# Patient Record
Sex: Female | Born: 1945
Health system: Southern US, Community
[De-identification: ages and names within clinical notes are randomized; demographics above are authoritative.]

## PROBLEM LIST (undated history)

## (undated) DIAGNOSIS — F32A Depression, unspecified: Secondary | ICD-10-CM

## (undated) DIAGNOSIS — H269 Unspecified cataract: Secondary | ICD-10-CM

## (undated) DIAGNOSIS — F419 Anxiety disorder, unspecified: Secondary | ICD-10-CM

## (undated) DIAGNOSIS — J45909 Unspecified asthma, uncomplicated: Secondary | ICD-10-CM

## (undated) DIAGNOSIS — F329 Major depressive disorder, single episode, unspecified: Secondary | ICD-10-CM

## (undated) DIAGNOSIS — G709 Myoneural disorder, unspecified: Secondary | ICD-10-CM

## (undated) DIAGNOSIS — I10 Essential (primary) hypertension: Secondary | ICD-10-CM

## (undated) DIAGNOSIS — Z8619 Personal history of other infectious and parasitic diseases: Secondary | ICD-10-CM

## (undated) DIAGNOSIS — T7840XA Allergy, unspecified, initial encounter: Secondary | ICD-10-CM

## (undated) DIAGNOSIS — C801 Malignant (primary) neoplasm, unspecified: Secondary | ICD-10-CM

## (undated) DIAGNOSIS — Z87440 Personal history of urinary (tract) infections: Secondary | ICD-10-CM

## (undated) DIAGNOSIS — E785 Hyperlipidemia, unspecified: Secondary | ICD-10-CM

## (undated) DIAGNOSIS — G4730 Sleep apnea, unspecified: Secondary | ICD-10-CM

## (undated) DIAGNOSIS — K219 Gastro-esophageal reflux disease without esophagitis: Secondary | ICD-10-CM

## (undated) HISTORY — DX: Depression, unspecified: F32.A

## (undated) HISTORY — DX: Personal history of urinary (tract) infections: Z87.440

## (undated) HISTORY — DX: Gastro-esophageal reflux disease without esophagitis: K21.9

## (undated) HISTORY — DX: Anxiety disorder, unspecified: F41.9

## (undated) HISTORY — PX: TUMOR REMOVAL: SHX12

## (undated) HISTORY — DX: Personal history of other infectious and parasitic diseases: Z86.19

## (undated) HISTORY — DX: Major depressive disorder, single episode, unspecified: F32.9

## (undated) HISTORY — PX: TONSILLECTOMY: SUR1361

## (undated) HISTORY — DX: Myoneural disorder, unspecified: G70.9

## (undated) HISTORY — DX: Essential (primary) hypertension: I10

## (undated) HISTORY — DX: Malignant (primary) neoplasm, unspecified: C80.1

## (undated) HISTORY — DX: Allergy, unspecified, initial encounter: T78.40XA

## (undated) HISTORY — DX: Unspecified asthma, uncomplicated: J45.909

## (undated) HISTORY — DX: Unspecified cataract: H26.9

## (undated) MED FILL — Iron Sucrose Inj 20 MG/ML (Fe Equiv): INTRAVENOUS | Qty: 10 | Status: AC

---

## 2004-03-07 ENCOUNTER — Ambulatory Visit: Payer: Self-pay | Admitting: Internal Medicine

## 2004-03-10 ENCOUNTER — Ambulatory Visit: Payer: Self-pay | Admitting: Internal Medicine

## 2004-11-21 ENCOUNTER — Ambulatory Visit: Payer: Self-pay | Admitting: Internal Medicine

## 2005-02-12 ENCOUNTER — Emergency Department: Payer: Self-pay | Admitting: Internal Medicine

## 2005-04-19 ENCOUNTER — Ambulatory Visit: Payer: Self-pay | Admitting: Internal Medicine

## 2005-08-28 ENCOUNTER — Ambulatory Visit: Payer: Self-pay | Admitting: Urology

## 2007-01-17 ENCOUNTER — Ambulatory Visit: Payer: Self-pay | Admitting: Internal Medicine

## 2007-01-28 ENCOUNTER — Ambulatory Visit: Payer: Self-pay | Admitting: Gastroenterology

## 2007-07-16 ENCOUNTER — Ambulatory Visit: Payer: Self-pay | Admitting: Urology

## 2007-08-27 ENCOUNTER — Ambulatory Visit: Payer: Self-pay | Admitting: Urology

## 2010-08-31 ENCOUNTER — Emergency Department: Payer: Self-pay | Admitting: Emergency Medicine

## 2010-08-31 LAB — HM PAP SMEAR: HM Pap smear: NEGATIVE

## 2010-09-09 ENCOUNTER — Ambulatory Visit: Payer: Self-pay | Admitting: Internal Medicine

## 2010-09-15 ENCOUNTER — Ambulatory Visit: Payer: Self-pay | Admitting: Internal Medicine

## 2011-10-04 ENCOUNTER — Ambulatory Visit: Payer: Self-pay | Admitting: Internal Medicine

## 2011-10-04 LAB — HM MAMMOGRAPHY

## 2012-12-13 ENCOUNTER — Encounter: Payer: Self-pay | Admitting: Internal Medicine

## 2012-12-13 ENCOUNTER — Ambulatory Visit (INDEPENDENT_AMBULATORY_CARE_PROVIDER_SITE_OTHER): Payer: 59 | Admitting: Internal Medicine

## 2012-12-13 VITALS — BP 110/70 | HR 92 | Temp 97.9°F | Ht 68.0 in | Wt 216.5 lb

## 2012-12-13 DIAGNOSIS — Z9109 Other allergy status, other than to drugs and biological substances: Secondary | ICD-10-CM

## 2012-12-13 DIAGNOSIS — N390 Urinary tract infection, site not specified: Secondary | ICD-10-CM

## 2012-12-13 DIAGNOSIS — F329 Major depressive disorder, single episode, unspecified: Secondary | ICD-10-CM

## 2012-12-13 DIAGNOSIS — I10 Essential (primary) hypertension: Secondary | ICD-10-CM

## 2012-12-13 MED ORDER — LISINOPRIL-HYDROCHLOROTHIAZIDE 10-12.5 MG PO TABS: 1.0000 | ORAL_TABLET | Freq: Every day | ORAL | Status: DC

## 2012-12-13 MED ORDER — SERTRALINE HCL 25 MG PO TABS: 25.0000 mg | ORAL_TABLET | Freq: Every day | ORAL | Status: DC

## 2012-12-13 MED ORDER — ESTRADIOL 0.1 MG/GM VA CREA: TOPICAL_CREAM | VAGINAL | Status: DC

## 2012-12-15 ENCOUNTER — Encounter: Payer: Self-pay | Admitting: Internal Medicine

## 2012-12-15 DIAGNOSIS — N390 Urinary tract infection, site not specified: Secondary | ICD-10-CM | POA: Insufficient documentation

## 2012-12-15 DIAGNOSIS — I10 Essential (primary) hypertension: Secondary | ICD-10-CM | POA: Insufficient documentation

## 2012-12-15 DIAGNOSIS — Z9109 Other allergy status, other than to drugs and biological substances: Secondary | ICD-10-CM | POA: Insufficient documentation

## 2012-12-15 DIAGNOSIS — F329 Major depressive disorder, single episode, unspecified: Secondary | ICD-10-CM | POA: Insufficient documentation

## 2012-12-15 LAB — BASIC METABOLIC PANEL
BUN/Creatinine Ratio: 17 (ref 11–26)
Calcium: 9.7 mg/dL (ref 8.6–10.2)
Creatinine, Ser: 0.93 mg/dL (ref 0.57–1.00)
GFR calc non Af Amer: 64 mL/min/{1.73_m2} (ref >59)
Sodium: 145 mmol/L — ABNORMAL HIGH (ref 134–144)

## 2012-12-15 NOTE — Assessment & Plan Note (Signed)
Blood pressure under good control on current regimen.  Check metabolic panel.

## 2012-12-15 NOTE — Assessment & Plan Note (Signed)
Stable

## 2012-12-15 NOTE — Progress Notes (Signed)
  Subjective:    [name], female    DOB: Feb 03, 1946, 67 y.o.   MRN: [id number]  HPI 67 year old female with past history of hypertension and depression who comes in today to follow up on these issues as well as to transfer her care here to [facility]  She states overall she is doing relatively well.  Has been off her zoloft for approximately 6 months.   Feels she did better on the zoloft. Has also been out of her estrogen cream.  States, while using the cream she did not have the frequent urinary tract infections.  Now that she is off, has had some reoccurring uti's.  Is on cipro now for uti.  No acute symptoms now (reported).  Breathing stable.  Overall she feels she is doing relatively well.     Past Medical History  Diagnosis Date  . Depression   . Allergy   . Hypertension   . History of chicken pox   . Hx: UTI (urinary tract infection)     Review of Systems Patient denies any headache, lightheadedness or dizziness. No sinus or allergy symptoms.   No chest pain, tightness or palpitations.  No increased shortness of breath, cough or congestion.  No nausea or vomiting.  No acid reflux.  No abdominal pain or cramping.  No bowel change, such as diarrhea, constipation, BRBPR or melana.  No urine change.  Feels she needs to be back on her zoloft.  Also with reoccurring uti's.  Currently being treated.       Objective:   Physical Exam Filed Vitals:   12/13/12 1420  BP: 110/70  Pulse: 92  Temp: 97.9 F (61.55 C)   68 year old female in no acute distress.   HEENT:  Nares- clear.  Oropharynx - without lesions. NECK:  Supple.  Nontender.  No audible bruit.  HEART:  Appears to be regular. LUNGS:  No crackles or wheezing audible.  Respirations even and unlabored.  RADIAL PULSE:  Equal bilaterally.  ABDOMEN:  Soft, nontender.  Bowel sounds present and normal.  No audible abdominal bruit.   EXTREMITIES:  No increased edema present.  DP pulses palpable and equal bilaterally.           Assessment & Plan:  HEALTH MAINTENANCE.  Schedule her for a physical next visit.  Schedule mammogram when due.  Obtain outside records for review.  Cholesterol just checked 11/19/12 revealed total cholesterol 170, triglycerides 86, HDL 52 and LDL 101.

## 2012-12-15 NOTE — Assessment & Plan Note (Signed)
Restart zoloft.  Follow.  Did well previously.

## 2012-12-15 NOTE — Assessment & Plan Note (Signed)
On cipro now.  Restart estrace cream.   Follow.

## 2012-12-17 ENCOUNTER — Encounter: Payer: Self-pay | Admitting: *Deleted

## 2012-12-19 ENCOUNTER — Encounter: Payer: Self-pay | Admitting: Internal Medicine

## 2012-12-20 ENCOUNTER — Other Ambulatory Visit: Payer: Self-pay | Admitting: *Deleted

## 2013-01-27 ENCOUNTER — Encounter: Payer: Self-pay | Admitting: Internal Medicine

## 2013-01-29 ENCOUNTER — Ambulatory Visit: Payer: Self-pay | Admitting: Internal Medicine

## 2013-01-29 LAB — HM MAMMOGRAPHY

## 2013-02-07 ENCOUNTER — Encounter: Payer: Self-pay | Admitting: Internal Medicine

## 2013-02-08 ENCOUNTER — Other Ambulatory Visit: Payer: Self-pay | Admitting: Internal Medicine

## 2013-02-10 NOTE — Telephone Encounter (Signed)
Rx was sent to [facility] on 12/13/12- #90 with 1 refill

## 2013-02-24 ENCOUNTER — Encounter: Payer: Self-pay | Admitting: Internal Medicine

## 2013-02-24 ENCOUNTER — Ambulatory Visit (INDEPENDENT_AMBULATORY_CARE_PROVIDER_SITE_OTHER): Payer: 59 | Admitting: Internal Medicine

## 2013-02-24 VITALS — BP 110/70 | HR 79 | Temp 97.9°F | Wt 215.5 lb

## 2013-02-24 DIAGNOSIS — I10 Essential (primary) hypertension: Secondary | ICD-10-CM

## 2013-02-24 DIAGNOSIS — M25569 Pain in unspecified knee: Secondary | ICD-10-CM

## 2013-02-24 DIAGNOSIS — M7989 Other specified soft tissue disorders: Secondary | ICD-10-CM

## 2013-02-24 DIAGNOSIS — Z9109 Other allergy status, other than to drugs and biological substances: Secondary | ICD-10-CM

## 2013-02-24 DIAGNOSIS — N390 Urinary tract infection, site not specified: Secondary | ICD-10-CM

## 2013-02-24 DIAGNOSIS — F329 Major depressive disorder, single episode, unspecified: Secondary | ICD-10-CM

## 2013-02-24 DIAGNOSIS — M25562 Pain in left knee: Secondary | ICD-10-CM

## 2013-02-24 DIAGNOSIS — Z23 Encounter for immunization: Secondary | ICD-10-CM

## 2013-03-01 ENCOUNTER — Encounter: Payer: Self-pay | Admitting: Internal Medicine

## 2013-03-01 DIAGNOSIS — M7989 Other specified soft tissue disorders: Secondary | ICD-10-CM | POA: Insufficient documentation

## 2013-03-01 DIAGNOSIS — M25562 Pain in left knee: Secondary | ICD-10-CM | POA: Insufficient documentation

## 2013-03-01 NOTE — Assessment & Plan Note (Signed)
Better now.  Follow.  

## 2013-03-01 NOTE — Assessment & Plan Note (Signed)
On estrace cream.  This helps decrease her uti's.  Currently asymptomatic.

## 2013-03-01 NOTE — Progress Notes (Signed)
  Subjective:    [name], female    DOB: 1946/03/31, 67 y.o.   MRN: [id number]  HPI 67 year old female with past history of hypertension and depression who comes in today to follow up on these issues as well as for a complete physical exam.  She states overall she is doing relatively well.  Breathing stable.  No chest pain or tightness.  No acid reflux.  She noticed some bilateral leg swelling previously.  Better now.  Worse after she has been standing for awhile.  Has noticed some intermittent left knee pain.  Better now.  Discussed support hose.  Bowels stable.     Past Medical History  Diagnosis Date  . Depression   . Allergy   . Hypertension   . History of chicken pox   . Hx: UTI (urinary tract infection)     Current Outpatient Prescriptions on File Prior to Visit  Medication Sig Dispense Refill  . estradiol (ESTRACE) 0.1 MG/GM vaginal cream Insert twice a week  42.5 g  1  . lisinopril-hydrochlorothiazide (PRINZIDE,ZESTORETIC) 10-12.5 MG per tablet Take 1 tablet by mouth daily.  90 tablet  1  . sertraline (ZOLOFT) 25 MG tablet Take 1 tablet (25 mg total) by mouth daily.  90 tablet  1   No current facility-administered medications on file prior to visit.    Review of Systems Patient denies any headache, lightheadedness or dizziness. No sinus or allergy symptoms.   No chest pain, tightness or palpitations.  No increased shortness of breath, cough or congestion.  No nausea or vomiting.  No acid reflux.  No abdominal pain or cramping.  No bowel change, such as diarrhea, constipation, BRBPR or melana.  No urine change.  Back on her zoloft and doing well with this.  Some leg swelling - bilateral lower extremities.         Objective:   Physical Exam  Filed Vitals:   02/24/13 1549  BP: 110/70  Pulse: 79  Temp: 97.9 F (54.61 C)   67 year old female in no acute distress.   HEENT:  Nares- clear.  Oropharynx - without lesions. NECK:  Supple.  Nontender.  No audible  bruit.  HEART:  Appears to be regular. LUNGS:  No crackles or wheezing audible.  Respirations even and unlabored.  RADIAL PULSE:  Equal bilaterally.    BREASTS:  No nipple discharge or nipple retraction present.  Could not appreciate any distinct nodules or axillary adenopathy.  ABDOMEN:  Soft, nontender.  Bowel sounds present and normal.  No audible abdominal bruit.  GU: not performed.   EXTREMITIES:  No increased edema present.  DP pulses palpable and equal bilaterally.          Assessment & Plan:  HEALTH MAINTENANCE.  Physical today.  Pap 08/31/10 - negative with negative HPV.   Cholesterol last checked 11/19/12 revealed total cholesterol 170, triglycerides 86, HDL 52 and LDL 101.   Mammogram 01/29/13 - Birads I.

## 2013-03-01 NOTE — Assessment & Plan Note (Signed)
Blood pressure under good control on current regimen.  Follow metabolic panel.  

## 2013-03-01 NOTE — Assessment & Plan Note (Signed)
Bilateral lower extremity swelling.  Worse as the day progresses.  Overall better now.  Compression hose.  Follow.

## 2013-03-01 NOTE — Assessment & Plan Note (Signed)
On zoloft and doing well.  Follow.   

## 2013-03-01 NOTE — Assessment & Plan Note (Signed)
Stable

## 2013-05-13 ENCOUNTER — Ambulatory Visit (INDEPENDENT_AMBULATORY_CARE_PROVIDER_SITE_OTHER): Payer: 59 | Admitting: Internal Medicine

## 2013-05-13 ENCOUNTER — Encounter: Payer: Self-pay | Admitting: Internal Medicine

## 2013-05-13 VITALS — BP 120/80 | HR 94 | Temp 98.0°F | Ht 68.0 in | Wt 216.5 lb

## 2013-05-13 DIAGNOSIS — R05 Cough: Secondary | ICD-10-CM

## 2013-05-13 DIAGNOSIS — I10 Essential (primary) hypertension: Secondary | ICD-10-CM

## 2013-05-13 DIAGNOSIS — R059 Cough, unspecified: Secondary | ICD-10-CM

## 2013-05-13 DIAGNOSIS — K219 Gastro-esophageal reflux disease without esophagitis: Secondary | ICD-10-CM

## 2013-05-13 MED ORDER — RANITIDINE HCL 150 MG/10ML PO SYRP: 150.0000 mg | ORAL_SOLUTION | Freq: Two times a day (BID) | ORAL | Status: DC

## 2013-05-13 NOTE — Progress Notes (Signed)
Pre-visit discussion using our clinic review tool. No additional management support is needed unless otherwise documented below in the visit note.  

## 2013-05-18 ENCOUNTER — Encounter: Payer: Self-pay | Admitting: Internal Medicine

## 2013-05-18 DIAGNOSIS — R059 Cough, unspecified: Secondary | ICD-10-CM | POA: Insufficient documentation

## 2013-05-18 DIAGNOSIS — R05 Cough: Secondary | ICD-10-CM | POA: Insufficient documentation

## 2013-05-18 DIAGNOSIS — K219 Gastro-esophageal reflux disease without esophagitis: Secondary | ICD-10-CM | POA: Insufficient documentation

## 2013-05-18 NOTE — Progress Notes (Signed)
  Subjective:    [name], female    DOB: 1945/09/01, 68 y.o.   MRN: [id number]  Cough  68 year old female with past history of hypertension and depression who comes in today as a work in with concerns regarding increased cough.    She states overall has been doing relatively well.  Was diagnosed with bronchitis 6 weeks ago.  Was placed on amoxicillin.  Still with cough.  Feels better.  Minimal stuffiness.  No significant drainage.  Cough persistent.  Seldom productive.  Taking mucinex prn.  Does report increased acid reflux.  No vomiting.  Eating and drinking.    Past Medical History  Diagnosis Date  . Depression   . Allergy   . Hypertension   . History of chicken pox   . Hx: UTI (urinary tract infection)     Current Outpatient Prescriptions on File Prior to Visit  Medication Sig Dispense Refill  . estradiol (ESTRACE) 0.1 MG/GM vaginal cream Insert twice a week  42.5 g  1  . lisinopril-hydrochlorothiazide (PRINZIDE,ZESTORETIC) 10-12.5 MG per tablet Take 1 tablet by mouth daily.  90 tablet  1  . sertraline (ZOLOFT) 25 MG tablet Take 1 tablet (25 mg total) by mouth daily.  90 tablet  1   No current facility-administered medications on file prior to visit.    Review of Systems  Respiratory: Positive for cough.   Patient denies any headache, lightheadedness or dizziness.  Minimal nasal stuffiness.  No chest pain, tightness or palpitations.  No increased shortness of breath.  Does report some cough - mostly non productive.  No nausea or vomiting.  Acid reflux as outlined.   No abdominal pain or cramping.  No bowel change, such as diarrhea - reported.        Objective:   Physical Exam  Filed Vitals:   05/13/13 1529  BP: 120/80  Pulse: 94  Temp: 98 F (4036.707 C)   68 year old female in no acute distress.   HEENT:  Nares- clear.  Oropharynx - without lesions. NECK:  Supple.  Nontender.  No audible bruit.  HEART:  Appears to be regular. LUNGS:  No crackles or  wheezing audible.  Respirations even and unlabored.         Assessment & Plan:  HEALTH MAINTENANCE.  Physical 02/24/13.   Pap 08/31/10 - negative with negative HPV.   Cholesterol last checked 11/19/12 revealed total cholesterol 170, triglycerides 86, HDL 52 and LDL 101.   Mammogram 01/29/13 - Birads I.

## 2013-05-18 NOTE — Assessment & Plan Note (Signed)
Blood pressure under good control on current regimen.  Follow metabolic panel.  

## 2013-05-18 NOTE — Assessment & Plan Note (Signed)
Describes a persistent mostly dry cough.  May just be some residual cough from previous infection.  Possible allergy component.  Treat with saline nasal spray and Flonase as directed.  Also treat acid reflux as outlined.  Get her back in soon to reassess.  Lungs clear.  Hold on cxr.

## 2013-05-18 NOTE — Assessment & Plan Note (Signed)
Acid reflux as outlined.  I feel this may be contributing to the cough.  She is unable to swallow pills and request liquid form of the medication.  Will prescribe zantac suspension as directed.  Get her back in soon to reassess.

## 2013-05-29 ENCOUNTER — Ambulatory Visit: Payer: 59 | Admitting: Adult Health

## 2013-06-19 ENCOUNTER — Encounter: Payer: Self-pay | Admitting: Internal Medicine

## 2013-06-19 ENCOUNTER — Ambulatory Visit (INDEPENDENT_AMBULATORY_CARE_PROVIDER_SITE_OTHER): Payer: 59 | Admitting: Internal Medicine

## 2013-06-19 VITALS — BP 130/80 | HR 89 | Temp 97.6°F | Ht 68.0 in | Wt 211.5 lb

## 2013-06-19 DIAGNOSIS — R42 Dizziness and giddiness: Secondary | ICD-10-CM

## 2013-06-19 DIAGNOSIS — F3289 Other specified depressive episodes: Secondary | ICD-10-CM

## 2013-06-19 DIAGNOSIS — F32A Depression, unspecified: Secondary | ICD-10-CM

## 2013-06-19 DIAGNOSIS — Z9109 Other allergy status, other than to drugs and biological substances: Secondary | ICD-10-CM

## 2013-06-19 DIAGNOSIS — Z13220 Encounter for screening for lipoid disorders: Secondary | ICD-10-CM

## 2013-06-19 DIAGNOSIS — K219 Gastro-esophageal reflux disease without esophagitis: Secondary | ICD-10-CM

## 2013-06-19 DIAGNOSIS — M7989 Other specified soft tissue disorders: Secondary | ICD-10-CM

## 2013-06-19 DIAGNOSIS — I10 Essential (primary) hypertension: Secondary | ICD-10-CM

## 2013-06-19 DIAGNOSIS — F329 Major depressive disorder, single episode, unspecified: Secondary | ICD-10-CM

## 2013-06-19 MED ORDER — LANSOPRAZOLE 30 MG PO TBDP: 30.0000 mg | ORAL_TABLET | Freq: Every day | ORAL | Status: DC

## 2013-06-19 NOTE — Assessment & Plan Note (Addendum)
Blood pressure under good control on current regimen.  Follow metabolic panel.  

## 2013-06-19 NOTE — Assessment & Plan Note (Addendum)
On zoloft and doing well.  Follow.   

## 2013-06-19 NOTE — Progress Notes (Signed)
Pre-visit discussion using our clinic review tool. No additional management support is needed unless otherwise documented below in the visit note.  

## 2013-06-19 NOTE — Progress Notes (Signed)
  Subjective:    [name], female    DOB: March 14, 1946, 68 y.o.   MRN: [id number]  HPI 68 year old female with past history of hypertension and depression who comes in today for a scheduled follow up.  She states overall she is doing relatively well.  Breathing stable.  No chest pain or tightness.  Was having increased acid reflux.  Was started on ranitidine.  Symptoms improved but still present.  Also, if she skipped her medicine - would have a flare.  No abdominal pain or cramping.   Bowels stable.  Some occasional light headedness.  Some allergy symptoms.    Past Medical History  Diagnosis Date  . Depression   . Allergy   . Hypertension   . History of chicken pox   . Hx: UTI (urinary tract infection)     Current Outpatient Prescriptions on File Prior to Visit  Medication Sig Dispense Refill  . estradiol (ESTRACE) 0.1 MG/GM vaginal cream Insert twice a week  42.5 g  1  . lisinopril-hydrochlorothiazide (PRINZIDE,ZESTORETIC) 10-12.5 MG per tablet Take 1 tablet by mouth daily.  90 tablet  1  . ranitidine (ZANTAC) 150 MG/10ML syrup Take 10 mLs (150 mg total) by mouth 2 (two) times daily.  300 mL  0  . sertraline (ZOLOFT) 25 MG tablet Take 1 tablet (25 mg total) by mouth daily.  90 tablet  1   No current facility-administered medications on file prior to visit.    Review of Systems Patient denies any headache.  Some light headedness.  Minimal.  See above.  No chest pain, tightness or palpitations.  No increased shortness of breath, cough or congestion.  No nausea or vomiting.  Acid reflux as outlined.   No abdominal pain or cramping.  No bowel change, such as diarrhea, constipation, BRBPR or melana.  No urine change.  Back on her zoloft and doing well with this.         Objective:   Physical Exam  Filed Vitals:   06/19/13 1531  BP: 130/80  Pulse: 89  Temp: 97.6 F (36.4 C)   Blood pressure recheck:  31128/6378 68 year old female in no acute distress.   HEENT:  Nares-  clear.  Oropharynx - without lesions. NECK:  Supple.  Nontender.  No audible bruit.  HEART:  Appears to be regular. LUNGS:  No crackles or wheezing audible.  Respirations even and unlabored.  RADIAL PULSE:  Equal bilaterally.  ABDOMEN:  Soft, nontender.  Bowel sounds present and normal.  No audible abdominal bruit.  EXTREMITIES:  No increased edema present.  DP pulses palpable and equal bilaterally.          Assessment & Plan:  HEALTH MAINTENANCE.  Physical 02/24/13.   Pap 08/31/10 - negative with negative HPV.   Cholesterol last checked 11/19/12 revealed total cholesterol 170, triglycerides 86, HDL 52 and LDL 101.   Mammogram 01/29/13 - Birads I.

## 2013-06-19 NOTE — Assessment & Plan Note (Addendum)
Acid reflux as outlined.  Cough better.   She is unable to swallow pills.  Will start prevacid solutabs.   Ranitidine in the evening.  Follow.  Discussed possible need for EGD if symptoms persist.

## 2013-06-22 ENCOUNTER — Encounter: Payer: Self-pay | Admitting: Internal Medicine

## 2013-06-22 DIAGNOSIS — R42 Dizziness and giddiness: Secondary | ICD-10-CM | POA: Insufficient documentation

## 2013-06-22 NOTE — Assessment & Plan Note (Addendum)
Start zyrtec suspension.

## 2013-06-22 NOTE — Assessment & Plan Note (Signed)
Will treat allergies.  Follow.  Blood pressure good.  Will hold on further w/up.  She is in agreement.  Follow.  If persistent, will require further w/up.

## 2013-06-22 NOTE — Assessment & Plan Note (Signed)
Better.  Follow.  

## 2013-06-30 ENCOUNTER — Encounter: Payer: Self-pay | Admitting: Internal Medicine

## 2013-06-30 ENCOUNTER — Telehealth: Payer: Self-pay | Admitting: *Deleted

## 2013-06-30 NOTE — Telephone Encounter (Signed)
Received PA request form for the Prevacid 30 mg, form placed in Dr.scott folder

## 2013-07-02 NOTE — Telephone Encounter (Signed)
PA Request Form completed & faxed to [phone]

## 2013-07-07 ENCOUNTER — Other Ambulatory Visit: Payer: Self-pay | Admitting: *Deleted

## 2013-07-07 MED ORDER — LISINOPRIL-HYDROCHLOROTHIAZIDE 10-12.5 MG PO TABS: 1.0000 | ORAL_TABLET | Freq: Every day | ORAL | Status: DC

## 2013-07-07 MED ORDER — SERTRALINE HCL 25 MG PO TABS: 25.0000 mg | ORAL_TABLET | Freq: Every day | ORAL | Status: DC

## 2013-07-08 ENCOUNTER — Other Ambulatory Visit: Payer: Self-pay | Admitting: *Deleted

## 2013-08-11 ENCOUNTER — Other Ambulatory Visit (INDEPENDENT_AMBULATORY_CARE_PROVIDER_SITE_OTHER): Payer: 59

## 2013-08-11 ENCOUNTER — Encounter: Payer: Self-pay | Admitting: Internal Medicine

## 2013-08-11 DIAGNOSIS — F3289 Other specified depressive episodes: Secondary | ICD-10-CM

## 2013-08-11 DIAGNOSIS — Z13220 Encounter for screening for lipoid disorders: Secondary | ICD-10-CM

## 2013-08-11 DIAGNOSIS — F32A Depression, unspecified: Secondary | ICD-10-CM

## 2013-08-11 DIAGNOSIS — I10 Essential (primary) hypertension: Secondary | ICD-10-CM

## 2013-08-11 DIAGNOSIS — R42 Dizziness and giddiness: Secondary | ICD-10-CM

## 2013-08-11 DIAGNOSIS — F329 Major depressive disorder, single episode, unspecified: Secondary | ICD-10-CM

## 2013-08-11 LAB — COMPREHENSIVE METABOLIC PANEL WITH GFR
ALT: 14 U/L (ref 0–35)
AST: 21 U/L (ref 0–37)
Albumin: 4 g/dL (ref 3.5–5.2)
Alkaline Phosphatase: 58 U/L (ref 39–117)
BUN: 11 mg/dL (ref 6–23)
CO2: 28 meq/L (ref 19–32)
Calcium: 10 mg/dL (ref 8.4–10.5)
Chloride: 106 meq/L (ref 96–112)
Creatinine, Ser: 0.9 mg/dL (ref 0.4–1.2)
GFR: 70.81 mL/min (ref >60.00)
Glucose, Bld: 99 mg/dL (ref 70–99)
Potassium: 4.4 meq/L (ref 3.5–5.1)
Sodium: 142 meq/L (ref 135–145)
Total Bilirubin: 0.4 mg/dL (ref 0.3–1.2)
Total Protein: 7.1 g/dL (ref 6.0–8.3)

## 2013-08-11 LAB — CBC WITH DIFFERENTIAL/PLATELET
Basophils Absolute: 0 K/uL (ref 0.0–0.1)
Basophils Relative: 0.3 % (ref 0.0–3.0)
Eosinophils Absolute: 0.2 K/uL (ref 0.0–0.7)
Eosinophils Relative: 3 % (ref 0.0–5.0)
HCT: 36.7 % (ref 36.0–46.0)
Hemoglobin: 12.4 g/dL (ref 12.0–15.0)
Lymphocytes Relative: 44.3 % (ref 12.0–46.0)
Lymphs Abs: 2.4 K/uL (ref 0.7–4.0)
MCHC: 33.6 g/dL (ref 30.0–36.0)
MCV: 91 fl (ref 78.0–100.0)
Monocytes Absolute: 0.4 K/uL (ref 0.1–1.0)
Monocytes Relative: 7.9 % (ref 3.0–12.0)
Neutro Abs: 2.4 K/uL (ref 1.4–7.7)
Neutrophils Relative %: 44.5 % (ref 43.0–77.0)
Platelets: 239 K/uL (ref 150.0–400.0)
RBC: 4.04 Mil/uL (ref 3.87–5.11)
RDW: 13.2 % (ref 11.5–14.6)
WBC: 5.4 K/uL (ref 4.5–10.5)

## 2013-08-11 LAB — LIPID PANEL
CHOLESTEROL: 182 mg/dL (ref 0–200)
HDL: 45.4 mg/dL (ref >39.00)
LDL Cholesterol: 119 mg/dL — ABNORMAL HIGH (ref 0–99)
TRIGLYCERIDES: 89 mg/dL (ref 0.0–149.0)
Total CHOL/HDL Ratio: 4
VLDL: 17.8 mg/dL (ref 0.0–40.0)

## 2013-08-11 LAB — TSH: TSH: 2.38 u[IU]/mL (ref 0.35–5.50)

## 2013-08-12 ENCOUNTER — Other Ambulatory Visit: Payer: 59

## 2013-08-13 NOTE — Telephone Encounter (Signed)
Unread mychart message mailed  

## 2013-08-15 ENCOUNTER — Encounter: Payer: Self-pay | Admitting: Internal Medicine

## 2013-08-15 ENCOUNTER — Ambulatory Visit (INDEPENDENT_AMBULATORY_CARE_PROVIDER_SITE_OTHER): Payer: 59 | Admitting: Internal Medicine

## 2013-08-15 VITALS — BP 134/82 | HR 81 | Temp 98.0°F | Wt 208.5 lb

## 2013-08-15 DIAGNOSIS — F329 Major depressive disorder, single episode, unspecified: Secondary | ICD-10-CM

## 2013-08-15 DIAGNOSIS — R42 Dizziness and giddiness: Secondary | ICD-10-CM

## 2013-08-15 DIAGNOSIS — Z9109 Other allergy status, other than to drugs and biological substances: Secondary | ICD-10-CM

## 2013-08-15 DIAGNOSIS — K219 Gastro-esophageal reflux disease without esophagitis: Secondary | ICD-10-CM

## 2013-08-15 DIAGNOSIS — F3289 Other specified depressive episodes: Secondary | ICD-10-CM

## 2013-08-15 DIAGNOSIS — Z13220 Encounter for screening for lipoid disorders: Secondary | ICD-10-CM

## 2013-08-15 DIAGNOSIS — I10 Essential (primary) hypertension: Secondary | ICD-10-CM

## 2013-08-15 DIAGNOSIS — F32A Depression, unspecified: Secondary | ICD-10-CM

## 2013-08-15 MED ORDER — RANITIDINE HCL 150 MG/10ML PO SYRP: 150.0000 mg | ORAL_SOLUTION | Freq: Two times a day (BID) | ORAL | Status: DC

## 2013-08-15 NOTE — Progress Notes (Signed)
  Subjective:    [name], female    DOB: 23-Sep-1945, 68 y.o.   MRN: [id number]  HPI 68 year old female with past history of hypertension and depression who comes in today for a scheduled follow up.  She states overall she is doing relatively well.  Breathing stable.  No chest pain or tightness.  Was having increased acid reflux.  Was started on ranitidine.  Symptoms improved but still present.  Insurance would not cover the solutabs.  No abdominal pain or cramping.   Bowels stable.     Past Medical History  Diagnosis Date  . Depression   . Allergy   . Hypertension   . History of chicken pox   . Hx: UTI (urinary tract infection)     Current Outpatient Prescriptions on File Prior to Visit  Medication Sig Dispense Refill  . estradiol (ESTRACE) 0.1 MG/GM vaginal cream Insert twice a week  42.5 g  1  . lisinopril-hydrochlorothiazide (PRINZIDE,ZESTORETIC) 10-12.5 MG per tablet Take 1 tablet by mouth daily.  90 tablet  1  . ranitidine (ZANTAC) 150 MG/10ML syrup Take 10 mLs (150 mg total) by mouth 2 (two) times daily.  300 mL  0  . sertraline (ZOLOFT) 25 MG tablet Take 1 tablet (25 mg total) by mouth daily.  90 tablet  0  . lansoprazole (PREVACID SOLUTAB) 30 MG disintegrating tablet Take 1 tablet (30 mg total) by mouth daily.  30 tablet  1   No current facility-administered medications on file prior to visit.    Review of Systems Patient denies any headache.  No light headedness or dizziness reported.  No chest pain, tightness or palpitations.  No increased shortness of breath, cough or congestion.  No nausea or vomiting.  Acid reflux as outlined.   No abdominal pain or cramping.  No bowel change, such as diarrhea, constipation, BRBPR or melana.  No urine change.  Back on her zoloft and doing well with this.         Objective:   Physical Exam  Filed Vitals:   08/15/13 1444  BP: 134/82  Pulse: 81  Temp: 98 F (6936.17 C)   68 year old female in no acute distress.    HEENT:  Nares- clear.  Oropharynx - without lesions. NECK:  Supple.  Nontender.  No audible bruit.  HEART:  Appears to be regular. LUNGS:  No crackles or wheezing audible.  Respirations even and unlabored.  RADIAL PULSE:  Equal bilaterally.  ABDOMEN:  Soft, nontender.  Bowel sounds present and normal.  No audible abdominal bruit.  EXTREMITIES:  No increased edema present.  DP pulses palpable and equal bilaterally.          Assessment & Plan:  HEALTH MAINTENANCE.  Physical 02/24/13.   Pap 08/31/10 - negative with negative HPV.   Cholesterol last checked 11/19/12 revealed total cholesterol 170, triglycerides 86, HDL 52 and LDL 101.   Mammogram 01/29/13 - Birads I.

## 2013-08-15 NOTE — Progress Notes (Signed)
Pre visit review using our clinic review tool, if applicable. No additional management support is needed unless otherwise documented below in the visit note. 

## 2013-08-15 NOTE — Assessment & Plan Note (Addendum)
Acid reflux as outlined.  She is unable to swallow pills and request liquid form of the medication.  Zantac suspension as directed.  Will increase to bid.  Will notify me if symptoms persist.  Discussed the need for GI evaluation if persistent.

## 2013-08-18 ENCOUNTER — Encounter: Payer: Self-pay | Admitting: Internal Medicine

## 2013-08-18 NOTE — Assessment & Plan Note (Signed)
On zoloft and doing well.  Follow.   

## 2013-08-18 NOTE — Assessment & Plan Note (Signed)
Blood pressure under good control on current regimen.  Follow metabolic panel.

## 2013-08-18 NOTE — Assessment & Plan Note (Signed)
Resolved

## 2013-08-18 NOTE — Assessment & Plan Note (Signed)
Zyrtec as needed 

## 2013-09-04 ENCOUNTER — Other Ambulatory Visit: Payer: Self-pay | Admitting: Internal Medicine

## 2013-11-06 ENCOUNTER — Other Ambulatory Visit: Payer: Self-pay | Admitting: Internal Medicine

## 2013-11-11 ENCOUNTER — Ambulatory Visit (INDEPENDENT_AMBULATORY_CARE_PROVIDER_SITE_OTHER): Payer: 59 | Admitting: Internal Medicine

## 2013-11-11 ENCOUNTER — Encounter: Payer: Self-pay | Admitting: Internal Medicine

## 2013-11-11 VITALS — BP 130/80 | HR 97 | Temp 97.8°F | Ht 68.0 in | Wt 210.0 lb

## 2013-11-11 DIAGNOSIS — B029 Zoster without complications: Secondary | ICD-10-CM

## 2013-11-11 MED ORDER — TRIAMCINOLONE ACETONIDE 0.1 % EX CREA: 1.0000 "application " | TOPICAL_CREAM | Freq: Two times a day (BID) | CUTANEOUS | Status: DC

## 2013-11-11 NOTE — Progress Notes (Signed)
Pre visit review using our clinic review tool, if applicable. No additional management support is needed unless otherwise documented below in the visit note. 

## 2013-11-13 ENCOUNTER — Telehealth: Payer: Self-pay | Admitting: Internal Medicine

## 2013-11-13 ENCOUNTER — Encounter: Payer: Self-pay | Admitting: Internal Medicine

## 2013-11-13 DIAGNOSIS — R21 Rash and other nonspecific skin eruption: Secondary | ICD-10-CM

## 2013-11-13 MED ORDER — TRIAMCINOLONE ACETONIDE 0.1 % EX CREA: 1.0000 "application " | TOPICAL_CREAM | Freq: Two times a day (BID) | CUTANEOUS | Status: DC

## 2013-11-13 NOTE — Telephone Encounter (Signed)
Refilled triamcinolone cream x 1.  

## 2013-11-16 ENCOUNTER — Encounter: Payer: Self-pay | Admitting: Internal Medicine

## 2013-11-16 DIAGNOSIS — B029 Zoster without complications: Secondary | ICD-10-CM | POA: Insufficient documentation

## 2013-11-16 NOTE — Progress Notes (Signed)
  Subjective:    [name], female    DOB: Oct 03, 1945, 68 y.o.   MRN: [id number]  HPI 68 year old female with past history of hypertension and depression who comes in today as a work in with concerns regarding "persistent shingles".  Initially went to acute care and diagnosed with shingles.  States she had a pain upper right chest that extended up her right lateral neck.  Was given valtrex.  Applied topical neosporin.  She then returned to acute care for persistent itching and rash that extended around her neck.  Was given gabapentin.  When she took 300mg  bid, did not tolerate.  She comes in today because of persistent itching and rash.   No abdominal pain or cramping.   Bowels stable.  Eating and drinking well.     Past Medical History  Diagnosis Date  . Depression   . Allergy   . Hypertension   . History of chicken pox   . Hx: UTI (urinary tract infection)     Current Outpatient Prescriptions on File Prior to Visit  Medication Sig Dispense Refill  . estradiol (ESTRACE) 0.1 MG/GM vaginal cream Insert twice a week  42.5 g  1  . lansoprazole (PREVACID SOLUTAB) 30 MG disintegrating tablet Take 1 tablet (30 mg total) by mouth daily.  30 tablet  1  . lisinopril-hydrochlorothiazide (PRINZIDE,ZESTORETIC) 10-12.5 MG per tablet Take 1 tablet by mouth  daily  90 tablet  1  . ranitidine (ZANTAC) 150 MG/10ML syrup Take 10 mLs (150 mg total) by mouth 2 (two) times daily.  300 mL  2  . sertraline (ZOLOFT) 25 MG tablet Take 1 tablet by mouth  daily  90 tablet  1   No current facility-administered medications on file prior to visit.    Review of Systems Patient denies any headache.  No light headedness or dizziness reported.  No chest pain, tightness or palpitations.  No increased shortness of breath.  Persistent rash around her upper chest and neck.  Extends around both sides of her neck.  Increased itching.       Objective:   Physical Exam  Filed Vitals:   11/11/13 1559  BP:  130/80  Pulse: 97  Temp: 97.8 F (4436.256 C)   68 year old female in no acute distress.   HEENT:  Nares- clear.  Oropharynx - without lesions. NECK:  Supple.  Nontender.  No audible bruit.  HEART:  Appears to be regular. LUNGS:  No crackles or wheezing audible.  Respirations even and unlabored.  SKIN:  Erythematous rash noted upper chest and around bilateral neck.  Appears to be c/w a contact dermatitis or allergic reaction (possibly to the neosporin).           Assessment & Plan:  HEALTH MAINTENANCE.  Physical 02/24/13.   Pap 08/31/10 - negative with negative HPV.   Cholesterol last checked 11/19/12 revealed total cholesterol 170, triglycerides 86, HDL 52 and LDL 101.   Mammogram 01/29/13 - Birads I.

## 2013-11-16 NOTE — Assessment & Plan Note (Signed)
Shingles appear to have essentially resolved.  The discomfort she is experiencing appears to be coming more from the erythematous rash extending around her neck.  Increased itching.  Will have her stop the neosporin and all topical creams.  Apply triamcinolone .1% to the affected area (avoiding the face or breast).  Will call with update in a few days.  If persistent problems, will require dermatology referral.  Hold gabapentin at this time.  Does not appear to be needed.

## 2013-11-27 NOTE — Telephone Encounter (Signed)
Mailed unread message to pt  

## 2013-12-02 NOTE — Telephone Encounter (Signed)
Order placed for dermatology referral.  

## 2013-12-02 NOTE — Addendum Note (Signed)
Addended by: Charm BargesSCOTT, Celeste Candelas S on: 12/02/2013 06:28 PM   Modules accepted: Orders

## 2014-02-02 ENCOUNTER — Other Ambulatory Visit: Payer: Self-pay | Admitting: *Deleted

## 2014-02-02 MED ORDER — SERTRALINE HCL 25 MG PO TABS: ORAL_TABLET | ORAL | Status: DC

## 2014-02-25 ENCOUNTER — Other Ambulatory Visit (INDEPENDENT_AMBULATORY_CARE_PROVIDER_SITE_OTHER): Payer: 59

## 2014-02-25 ENCOUNTER — Encounter: Payer: Self-pay | Admitting: Internal Medicine

## 2014-02-25 ENCOUNTER — Telehealth: Payer: Self-pay | Admitting: Internal Medicine

## 2014-02-25 DIAGNOSIS — Z13220 Encounter for screening for lipoid disorders: Secondary | ICD-10-CM

## 2014-02-25 DIAGNOSIS — I10 Essential (primary) hypertension: Secondary | ICD-10-CM

## 2014-02-25 LAB — COMPREHENSIVE METABOLIC PANEL
ALBUMIN: 3.6 g/dL (ref 3.5–5.2)
ALK PHOS: 56 U/L (ref 39–117)
ALT: 16 U/L (ref 0–35)
AST: 21 U/L (ref 0–37)
BUN: 13 mg/dL (ref 6–23)
CO2: 24 mEq/L (ref 19–32)
CREATININE: 0.9 mg/dL (ref 0.4–1.2)
Calcium: 9.6 mg/dL (ref 8.4–10.5)
Chloride: 108 mEq/L (ref 96–112)
GFR: 70.7 mL/min (ref >60.00)
GLUCOSE: 80 mg/dL (ref 70–99)
Potassium: 3.8 mEq/L (ref 3.5–5.1)
Sodium: 144 mEq/L (ref 135–145)
Total Bilirubin: 0.7 mg/dL (ref 0.2–1.2)
Total Protein: 7.2 g/dL (ref 6.0–8.3)

## 2014-02-25 LAB — LIPID PANEL
Cholesterol: 175 mg/dL (ref 0–200)
HDL: 49 mg/dL (ref >39.00)
LDL CALC: 110 mg/dL — AB (ref 0–99)
NONHDL: 126
TRIGLYCERIDES: 80 mg/dL (ref 0.0–149.0)
Total CHOL/HDL Ratio: 4
VLDL: 16 mg/dL (ref 0.0–40.0)

## 2014-02-25 NOTE — Telephone Encounter (Signed)
Pt dropped off [employer] screening form to be filled out. Form in Dr. Roby LoftsScott's box.msn

## 2014-02-27 ENCOUNTER — Ambulatory Visit (INDEPENDENT_AMBULATORY_CARE_PROVIDER_SITE_OTHER): Payer: 59 | Admitting: Internal Medicine

## 2014-02-27 ENCOUNTER — Encounter: Payer: Self-pay | Admitting: Internal Medicine

## 2014-02-27 VITALS — BP 120/80 | HR 83 | Temp 98.2°F | Ht 68.0 in | Wt 210.5 lb

## 2014-02-27 DIAGNOSIS — I10 Essential (primary) hypertension: Secondary | ICD-10-CM

## 2014-02-27 DIAGNOSIS — F32A Depression, unspecified: Secondary | ICD-10-CM

## 2014-02-27 DIAGNOSIS — R42 Dizziness and giddiness: Secondary | ICD-10-CM

## 2014-02-27 DIAGNOSIS — R51 Headache: Secondary | ICD-10-CM

## 2014-02-27 DIAGNOSIS — H029 Unspecified disorder of eyelid: Secondary | ICD-10-CM

## 2014-02-27 DIAGNOSIS — Z91048 Other nonmedicinal substance allergy status: Secondary | ICD-10-CM

## 2014-02-27 DIAGNOSIS — Z9109 Other allergy status, other than to drugs and biological substances: Secondary | ICD-10-CM

## 2014-02-27 DIAGNOSIS — Z1239 Encounter for other screening for malignant neoplasm of breast: Secondary | ICD-10-CM

## 2014-02-27 DIAGNOSIS — Z23 Encounter for immunization: Secondary | ICD-10-CM

## 2014-02-27 DIAGNOSIS — F329 Major depressive disorder, single episode, unspecified: Secondary | ICD-10-CM

## 2014-02-27 DIAGNOSIS — R519 Headache, unspecified: Secondary | ICD-10-CM

## 2014-02-27 DIAGNOSIS — K219 Gastro-esophageal reflux disease without esophagitis: Secondary | ICD-10-CM

## 2014-02-27 MED ORDER — MUPIROCIN CALCIUM 2 % EX CREA: 1.0000 "application " | TOPICAL_CREAM | Freq: Two times a day (BID) | CUTANEOUS | Status: DC

## 2014-02-27 NOTE — Progress Notes (Signed)
Pre visit review using our clinic review tool, if applicable. No additional management support is needed unless otherwise documented below in the visit note. 

## 2014-03-03 ENCOUNTER — Encounter: Payer: Self-pay | Admitting: Internal Medicine

## 2014-03-08 ENCOUNTER — Encounter: Payer: Self-pay | Admitting: Internal Medicine

## 2014-03-08 DIAGNOSIS — R519 Headache, unspecified: Secondary | ICD-10-CM | POA: Insufficient documentation

## 2014-03-08 DIAGNOSIS — H029 Unspecified disorder of eyelid: Secondary | ICD-10-CM | POA: Insufficient documentation

## 2014-03-08 DIAGNOSIS — R51 Headache: Secondary | ICD-10-CM

## 2014-03-08 NOTE — Progress Notes (Signed)
Subjective:    Patient ID: Susan SensingLinda H Miller, female    DOB: 07-04-1945, 68 y.o.   MRN: [id number]  HPI 68 year old female with past history of hypertension and depression who comes in today to follow up on these issues as well as for a complete physical exam.  She reports overall doing relatively well.  Increased stress with her work and some family issues.  Feels she is handling things relatively well.  Desires no further intervention.  Describes headache - over right eye.  Right side of head tender.   No vision change.  No significant dizziness or light headedness.  Breathing stable.  No nausea or vomiting.  Bowels stable.   No urinary symptoms.    Past Medical History  Diagnosis Date  . Depression   . Allergy   . Hypertension   . History of chicken pox   . Hx: UTI (urinary tract infection)     Current Outpatient Prescriptions on File Prior to Visit  Medication Sig Dispense Refill  . lisinopril-hydrochlorothiazide (PRINZIDE,ZESTORETIC) 10-12.5 MG per tablet Take 1 tablet by mouth  daily 90 tablet 1  . ranitidine (ZANTAC) 150 MG/10ML syrup Take 10 mLs (150 mg total) by mouth 2 (two) times daily. 300 mL 2  . sertraline (ZOLOFT) 25 MG tablet Take 1 tablet by mouth  daily 90 tablet 0   No current facility-administered medications on file prior to visit.    Review of Systems Headache as outlined.  No significant light headedness or dizziness reported.  No chest pain, tightness or palpitations.  No increased shortness of breath.  No nausea or vomiting.  No abdominal pain or cramping.  No bowel change.  No urinary issues.  Increased stress as outlined.        Objective:   Physical Exam  Filed Vitals:   02/27/14 1540  BP: 120/80  Pulse: 83  Temp: 98.2 F (36.8 C)   Blood pressure recheck:  33122/2382  68 year old female in no acute distress.   HEENT:  Nares- clear.  Oropharynx - without lesions.  No rash.  Small raised area right side of head - tender.   NECK:  Supple.  Nontender.   No audible bruit.  HEART:  Appears to be regular. LUNGS:  No crackles or wheezing audible.  Respirations even and unlabored.  RADIAL PULSE:  Equal bilaterally.    BREASTS:  No nipple discharge or nipple retraction present.  Could not appreciate any distinct nodules or axillary adenopathy.  ABDOMEN:  Soft, nontender.  Bowel sounds present and normal.  No audible abdominal bruit.  GU:  Not performed.     EXTREMITIES:  No increased edema present.  DP pulses palpable and equal bilaterally.          Assessment & Plan:  1.  Encounter for immunization Flu shot given today.   2. Essential hypertension, benign Blood pressure doing well.  Same medication regimen.  Follow metabolic panel.   3. Gastroesophageal reflux disease, esophagitis presence not specified Controlled on zantac.   4. Depression Feels she is handling things relatively well.  Follow.  Same medication regimen.    5. Environmental allergies Controlled.    6. Light headedness Not a significant issue today.  Follow.   7. Headache, unspecified headache type Discussed at length with her.  She declines further w/up.  Feels related to increased stress.  Wants to follow.  Will call with update.  If persistent, will require further evaluation and w/up.   8. Eyelid  lesion bactroban as outlined.  Follow.    9. Breast cancer screening - [facility] DIGITAL SCREENING BILATERAL; Future   HEALTH MAINTENANCE.  Physical today.   Pap 08/31/10 - negative with negative HPV.   Cholesterol last checked 02/25/14 revealed total cholesterol 175, triglycerides 06, HDL 49 and LDL 110.   Mammogram 01/29/13 - Birads I.  Schedule f/u mammogram.    I spent 25 minutes with the patient and more than 50% of the time was spent in consultation regarding the above.

## 2014-03-26 DIAGNOSIS — I10 Essential (primary) hypertension: Secondary | ICD-10-CM | POA: Insufficient documentation

## 2014-04-02 ENCOUNTER — Other Ambulatory Visit: Payer: Self-pay | Admitting: Internal Medicine

## 2014-04-17 ENCOUNTER — Other Ambulatory Visit: Payer: Self-pay | Admitting: Internal Medicine

## 2014-06-30 ENCOUNTER — Other Ambulatory Visit (INDEPENDENT_AMBULATORY_CARE_PROVIDER_SITE_OTHER): Payer: 59

## 2014-06-30 ENCOUNTER — Telehealth: Payer: Self-pay | Admitting: *Deleted

## 2014-06-30 DIAGNOSIS — Z13220 Encounter for screening for lipoid disorders: Secondary | ICD-10-CM

## 2014-06-30 DIAGNOSIS — I10 Essential (primary) hypertension: Secondary | ICD-10-CM

## 2014-06-30 NOTE — Telephone Encounter (Signed)
Pt would like labs to go to [facility]

## 2014-06-30 NOTE — Telephone Encounter (Signed)
Orders placed for labs

## 2014-06-30 NOTE — Telephone Encounter (Signed)
Labs and dx?  

## 2014-07-01 LAB — CBC WITH DIFFERENTIAL/PLATELET
BASOS PCT: 0.5 % (ref 0.0–3.0)
Basophils Absolute: 0 10*3/uL (ref 0.0–0.1)
Eosinophils Absolute: 0.2 10*3/uL (ref 0.0–0.7)
Eosinophils Relative: 3.5 % (ref 0.0–5.0)
HEMATOCRIT: 37 % (ref 36.0–46.0)
HEMOGLOBIN: 12.7 g/dL (ref 12.0–15.0)
LYMPHS PCT: 37.7 % (ref 12.0–46.0)
Lymphs Abs: 2.3 10*3/uL (ref 0.7–4.0)
MCHC: 34.2 g/dL (ref 30.0–36.0)
MCV: 89.4 fl (ref 78.0–100.0)
MONO ABS: 0.5 10*3/uL (ref 0.1–1.0)
Monocytes Relative: 7.5 % (ref 3.0–12.0)
Neutro Abs: 3.1 10*3/uL (ref 1.4–7.7)
Neutrophils Relative %: 50.8 % (ref 43.0–77.0)
Platelets: 258 10*3/uL (ref 150.0–400.0)
RBC: 4.14 Mil/uL (ref 3.87–5.11)
RDW: 13.4 % (ref 11.5–15.5)
WBC: 6.1 10*3/uL (ref 4.0–10.5)

## 2014-07-01 LAB — TSH: TSH: 2.07 u[IU]/mL (ref 0.35–4.50)

## 2014-07-01 LAB — COMPREHENSIVE METABOLIC PANEL
ALT: 12 U/L (ref 0–35)
AST: 14 U/L (ref 0–37)
Albumin: 4.2 g/dL (ref 3.5–5.2)
Alkaline Phosphatase: 63 U/L (ref 39–117)
BUN: 15 mg/dL (ref 6–23)
CO2: 30 meq/L (ref 19–32)
CREATININE: 0.82 mg/dL (ref 0.40–1.20)
Calcium: 9.5 mg/dL (ref 8.4–10.5)
Chloride: 107 mEq/L (ref 96–112)
GFR: 73.61 mL/min (ref >60.00)
GLUCOSE: 103 mg/dL — AB (ref 70–99)
POTASSIUM: 4.4 meq/L (ref 3.5–5.1)
Sodium: 141 mEq/L (ref 135–145)
Total Bilirubin: 0.5 mg/dL (ref 0.2–1.2)
Total Protein: 6.6 g/dL (ref 6.0–8.3)

## 2014-07-01 LAB — LIPID PANEL
CHOLESTEROL: 180 mg/dL (ref 0–200)
HDL: 48.8 mg/dL (ref >39.00)
LDL Cholesterol: 112 mg/dL — ABNORMAL HIGH (ref 0–99)
NonHDL: 131.2
Total CHOL/HDL Ratio: 4
Triglycerides: 97 mg/dL (ref 0.0–149.0)
VLDL: 19.4 mg/dL (ref 0.0–40.0)

## 2014-07-02 ENCOUNTER — Ambulatory Visit (INDEPENDENT_AMBULATORY_CARE_PROVIDER_SITE_OTHER): Payer: 59 | Admitting: Internal Medicine

## 2014-07-02 ENCOUNTER — Encounter: Payer: Self-pay | Admitting: Internal Medicine

## 2014-07-02 VITALS — BP 136/74 | HR 79 | Temp 98.4°F | Ht 68.0 in | Wt 216.0 lb

## 2014-07-02 DIAGNOSIS — Z0000 Encounter for general adult medical examination without abnormal findings: Secondary | ICD-10-CM

## 2014-07-02 DIAGNOSIS — Z9109 Other allergy status, other than to drugs and biological substances: Secondary | ICD-10-CM

## 2014-07-02 DIAGNOSIS — K219 Gastro-esophageal reflux disease without esophagitis: Secondary | ICD-10-CM

## 2014-07-02 DIAGNOSIS — M7989 Other specified soft tissue disorders: Secondary | ICD-10-CM

## 2014-07-02 DIAGNOSIS — F32A Depression, unspecified: Secondary | ICD-10-CM

## 2014-07-02 DIAGNOSIS — I10 Essential (primary) hypertension: Secondary | ICD-10-CM

## 2014-07-02 DIAGNOSIS — Z91048 Other nonmedicinal substance allergy status: Secondary | ICD-10-CM

## 2014-07-02 DIAGNOSIS — R05 Cough: Secondary | ICD-10-CM

## 2014-07-02 DIAGNOSIS — R059 Cough, unspecified: Secondary | ICD-10-CM

## 2014-07-02 DIAGNOSIS — Z1239 Encounter for other screening for malignant neoplasm of breast: Secondary | ICD-10-CM

## 2014-07-02 DIAGNOSIS — F329 Major depressive disorder, single episode, unspecified: Secondary | ICD-10-CM

## 2014-07-02 NOTE — Patient Instructions (Signed)
Take the zantac suspension daily.   Nasacort nasal spray - 2 sprays each nostril one time per day.  Do this in the evening.   Saline nasal spray - flush nose at least 2-3x/day

## 2014-07-02 NOTE — Progress Notes (Signed)
Pre visit review using our clinic review tool, if applicable. No additional management support is needed unless otherwise documented below in the visit note. 

## 2014-07-05 ENCOUNTER — Encounter: Payer: Self-pay | Admitting: Internal Medicine

## 2014-07-05 ENCOUNTER — Telehealth: Payer: Self-pay | Admitting: Internal Medicine

## 2014-07-05 DIAGNOSIS — Z0000 Encounter for general adult medical examination without abnormal findings: Secondary | ICD-10-CM | POA: Insufficient documentation

## 2014-07-05 NOTE — Assessment & Plan Note (Signed)
Not an issue now.  Follow.    

## 2014-07-05 NOTE — Assessment & Plan Note (Signed)
Physical 02/27/14.  PAP 08/31/10 - negative with negative HPV.  Overdue mammogram.  Schedule.

## 2014-07-05 NOTE — Assessment & Plan Note (Signed)
Blood pressure under good control.  Same medication regimen.  Follow pressures.  Follow metabolic panel.  

## 2014-07-05 NOTE — Assessment & Plan Note (Signed)
Saline nasal spray, nasacort nasal spray and robitussin as directed.  Follow.  Notify me if persistent.  Take zantac daily as outlined.

## 2014-07-05 NOTE — Assessment & Plan Note (Signed)
Persistent intermittent cough as outlined.  Treat allergies and reflux as outlined.  Get her back in soon to reassess.  Further w/up pending.  Has been on lisinopril for a long time.  Doubt related.  Follow.

## 2014-07-05 NOTE — Telephone Encounter (Signed)
Needs 30 minute f/u appt with me in 2 months.  Needs late pm appt.  Thanks.

## 2014-07-05 NOTE — Assessment & Plan Note (Signed)
Not taking the zantac suspension regularly.  Some persistent intermittent cough.  Take zantac regularly.  Treat allergies.  Follow.  Get her back in soon to reassess.  Further w/up pending.

## 2014-07-05 NOTE — Assessment & Plan Note (Signed)
Increased stress with her sons issues.  Continue on zoloft.  Follow.

## 2014-07-05 NOTE — Progress Notes (Signed)
[name], female   DOB: 1945/11/09, 69 y.o.   MRN: [id number]   Subjective:    [name], female    DOB: 12-29-1945, 69 y.o.   MRN: [id number]  HPI  Patient here for a scheduled follow up.  We discussed her increased stress with her son's issues.  She feels she is handling things relatively well.  Breathing stable.  Does report the persistent intermittent cough.  No significant congestion.  Breathing stable.  No sob.  Eating and drinking well.  Bowels stable.  No leg swelling reported.  Increased acid reflux.  Not taking zantac daily.     Past Medical History  Diagnosis Date  . Depression   . Allergy   . Hypertension   . History of chicken pox   . Hx: UTI (urinary tract infection)     Current Outpatient Prescriptions on File Prior to Visit  Medication Sig Dispense Refill  . lisinopril-hydrochlorothiazide (PRINZIDE,ZESTORETIC) 10-12.5 MG per tablet Take 1 tablet by mouth  daily 90 tablet 1  . ranitidine (ZANTAC) 150 MG/10ML syrup Take 10 mLs (150 mg total) by mouth 2 (two) times daily. 300 mL 2  . sertraline (ZOLOFT) 25 MG tablet Take 1 tablet by mouth  daily 90 tablet 1   No current facility-administered medications on file prior to visit.    Review of Systems  Constitutional: Negative for appetite change and unexpected weight change.  HENT: Positive for congestion and postnasal drip. Negative for sinus pressure.   Respiratory: Positive for cough (persistent intermittent cough). Negative for chest tightness, shortness of breath and wheezing.   Cardiovascular: Negative for chest pain, palpitations and leg swelling.  Gastrointestinal: Negative for nausea, vomiting, abdominal pain and diarrhea.  Genitourinary: Negative for dysuria and difficulty urinating.  Neurological: Negative for dizziness, light-headedness and headaches.  Psychiatric/Behavioral:       Feels she is handling things relatively well.  On zoloft.         Objective:     Blood  pressure recheck:  120/78  Physical Exam  Constitutional: She appears well-developed and well-nourished. No distress.  HENT:  Nose: Nose normal.  Mouth/Throat: Oropharynx is clear and moist.  Neck: Neck supple. No thyromegaly present.  Cardiovascular: Normal rate and regular rhythm.   Pulmonary/Chest: Breath sounds normal. No respiratory distress. She has no wheezes.  Abdominal: Soft. Bowel sounds are normal. There is no tenderness.  Musculoskeletal: She exhibits no edema or tenderness.  Lymphadenopathy:    She has no cervical adenopathy.  Skin: No rash noted. No erythema.    BP 136/74 mmHg  Pulse 79  Temp(Src) 98.4 F (36.9 C) (Oral)  Ht  (1.727 m)  Wt 216 lb (97.977 kg)  BMI 32.85 kg/m2  SpO2 97% Wt Readings from Last 3 Encounters:  07/02/14 216 lb (97.977 kg)  02/27/14 210 lb 8 oz (95.482 kg)  11/11/13 210 lb (95.255 kg)     Lab Results  Component Value Date   WBC 6.1 06/30/2014   HGB 12.7 06/30/2014   HCT 37.0 06/30/2014   PLT 258.0 06/30/2014   GLUCOSE 103* 06/30/2014   CHOL 180 06/30/2014   TRIG 97.0 06/30/2014   HDL 48.80 06/30/2014   LDLCALC 112* 06/30/2014   ALT 12 06/30/2014   AST 14 06/30/2014   NA 141 06/30/2014   K 4.4 06/30/2014   CL 107 06/30/2014   CREATININE 0.82 06/30/2014   BUN 15 06/30/2014   CO2 30 06/30/2014   TSH 2.07 06/30/2014  Assessment & Plan:   Problem List Items Addressed This Visit    Cough    Persistent intermittent cough as outlined.  Treat allergies and reflux as outlined.  Get her back in soon to reassess.  Further w/up pending.  Has been on lisinopril for a long time.  Doubt related.  Follow.        Depression    Increased stress with her sons issues.  Continue on zoloft.  Follow.        Environmental allergies    Saline nasal spray, nasacort nasal spray and robitussin as directed.  Follow.  Notify me if persistent.  Take zantac daily as outlined.        Essential hypertension, benign    Blood  pressure under good control.  Same medication regimen.  Follow pressures.  Follow metabolic panel.        GERD (gastroesophageal reflux disease)    Not taking the zantac suspension regularly.  Some persistent intermittent cough.  Take zantac regularly.  Treat allergies.  Follow.  Get her back in soon to reassess.  Further w/up pending.        Health care maintenance    Physical 02/27/14.  PAP 08/31/10 - negative with negative HPV.  Overdue mammogram.  Schedule.        Swelling of extremity    Not an issue now.  Follow.         Other Visit Diagnoses    Breast cancer screening    -  Primary    Relevant Orders    MM DIGITAL SCREENING BILATERAL      I spent 25 minutes with the patient and more than 50% of the time was spent in consultation regarding the above.     Dale DurhamSCOTT, Kriya Westra, MD

## 2014-07-27 ENCOUNTER — Telehealth: Payer: Self-pay | Admitting: Internal Medicine

## 2014-07-27 ENCOUNTER — Encounter: Payer: Self-pay | Admitting: Internal Medicine

## 2014-07-27 ENCOUNTER — Ambulatory Visit (INDEPENDENT_AMBULATORY_CARE_PROVIDER_SITE_OTHER): Payer: 59 | Admitting: Internal Medicine

## 2014-07-27 VITALS — BP 138/78 | HR 80 | Temp 97.8°F | Ht 68.0 in | Wt 217.4 lb

## 2014-07-27 DIAGNOSIS — L539 Erythematous condition, unspecified: Secondary | ICD-10-CM | POA: Insufficient documentation

## 2014-07-27 DIAGNOSIS — I10 Essential (primary) hypertension: Secondary | ICD-10-CM

## 2014-07-27 NOTE — Patient Instructions (Addendum)
Try using Vanicream or Eucerin cream for a few days rather than face cream with Vit C.  Try starting Claritin or Zyrtec to help with allergies.  Call immediately if any questions or concerns.

## 2014-07-27 NOTE — Assessment & Plan Note (Signed)
BP Readings from Last 3 Encounters:  07/27/14 138/78  07/02/14 136/74  02/27/14 120/80   BP well controlled here. Suspect home cuff was inaccurate. She will bring cuff next visit for comparison with our meter. Continue Lisinopril-HCTZ. Follow up immediately if any headache, chest pain, or other concerns.

## 2014-07-27 NOTE — Assessment & Plan Note (Signed)
Mild facial erythema over cheeks. Likely related to seasonal allergies, or possible sensitivity to facial lotion. Recommended using Vanicream or Eucerin to face, as well as gentle soaps and detergents. Recommended starting oral Claritin or Zyrtec for seasonal allergies. Follow up prn if symptoms are not improving.

## 2014-07-27 NOTE — Progress Notes (Signed)
Pre visit review using our clinic review tool, if applicable. No additional management support is needed unless otherwise documented below in the visit note. 

## 2014-07-27 NOTE — Telephone Encounter (Signed)
FYI: Appt was made today with Dr. Dan HumphreysWalker @ 9:45.

## 2014-07-27 NOTE — Telephone Encounter (Signed)
Patient Name: Susan PuntLINDA Legore  DOB: Nov 18, 1945    Initial Comment Caller states her face is really red. BP 189/89   Nurse Assessment  Nurse: Scarlette ArStandifer, RN, Heather Date/Time (Eastern Time): 07/27/2014 8:33:53 AM  Confirm and document reason for call. If symptomatic, describe symptoms. ---Caller states her face is really red. BP 189/89, she woke up with her face flushed this morning.  Has the patient traveled out of the country within the last 30 days? ---Not Applicable  Does the patient require triage? ---Yes  Related visit to physician within the last 2 weeks? ---No  Does the PT have any chronic conditions? (i.e. diabetes, asthma, etc.) ---Yes  List chronic conditions. ---HTN     Guidelines    Guideline Title Affirmed Question Affirmed Notes  High Blood Pressure BP ? 180/110    Final Disposition User   See Physician within 24 Hours Standifer, RN, Research scientist (physical sciences)Heather    Comments  Appt made for today at 9:45 with Dr. Dan HumphreysWalker.

## 2014-07-27 NOTE — Progress Notes (Signed)
Subjective:    Patient ID: Tona SensingLinda H Kos, female    DOB: 10-31-1945, 69 y.o.   MRN: [id number]  HPI  69YO female presents for acute visit.  Woke up this morning with facial redness over cheeks bilaterally. Took BP and it was 189/89 but BP cuff kept falling off.   No recent use of antibiotics. No new facial creams. No new detergents or soaps. Uses a facial cream with Vit C. Eyes have felt more watery lately c/w seasonal allergies. No sneezing. No fever, chills. No chest pain, headache, sore throat.   BP Readings from Last 3 Encounters:  07/27/14 138/78  07/02/14 136/74  02/27/14 120/80    Past medical, surgical, family and social history per today's encounter.  Review of Systems  Constitutional: Negative for fever, chills, appetite change, fatigue and unexpected weight change.  HENT: Negative for congestion, postnasal drip, rhinorrhea, sneezing, sore throat, trouble swallowing and voice change.   Eyes: Positive for discharge (clear). Negative for visual disturbance.  Respiratory: Negative for shortness of breath.   Cardiovascular: Negative for chest pain and leg swelling.  Gastrointestinal: Negative for vomiting, abdominal pain, diarrhea and constipation.  Skin: Positive for color change. Negative for rash.  Hematological: Negative for adenopathy. Does not bruise/bleed easily.  Psychiatric/Behavioral: Negative for dysphoric mood. The patient is nervous/anxious.        Objective:    BP 138/78 mmHg  Pulse 80  Temp(Src) 97.8 F (36.6 C) (Oral)  Ht 5\' 8"  (1.727 m)  Wt 217 lb 6 oz (98.601 kg)  BMI 33.06 kg/m2  SpO2 99% Physical Exam  Constitutional: She is oriented to person, place, and time. She appears well-developed and well-nourished. No distress.  HENT:  Head: Normocephalic and atraumatic.  Right Ear: External ear normal.  Left Ear: External ear normal.  Nose: Nose normal.  Mouth/Throat: Oropharynx is clear and moist. No oropharyngeal exudate.  Eyes:  Conjunctivae are normal. Pupils are equal, round, and reactive to light. Right eye exhibits no discharge. Left eye exhibits no discharge. No scleral icterus.  Neck: Normal range of motion. Neck supple. No tracheal deviation present. No thyromegaly present.  Cardiovascular: Normal rate, regular rhythm, normal heart sounds and intact distal pulses.  Exam reveals no gallop and no friction rub.   No murmur heard. Pulmonary/Chest: Effort normal and breath sounds normal. No respiratory distress. She has no wheezes. She has no rales. She exhibits no tenderness.  Musculoskeletal: Normal range of motion. She exhibits no edema or tenderness.  Lymphadenopathy:    She has no cervical adenopathy.  Neurological: She is alert and oriented to person, place, and time. No cranial nerve deficit. She exhibits normal muscle tone. Coordination normal.  Skin: Skin is warm and dry. No rash noted. She is not diaphoretic. There is erythema (very mild erythema over bilateral cheeks). No pallor.  Psychiatric: She has a normal mood and affect. Her behavior is normal. Judgment and thought content normal.          Assessment & Plan:   Problem List Items Addressed This Visit      Unprioritized   Essential hypertension, benign - Primary    BP Readings from Last 3 Encounters:  07/27/14 138/78  07/02/14 136/74  02/27/14 120/80   BP well controlled here. Suspect home cuff was inaccurate. She will bring cuff next visit for comparison with our meter. Continue Lisinopril-HCTZ. Follow up immediately if any headache, chest pain, or other concerns.      Facial erythema    Mild facial  erythema over cheeks. Likely related to seasonal allergies, or possible sensitivity to facial lotion. Recommended using Vanicream or Eucerin to face, as well as gentle soaps and detergents. Recommended starting oral Claritin or Zyrtec for seasonal allergies. Follow up prn if symptoms are not improving.           Return if symptoms worsen  or fail to improve.

## 2014-07-28 ENCOUNTER — Telehealth: Payer: Self-pay | Admitting: Internal Medicine

## 2014-07-28 NOTE — Telephone Encounter (Signed)
emmi emailed °

## 2014-09-11 ENCOUNTER — Ambulatory Visit: Payer: 59 | Admitting: Internal Medicine

## 2014-09-29 ENCOUNTER — Other Ambulatory Visit: Payer: Self-pay | Admitting: Internal Medicine

## 2014-09-29 NOTE — Telephone Encounter (Signed)
Refilled zoloft #90 with no refills.  Overdue f/u appt with me.  Need to schedule 30 min f/u appt.

## 2014-09-29 NOTE — Telephone Encounter (Signed)
Left message for pt to return call.

## 2014-09-29 NOTE — Telephone Encounter (Signed)
Last Ov 3.21.16, last refill 3.26.16.  Please advise refill

## 2014-09-30 NOTE — Telephone Encounter (Signed)
Left message on VM to return call 

## 2014-10-14 ENCOUNTER — Other Ambulatory Visit: Payer: Self-pay | Admitting: Internal Medicine

## 2014-10-24 ENCOUNTER — Other Ambulatory Visit: Payer: Self-pay | Admitting: Internal Medicine

## 2014-12-28 ENCOUNTER — Other Ambulatory Visit: Payer: Self-pay | Admitting: Internal Medicine

## 2014-12-28 NOTE — Telephone Encounter (Signed)
Last OV 2.25.16.  Please advise refill 

## 2014-12-28 NOTE — Telephone Encounter (Signed)
I have sent in prescription - zoloft #90 with no refills.  She needs a physical scheduled after 02/28/15.  Please schedule.

## 2015-02-15 ENCOUNTER — Telehealth: Payer: Self-pay | Admitting: Internal Medicine

## 2015-02-15 NOTE — Telephone Encounter (Signed)
Pt sent a Mychart message stating: I am in my third UTI in a short period of time. I had an antibiotic called in Saturday. My stomach feels swollen and the frequency is still happening. The urination is not as painful. I would like to come in for a urine culture to see if this medicine is what I should be taking. Thanks.

## 2015-02-15 NOTE — Telephone Encounter (Signed)
Ok.  Let me know if I need to do anything.  If having persistent problems, will need to be reevaluated.

## 2015-02-15 NOTE — Telephone Encounter (Signed)
If persistent symptoms, will need to be seen.  Did they send a culture before she started this abx.  If so, we can call and get results.

## 2015-02-15 NOTE — Telephone Encounter (Signed)
Please advise 

## 2015-02-15 NOTE — Telephone Encounter (Signed)
Left a message for patient to return my call to get clarification.

## 2015-02-18 ENCOUNTER — Telehealth: Payer: Self-pay | Admitting: Internal Medicine

## 2015-02-18 NOTE — Telephone Encounter (Signed)
Pt came in and drop off her [facility] health screening. It is in Dr. Lorin PicketScott box. Thank You!

## 2015-02-18 NOTE — Telephone Encounter (Signed)
Placed in red folder  

## 2015-02-18 NOTE — Telephone Encounter (Signed)
I have not seen her since 06/2014.  When does she need the form?  Need to see her to discuss weight loss.  I can work her in within the week.

## 2015-02-19 NOTE — Telephone Encounter (Signed)
Left patient a message on cell to schedule an appt.

## 2015-02-20 NOTE — Telephone Encounter (Signed)
Has appt on 02/22/15

## 2015-02-22 ENCOUNTER — Ambulatory Visit (INDEPENDENT_AMBULATORY_CARE_PROVIDER_SITE_OTHER): Payer: 59 | Admitting: Internal Medicine

## 2015-02-22 ENCOUNTER — Encounter: Payer: Self-pay | Admitting: Internal Medicine

## 2015-02-22 VITALS — BP 110/70 | HR 89 | Temp 98.0°F | Resp 18 | Ht 67.25 in | Wt 215.0 lb

## 2015-02-22 DIAGNOSIS — I10 Essential (primary) hypertension: Secondary | ICD-10-CM | POA: Diagnosis not present

## 2015-02-22 DIAGNOSIS — N390 Urinary tract infection, site not specified: Secondary | ICD-10-CM

## 2015-02-22 DIAGNOSIS — F329 Major depressive disorder, single episode, unspecified: Secondary | ICD-10-CM

## 2015-02-22 DIAGNOSIS — Z1239 Encounter for other screening for malignant neoplasm of breast: Secondary | ICD-10-CM

## 2015-02-22 DIAGNOSIS — Z23 Encounter for immunization: Secondary | ICD-10-CM

## 2015-02-22 DIAGNOSIS — Z713 Dietary counseling and surveillance: Secondary | ICD-10-CM

## 2015-02-22 DIAGNOSIS — F32A Depression, unspecified: Secondary | ICD-10-CM

## 2015-02-22 NOTE — Assessment & Plan Note (Signed)
Blood pressure under good control.  Continue same medication regimen.  Follow pressures.  Follow metabolic panel.   

## 2015-02-22 NOTE — Progress Notes (Signed)
Pre-visit discussion using our clinic review tool. No additional management support is needed unless otherwise documented below in the visit note.  

## 2015-02-22 NOTE — Progress Notes (Signed)
[name], female   DOB: 11-06-1945, 69 y.o.   MRN: [id number]   Subjective:    [name], female    DOB: 1945-12-11, 69 y.o.   MRN: [id number]  HPI  Patient with past history of hypertension, GERD and depression who comes in as a work in with concerns regarding her weight.  She has a form that needs to be completed for her work.  She is over the recommended BMI and needs a form completed for her insurance.  We discussed her weight.  Discussed diet adjustment and exercise.  She has bought a new exercise bike and plans to start riding.  Discussed cutting back on carbs.  Breathing stable.  No nausea or vomiting.  Bowels stable.  Has had three uti's in the last few months.  Discussed the need for further w/up regarding etiology of the reoccurring infections.  No vaginal symptoms.     Past Medical History  Diagnosis Date  . Depression   . Allergy   . Hypertension   . History of chicken pox   . Hx: UTI (urinary tract infection)    Past Surgical History  Procedure Laterality Date  . Tonsillectomy      as a child   Family History  Problem Relation Age of Onset  . Hypertension Mother   . Cancer Sister     colon cancer  . Hypertension Sister   . Diabetes Sister   . Hypertension Brother   . Cancer Maternal Aunt     breast cancer  . Hypertension Maternal Aunt   . Hypertension Maternal Uncle   . Stroke Maternal Grandmother   . Hypertension Maternal Grandmother    Social History   Social History  . Marital Status: Divorced    Spouse Name: N/A  . Number of Children: N/A  . Years of Education: N/A   Social History Main Topics  . Smoking status: Never Smoker   . Smokeless tobacco: Never Used  . Alcohol Use: No  . Drug Use: No  . Sexual Activity: Not Asked   Other Topics Concern  . None   Social History Narrative    Outpatient Encounter Prescriptions as of 02/22/2015  Medication Sig  . lisinopril-hydrochlorothiazide (PRINZIDE,ZESTORETIC)  10-12.5 MG per tablet Take 1 tablet by mouth  daily  . ranitidine (ZANTAC) 15 MG/ML syrup TAKE 10 MLS BY MOUTH 2 TIMES DAILY.  Marland Kitchen sertraline (ZOLOFT) 25 MG tablet Take 1 tablet by mouth  daily   No facility-administered encounter medications on file as of 02/22/2015.    Review of Systems  Constitutional: Negative for appetite change and unexpected weight change.  HENT: Negative for congestion and sinus pressure.   Respiratory: Negative for cough, chest tightness and shortness of breath.   Cardiovascular: Negative for chest pain, palpitations and leg swelling.  Gastrointestinal: Negative for nausea, vomiting, abdominal pain and diarrhea.  Genitourinary: Negative for dysuria and difficulty urinating.  Skin: Negative for color change and rash.  Neurological: Negative for dizziness, light-headedness and headaches.  Psychiatric/Behavioral: Negative for dysphoric mood and agitation.       Increased stress with work.        Objective:     Blood pressure rechecked by me:  136/78  Physical Exam  Constitutional: She appears well-developed and well-nourished. No distress.  HENT:  Nose: Nose normal.  Mouth/Throat: Oropharynx is clear and moist.  Neck: Neck supple. No thyromegaly present.  Cardiovascular: Normal rate and regular rhythm.   Pulmonary/Chest: Breath sounds  normal. No respiratory distress. She has no wheezes.  Abdominal: Soft. Bowel sounds are normal. There is no tenderness.  Musculoskeletal: She exhibits no edema or tenderness.  Lymphadenopathy:    She has no cervical adenopathy.  Skin: No rash noted. No erythema.  Psychiatric: She has a normal mood and affect. Her behavior is normal.    BP 110/70 mmHg  Pulse 89  Temp(Src) 98 F (36.7 C) (Oral)  Resp 18  Ht 5' 7.25" (1.708 m)  Wt 215 lb (97.523 kg)  BMI 33.43 kg/m2  SpO2 96% Wt Readings from Last 3 Encounters:  02/22/15 215 lb (97.523 kg)  07/27/14 217 lb 6 oz (98.601 kg)  07/02/14 216 lb (97.977 kg)     Lab  Results  Component Value Date   WBC 6.1 06/30/2014   HGB 12.7 06/30/2014   HCT 37.0 06/30/2014   PLT 258.0 06/30/2014   GLUCOSE 103* 06/30/2014   CHOL 180 06/30/2014   TRIG 97.0 06/30/2014   HDL 48.80 06/30/2014   LDLCALC 112* 06/30/2014   ALT 12 06/30/2014   AST 14 06/30/2014   NA 141 06/30/2014   K 4.4 06/30/2014   CL 107 06/30/2014   CREATININE 0.82 06/30/2014   BUN 15 06/30/2014   CO2 30 06/30/2014   TSH 2.07 06/30/2014       Assessment & Plan:   Problem List Items Addressed This Visit    Depression    Stable on sertraline.        Essential hypertension, benign    Blood pressure under good control.  Continue same medication regimen.  Follow pressures.  Follow metabolic panel.        Frequent UTI    Discussed with her today.  Discussed referral back to urology.  Will notify me if agrees.  Has been years since last evaluated.  No symptoms today.       Weight loss counseling, encounter for    Discussed diet and exercise.  Information given to pt.  She has joined [employer].  Follow.  Keep appt next month.  Form completed.         Other Visit Diagnoses    Breast cancer screening    -  Primary    Relevant Orders    MM DIGITAL SCREENING BILATERAL    Encounter for immunization            Dale DurhamSCOTT, Almetta Liddicoat, MD

## 2015-02-22 NOTE — Assessment & Plan Note (Addendum)
Discussed with her today.  Discussed referral back to urology.  Will notify me if agrees.  Has been years since last evaluated.  No symptoms today.

## 2015-02-22 NOTE — Assessment & Plan Note (Signed)
Discussed diet and exercise.  Information given to pt.  She has joined [employer].  Follow.  Keep appt next month.  Form completed.

## 2015-02-22 NOTE — Assessment & Plan Note (Signed)
Stable on sertraline.

## 2015-02-26 ENCOUNTER — Encounter: Payer: Self-pay | Admitting: Internal Medicine

## 2015-02-26 DIAGNOSIS — Z87440 Personal history of urinary (tract) infections: Secondary | ICD-10-CM

## 2015-03-01 ENCOUNTER — Other Ambulatory Visit: Payer: Self-pay | Admitting: Internal Medicine

## 2015-03-01 NOTE — Telephone Encounter (Signed)
Order placed for urology referral.   Pt notified via my chart.

## 2015-03-02 LAB — BASIC METABOLIC PANEL
BUN / CREAT RATIO: 18 (ref 11–26)
BUN: 14 mg/dL (ref 8–27)
CO2: 27 mmol/L (ref 18–29)
CREATININE: 0.8 mg/dL (ref 0.57–1.00)
Calcium: 9.8 mg/dL (ref 8.7–10.3)
Chloride: 102 mmol/L (ref 97–106)
GFR, EST AFRICAN AMERICAN: 88 mL/min/{1.73_m2} (ref >59)
GFR, EST NON AFRICAN AMERICAN: 76 mL/min/{1.73_m2} (ref >59)
Glucose: 104 mg/dL — ABNORMAL HIGH (ref 65–99)
Potassium: 4.2 mmol/L (ref 3.5–5.2)
SODIUM: 143 mmol/L (ref 136–144)

## 2015-03-02 LAB — CBC WITH DIFFERENTIAL/PLATELET
Basophils Absolute: 0 10*3/uL (ref 0.0–0.2)
Basos: 0 %
EOS (ABSOLUTE): 0.2 10*3/uL (ref 0.0–0.4)
EOS: 3 %
HEMATOCRIT: 37.7 % (ref 34.0–46.6)
HEMOGLOBIN: 13.1 g/dL (ref 11.1–15.9)
Immature Grans (Abs): 0 10*3/uL (ref 0.0–0.1)
Immature Granulocytes: 0 %
LYMPHS ABS: 2.9 10*3/uL (ref 0.7–3.1)
Lymphs: 46 %
MCH: 31 pg (ref 26.6–33.0)
MCHC: 34.7 g/dL (ref 31.5–35.7)
MCV: 89 fL (ref 79–97)
MONOCYTES: 9 %
Monocytes Absolute: 0.6 10*3/uL (ref 0.1–0.9)
NEUTROS ABS: 2.7 10*3/uL (ref 1.4–7.0)
Neutrophils: 42 %
Platelets: 298 10*3/uL (ref 150–379)
RBC: 4.23 x10E6/uL (ref 3.77–5.28)
RDW: 13.2 % (ref 12.3–15.4)
WBC: 6.4 10*3/uL (ref 3.4–10.8)

## 2015-03-02 LAB — LIPID PANEL W/O CHOL/HDL RATIO
Cholesterol, Total: 183 mg/dL (ref 100–199)
HDL: 51 mg/dL (ref >39)
LDL CALC: 118 mg/dL — AB (ref 0–99)
Triglycerides: 72 mg/dL (ref 0–149)
VLDL Cholesterol Cal: 14 mg/dL (ref 5–40)

## 2015-03-02 LAB — HEPATIC FUNCTION PANEL
ALBUMIN: 4.4 g/dL (ref 3.6–4.8)
ALT: 11 IU/L (ref 0–32)
AST: 13 IU/L (ref 0–40)
Alkaline Phosphatase: 69 IU/L (ref 39–117)
BILIRUBIN TOTAL: 0.3 mg/dL (ref 0.0–1.2)
BILIRUBIN, DIRECT: 0.07 mg/dL (ref 0.00–0.40)
TOTAL PROTEIN: 6.8 g/dL (ref 6.0–8.5)

## 2015-03-02 LAB — VITAMIN D 25 HYDROXY (VIT D DEFICIENCY, FRACTURES): Vit D, 25-Hydroxy: 14.1 ng/mL — ABNORMAL LOW (ref 30.0–100.0)

## 2015-03-02 LAB — TSH: TSH: 2.71 u[IU]/mL (ref 0.450–4.500)

## 2015-03-03 ENCOUNTER — Encounter: Payer: Self-pay | Admitting: Obstetrics and Gynecology

## 2015-03-03 ENCOUNTER — Encounter: Payer: Self-pay | Admitting: *Deleted

## 2015-03-03 ENCOUNTER — Ambulatory Visit (INDEPENDENT_AMBULATORY_CARE_PROVIDER_SITE_OTHER): Payer: 59 | Admitting: Obstetrics and Gynecology

## 2015-03-03 ENCOUNTER — Other Ambulatory Visit: Payer: Self-pay | Admitting: *Deleted

## 2015-03-03 ENCOUNTER — Ambulatory Visit
Admission: RE | Admit: 2015-03-03 | Discharge: 2015-03-03 | Disposition: A | Discharge status: Home / Self Care | Payer: 59 | Source: Ambulatory Visit | Attending: Internal Medicine | Admitting: Internal Medicine

## 2015-03-03 VITALS — BP 150/86 | HR 85 | Resp 16 | Ht 67.25 in | Wt 216.5 lb

## 2015-03-03 DIAGNOSIS — Z1239 Encounter for other screening for malignant neoplasm of breast: Secondary | ICD-10-CM

## 2015-03-03 DIAGNOSIS — Z1231 Encounter for screening mammogram for malignant neoplasm of breast: Secondary | ICD-10-CM | POA: Insufficient documentation

## 2015-03-03 DIAGNOSIS — Z87442 Personal history of urinary calculi: Secondary | ICD-10-CM

## 2015-03-03 DIAGNOSIS — N952 Postmenopausal atrophic vaginitis: Secondary | ICD-10-CM | POA: Diagnosis not present

## 2015-03-03 DIAGNOSIS — N390 Urinary tract infection, site not specified: Secondary | ICD-10-CM

## 2015-03-03 LAB — URINALYSIS, COMPLETE
BILIRUBIN UA: NEGATIVE
GLUCOSE, UA: NEGATIVE
KETONES UA: NEGATIVE
NITRITE UA: NEGATIVE
Protein, UA: NEGATIVE
RBC UA: NEGATIVE
SPEC GRAV UA: 1.02 (ref 1.005–1.030)
Urobilinogen, Ur: 0.2 mg/dL (ref 0.2–1.0)
pH, UA: 6 (ref 5.0–7.5)

## 2015-03-03 LAB — BLADDER SCAN AMB NON-IMAGING: Scan Result: 16

## 2015-03-03 LAB — MICROSCOPIC EXAMINATION: Renal Epithel, UA: NONE SEEN /HPF

## 2015-03-03 MED ORDER — ESTROGENS, CONJUGATED 0.625 MG/GM VA CREA: TOPICAL_CREAM | VAGINAL | Status: DC

## 2015-03-03 MED ORDER — VITAMIN D (ERGOCALCIFEROL) 1.25 MG (50000 UNIT) PO CAPS: 50000.0000 [IU] | ORAL_CAPSULE | ORAL | Status: DC

## 2015-03-03 NOTE — Progress Notes (Signed)
03/03/2015 3:27 PM   Susan Miller 1945-05-20 [id number]  Referring provider: Dale [place], MD [name] [place], [place] [address]  Chief Complaint  Patient presents with  . Recurrent UTI  . Establish Care    HPI: Patient is a 69yo female with a history of kidney stones presenting today as a referral for recurrent UTIs.  UTI symptoms include bladder pressure and dysuria. No symptoms today.  She is currently taking Augmentin. No fevers, nausea or vomiting.  Kidney stones one and only episode 5 years ago.  Stone passed with medical management.  Fluid intake 2-3 cups of coffee per day, 1 glass of water and 1 glass of tea.  No vaginal complaints.  Urine Cultures 02/26/15 E. Coli 10/14/14     E. Coli 09/25/14   E. Coli   PMH: Past Medical History  Diagnosis Date  . Depression   . Allergy   . Hypertension   . History of chicken pox   . Hx: UTI (urinary tract infection)     Surgical History: Past Surgical History  Procedure Laterality Date  . Tonsillectomy      as a child    Home Medications:    Medication List       This list is accurate as of: 03/03/15  3:27 PM.  Always use your most recent med list.               amoxicillin-clavulanate 250-62.5 MG/5ML suspension  Commonly known as:  AUGMENTIN  TAKE 5 ML BY MOUTH THREE TIMES A DAY, FOR 7 DAYS, DISCARD REMAINDER     conjugated estrogens vaginal cream  Commonly known as:  PREMARIN  Apply pea sized amount of cream to vaginal opening (urethra) M-W-F prior to bed     lisinopril-hydrochlorothiazide 10-12.5 MG tablet  Commonly known as:  PRINZIDE,ZESTORETIC  Take 1 tablet by mouth  daily     ranitidine 15 MG/ML syrup  Commonly known as:  ZANTAC  TAKE 10 MLS BY MOUTH 2 TIMES DAILY.     sertraline 25 MG tablet  Commonly known as:  ZOLOFT  Take 1 tablet by mouth  daily        Allergies:  Allergies  Allergen Reactions  . Codeine Hives  . Iodinated Diagnostic Agents Swelling   . Nitrofurantoin Monohyd Macro Nausea Only    Family History: Family History  Problem Relation Age of Onset  . Hypertension Mother   . Cancer Sister     colon cancer  . Hypertension Sister   . Diabetes Sister   . Hypertension Brother   . Cancer Maternal Aunt     breast cancer  . Hypertension Maternal Aunt   . Hypertension Maternal Uncle   . Stroke Maternal Grandmother   . Hypertension Maternal Grandmother     Social History:  reports that she has never smoked. She has never used smokeless tobacco. She reports that she does not drink alcohol or use illicit drugs.  ROS: UROLOGY Frequent Urination?: Yes Hard to postpone urination?: Yes Burning/pain with urination?: Yes Get up at night to urinate?: Yes Leakage of urine?: Yes Urine stream starts and stops?: No Trouble starting stream?: No Do you have to strain to urinate?: No Blood in urine?: No Urinary tract infection?: Yes Sexually transmitted disease?: No Injury to kidneys or bladder?: No Painful intercourse?: No Weak stream?: No Currently pregnant?: No Vaginal bleeding?: No Last menstrual period?: n/a  Gastrointestinal Nausea?: No Vomiting?: No Indigestion/heartburn?: Yes Diarrhea?: Yes Constipation?: Yes  Constitutional Fever:  No Night sweats?: No Weight loss?: No Fatigue?: Yes  Skin Skin rash/lesions?: No Itching?: No  Eyes Blurred vision?: No Double vision?: Yes  Ears/Nose/Throat Sore throat?: No Sinus problems?: Yes  Hematologic/Lymphatic Swollen glands?: No Easy bruising?: No  Cardiovascular Leg swelling?: No Chest pain?: No  Respiratory Cough?: No Shortness of breath?: No  Endocrine Excessive thirst?: No  Musculoskeletal Back pain?: No Joint pain?: No  Neurological Headaches?: No Dizziness?: No  Psychologic Depression?: Yes Anxiety?: Yes  Physical Exam: BP 150/86 mmHg  Pulse 85  Resp 16  Ht 5' 7.25" (1.708 m)  Wt 216 lb 8 oz (98.204 kg)  BMI 33.66 kg/m2   Constitutional:  Alert and oriented, No acute distress. HEENT:  AT, moist mucus membranes.  Trachea midline, no masses. Cardiovascular: No clubbing, cyanosis, or edema. Respiratory: Normal respiratory effort, no increased work of breathing. GI: Abdomen is soft, nontender, nondistended, no abdominal masses GU: No CVA tenderness.  Skin: No rashes, bruises or suspicious lesions. Lymph: No cervical or inguinal adenopathy. Neurologic: Grossly intact, no focal deficits, moving all 4 extremities. Psychiatric: Normal mood and affect.  Laboratory Data:   Urinalysis No results found for: COLORURINE, APPEARANCEUR, LABSPEC, PHURINE, GLUCOSEU, HGBUR, BILIRUBINUR, KETONESUR, PROTEINUR, UROBILINOGEN, NITRITE, LEUKOCYTESUR  Pertinent Imaging:   Assessment & Plan:    1. Recurrent UTI-  PVR 16mL. UTI prevention strategies discussed.  Good perineal hygiene reviewed. Patient is encouraged to increase daily water intake, start cranberry supplements to prevent invasive colonization along the urinary tract and probiotics, especially lactobacillus to restore normal vaginal flora. She is currently taking Augmentin.  Will perform catheterized specimen at f/u appt. - Urinalysis, Complete - BLADDER SCAN AMB NON-IMAGING  2. Vaginal Atrophy- Patient was given a prescription for vaginal estrogen cream and instructed to insert 0.5gm vaginally twice weekly and apply a pea-sized amount just inside the vaginal introitus to urethra with a finger-tip every night for two weeks and then Monday, Wednesday and Friday nights. General risks of HRT reviewed though I explained to the patient that vaginally administered estrogen, which causes only a slight increase in the blood estrogen levels, have fewer contraindications and adverse systemic effects that oral HT.   3. H/o kidney stones- KUB and RUS ordered to evaluate for recurrent renal calculi as nidus for infeciton.  Return for f/u for RUS and KUB results; CATH specimen do  not give cup.  These notes generated with voice recognition software. I apologize for typographical errors.  Susan LouLindsay Cassanda Walmer, FNP  [facility] [address], [address] [place], [place] [address] [phone]

## 2015-03-03 NOTE — Patient Instructions (Signed)

## 2015-03-04 ENCOUNTER — Ambulatory Visit: Payer: Self-pay | Admitting: Obstetrics and Gynecology

## 2015-03-15 ENCOUNTER — Ambulatory Visit (INDEPENDENT_AMBULATORY_CARE_PROVIDER_SITE_OTHER): Payer: 59 | Admitting: Internal Medicine

## 2015-03-15 ENCOUNTER — Encounter: Payer: Self-pay | Admitting: Internal Medicine

## 2015-03-15 VITALS — BP 110/70 | HR 86 | Temp 98.2°F | Resp 18 | Ht 67.25 in | Wt 216.0 lb

## 2015-03-15 DIAGNOSIS — N390 Urinary tract infection, site not specified: Secondary | ICD-10-CM

## 2015-03-15 DIAGNOSIS — K219 Gastro-esophageal reflux disease without esophagitis: Secondary | ICD-10-CM | POA: Diagnosis not present

## 2015-03-15 DIAGNOSIS — I10 Essential (primary) hypertension: Secondary | ICD-10-CM

## 2015-03-15 DIAGNOSIS — F329 Major depressive disorder, single episode, unspecified: Secondary | ICD-10-CM

## 2015-03-15 DIAGNOSIS — F32A Depression, unspecified: Secondary | ICD-10-CM

## 2015-03-15 DIAGNOSIS — Z0000 Encounter for general adult medical examination without abnormal findings: Secondary | ICD-10-CM

## 2015-03-15 DIAGNOSIS — E559 Vitamin D deficiency, unspecified: Secondary | ICD-10-CM

## 2015-03-15 MED ORDER — SERTRALINE HCL 50 MG PO TABS: 50.0000 mg | ORAL_TABLET | Freq: Every day | ORAL | Status: DC

## 2015-03-15 NOTE — Assessment & Plan Note (Signed)
Increased stress and depression as outlined.  Discussed with her today.  No suicidal ideations.  Increase zoloft to 50mg  q day.  Follow.  Get her back in soon to reassess.

## 2015-03-15 NOTE — Assessment & Plan Note (Signed)
On vitamin D supplements.  Follow.   

## 2015-03-15 NOTE — Assessment & Plan Note (Signed)
Blood pressure under good control.  Continue same medication regimen.  Follow pressures.  Follow metabolic panel.   

## 2015-03-15 NOTE — Assessment & Plan Note (Signed)
Physical today 03/15/15.  PAP 08/31/10 - negative with negative HPV.  Mammogram 03/04/15 - Birads I.

## 2015-03-15 NOTE — Assessment & Plan Note (Signed)
Increased acid reflux as outlined.  Zantac suspension.  Given the increased acid reflux, refer to GI for evaluation.

## 2015-03-15 NOTE — Progress Notes (Signed)
Pre-visit discussion using our clinic review tool. No additional management support is needed unless otherwise documented below in the visit note.  

## 2015-03-15 NOTE — Progress Notes (Signed)
[name], female   DOB: 1945-09-02, 69 y.o.   MRN: [id number]   Subjective:    Patient ID: Susan SensingLinda H Susan Miller, female    DOB: 1945-09-02, 69 y.o.   MRN: [id number]  HPI  Patient with past history of depression, hypertension and allergies.  She comes in today to follow up on these issues as well as for a complete physical exam.  She is under increased stress at work. Discussed with her today.  Discussed increasing zoloft.  No suicidal ideations.  Tries to stay active.  No cardiac symptoms with increased activity or exertion.  No sob.  Does report increased acid reflux.  Has not been taking any medication on a regular basis.  Feels acid coming up.  Discussed taking zantac suspension.  She is unable to swallow pills.  No dysphagia to food.  No abdominal pain or cramping.  Bowels stable.     Past Medical History  Diagnosis Date  . Depression   . Allergy   . Hypertension   . History of chicken pox   . Hx: UTI (urinary tract infection)    Past Surgical History  Procedure Laterality Date  . Tonsillectomy      as a child   Family History  Problem Relation Age of Onset  . Hypertension Mother   . Cancer Sister     colon cancer  . Hypertension Sister   . Diabetes Sister   . Hypertension Brother   . Cancer Maternal Aunt     breast cancer  . Hypertension Maternal Aunt   . Breast cancer Maternal Aunt 60  . Hypertension Maternal Uncle   . Stroke Maternal Grandmother   . Hypertension Maternal Grandmother    Social History   Social History  . Marital Status: Divorced    Spouse Name: N/A  . Number of Children: N/A  . Years of Education: N/A   Social History Main Topics  . Smoking status: Never Smoker   . Smokeless tobacco: Never Used  . Alcohol Use: No  . Drug Use: No  . Sexual Activity: Not Asked   Other Topics Concern  . None   Social History Narrative    Outpatient Encounter Prescriptions as of 03/15/2015  Medication Sig  . amoxicillin-clavulanate  (AUGMENTIN) 250-62.5 MG/5ML suspension TAKE 5 ML BY MOUTH THREE TIMES A DAY, FOR 7 DAYS, DISCARD REMAINDER  . conjugated estrogens (PREMARIN) vaginal cream Apply pea sized amount of cream to vaginal opening (urethra) M-W-F prior to bed  . lisinopril-hydrochlorothiazide (PRINZIDE,ZESTORETIC) 10-12.5 MG per tablet Take 1 tablet by mouth  daily  . ranitidine (ZANTAC) 15 MG/ML syrup TAKE 10 MLS BY MOUTH 2 TIMES DAILY. (Patient not taking: Reported on 03/03/2015)  . sertraline (ZOLOFT) 50 MG tablet Take 1 tablet (50 mg total) by mouth daily.  . Vitamin D, Ergocalciferol, (DRISDOL) 50000 UNITS CAPS capsule Take 1 capsule (50,000 Units total) by mouth every 7 (seven) days.  . [DISCONTINUED] sertraline (ZOLOFT) 25 MG tablet Take 1 tablet by mouth  daily   No facility-administered encounter medications on file as of 03/15/2015.    Review of Systems  Constitutional: Negative for appetite change and unexpected weight change.  HENT: Negative for congestion and sinus pressure.   Eyes: Negative for pain and visual disturbance.  Respiratory: Negative for cough, chest tightness and shortness of breath.   Cardiovascular: Negative for chest pain, palpitations and leg swelling.  Gastrointestinal: Negative for nausea, vomiting, abdominal pain and diarrhea.       Increased  acid reflux as outlined.    Genitourinary: Negative for dysuria and difficulty urinating.  Musculoskeletal: Negative for back pain and joint swelling.  Skin: Negative for color change and rash.  Neurological: Negative for dizziness, light-headedness and headaches.  Hematological: Negative for adenopathy. Does not bruise/bleed easily.  Psychiatric/Behavioral: Negative for dysphoric mood and agitation.       Increased stress as outlined.  Some increased depression.  No suicidal ideations.         Objective:    Physical Exam  Constitutional: She is oriented to person, place, and time. She appears well-developed and well-nourished. No  distress.  HENT:  Nose: Nose normal.  Mouth/Throat: Oropharynx is clear and moist.  Eyes: Right eye exhibits no discharge. Left eye exhibits no discharge. No scleral icterus.  Neck: Neck supple. No thyromegaly present.  Cardiovascular: Normal rate and regular rhythm.   Pulmonary/Chest: Breath sounds normal. No accessory muscle usage. No tachypnea. No respiratory distress. She has no decreased breath sounds. She has no wheezes. She has no rhonchi. Right breast exhibits no inverted nipple, no mass, no nipple discharge and no tenderness (no axillary adenopathy). Left breast exhibits no inverted nipple, no mass, no nipple discharge and no tenderness (no axilarry adenopathy).  Abdominal: Soft. Bowel sounds are normal. There is no tenderness.  Genitourinary:  Not performed.    Musculoskeletal: She exhibits no edema or tenderness.  Lymphadenopathy:    She has no cervical adenopathy.  Neurological: She is alert and oriented to person, place, and time.  Skin: Skin is warm. No rash noted. No erythema.  Psychiatric: She has a normal mood and affect. Her behavior is normal.    BP 110/70 mmHg  Pulse 86  Temp(Src) 98.2 F (36.8 C) (Oral)  Resp 18  Ht 5' 7.25" (1.708 m)  Wt 216 lb (97.977 kg)  BMI 33.59 kg/m2  SpO2 97% Wt Readings from Last 3 Encounters:  03/15/15 216 lb (97.977 kg)  03/03/15 216 lb 8 oz (98.204 kg)  02/22/15 215 lb (97.523 kg)     Lab Results  Component Value Date   WBC 6.4 03/01/2015   HGB 12.7 06/30/2014   HCT 37.7 03/01/2015   PLT 258.0 06/30/2014   GLUCOSE 104* 03/01/2015   CHOL 183 03/01/2015   TRIG 72 03/01/2015   HDL 51 03/01/2015   LDLCALC 118* 03/01/2015   ALT 11 03/01/2015   AST 13 03/01/2015   NA 143 03/01/2015   K 4.2 03/01/2015   CL 102 03/01/2015   CREATININE 0.80 03/01/2015   BUN 14 03/01/2015   CO2 27 03/01/2015   TSH 2.710 03/01/2015    Mm Digital Screening Bilateral  03/04/2015  CLINICAL DATA:  Screening. EXAM: DIGITAL SCREENING  BILATERAL MAMMOGRAM WITH CAD COMPARISON:  Previous exam(s). ACR Breast Density Category b: There are scattered areas of fibroglandular density. FINDINGS: There are no findings suspicious for malignancy. Images were processed with CAD. IMPRESSION: No mammographic evidence of malignancy. A result letter of this screening mammogram will be mailed directly to the patient. RECOMMENDATION: Screening mammogram in one year. (Code:SM-B-01Y) BI-RADS CATEGORY  1: Negative. Electronically Signed   By: Hulan Saas M.D.   On: 03/04/2015 08:11       Assessment & Plan:   Problem List Items Addressed This Visit    Depression    Increased stress and depression as outlined.  Discussed with her today.  No suicidal ideations.  Increase zoloft to  q day.  Follow.  Get her back in soon to reassess.  Relevant Medications   sertraline (ZOLOFT) 50 MG tablet   Essential hypertension, benign - Primary    Blood pressure under good control.  Continue same medication regimen.  Follow pressures.  Follow metabolic panel.        Frequent UTI    Has had reoccurring uti's.  Already referred to urology.  No symptoms today.       GERD (gastroesophageal reflux disease)    Increased acid reflux as outlined.  Zantac suspension.  Given the increased acid reflux, refer to GI for evaluation.        Health care maintenance    Physical today 03/15/15.  PAP 08/31/10 - negative with negative HPV.  Mammogram 03/04/15 - Birads I.        Vitamin D deficiency    On vitamin D supplements.  Follow.           Dale Harper, MD

## 2015-03-15 NOTE — Assessment & Plan Note (Signed)
Has had reoccurring uti's.  Already referred to urology.  No symptoms today.

## 2015-03-22 ENCOUNTER — Encounter: Payer: Self-pay | Admitting: *Deleted

## 2015-03-23 ENCOUNTER — Ambulatory Visit: Payer: 59 | Admitting: Urology

## 2015-03-24 ENCOUNTER — Ambulatory Visit
Admission: RE | Admit: 2015-03-24 | Discharge: 2015-03-24 | Disposition: A | Discharge status: Home / Self Care | Payer: 59 | Source: Ambulatory Visit | Attending: Obstetrics and Gynecology | Admitting: Obstetrics and Gynecology

## 2015-03-24 DIAGNOSIS — Z87442 Personal history of urinary calculi: Secondary | ICD-10-CM

## 2015-03-24 DIAGNOSIS — N390 Urinary tract infection, site not specified: Secondary | ICD-10-CM | POA: Insufficient documentation

## 2015-03-26 ENCOUNTER — Ambulatory Visit (INDEPENDENT_AMBULATORY_CARE_PROVIDER_SITE_OTHER): Payer: 59 | Admitting: Urology

## 2015-03-26 ENCOUNTER — Encounter: Payer: Self-pay | Admitting: Urology

## 2015-03-26 VITALS — BP 159/81 | HR 101 | Ht 67.0 in | Wt 215.0 lb

## 2015-03-26 DIAGNOSIS — N390 Urinary tract infection, site not specified: Secondary | ICD-10-CM

## 2015-03-26 LAB — URINALYSIS, COMPLETE
BILIRUBIN UA: NEGATIVE
GLUCOSE, UA: NEGATIVE
KETONES UA: NEGATIVE
Nitrite, UA: NEGATIVE
PROTEIN UA: NEGATIVE
SPEC GRAV UA: 1.015 (ref 1.005–1.030)
Urobilinogen, Ur: 0.2 mg/dL (ref 0.2–1.0)
pH, UA: 6 (ref 5.0–7.5)

## 2015-03-26 LAB — MICROSCOPIC EXAMINATION
Epithelial Cells (non renal): NONE SEEN /hpf (ref 0–10)
RBC, UA: NONE SEEN /hpf (ref 0–?)

## 2015-03-26 NOTE — Progress Notes (Signed)
In and Out Catheterization  Patient is present today for a I & O catheterization due to recurrent UTI. Patient was cleaned and prepped in a sterile fashion with betadine and Lidocaine 2% jelly was instilled into the urethra.  A 16FR cath was inserted no complications were noted , 50ml of urine return was noted, urine was yellow in color. A clean urine sample was collected for urinalysis. Bladder was drained  And catheter was removed with out difficulty.    Preformed by: Eligha BridegroomSarah Watts, CMA

## 2015-03-26 NOTE — Progress Notes (Signed)
5:55 PM  03/26/2015  Susan Miller March 20, 1946 [id number]  Referring provider: Dale Delmita, MD [address] [place], [place] [address]  Chief Complaint  Patient presents with  . Results    HPI:  69 year old female with recurrent urinary tract infections who returned today for routine follow-up. She had a renal ultrasound in the interim which shows no evidence of stones , masses, or hydronephrosis.   She has started vaginal estrogen cream since her last visit.   She is also started taking the cranberry chew for UTI prevention as well as a probiotic.  She has been using Azo as needed.   She does admit to having some very mild UTI type symptoms over the past few days including some mild urinary discomfort,  An increased urgency. No hematuria, fevers, or chills.  Kidney stones one and only episode 5 years ago.  Stone passed with medical management..  Urine Cultures 02/26/15 E. Coli 10/14/14     E. Coli 09/25/14   E. Coli   PMH: Past Medical History  Diagnosis Date  . Depression   . Allergy   . Hypertension   . History of chicken pox   . Hx: UTI (urinary tract infection)     Surgical History: Past Surgical History  Procedure Laterality Date  . Tonsillectomy      as a child    Home Medications:    Medication List       This list is accurate as of: 03/26/15  5:55 PM.  Always use your most recent med list.               amoxicillin-clavulanate 250-62.5 MG/5ML suspension  Commonly known as:  AUGMENTIN  TAKE 5 ML BY MOUTH THREE TIMES A DAY, FOR 7 DAYS, DISCARD REMAINDER     conjugated estrogens vaginal cream  Commonly known as:  PREMARIN  Apply pea sized amount of cream to vaginal opening (urethra) M-W-F prior to bed     lisinopril-hydrochlorothiazide 10-12.5 MG tablet  Commonly known as:  PRINZIDE,ZESTORETIC  Take 1 tablet by mouth  daily     ranitidine 15 MG/ML syrup  Commonly known as:  ZANTAC  TAKE 10 MLS BY MOUTH 2 TIMES DAILY.      sertraline 50 MG tablet  Commonly known as:  ZOLOFT  Take 1 tablet (50 mg total) by mouth daily.     Vitamin D (Ergocalciferol) 50000 UNITS Caps capsule  Commonly known as:  DRISDOL  Take 1 capsule (50,000 Units total) by mouth every 7 (seven) days.        Allergies:  Allergies  Allergen Reactions  . Codeine Hives  . Iodinated Diagnostic Agents Swelling  . Nitrofurantoin Monohyd Macro Nausea Only    Family History: Family History  Problem Relation Age of Onset  . Hypertension Mother   . Cancer Sister     colon cancer  . Hypertension Sister   . Diabetes Sister   . Hypertension Brother   . Cancer Maternal Aunt     breast cancer  . Hypertension Maternal Aunt   . Breast cancer Maternal Aunt 60  . Hypertension Maternal Uncle   . Stroke Maternal Grandmother   . Hypertension Maternal Grandmother     Social History:  reports that she has never smoked. She has never used smokeless tobacco. She reports that she does not drink alcohol or use illicit drugs.  ROS: UROLOGY Frequent Urination?: Yes Hard to postpone urination?: Yes Burning/pain with urination?: Yes Get up at night to  urinate?: No Leakage of urine?: Yes Urine stream starts and stops?: No Trouble starting stream?: No Do you have to strain to urinate?: No Blood in urine?: No Urinary tract infection?: Yes Sexually transmitted disease?: No Injury to kidneys or bladder?: No Painful intercourse?: No Weak stream?: No Currently pregnant?: No Vaginal bleeding?: No Last menstrual period?: n  Gastrointestinal Nausea?: No Vomiting?: No Indigestion/heartburn?: No Diarrhea?: No Constipation?: No  Constitutional Fever: No Night sweats?: No Weight loss?: No Fatigue?: No  Skin Skin rash/lesions?: No Itching?: No  Eyes Blurred vision?: No Double vision?: No  Ears/Nose/Throat Sore throat?: No Sinus problems?: No  Hematologic/Lymphatic Swollen glands?: No Easy bruising?:  No  Cardiovascular Leg swelling?: No Chest pain?: No  Respiratory Cough?: No Shortness of breath?: No  Endocrine Excessive thirst?: No  Musculoskeletal Back pain?: No Joint pain?: No  Neurological Headaches?: No Dizziness?: No  Psychologic Depression?: No Anxiety?: No  Physical Exam: BP 159/81 mmHg  Pulse 101  Ht 5\' 7"  (1.702 m)  Wt 215 lb (97.523 kg)  BMI 33.67 kg/m2  Constitutional:  Alert and oriented, No acute distress. HEENT: [place] AT, moist mucus membranes.  Trachea midline, no masses. Cardiovascular: No clubbing, cyanosis, or edema. Respiratory: Normal respiratory effort, no increased work of breathing. Skin: No rashes, bruises or suspicious lesions. Neurologic: Grossly intact, no focal deficits, moving all 4 extremities. Psychiatric: Normal mood and affect.  Laboratory Data:   Urinalysis Results for orders placed or performed in visit on 03/26/15  Microscopic Examination  Result Value Ref Range   WBC, UA 6-10 (A) 0 -  5 /hpf   RBC, UA None seen 0 -  2 /hpf   Epithelial Cells (non renal) None seen 0 - 10 /hpf   Bacteria, UA Few (A) None seen/Few  Urinalysis, Complete  Result Value Ref Range   Specific Gravity, UA 1.015 1.005 - 1.030   pH, UA 6.0 5.0 - 7.5   Color, UA Yellow Yellow   Appearance Ur Clear Clear   Leukocytes, UA 2+ (A) Negative   Protein, UA Negative Negative/Trace   Glucose, UA Negative Negative   Ketones, UA Negative Negative   RBC, UA Trace (A) Negative   Bilirubin, UA Negative Negative   Urobilinogen, Ur 0.2 0.2 - 1.0 mg/dL   Nitrite, UA Negative Negative   Microscopic Examination See below:     Pertinent Imaging: RUS 03/24/15 CLINICAL DATA: Recurrent urinary tract infections, history of kidney stones  EXAM: RENAL / URINARY TRACT ULTRASOUND COMPLETE  COMPARISON: None.  FINDINGS: Right Kidney:  Length: 12.1 cm. No hydronephrosis is seen.  Left Kidney:  Length: 11.0 cm. No hydronephrosis is  noted.  Bladder:  The urinary bladder is unremarkable.  IMPRESSION: Negative ultrasound of the kidneys.   Electronically Signed  By: Dwyane DeePaul Barry M.D.  On: 03/24/2015 12:57  Assessment & Plan:   69 year old female with recurrent urinary tract infections. Workup including renal ultrasound shows no obvious nidus for infection. No elevated postvoid residuals.   Mildly symptomatic today although UA not particularly suspicious.  1. Recurrent UTI-  - continue vaginal estrogen cream, probiotic, and cranberry tabs - patient catheterized today for clean sample to assess for presence of infection given her mild symptoms, will treat as needed - recommend OTC cystex plus for ppx/ symptoms  2. Vaginal Atrophy- As above  3. H/o kidney stones- No evidence of stones on RUS.  Return in about 6 months (around 09/23/2015) for symptoms recheck Lillia Abed(Lindsay).   Vanna ScotlandAshley Treshon Stannard, MD  [facility] [address] [place], [place] [address] [phone]

## 2015-03-28 ENCOUNTER — Other Ambulatory Visit: Payer: Self-pay | Admitting: Internal Medicine

## 2015-03-28 LAB — CULTURE, URINE COMPREHENSIVE

## 2015-03-29 ENCOUNTER — Telehealth: Payer: Self-pay | Admitting: Urology

## 2015-03-29 MED ORDER — SULFAMETHOXAZOLE-TRIMETHOPRIM 800-160 MG PO TABS: 1.0000 | ORAL_TABLET | Freq: Two times a day (BID) | ORAL | Status: DC

## 2015-03-29 NOTE — Telephone Encounter (Signed)
Please let this patient know that she grew pansensitive E. Coli in her urine.  I have called in Bactrim DS bid x 7 days to her pharmacy.    Vanna ScotlandAshley Allyana Vogan, MD

## 2015-03-29 NOTE — Telephone Encounter (Signed)
Spoke with pt in reference to +ucx. Pt stated she needs a liquid medication. Please advise.

## 2015-03-29 NOTE — Telephone Encounter (Signed)
Call pharmacy and see if they can do that.    Vanna ScotlandAshley Nelson Noone, MD

## 2015-03-30 NOTE — Telephone Encounter (Signed)
Spoke with pt pharmacy, [facility], and was able to give a verbal order of Bactrim DS in liquid form. Pt made aware.

## 2015-04-12 ENCOUNTER — Other Ambulatory Visit: Payer: Self-pay | Admitting: Internal Medicine

## 2015-04-20 ENCOUNTER — Ambulatory Visit: Payer: 59 | Admitting: Internal Medicine

## 2015-09-24 ENCOUNTER — Ambulatory Visit: Payer: 59

## 2015-10-18 ENCOUNTER — Telehealth: Payer: Self-pay | Admitting: Internal Medicine

## 2015-10-18 NOTE — Telephone Encounter (Signed)
Do you want to see her? Last OV was in November 2016? thanks

## 2015-10-18 NOTE — Telephone Encounter (Signed)
Pt sent a Mychart message requesting to come in to get her blood check to see why I am so tired.  And my hair is really starting to come out and it is bothering me big time. Need orders please and thank you!

## 2015-10-19 NOTE — Telephone Encounter (Signed)
I can see her at 9:00 (block 30 min) on 10/21/15.  Can order needed labs then.

## 2015-10-19 NOTE — Telephone Encounter (Signed)
Left a VM to return my call to schedule. thanks

## 2015-10-20 NOTE — Telephone Encounter (Signed)
Attempted to reach patient again, left VM.

## 2015-10-21 ENCOUNTER — Other Ambulatory Visit: Payer: Self-pay | Admitting: Internal Medicine

## 2015-10-21 ENCOUNTER — Encounter: Payer: Self-pay | Admitting: Internal Medicine

## 2015-10-21 ENCOUNTER — Ambulatory Visit (INDEPENDENT_AMBULATORY_CARE_PROVIDER_SITE_OTHER): Payer: 59 | Admitting: Internal Medicine

## 2015-10-21 VITALS — BP 120/70 | HR 75 | Temp 98.2°F | Resp 17 | Ht 67.0 in | Wt 217.8 lb

## 2015-10-21 DIAGNOSIS — E559 Vitamin D deficiency, unspecified: Secondary | ICD-10-CM

## 2015-10-21 DIAGNOSIS — R5383 Other fatigue: Secondary | ICD-10-CM | POA: Diagnosis not present

## 2015-10-21 DIAGNOSIS — I10 Essential (primary) hypertension: Secondary | ICD-10-CM

## 2015-10-21 DIAGNOSIS — F32A Depression, unspecified: Secondary | ICD-10-CM

## 2015-10-21 DIAGNOSIS — Z1239 Encounter for other screening for malignant neoplasm of breast: Secondary | ICD-10-CM

## 2015-10-21 DIAGNOSIS — L659 Nonscarring hair loss, unspecified: Secondary | ICD-10-CM

## 2015-10-21 DIAGNOSIS — F329 Major depressive disorder, single episode, unspecified: Secondary | ICD-10-CM

## 2015-10-21 LAB — CBC WITH DIFFERENTIAL/PLATELET
BASOS PCT: 0.4 % (ref 0.0–3.0)
Basophils Absolute: 0 10*3/uL (ref 0.0–0.1)
EOS PCT: 2.4 % (ref 0.0–5.0)
Eosinophils Absolute: 0.1 10*3/uL (ref 0.0–0.7)
HCT: 36.9 % (ref 36.0–46.0)
Hemoglobin: 12.4 g/dL (ref 12.0–15.0)
LYMPHS ABS: 2.7 10*3/uL (ref 0.7–4.0)
Lymphocytes Relative: 47 % — ABNORMAL HIGH (ref 12.0–46.0)
MCHC: 33.7 g/dL (ref 30.0–36.0)
MCV: 90.4 fl (ref 78.0–100.0)
MONO ABS: 0.5 10*3/uL (ref 0.1–1.0)
MONOS PCT: 8.6 % (ref 3.0–12.0)
NEUTROS ABS: 2.4 10*3/uL (ref 1.4–7.7)
NEUTROS PCT: 41.6 % — AB (ref 43.0–77.0)
Platelets: 246 10*3/uL (ref 150.0–400.0)
RBC: 4.08 Mil/uL (ref 3.87–5.11)
RDW: 13.4 % (ref 11.5–15.5)
WBC: 5.7 10*3/uL (ref 4.0–10.5)

## 2015-10-21 LAB — LIPID PANEL
CHOLESTEROL: 170 mg/dL (ref 0–200)
HDL: 49.7 mg/dL (ref >39.00)
LDL Cholesterol: 105 mg/dL — ABNORMAL HIGH (ref 0–99)
NonHDL: 120.07
TRIGLYCERIDES: 75 mg/dL (ref 0.0–149.0)
Total CHOL/HDL Ratio: 3
VLDL: 15 mg/dL (ref 0.0–40.0)

## 2015-10-21 LAB — BASIC METABOLIC PANEL
BUN: 14 mg/dL (ref 6–23)
CALCIUM: 9.9 mg/dL (ref 8.4–10.5)
CO2: 30 mEq/L (ref 19–32)
CREATININE: 1.03 mg/dL (ref 0.40–1.20)
Chloride: 106 mEq/L (ref 96–112)
GFR: 56.37 mL/min — AB (ref >60.00)
Glucose, Bld: 101 mg/dL — ABNORMAL HIGH (ref 70–99)
Potassium: 4.7 mEq/L (ref 3.5–5.1)
SODIUM: 140 meq/L (ref 135–145)

## 2015-10-21 LAB — HEPATIC FUNCTION PANEL
ALBUMIN: 4.4 g/dL (ref 3.5–5.2)
ALK PHOS: 62 U/L (ref 39–117)
ALT: 14 U/L (ref 0–35)
AST: 14 U/L (ref 0–37)
BILIRUBIN DIRECT: 0.1 mg/dL (ref 0.0–0.3)
TOTAL PROTEIN: 7.2 g/dL (ref 6.0–8.3)
Total Bilirubin: 0.5 mg/dL (ref 0.2–1.2)

## 2015-10-21 LAB — VITAMIN D 25 HYDROXY (VIT D DEFICIENCY, FRACTURES): VITD: 18.49 ng/mL — ABNORMAL LOW (ref 30.00–100.00)

## 2015-10-21 LAB — TSH: TSH: 1.89 u[IU]/mL (ref 0.35–4.50)

## 2015-10-21 NOTE — Telephone Encounter (Signed)
At todays visit. Pt stated that pharmacy keeps refilling the 25mg . But 50mg  was written last. However, she has been taking the 25mg  & doing okay. Please advise if I need to refill the 25mg  or 50mg . Wasn't sure if you talked to her about it at her appt today.

## 2015-10-21 NOTE — Telephone Encounter (Signed)
I did not, just clarify with her if she wants to stay on 25mg  or do the 50mg .  I will do either way.

## 2015-10-21 NOTE — Progress Notes (Signed)
[name] Pisani, female   DOB: 12-31-1945, 70 y.o.   MRN: [id number]   Subjective:    [name] Antolin, female    DOB: 12-31-1945, 70 y.o.   MRN: [id number]  HPI  Patient here as a work in with concerns regarding increased fatigue.  She also is concerned about hair loss.  States has been present over the last few months.  Reports increased stress at work.  Discussed with her today.  Feels she is handling things ok.  On zoloft.  Feel working ok.  Only taking 25mg  per day.  Also increased stress with her son's issues.  Again feels handling things ok.  She is going to some counseling with him.  Does not feel needs anything more.  She does feel rested when wakes up - most days.  Eating .  No chest pain.  No vomiting.  Bowels stable.     Past Medical History  Diagnosis Date  . Depression   . Allergy   . Hypertension   . History of chicken pox   . Hx: UTI (urinary tract infection)    Past Surgical History  Procedure Laterality Date  . Tonsillectomy      as a child   Family History  Problem Relation Age of Onset  . Hypertension Mother   . Cancer Sister     colon cancer  . Hypertension Sister   . Diabetes Sister   . Hypertension Brother   . Cancer Maternal Aunt     breast cancer  . Hypertension Maternal Aunt   . Breast cancer Maternal Aunt 60  . Hypertension Maternal Uncle   . Stroke Maternal Grandmother   . Hypertension Maternal Grandmother    Social History   Social History  . Marital Status: Divorced    Spouse Name: N/A  . Number of Children: N/A  . Years of Education: N/A   Social History Main Topics  . Smoking status: Never Smoker   . Smokeless tobacco: Never Used  . Alcohol Use: No  . Drug Use: No  . Sexual Activity: Not Asked   Other Topics Concern  . None   Social History Narrative    Outpatient Encounter Prescriptions as of 10/21/2015  Medication Sig  . conjugated estrogens (PREMARIN) vaginal cream Apply pea sized amount of cream to  vaginal opening (urethra) M-W-F prior to bed  . lisinopril-hydrochlorothiazide (PRINZIDE,ZESTORETIC) 10-12.5 MG tablet Take 1 tablet by mouth  daily  . ranitidine (ZANTAC) 15 MG/ML syrup TAKE 10 MLS BY MOUTH 2 TIMES DAILY.  Marland Kitchen. sulfamethoxazole-trimethoprim (BACTRIM DS,SEPTRA DS) 800-160 MG tablet Take 1 tablet by mouth every 12 (twelve) hours.  . Vitamin D, Ergocalciferol, (DRISDOL) 50000 UNITS CAPS capsule Take 1 capsule (50,000 Units total) by mouth every 7 (seven) days.  . [DISCONTINUED] sertraline (ZOLOFT) 25 MG tablet Take 1 tablet by mouth  daily  . [DISCONTINUED] amoxicillin-clavulanate (AUGMENTIN) 250-62.5 MG/5ML suspension TAKE 5 ML BY MOUTH THREE TIMES A DAY, FOR 7 DAYS, DISCARD REMAINDER  . [DISCONTINUED] sertraline (ZOLOFT) 50 MG tablet Take 1 tablet (50 mg total) by mouth daily. (Patient not taking: Reported on 10/21/2015)   No facility-administered encounter medications on file as of 10/21/2015.    Review of Systems  Constitutional: Positive for fatigue. Negative for appetite change and unexpected weight change.  HENT: Negative for congestion and sinus pressure.   Respiratory: Negative for cough, chest tightness and shortness of breath.   Cardiovascular: Negative for chest pain, palpitations and leg swelling.  Gastrointestinal:  Negative for nausea, vomiting, abdominal pain and diarrhea.  Genitourinary: Negative for dysuria and difficulty urinating.  Musculoskeletal: Negative for back pain and joint swelling.  Skin: Negative for color change and rash.  Neurological: Negative for dizziness and headaches.  Psychiatric/Behavioral: Negative for dysphoric mood and agitation.       Increased stress as outlined.         Objective:    Physical Exam  Constitutional: She appears well-developed and well-nourished. No distress.  HENT:  Nose: Nose normal.  Mouth/Throat: Oropharynx is clear and moist.  Neck: Neck supple. No thyromegaly present.  Cardiovascular: Normal rate and regular  rhythm.   Pulmonary/Chest: Breath sounds normal. No respiratory distress. She has no wheezes.  Abdominal: Soft. Bowel sounds are normal. There is no tenderness.  Musculoskeletal: She exhibits no edema or tenderness.  Lymphadenopathy:    She has no cervical adenopathy.  Skin: No rash noted. No erythema.  Psychiatric: She has a normal mood and affect. Her behavior is normal.    BP 120/70 mmHg  Pulse 75  Temp(Src) 98.2 F (36.8 C) (Oral)  Resp 17  Ht 5\' 7"  (1.702 m)  Wt 217 lb 12 oz (98.771 kg)  BMI 34.10 kg/m2  SpO2 98% Wt Readings from Last 3 Encounters:  10/21/15 217 lb 12 oz (98.771 kg)  03/26/15 215 lb (97.523 kg)  03/15/15 216 lb (97.977 kg)     Lab Results  Component Value Date   WBC 5.7 10/21/2015   HGB 12.4 10/21/2015   HCT 36.9 10/21/2015   PLT 246.0 10/21/2015   GLUCOSE 101* 10/21/2015   CHOL 170 10/21/2015   TRIG 75.0 10/21/2015   HDL 49.70 10/21/2015   LDLCALC 105* 10/21/2015   ALT 14 10/21/2015   AST 14 10/21/2015   NA 140 10/21/2015   K 4.7 10/21/2015   CL 106 10/21/2015   CREATININE 1.03 10/21/2015   BUN 14 10/21/2015   CO2 30 10/21/2015   TSH 1.89 10/21/2015    US Renal  03/24/2015  CLINICAL DATA:  Recurrent urinary tract infections, history of kidney stones EXAM: RENAL / URINARY TRACT ULTRASOUND COMPLETE COMPARISON:  None. FINDINGS: Right Kidney: Length: 12.1 cm.  No hydronephrosis is seen. Left Kidney: Length: 11.0 cm.  No hydronephrosis is noted. Bladder: The urinary bladder is unremarkable. IMPRESSION: Negative ultrasound of the kidneys. Electronically Signed   By: Dwyane Dee M.D.   On: 03/24/2015 12:57       Assessment & Plan:   Problem List Items Addressed This Visit    Depression    Increased stress as outlined.  On zoloft. Does not feel needs anything more.  Discussed counseling.  Going some with her son.  Will let me know if any problems.        Essential hypertension, benign    Blood pressure under good control.  Continue same  medication regimen.  Follow pressures.  Follow metabolic panel.        Relevant Orders   Lipid panel (Completed)   Basic metabolic panel (Completed)   Hepatic function panel (Completed)   Fatigue    Probably multifactorial.  Check cbc, metabolic panel and tsh.  Treat depression and discussed her stress.  Wants to hold on any further intervention at this time.  Discussed sleep apnea.  She feels overall sleeping well.  Follow.        Relevant Orders   TSH (Completed)   CBC with Differential/Platelet (Completed)   Hair loss    Check routine labs including thyroid  as outlined.  Follow.       Vitamin D deficiency    Check vitamin D level.        Relevant Orders   VITAMIN D 25 Hydroxy (Vit-D Deficiency, Fractures) (Completed)    Other Visit Diagnoses    Screening breast examination    -  Primary    Relevant Orders    MM DIGITAL SCREENING BILATERAL        Dale [place], MD

## 2015-10-21 NOTE — Progress Notes (Signed)
Pre-visit discussion using our clinic review tool. No additional management support is needed unless otherwise documented below in the visit note.  

## 2015-10-21 NOTE — Telephone Encounter (Signed)
Scheduled for this morning, had sent a Mychart back to confirm, thanks

## 2015-10-22 ENCOUNTER — Other Ambulatory Visit: Payer: Self-pay | Admitting: Internal Medicine

## 2015-10-22 ENCOUNTER — Other Ambulatory Visit: Payer: Self-pay

## 2015-10-22 ENCOUNTER — Encounter: Payer: Self-pay | Admitting: *Deleted

## 2015-10-22 DIAGNOSIS — R7989 Other specified abnormal findings of blood chemistry: Secondary | ICD-10-CM

## 2015-10-22 MED ORDER — SERTRALINE HCL 25 MG PO TABS: ORAL_TABLET | ORAL | Status: DC

## 2015-10-22 NOTE — Telephone Encounter (Signed)
Patient called and stated she is taking 25mg . Refill has been done.

## 2015-10-22 NOTE — Telephone Encounter (Signed)
Left message to return call to office.

## 2015-10-22 NOTE — Telephone Encounter (Signed)
Medication refill. Please advise. 

## 2015-10-22 NOTE — Progress Notes (Signed)
Order placed for f/u met b.  

## 2015-10-26 ENCOUNTER — Encounter: Payer: Self-pay | Admitting: Internal Medicine

## 2015-10-26 DIAGNOSIS — L659 Nonscarring hair loss, unspecified: Secondary | ICD-10-CM | POA: Insufficient documentation

## 2015-10-26 NOTE — Assessment & Plan Note (Signed)
Blood pressure under good control.  Continue same medication regimen.  Follow pressures.  Follow metabolic panel.   

## 2015-10-26 NOTE — Assessment & Plan Note (Signed)
Check routine labs including thyroid as outlined.  Follow.

## 2015-10-26 NOTE — Assessment & Plan Note (Signed)
Check vitamin D level 

## 2015-10-26 NOTE — Assessment & Plan Note (Signed)
Increased stress as outlined.  On zoloft. Does not feel needs anything more.  Discussed counseling.  Going some with her son.  Will let me know if any problems.

## 2015-10-26 NOTE — Assessment & Plan Note (Signed)
Probably multifactorial.  Check cbc, metabolic panel and tsh.  Treat depression and discussed her stress.  Wants to hold on any further intervention at this time.  Discussed sleep apnea.  She feels overall sleeping well.  Follow.

## 2015-11-12 ENCOUNTER — Other Ambulatory Visit (INDEPENDENT_AMBULATORY_CARE_PROVIDER_SITE_OTHER): Payer: 59

## 2015-11-12 DIAGNOSIS — R748 Abnormal levels of other serum enzymes: Secondary | ICD-10-CM

## 2015-11-12 DIAGNOSIS — R7989 Other specified abnormal findings of blood chemistry: Secondary | ICD-10-CM

## 2015-11-12 NOTE — Addendum Note (Signed)
Addended by: Felix AhmadiFRANSEN, Nakia Remmers A on: 11/12/2015 10:58 AM   Modules accepted: Orders

## 2015-11-13 LAB — BASIC METABOLIC PANEL
BUN/Creatinine Ratio: 14 (ref 12–28)
BUN: 11 mg/dL (ref 8–27)
CO2: 23 mmol/L (ref 18–29)
CREATININE: 0.79 mg/dL (ref 0.57–1.00)
Calcium: 9.6 mg/dL (ref 8.7–10.3)
Chloride: 102 mmol/L (ref 96–106)
GFR calc Af Amer: 88 mL/min/{1.73_m2} (ref >59)
GFR, EST NON AFRICAN AMERICAN: 77 mL/min/{1.73_m2} (ref >59)
Glucose: 148 mg/dL — ABNORMAL HIGH (ref 65–99)
Potassium: 4.5 mmol/L (ref 3.5–5.2)
SODIUM: 143 mmol/L (ref 134–144)

## 2015-11-14 ENCOUNTER — Encounter: Payer: Self-pay | Admitting: Internal Medicine

## 2015-12-22 ENCOUNTER — Telehealth: Payer: Self-pay | Admitting: Internal Medicine

## 2015-12-22 NOTE — Telephone Encounter (Signed)
Pt sent a Mychart message regarding some health maintenances.   Is pt due for:  Hepatitis C Screening  Tetanus/Tdap  Colonoscopy  Dexa Scan     Do we have Pna Vac Low Risk Adult in?  Call pt @ [phone]. Thank you!

## 2015-12-22 NOTE — Telephone Encounter (Signed)
Per health maintenance due for the following, advise if you are okay with getting completed? thanks

## 2015-12-22 NOTE — Telephone Encounter (Signed)
Left a VM that she can discuss these with her PCP at next office visit.

## 2015-12-22 NOTE — Telephone Encounter (Signed)
I am ok with her getting these.  She has an appt with me on 01/11/16.  We can schedule and discuss at that appt if she is ok with this.

## 2016-01-11 ENCOUNTER — Encounter: Payer: Self-pay | Admitting: Internal Medicine

## 2016-01-11 ENCOUNTER — Ambulatory Visit (INDEPENDENT_AMBULATORY_CARE_PROVIDER_SITE_OTHER): Payer: 59 | Admitting: Internal Medicine

## 2016-01-11 VITALS — BP 100/64 | HR 92 | Temp 98.4°F | Resp 18 | Ht 67.0 in | Wt 220.0 lb

## 2016-01-11 DIAGNOSIS — F329 Major depressive disorder, single episode, unspecified: Secondary | ICD-10-CM

## 2016-01-11 DIAGNOSIS — Z9289 Personal history of other medical treatment: Secondary | ICD-10-CM

## 2016-01-11 DIAGNOSIS — N390 Urinary tract infection, site not specified: Secondary | ICD-10-CM

## 2016-01-11 DIAGNOSIS — K219 Gastro-esophageal reflux disease without esophagitis: Secondary | ICD-10-CM | POA: Diagnosis not present

## 2016-01-11 DIAGNOSIS — Z23 Encounter for immunization: Secondary | ICD-10-CM | POA: Diagnosis not present

## 2016-01-11 DIAGNOSIS — I10 Essential (primary) hypertension: Secondary | ICD-10-CM

## 2016-01-11 DIAGNOSIS — F32A Depression, unspecified: Secondary | ICD-10-CM

## 2016-01-11 DIAGNOSIS — Z9189 Other specified personal risk factors, not elsewhere classified: Principal | ICD-10-CM

## 2016-01-11 DIAGNOSIS — Z1159 Encounter for screening for other viral diseases: Secondary | ICD-10-CM

## 2016-01-11 DIAGNOSIS — Z2839 Other underimmunization status: Secondary | ICD-10-CM

## 2016-01-11 MED ORDER — RANITIDINE HCL 15 MG/ML PO SYRP: ORAL_SOLUTION | ORAL | 1 refills | Status: DC

## 2016-01-11 NOTE — Progress Notes (Addendum)
[name], female   DOB: 24-Mar-1946, 70 y.o.   MRN: [id number]   Subjective:    [name], female    DOB: [birth year], 70 y.o.   MRN: [id number]  HPI  Patient here for a scheduled follow up.  She reports she has been having increased acid reflux.  Unable to swallow pills or capsules.  Needs suspension.  Discussed restarting zantac suspension on a regular basis.  Discussed referral to GI given persistent intermittent flares of acid reflux.  She also had colonoscopy in 2008.  Some constipation issues.  Request to go ahead and have referral to discuss f/u colonoscopy.  No chest pain.  No sob.  No abdominal pain.     Past Medical History:  Diagnosis Date  . Allergy   . Depression   . History of chicken pox   . Hx: UTI (urinary tract infection)   . Hypertension    Past Surgical History:  Procedure Laterality Date  . TONSILLECTOMY     as a child   Family History  Problem Relation Age of Onset  . Hypertension Mother   . Cancer Sister     colon cancer  . Hypertension Sister   . Diabetes Sister   . Hypertension Brother   . Cancer Maternal Aunt     breast cancer  . Hypertension Maternal Aunt   . Breast cancer Maternal Aunt 60  . Hypertension Maternal Uncle   . Stroke Maternal Grandmother   . Hypertension Maternal Grandmother    Social History   Social History  . Marital status: Divorced    Spouse name: N/A  . Number of children: N/A  . Years of education: N/A   Social History Main Topics  . Smoking status: Never Smoker  . Smokeless tobacco: Never Used  . Alcohol use No  . Drug use: No  . Sexual activity: Not Asked   Other Topics Concern  . None   Social History Narrative  . None    Outpatient Encounter Prescriptions as of 01/11/2016  Medication Sig  . conjugated estrogens (PREMARIN) vaginal cream Apply pea sized amount of cream to vaginal opening (urethra) M-W-F prior to bed  . lisinopril-hydrochlorothiazide (PRINZIDE,ZESTORETIC)  10-12.5 MG tablet Take 1 tablet by mouth  daily  . ranitidine (ZANTAC) 15 MG/ML syrup TAKE 10 MLS BY MOUTH 2 TIMES DAILY.  Marland Kitchen sertraline (ZOLOFT) 25 MG tablet Take 1 tablet by mouth  daily  . [DISCONTINUED] ranitidine (ZANTAC) 15 MG/ML syrup TAKE 10 MLS BY MOUTH 2 TIMES DAILY.  . [DISCONTINUED] sulfamethoxazole-trimethoprim (BACTRIM DS,SEPTRA DS) 800-160 MG tablet Take 1 tablet by mouth every 12 (twelve) hours.  . [DISCONTINUED] Vitamin D, Ergocalciferol, (DRISDOL) 50000 UNITS CAPS capsule Take 1 capsule (50,000 Units total) by mouth every 7 (seven) days.   No facility-administered encounter medications on file as of 01/11/2016.     Review of Systems  Constitutional: Negative for appetite change and unexpected weight change.  HENT: Negative for congestion and sinus pressure.   Respiratory: Negative for cough, chest tightness and shortness of breath.   Cardiovascular: Negative for chest pain, palpitations and leg swelling.  Gastrointestinal: Positive for constipation. Negative for abdominal pain, diarrhea, nausea and vomiting.       Increased acid reflux.    Genitourinary: Negative for difficulty urinating and dysuria.  Musculoskeletal: Negative for back pain and joint swelling.  Skin: Negative for color change and rash.  Neurological: Negative for dizziness, light-headedness and headaches.  Psychiatric/Behavioral:  Increased stress with family issues.  Discussed with her today.  Her son in seeing a therapist.  She is encouraged by this.        Objective:     Blood pressure rechecked by me:  124/78  Physical Exam  Constitutional: She appears well-developed and well-nourished. No distress.  HENT:  Nose: Nose normal.  Mouth/Throat: Oropharynx is clear and moist.  Neck: Neck supple. No thyromegaly present.  Cardiovascular: Normal rate and regular rhythm.   Pulmonary/Chest: Breath sounds normal. No respiratory distress. She has no wheezes.  Abdominal: Soft. Bowel sounds are normal.  There is no tenderness.  Musculoskeletal: She exhibits no edema or tenderness.  Lymphadenopathy:    She has no cervical adenopathy.  Skin: No rash noted. No erythema.  Psychiatric: She has a normal mood and affect. Her behavior is normal.    BP 100/64   Pulse 92   Temp 98.4 F (36.9 C) (Oral)   Resp 18   Ht 5\' 7"  (1.702 m)   Wt 220 lb (99.8 kg)   SpO2 96%   BMI 34.46 kg/m  Wt Readings from Last 3 Encounters:  01/11/16 220 lb (99.8 kg)  10/21/15 217 lb 12 oz (98.8 kg)  03/26/15 215 lb (97.5 kg)     Lab Results  Component Value Date   WBC 5.7 10/21/2015   HGB 12.4 10/21/2015   HCT 36.9 10/21/2015   PLT 246.0 10/21/2015   GLUCOSE 148 (H) 11/12/2015   CHOL 170 10/21/2015   TRIG 75.0 10/21/2015   HDL 49.70 10/21/2015   LDLCALC 105 (H) 10/21/2015   ALT 14 10/21/2015   AST 14 10/21/2015   NA 143 11/12/2015   K 4.5 11/12/2015   CL 102 11/12/2015   CREATININE 0.79 11/12/2015   BUN 11 11/12/2015   CO2 23 11/12/2015   TSH 1.89 10/21/2015    US Renal  Result Date: 03/24/2015 CLINICAL DATA:  Recurrent urinary tract infections, history of kidney stones EXAM: RENAL / URINARY TRACT ULTRASOUND COMPLETE COMPARISON:  None. FINDINGS: Right Kidney: Length: 12.1 cm.  No hydronephrosis is seen. Left Kidney: Length: 11.0 cm.  No hydronephrosis is noted. Bladder: The urinary bladder is unremarkable. IMPRESSION: Negative ultrasound of the kidneys. Electronically Signed   By: Dwyane Dee M.D.   On: 03/24/2015 12:57       Assessment & Plan:   Problem List Items Addressed This Visit    Depression    Discussed with her today.  See note.  Her son is seeing a therapist.  Encouraged by this.  On zoloft.  D/w her regarding increasing to 50mg  q day.  Follow.        Essential hypertension, benign    Blood pressure under good control.  Continue same medication regimen.  Follow pressures.  Follow metabolic panel.        Relevant Orders   Hepatic function panel   Lipid panel   Basic  metabolic panel   Frequent UTI    No symptoms today.  Has been referred to GI.       GERD (gastroesophageal reflux disease)    Increased acid reflux as outlined.  Start zantac suspension as directed.  Discussed with her regarding persistent intermittent flares of acid reflux.  She had colonoscopy 2008.  She request referral back to GI for evaluation for colonoscopy.  Some constipation.  Refer to GI for evaluation for possible EGD and colonoscopy.        Relevant Medications   ranitidine (ZANTAC) 15 MG/ML  syrup    Other Visit Diagnoses    Immunizations incomplete    -  Primary   flu shot given today.  check varicella titer and hepatitis C.     Relevant Orders   Varicella zoster antibody, IgG   Encounter for immunization       Relevant Medications   ranitidine (ZANTAC) 15 MG/ML syrup   Other Relevant Orders   Flu vaccine HIGH DOSE PF (Completed)   Hepatic function panel   Lipid panel   Basic metabolic panel   Need for hepatitis C screening test       Relevant Orders   Hepatitis C antibody       Dale DurhamSCOTT, Chanin Frumkin, MD

## 2016-01-11 NOTE — Progress Notes (Signed)
Pre visit review using our clinic review tool, if applicable. No additional management support is needed unless otherwise documented below in the visit note. 

## 2016-01-16 ENCOUNTER — Encounter: Payer: Self-pay | Admitting: Internal Medicine

## 2016-01-16 NOTE — Assessment & Plan Note (Signed)
Increased acid reflux as outlined.  Start zantac suspension as directed.  Discussed with her regarding persistent intermittent flares of acid reflux.  She had colonoscopy 2008.  She request referral back to GI for evaluation for colonoscopy.  Some constipation.  Refer to GI for evaluation for possible EGD and colonoscopy.

## 2016-01-16 NOTE — Assessment & Plan Note (Signed)
Blood pressure under good control.  Continue same medication regimen.  Follow pressures.  Follow metabolic panel.   

## 2016-01-16 NOTE — Addendum Note (Signed)
Addended by: Charm BargesSCOTT, Dejaun Vidrio S on: 01/16/2016 07:55 AM   Modules accepted: Orders

## 2016-01-16 NOTE — Assessment & Plan Note (Signed)
No symptoms today.  Has been referred to GI.

## 2016-01-16 NOTE — Assessment & Plan Note (Signed)
Discussed with her today.  See note.  Her son is seeing a therapist.  Encouraged by this.  On zoloft.  D/w her regarding increasing to 50mg  q day.  Follow.

## 2016-01-24 ENCOUNTER — Other Ambulatory Visit: Payer: Self-pay | Admitting: Internal Medicine

## 2016-03-03 ENCOUNTER — Ambulatory Visit
Admission: RE | Admit: 2016-03-03 | Discharge: 2016-03-03 | Disposition: A | Discharge status: Home / Self Care | Payer: 59 | Source: Ambulatory Visit | Attending: Internal Medicine | Admitting: Internal Medicine

## 2016-03-03 ENCOUNTER — Other Ambulatory Visit: Payer: Self-pay | Admitting: Internal Medicine

## 2016-03-03 DIAGNOSIS — Z1239 Encounter for other screening for malignant neoplasm of breast: Secondary | ICD-10-CM

## 2016-03-03 DIAGNOSIS — Z1231 Encounter for screening mammogram for malignant neoplasm of breast: Secondary | ICD-10-CM | POA: Diagnosis present

## 2016-04-18 ENCOUNTER — Other Ambulatory Visit (INDEPENDENT_AMBULATORY_CARE_PROVIDER_SITE_OTHER): Payer: 59

## 2016-04-18 DIAGNOSIS — I10 Essential (primary) hypertension: Secondary | ICD-10-CM

## 2016-04-18 DIAGNOSIS — Z1159 Encounter for screening for other viral diseases: Secondary | ICD-10-CM

## 2016-04-18 DIAGNOSIS — Z2839 Other underimmunization status: Secondary | ICD-10-CM

## 2016-04-18 DIAGNOSIS — Z23 Encounter for immunization: Secondary | ICD-10-CM

## 2016-04-18 DIAGNOSIS — Z9189 Other specified personal risk factors, not elsewhere classified: Secondary | ICD-10-CM

## 2016-04-18 NOTE — Addendum Note (Signed)
Addended by: WIGGINS, Edye Hainline N on: 04/18/2016 08:38 AM   Modules accepted: Orders  

## 2016-04-18 NOTE — Addendum Note (Signed)
Addended by: WIGGINS, Serenidy Waltz N on: 04/18/2016 08:38 AM   Modules accepted: Orders  

## 2016-04-18 NOTE — Addendum Note (Signed)
Addended by: WIGGINS, Elijah Phommachanh N on: 04/18/2016 08:38 AM   Modules accepted: Orders  

## 2016-04-18 NOTE — Addendum Note (Signed)
Addended by: Penne LashWIGGINS, Leoncio Hansen N on: 04/18/2016 08:38 AM   Modules accepted: Orders

## 2016-04-18 NOTE — Addendum Note (Signed)
Addended by: WIGGINS, Tyshia Fenter N on: 04/18/2016 08:38 AM   Modules accepted: Orders  

## 2016-04-19 LAB — HEPATIC FUNCTION PANEL
ALBUMIN: 4.4 g/dL (ref 3.5–4.8)
ALT: 13 IU/L (ref 0–32)
AST: 13 IU/L (ref 0–40)
Alkaline Phosphatase: 70 IU/L (ref 39–117)
BILIRUBIN TOTAL: 0.3 mg/dL (ref 0.0–1.2)
BILIRUBIN, DIRECT: 0.1 mg/dL (ref 0.00–0.40)
Total Protein: 6.8 g/dL (ref 6.0–8.5)

## 2016-04-19 LAB — LIPID PANEL
CHOL/HDL RATIO: 3.8 ratio (ref 0.0–4.4)
Cholesterol, Total: 184 mg/dL (ref 100–199)
HDL: 49 mg/dL (ref >39)
LDL Calculated: 112 mg/dL — ABNORMAL HIGH (ref 0–99)
Triglycerides: 115 mg/dL (ref 0–149)
VLDL Cholesterol Cal: 23 mg/dL (ref 5–40)

## 2016-04-19 LAB — BASIC METABOLIC PANEL
BUN / CREAT RATIO: 16 (ref 12–28)
BUN: 12 mg/dL (ref 8–27)
CHLORIDE: 102 mmol/L (ref 96–106)
CO2: 27 mmol/L (ref 18–29)
CREATININE: 0.73 mg/dL (ref 0.57–1.00)
Calcium: 9.8 mg/dL (ref 8.7–10.3)
GFR calc Af Amer: 96 mL/min/{1.73_m2} (ref >59)
GFR calc non Af Amer: 84 mL/min/{1.73_m2} (ref >59)
GLUCOSE: 99 mg/dL (ref 65–99)
Potassium: 4 mmol/L (ref 3.5–5.2)
SODIUM: 143 mmol/L (ref 134–144)

## 2016-04-19 LAB — VARICELLA ZOSTER ANTIBODY, IGG: VARICELLA: 1690 {index} (ref >165)

## 2016-04-19 LAB — HEPATITIS C ANTIBODY

## 2016-04-20 ENCOUNTER — Encounter: Payer: Self-pay | Admitting: Internal Medicine

## 2016-04-21 ENCOUNTER — Encounter: Payer: Self-pay | Admitting: Internal Medicine

## 2016-04-21 ENCOUNTER — Ambulatory Visit (INDEPENDENT_AMBULATORY_CARE_PROVIDER_SITE_OTHER): Payer: 59 | Admitting: Internal Medicine

## 2016-04-21 VITALS — BP 108/68 | HR 73 | Temp 97.7°F | Ht 67.5 in | Wt 225.8 lb

## 2016-04-21 DIAGNOSIS — E559 Vitamin D deficiency, unspecified: Secondary | ICD-10-CM | POA: Diagnosis not present

## 2016-04-21 DIAGNOSIS — K219 Gastro-esophageal reflux disease without esophagitis: Secondary | ICD-10-CM

## 2016-04-21 DIAGNOSIS — Z1211 Encounter for screening for malignant neoplasm of colon: Secondary | ICD-10-CM

## 2016-04-21 DIAGNOSIS — L659 Nonscarring hair loss, unspecified: Secondary | ICD-10-CM | POA: Diagnosis not present

## 2016-04-21 DIAGNOSIS — Z0000 Encounter for general adult medical examination without abnormal findings: Secondary | ICD-10-CM

## 2016-04-21 DIAGNOSIS — I10 Essential (primary) hypertension: Secondary | ICD-10-CM

## 2016-04-21 DIAGNOSIS — F32A Depression, unspecified: Secondary | ICD-10-CM

## 2016-04-21 DIAGNOSIS — F329 Major depressive disorder, single episode, unspecified: Secondary | ICD-10-CM

## 2016-04-21 MED ORDER — LANSOPRAZOLE 30 MG PO TBDP: 30.0000 mg | ORAL_TABLET | Freq: Every day | ORAL | 2 refills | Status: DC

## 2016-04-21 NOTE — Progress Notes (Signed)
Pre visit review using our clinic review tool, if applicable. No additional management support is needed unless otherwise documented below in the visit note. 

## 2016-04-21 NOTE — Assessment & Plan Note (Addendum)
Physical today 04/21/16.  PAP 08/31/10 - negative with negative HPV.  Mammogram 03/03/16 - Birads I.  Discussed the need for colonoscopy.  She is in agreement.  Refer to GI.

## 2016-04-21 NOTE — Progress Notes (Signed)
[name], female   DOB: 1945-10-12, 70 y.o.   MRN: [id number]   Subjective:    [name], female    DOB: 1945-10-12, 70 y.o.   MRN: [id number]  HPI  Patient here for her physical exam.  Still with increased stress with her son's issues.   Discussed with her today.  On zoloft.  She did retire.  Some decreased stress with this.  Overall does not feel needs anything more at this time.  We discussed diet and exercise.  She tries to stay active.  No chest pain.  No sob.  She is having some increased acid reflux.  Taking zantac solution.  Not able to swallow pills well.  No abdominal pain.  Overdue colonoscopy.  She is agreeable for evaluation.  Bowels stable.     Past Medical History:  Diagnosis Date  . Allergy   . Depression   . History of chicken pox   . Hx: UTI (urinary tract infection)   . Hypertension    Past Surgical History:  Procedure Laterality Date  . TONSILLECTOMY     as a child   Family History  Problem Relation Age of Onset  . Hypertension Mother   . Cancer Sister     colon cancer  . Hypertension Sister   . Diabetes Sister   . Hypertension Brother   . Cancer Maternal Aunt     breast cancer  . Hypertension Maternal Aunt   . Breast cancer Maternal Aunt 60  . Hypertension Maternal Uncle   . Stroke Maternal Grandmother   . Hypertension Maternal Grandmother    Social History   Social History  . Marital status: Divorced    Spouse name: N/A  . Number of children: N/A  . Years of education: N/A   Social History Main Topics  . Smoking status: Never Smoker  . Smokeless tobacco: Never Used  . Alcohol use No  . Drug use: No  . Sexual activity: Not Asked   Other Topics Concern  . None   Social History Narrative  . None    Outpatient Encounter Prescriptions as of 04/21/2016  Medication Sig  . lisinopril-hydrochlorothiazide (PRINZIDE,ZESTORETIC) 10-12.5 MG tablet Take 1 tablet by mouth  daily  . sertraline (ZOLOFT) 25 MG  tablet Take 1 tablet by mouth  daily  . [DISCONTINUED] conjugated estrogens (PREMARIN) vaginal cream Apply pea sized amount of cream to vaginal opening (urethra) M-W-F prior to bed  . [DISCONTINUED] ranitidine (ZANTAC) 15 MG/ML syrup TAKE 10 MLS BY MOUTH 2 TIMES DAILY.  Marland Kitchen. lansoprazole (PREVACID SOLUTAB) 30 MG disintegrating tablet Take 1 tablet (30 mg total) by mouth daily.   No facility-administered encounter medications on file as of 04/21/2016.     Review of Systems  Constitutional: Negative for appetite change and unexpected weight change.  HENT: Negative for congestion and sinus pressure.   Eyes: Negative for pain and visual disturbance.  Respiratory: Negative for cough, chest tightness and shortness of breath.   Cardiovascular: Negative for chest pain, palpitations and leg swelling.  Gastrointestinal: Negative for abdominal pain, diarrhea, nausea and vomiting.  Genitourinary: Negative for difficulty urinating and dysuria.  Musculoskeletal: Negative for back pain and joint swelling.  Skin: Negative for color change and rash.  Neurological: Negative for dizziness, light-headedness and headaches.  Hematological: Negative for adenopathy. Does not bruise/bleed easily.  Psychiatric/Behavioral: Negative for agitation and dysphoric mood.       Objective:     Blood pressure rechecked by me:  134/84  Physical Exam  Constitutional: She is oriented to person, place, and time. She appears well-developed and well-nourished. No distress.  HENT:  Nose: Nose normal.  Mouth/Throat: Oropharynx is clear and moist.  Eyes: Right eye exhibits no discharge. Left eye exhibits no discharge. No scleral icterus.  Neck: Neck supple. No thyromegaly present.  Cardiovascular: Normal rate and regular rhythm.   Pulmonary/Chest: Breath sounds normal. No accessory muscle usage. No tachypnea. No respiratory distress. She has no decreased breath sounds. She has no wheezes. She has no rhonchi. Right breast  exhibits no inverted nipple, no mass, no nipple discharge and no tenderness (no axillary adenopathy). Left breast exhibits no inverted nipple, no mass, no nipple discharge and no tenderness (no axilarry adenopathy).  Abdominal: Soft. Bowel sounds are normal. There is no tenderness.  Musculoskeletal: She exhibits no edema or tenderness.  Lymphadenopathy:    She has no cervical adenopathy.  Neurological: She is alert and oriented to person, place, and time.  Skin: Skin is warm. No rash noted. No erythema.  Psychiatric: She has a normal mood and affect. Her behavior is normal.    BP 108/68   Pulse 73   Temp 97.7 F (36.5 C) (Oral)   Ht 5' 7.5" (1.715 m)   Wt 225 lb 12.8 oz (102.4 kg)   SpO2 96%   BMI 34.84 kg/m  Wt Readings from Last 3 Encounters:  04/21/16 225 lb 12.8 oz (102.4 kg)  01/11/16 220 lb (99.8 kg)  10/21/15 217 lb 12 oz (98.8 kg)     Lab Results  Component Value Date   WBC 5.7 04/21/2016   HGB 12.4 10/21/2015   HCT 35.5 04/21/2016   PLT 262 04/21/2016   GLUCOSE 99 04/18/2016   CHOL 184 04/18/2016   TRIG 115 04/18/2016   HDL 49 04/18/2016   LDLCALC 112 (H) 04/18/2016   ALT 13 04/18/2016   AST 13 04/18/2016   NA 143 04/18/2016   K 4.0 04/18/2016   CL 102 04/18/2016   CREATININE 0.73 04/18/2016   BUN 12 04/18/2016   CO2 27 04/18/2016   TSH WILL FOLLOW 04/21/2016    Mm Screening Breast Tomo Bilateral  Result Date: 03/03/2016 CLINICAL DATA:  Screening. EXAM: 2D DIGITAL SCREENING BILATERAL MAMMOGRAM WITH CAD AND ADJUNCT TOMO COMPARISON:  Previous exam(s). ACR Breast Density Category c: The breast tissue is heterogeneously dense, which may obscure small masses. FINDINGS: There are no findings suspicious for malignancy. Images were processed with CAD. IMPRESSION: No mammographic evidence of malignancy. A result letter of this screening mammogram will be mailed directly to the patient. RECOMMENDATION: Screening mammogram in one year. (Code:SM-B-01Y) BI-RADS CATEGORY   1: Negative. Electronically Signed   By: Beckie SaltsSteven  Reid M.D.   On: 03/03/2016 09:48       Assessment & Plan:   Problem List Items Addressed This Visit    Depression    Discussed with her today.  On zoloft.  Has retired.  Still with increased stress with her family issues.  Declines need for any further intervention.  Follow.       Essential hypertension, benign - Primary    Blood pressure under good control.  Continue same medication regimen.  Follow pressures.  Follow metabolic panel.        Relevant Orders   CBC w/Diff (Completed)   GERD (gastroesophageal reflux disease)    On zantac solution now.  Unable to swallow pills.  Having increased acid reflux.  Will start her on prevacid solutabs.  Refer  to GI for evaluation and question of need for EGD.       Relevant Medications   lansoprazole (PREVACID SOLUTAB) 30 MG disintegrating tablet   Hair loss    Will check thyroid and cbc.  Discussed referral to dermatology.        Relevant Orders   TSH (Completed)   Health care maintenance    Physical today 04/21/16.  PAP 08/31/10 - negative with negative HPV.  Mammogram 03/03/16 - Bir[facility] I.  Discussed the need for colonoscopy.  She is in agreement.  Refer to GI.       Relevant Orders   TSH (Completed)   CBC w/Diff (Completed)   Vitamin D deficiency    Follow vitamin D level.           Dale Riverside, MD

## 2016-04-23 ENCOUNTER — Encounter: Payer: Self-pay | Admitting: Internal Medicine

## 2016-04-23 NOTE — Assessment & Plan Note (Signed)
Will check thyroid and cbc.  Discussed referral to dermatology.

## 2016-04-23 NOTE — Assessment & Plan Note (Signed)
Blood pressure under good control.  Continue same medication regimen.  Follow pressures.  Follow metabolic panel.   

## 2016-04-23 NOTE — Assessment & Plan Note (Signed)
Follow vitamin D level.  

## 2016-04-23 NOTE — Assessment & Plan Note (Signed)
Discussed with her today.  On zoloft.  Has retired.  Still with increased stress with her family issues.  Declines need for any further intervention.  Follow.

## 2016-04-23 NOTE — Assessment & Plan Note (Signed)
On zantac solution now.  Unable to swallow pills.  Having increased acid reflux.  Will start her on prevacid solutabs.  Refer to GI for evaluation and question of need for EGD.

## 2016-04-24 ENCOUNTER — Encounter: Payer: Self-pay | Admitting: Internal Medicine

## 2016-04-25 ENCOUNTER — Other Ambulatory Visit: Payer: Self-pay | Admitting: Internal Medicine

## 2016-04-25 ENCOUNTER — Telehealth: Payer: Self-pay

## 2016-04-25 LAB — CBC WITH DIFFERENTIAL/PLATELET
BASOS: 0 %
Basophils Absolute: 0 10*3/uL (ref 0.0–0.2)
EOS (ABSOLUTE): 0.1 10*3/uL (ref 0.0–0.4)
EOS: 2 %
HEMATOCRIT: 35.5 % (ref 34.0–46.6)
Hemoglobin: 12.1 g/dL (ref 11.1–15.9)
IMMATURE GRANS (ABS): 0 10*3/uL (ref 0.0–0.1)
IMMATURE GRANULOCYTES: 0 %
LYMPHS: 38 %
Lymphocytes Absolute: 2.2 10*3/uL (ref 0.7–3.1)
MCH: 30.4 pg (ref 26.6–33.0)
MCHC: 34.1 g/dL (ref 31.5–35.7)
MCV: 89 fL (ref 79–97)
MONOCYTES: 9 %
MONOS ABS: 0.5 10*3/uL (ref 0.1–0.9)
NEUTROS PCT: 51 %
Neutrophils Absolute: 2.9 10*3/uL (ref 1.4–7.0)
Platelets: 262 10*3/uL (ref 150–379)
RBC: 3.98 x10E6/uL (ref 3.77–5.28)
RDW: 13.8 % (ref 12.3–15.4)
WBC: 5.7 10*3/uL (ref 3.4–10.8)

## 2016-04-25 LAB — TSH: TSH: 2.32 u[IU]/mL (ref 0.450–4.500)

## 2016-04-25 LAB — SPECIMEN STATUS REPORT

## 2016-04-25 NOTE — Progress Notes (Signed)
Opened in error

## 2016-04-25 NOTE — Telephone Encounter (Signed)
She is unable to swallow pills/capsules or tablets.  Which one of these will they cover in suspension?

## 2016-04-25 NOTE — Telephone Encounter (Signed)
PA for Prevacid completed on Cover my meds.    Denied by insurance, denied for medical necessity , needs to have tried two meds, Dexilant, esomeprazole, lansoprazole, omeprazole She has only tried Zantac.   Please advise.

## 2016-04-27 NOTE — Telephone Encounter (Signed)
I called the pharmacy ([facility]) the solu tab is defiently not covered.  When it comes to suspensions/oral choices the Dexilant is not available at all in that form.  Prilosec packets are not covered.  Nexium packets are covered but have a high co pay of $250  And there suggestions was to order generic capsules of Prevacid and then have the patient open the capsule and take with Applesauce, pudding or yogurt as the co pay is $25.

## 2016-04-27 NOTE — Telephone Encounter (Incomplete)
Spoke with the pharmacy ([facility]) and she can use,

## 2016-04-28 NOTE — Telephone Encounter (Signed)
Left a message for patient to return my call. thanks

## 2016-04-28 NOTE — Telephone Encounter (Signed)
Unread mychart message mailed to patient 

## 2016-04-28 NOTE — Telephone Encounter (Signed)
Please call pt and notify her of the recommendations from the pharmacy.  See if she is willing to try this.  If so, ok to send in rx for the prevacid with instructions as outlined.  Can send in 30 day supply to her local pharmacy and see if this helps.   Her other option is to try to swallow a capsule or tablet.

## 2016-05-03 NOTE — Telephone Encounter (Signed)
Spoke with the patient, she is going to try the OTC capsules mixed with applesauce at home first, if that works she will call back and have us call it into the pharmacy.  Thanks

## 2016-05-24 ENCOUNTER — Telehealth: Payer: Self-pay

## 2016-05-24 ENCOUNTER — Other Ambulatory Visit: Payer: Self-pay

## 2016-05-24 NOTE — Telephone Encounter (Signed)
Gastroenterology Pre-Procedure Review  Request Date:  Requesting Physician: Dr.   PATIENT REVIEW QUESTIONS: The patient responded to the following health history questions as indicated:    1. Are you having any GI issues? yes (GERD) 2. Do you have a personal history of Polyps? no 3. Do you have a family history of Colon Cancer or Polyps? yes (sister) 4. Diabetes Mellitus? no 5. Joint replacements in the past 12 months?no 6. Major health problems in the past 3 months?no 7. Any artificial heart valves, MVP, or defibrillator?no    MEDICATIONS & ALLERGIES:    Patient reports the following regarding taking any anticoagulation/antiplatelet therapy:   Plavix, Coumadin, Eliquis, Xarelto, Lovenox, Pradaxa, Brilinta, or Effient? no Aspirin? no  Patient confirms/reports the following medications:  Current Outpatient Prescriptions  Medication Sig Dispense Refill  . lansoprazole (PREVACID SOLUTAB) 30 MG disintegrating tablet Take 1 tablet (30 mg total) by mouth daily. 30 tablet 2  . lisinopril-hydrochlorothiazide (PRINZIDE,ZESTORETIC) 10-12.5 MG tablet Take 1 tablet by mouth  daily 90 tablet 0  . sertraline (ZOLOFT) 25 MG tablet Take 1 tablet by mouth  daily 90 tablet 1   No current facility-administered medications for this visit.     Patient confirms/reports the following allergies:  Allergies  Allergen Reactions  . Codeine Hives  . Iodinated Diagnostic Agents Swelling  . Nitrofurantoin Monohyd Macro Nausea Only    No orders of the defined types were placed in this encounter.   AUTHORIZATION INFORMATION Primary Insurance: 1D#: Group #:  Secondary Insurance: 1D#: Group #:  SCHEDULE INFORMATION: Date: 06/16/16 Time: Location:  [facility]

## 2016-05-31 ENCOUNTER — Other Ambulatory Visit: Payer: Self-pay | Admitting: Internal Medicine

## 2016-05-31 MED ORDER — LISINOPRIL-HYDROCHLOROTHIAZIDE 10-12.5 MG PO TABS: 1.0000 | ORAL_TABLET | Freq: Every day | ORAL | 3 refills | Status: DC

## 2016-06-05 ENCOUNTER — Telehealth: Payer: Self-pay | Admitting: Gastroenterology

## 2016-06-05 NOTE — Telephone Encounter (Signed)
Pt returned call and clarified procedure. She does want to do EGD and colonoscopy.

## 2016-06-05 NOTE — Telephone Encounter (Signed)
Patient left a voice message that she would like for you to call her. °

## 2016-06-13 NOTE — Discharge Instructions (Signed)

## 2016-06-15 ENCOUNTER — Other Ambulatory Visit: Payer: Self-pay

## 2016-06-15 DIAGNOSIS — L659 Nonscarring hair loss, unspecified: Secondary | ICD-10-CM | POA: Diagnosis not present

## 2016-06-16 ENCOUNTER — Ambulatory Visit: Admission: RE | Admit: 2016-06-16 | Payer: Medicare HMO | Source: Ambulatory Visit | Admitting: Gastroenterology

## 2016-06-16 ENCOUNTER — Encounter: Admission: RE | Payer: Self-pay | Source: Ambulatory Visit

## 2016-06-16 SURGERY — COLONOSCOPY WITH PROPOFOL
Anesthesia: Choice

## 2016-06-20 ENCOUNTER — Ambulatory Visit
Admission: RE | Admit: 2016-06-20 | Discharge: 2016-06-20 | Disposition: A | Discharge status: Home / Self Care | Payer: Medicare HMO | Source: Ambulatory Visit | Attending: Gastroenterology | Admitting: Gastroenterology

## 2016-06-20 ENCOUNTER — Ambulatory Visit: Payer: Medicare HMO | Admitting: Anesthesiology

## 2016-06-20 ENCOUNTER — Encounter
Admission: RE | Disposition: A | Discharge status: Home / Self Care | Payer: Self-pay | Source: Ambulatory Visit | Attending: Gastroenterology

## 2016-06-20 DIAGNOSIS — K640 First degree hemorrhoids: Secondary | ICD-10-CM | POA: Diagnosis not present

## 2016-06-20 DIAGNOSIS — K228 Other specified diseases of esophagus: Secondary | ICD-10-CM | POA: Insufficient documentation

## 2016-06-20 DIAGNOSIS — K2100 Gastro-esophageal reflux disease with esophagitis, without bleeding: Secondary | ICD-10-CM

## 2016-06-20 DIAGNOSIS — R12 Heartburn: Secondary | ICD-10-CM

## 2016-06-20 DIAGNOSIS — Z1211 Encounter for screening for malignant neoplasm of colon: Secondary | ICD-10-CM

## 2016-06-20 DIAGNOSIS — I10 Essential (primary) hypertension: Secondary | ICD-10-CM | POA: Diagnosis not present

## 2016-06-20 DIAGNOSIS — F329 Major depressive disorder, single episode, unspecified: Secondary | ICD-10-CM | POA: Insufficient documentation

## 2016-06-20 DIAGNOSIS — K648 Other hemorrhoids: Secondary | ICD-10-CM | POA: Diagnosis not present

## 2016-06-20 DIAGNOSIS — K219 Gastro-esophageal reflux disease without esophagitis: Secondary | ICD-10-CM | POA: Diagnosis present

## 2016-06-20 DIAGNOSIS — Z6832 Body mass index (BMI) 32.0-32.9, adult: Secondary | ICD-10-CM | POA: Diagnosis not present

## 2016-06-20 DIAGNOSIS — K2210 Ulcer of esophagus without bleeding: Secondary | ICD-10-CM | POA: Diagnosis not present

## 2016-06-20 DIAGNOSIS — Z79899 Other long term (current) drug therapy: Secondary | ICD-10-CM | POA: Diagnosis not present

## 2016-06-20 HISTORY — PX: COLONOSCOPY WITH PROPOFOL: SHX5780

## 2016-06-20 HISTORY — PX: ESOPHAGOGASTRODUODENOSCOPY (EGD) WITH PROPOFOL: SHX5813

## 2016-06-20 SURGERY — COLONOSCOPY WITH PROPOFOL
Anesthesia: General

## 2016-06-20 MED ORDER — LIDOCAINE HCL (PF) 1 % IJ SOLN
2.0000 mL | Freq: Once | INTRAMUSCULAR | Status: AC
  Administered 2016-06-20: 0.3 mL via INTRADERMAL

## 2016-06-20 MED ORDER — LIDOCAINE HCL (PF) 2 % IJ SOLN
INTRAMUSCULAR | Status: AC
Start: 2016-06-20 — End: 2016-06-20
  Filled 2016-06-20: qty 2

## 2016-06-20 MED ORDER — LIDOCAINE HCL (PF) 1 % IJ SOLN
INTRAMUSCULAR | Status: AC
  Administered 2016-06-20: 0.3 mL via INTRADERMAL
  Filled 2016-06-20: qty 2

## 2016-06-20 MED ORDER — ESOMEPRAZOLE MAGNESIUM 40 MG PO CPDR: 40.0000 mg | DELAYED_RELEASE_CAPSULE | Freq: Every day | ORAL | 11 refills | Status: DC

## 2016-06-20 MED ORDER — SODIUM CHLORIDE 0.9 % IV SOLN
INTRAVENOUS | Status: DC
  Administered 2016-06-20: 1000 mL via INTRAVENOUS

## 2016-06-20 MED ORDER — PROPOFOL 500 MG/50ML IV EMUL
INTRAVENOUS | Status: AC
  Filled 2016-06-20: qty 50

## 2016-06-20 MED ORDER — PROPOFOL 500 MG/50ML IV EMUL
INTRAVENOUS | Status: DC | PRN
  Administered 2016-06-20: 120 ug/kg/min via INTRAVENOUS

## 2016-06-20 MED ORDER — PROPOFOL 10 MG/ML IV BOLUS
INTRAVENOUS | Status: DC | PRN
  Administered 2016-06-20 (×2): 30 mg via INTRAVENOUS

## 2016-06-20 MED ORDER — LIDOCAINE HCL (CARDIAC) 20 MG/ML IV SOLN
INTRAVENOUS | Status: DC | PRN
  Administered 2016-06-20: 2 mL via INTRAVENOUS

## 2016-06-20 NOTE — H&P (Signed)
  Susan Miller Emoree Sasaki, MD [facility] [address] [place], [place] [address] Phone: [phone] Fax : [phone]  Primary Care Physician:  Dale DurhamSCOTT, CHARLENE, MD Primary Gastroenterologist:  Dr. Servando SnareWohl  Pre-Procedure History & Physical: HPI:  Susan SensingLinda H Miller is a 71 y.o. female is here for an endoscopy and colonoscopy.   Past Medical History:  Diagnosis Date  . Allergy   . Depression   . History of chicken pox   . Hx: UTI (urinary tract infection)   . Hypertension     Past Surgical History:  Procedure Laterality Date  . TONSILLECTOMY     as a child    Prior to Admission medications   Medication Sig Start Date End Date Taking? Authorizing Provider  lansoprazole (PREVACID SOLUTAB) 30 MG disintegrating tablet Take 1 tablet (30 mg total) by mouth daily. 04/21/16   Dale Durhamharlene Scott, MD  lisinopril-hydrochlorothiazide (PRINZIDE,ZESTORETIC) 10-12.5 MG tablet Take 1 tablet by mouth daily. 05/31/16   Dale Durhamharlene Scott, MD  sertraline (ZOLOFT) 25 MG tablet Take 1 tablet by mouth  daily 10/22/15   Dale Durhamharlene Scott, MD    Allergies as of 06/15/2016 - Review Complete 04/23/2016  Allergen Reaction Noted  . Codeine Hives 12/13/2012  . Iodinated diagnostic agents Swelling 03/03/2015  . Nitrofurantoin monohyd macro Nausea Only 03/03/2015    Family History  Problem Relation Age of Onset  . Hypertension Mother   . Cancer Sister     colon cancer  . Hypertension Sister   . Diabetes Sister   . Hypertension Brother   . Cancer Maternal Aunt     breast cancer  . Hypertension Maternal Aunt   . Breast cancer Maternal Aunt 60  . Hypertension Maternal Uncle   . Stroke Maternal Grandmother   . Hypertension Maternal Grandmother     Social History   Social History  . Marital status: Divorced    Spouse name: N/A  . Number of children: N/A  . Years of education: N/A   Occupational History  . Not on file.   Social History Main Topics  . Smoking status: Never Smoker  . Smokeless tobacco: Never Used   . Alcohol use No  . Drug use: No  . Sexual activity: Not on file   Other Topics Concern  . Not on file   Social History Narrative  . No narrative on file    Review of Systems: See HPI, otherwise negative ROS  Physical Exam: There were no vitals taken for this visit. General:   Alert,  pleasant and cooperative in NAD Head:  Normocephalic and atraumatic. Neck:  Supple; no masses or thyromegaly. Lungs:  Clear throughout to auscultation.    Heart:  Regular rate and rhythm. Abdomen:  Soft, nontender and nondistended. Normal bowel sounds, without guarding, and without rebound.   Neurologic:  Alert and  oriented x4;  grossly normal neurologically.  Impression/Plan: Susan Miller is here for an endoscopy and colonoscopy to be performed for screening and GERD  Risks, benefits, limitations, and alternatives regarding  endoscopy and colonoscopy have been reviewed with the patient.  Questions have been answered.  All parties agreeable.   Susan Miller Lysbeth Dicola, MD  06/20/2016, 9:41 AM

## 2016-06-20 NOTE — Op Note (Signed)
[facility] Gastroenterology Patient Name: Susan Miller Procedure Date: 06/20/2016 9:53 AM MRN: [id number] Account #: [id number] Date of Birth: 1945-10-02 Admit Type: Outpatient Age: 71 Room: [facility] ENDO ROOM 4 Gender: Female Note Status: Finalized Procedure:            Upper GI endoscopy Indications:          Heartburn Providers:            Susan Minium MD, MD Referring MD:         Dale [place], MD (Referring MD) Medicines:            Propofol per Anesthesia Complications:        No immediate complications. Procedure:            Pre-Anesthesia Assessment:                       - Prior to the procedure, a History and Physical was                        performed, and patient medications and allergies were                        reviewed. The patient's tolerance of previous                        anesthesia was also reviewed. The risks and benefits of                        the procedure and the sedation options and risks were                        discussed with the patient. All questions were                        answered, and informed consent was obtained. Prior                        Anticoagulants: The patient has taken no previous                        anticoagulant or antiplatelet agents. ASA Grade                        Assessment: II - A patient with mild systemic disease.                        After reviewing the risks and benefits, the patient was                        deemed in satisfactory condition to undergo the                        procedure.                       After obtaining informed consent, the endoscope was                        passed under direct vision. Throughout the procedure,  the patient's blood pressure, pulse, and oxygen                        saturations were monitored continuously. The Endoscope                        was introduced through the mouth, and advanced to the   second part of duodenum. The upper GI endoscopy was                        accomplished without difficulty. The patient tolerated                        the procedure well. Findings:      LA Grade C (one or more mucosal breaks continuous between tops of 2 or       more mucosal folds, less than 75% circumference) esophagitis with       bleeding was found at the gastroesophageal junction.      The stomach was normal.      The examined duodenum was normal. Impression:           - LA Grade C reflux esophagitis.                       - Normal stomach.                       - Normal examined duodenum.                       - No specimens collected. Recommendation:       - Discharge patient to home.                       - Resume previous diet.                       - Use a proton pump inhibitor PO daily. Procedure Code(s):    --- Professional ---                       [phone], Esophagogastroduodenoscopy, flexible, transoral;                        diagnostic, including collection of specimen(s) by                        brushing or washing, when performed (separate procedure) Diagnosis Code(s):    --- Professional ---                       R12, Heartburn                       K21.0, Gastro-esophageal reflux disease with esophagitis CPT copyright 2016 American Medical Association. All rights reserved. The codes documented in this report are preliminary and upon coder review may  be revised to meet current compliance requirements. Susan Miniumarren Barrett Holthaus MD, MD 06/20/2016 10:20:07 AM This report has been signed electronically. Number of Addenda: 0 Note Initiated On: 06/20/2016 9:53 AM      [facility]

## 2016-06-20 NOTE — Anesthesia Preprocedure Evaluation (Signed)
Anesthesia Evaluation  Patient identified by MRN, date of birth, ID band Patient awake    Reviewed: Allergy & Precautions, NPO status , Patient's Chart, lab work & pertinent test results  Airway Mallampati: II       Dental  (+) Teeth Intact   Pulmonary neg pulmonary ROS,     + decreased breath sounds      Cardiovascular Exercise Tolerance: Good hypertension, Pt. on medications  Rhythm:Regular     Neuro/Psych  Headaches, Depression    GI/Hepatic Neg liver ROS, GERD  Medicated,  Endo/Other  negative endocrine ROSMorbid obesity  Renal/GU negative Renal ROS     Musculoskeletal   Abdominal (+) + obese,   Peds negative pediatric ROS (+)  Hematology negative hematology ROS (+)   Anesthesia Other Findings   Reproductive/Obstetrics                             Anesthesia Physical Anesthesia Plan  ASA: III  Anesthesia Plan: General   Post-op Pain Management:    Induction:   Airway Management Planned: Natural Airway and Nasal Cannula  Additional Equipment:   Intra-op Plan:   Post-operative Plan:   Informed Consent: I have reviewed the patients History and Physical, chart, labs and discussed the procedure including the risks, benefits and alternatives for the proposed anesthesia with the patient or authorized representative who has indicated his/her understanding and acceptance.     Plan Discussed with: Surgeon  Anesthesia Plan Comments:         Anesthesia Quick Evaluation

## 2016-06-20 NOTE — Anesthesia Postprocedure Evaluation (Signed)
Anesthesia Post Note  Patient: Susan Miller  Procedure(s) Performed: Procedure(s) (LRB): COLONOSCOPY WITH PROPOFOL (N/A) ESOPHAGOGASTRODUODENOSCOPY (EGD) WITH PROPOFOL (N/A)  Patient location during evaluation: PACU Anesthesia Type: General Level of consciousness: awake Pain management: satisfactory to patient Vital Signs Assessment: post-procedure vital signs reviewed and stable Respiratory status: nonlabored ventilation Cardiovascular status: stable Anesthetic complications: no     Last Vitals:  Vitals:   06/20/16 1120 06/20/16 1121  BP:  133/77  Pulse: 73 73  Resp:  16  Temp:      Last Pain:  Vitals:   06/20/16 1050  TempSrc: Tympanic  PainSc:                  VAN STAVEREN,Sirenia Whitis

## 2016-06-20 NOTE — Anesthesia Post-op Follow-up Note (Signed)
Anesthesia QCDR form completed.        

## 2016-06-20 NOTE — Transfer of Care (Signed)
Immediate Anesthesia Transfer of Care Note  Patient: Susan Miller  Procedure(s) Performed: Procedure(s): COLONOSCOPY WITH PROPOFOL (N/A) ESOPHAGOGASTRODUODENOSCOPY (EGD) WITH PROPOFOL (N/A)  Patient Location: PACU  Anesthesia Type:General  Level of Consciousness: awake  Airway & Oxygen Therapy: Patient Spontanous Breathing and Patient connected to nasal cannula oxygen  Post-op Assessment: Report given to RN and Post -op Vital signs reviewed and stable  Post vital signs: Reviewed  Last Vitals:  Vitals:   06/20/16 0938  BP: (!) 159/78  Pulse: (!) 103  Resp: 17  Temp: (!) 35.9 C    Last Pain:  Vitals:   06/20/16 0938  TempSrc: Tympanic  PainSc: 2          Complications: No apparent anesthesia complications

## 2016-06-20 NOTE — Op Note (Signed)
[facility] Gastroenterology Patient Name: Susan Miller Procedure Date: 06/20/2016 9:52 AM MRN: [id number] Account #: [id number] Date of Birth: 1946/01/31 Admit Type: Outpatient Age: 71 Room: [facility] ENDO ROOM 4 Gender: Female Note Status: Finalized Procedure:            Colonoscopy Indications:          Screening for colorectal malignant neoplasm Providers:            Midge Miniumarren Meriam Chojnowski MD, MD Referring MD:         Dale Durhamharlene Scott, MD (Referring MD) Medicines:            Propofol per Anesthesia Complications:        No immediate complications. Procedure:            Pre-Anesthesia Assessment:                       - Prior to the procedure, a History and Physical was                        performed, and patient medications and allergies were                        reviewed. The patient's tolerance of previous                        anesthesia was also reviewed. The risks and benefits of                        the procedure and the sedation options and risks were                        discussed with the patient. All questions were                        answered, and informed consent was obtained. Prior                        Anticoagulants: The patient has taken no previous                        anticoagulant or antiplatelet agents. ASA Grade                        Assessment: II - A patient with mild systemic disease.                        After reviewing the risks and benefits, the patient was                        deemed in satisfactory condition to undergo the                        procedure.                       After obtaining informed consent, the colonoscope was                        passed under direct vision. Throughout the procedure,  the patient's blood pressure, pulse, and oxygen                        saturations were monitored continuously. The                        Colonoscope was introduced through the anus and          advanced to the the cecum, identified by appendiceal                        orifice and ileocecal valve. The colonoscopy was                        performed without difficulty. The patient tolerated the                        procedure well. The quality of the bowel preparation                        was good. Findings:      The perianal and digital rectal examinations were normal.      Non-bleeding internal hemorrhoids were found during retroflexion. The       hemorrhoids were Grade I (internal hemorrhoids that do not prolapse). Impression:           - Non-bleeding internal hemorrhoids.                       - No specimens collected. Recommendation:       - Discharge patient to home.                       - Resume previous diet.                       - Continue present medications.                       - Repeat colonoscopy in 10 years for screening unless                        any change in family history or lower GI problems. Procedure Code(s):    --- Professional ---                       [phone], Colonoscopy, flexible; diagnostic, including                        collection of specimen(s) by brushing or washing, when                        performed (separate procedure) Diagnosis Code(s):    --- Professional ---                       Z12.11, Encounter for screening for malignant neoplasm                        of colon CPT copyright 2016 American Medical Association. All rights reserved. The codes documented in this report are preliminary and upon coder review may  be revised to meet current compliance requirements. Midge Minium MD, MD 06/20/2016 10:40:21 AM  This report has been signed electronically. Number of Addenda: 0 Note Initiated On: 06/20/2016 9:52 AM Scope Withdrawal Time: 0 hours 7 minutes 22 seconds  Total Procedure Duration: 0 hours 15 minutes 10 seconds       [facility]

## 2016-06-21 ENCOUNTER — Encounter: Payer: Self-pay | Admitting: Gastroenterology

## 2016-06-29 ENCOUNTER — Encounter: Payer: Self-pay | Admitting: Gastroenterology

## 2016-07-12 ENCOUNTER — Telehealth: Payer: Self-pay | Admitting: Internal Medicine

## 2016-07-12 MED ORDER — LISINOPRIL-HYDROCHLOROTHIAZIDE 10-12.5 MG PO TABS: 1.0000 | ORAL_TABLET | Freq: Every day | ORAL | 0 refills | Status: DC

## 2016-07-12 MED ORDER — ESOMEPRAZOLE MAGNESIUM 40 MG PO CPDR: 40.0000 mg | DELAYED_RELEASE_CAPSULE | Freq: Every day | ORAL | 0 refills | Status: DC

## 2016-07-12 MED ORDER — SERTRALINE HCL 25 MG PO TABS: ORAL_TABLET | ORAL | 0 refills | Status: DC

## 2016-07-12 NOTE — Telephone Encounter (Signed)
Pt requesting 90 day supply on :   Medication: Zoloft 25mg   Directions: 1 po qd  Last given: 04/21/16 Number refills: 1 Last o/v: 04/21/16 Follow up: n/a Labs: 04/21/16  Medication: Lisinopril  Directions: 1 po qd  Last given: 05/31/16 #90 Number refills: 3 Last o/v: 04/21/16 Follow up: n/a  Labs: 04/21/16  Medication: Nexium 40mg   Directions: Last given: 06/20/16 #30 Number refills: 11 Last o/v: 04/21/16 Follow up: n/a Labs: 04/21/16

## 2016-07-12 NOTE — Telephone Encounter (Signed)
Pt called needing medications refilled of sertraline (ZOLOFT) 25 MG tablet, lisinopril-hydrochlorothiazide (PRINZIDE,ZESTORETIC) 10-12.5 MG tablet and esomeprazole (NEXIUM) 40 MG capsule.   Pharmacy is [facility] [phone]. Questions [phone]. Thank you!

## 2016-07-12 NOTE — Telephone Encounter (Signed)
Left message to return call to our office.  

## 2016-07-12 NOTE — Telephone Encounter (Signed)
I have sent in rx for 90 days - zoloft, lisinopril/hctz and nexium.  She needs a f/u appt.

## 2016-07-17 MED ORDER — ESOMEPRAZOLE MAGNESIUM 40 MG PO CPDR: 40.0000 mg | DELAYED_RELEASE_CAPSULE | Freq: Every day | ORAL | 0 refills | Status: DC

## 2016-07-17 MED ORDER — SERTRALINE HCL 25 MG PO TABS: ORAL_TABLET | ORAL | 0 refills | Status: DC

## 2016-07-17 MED ORDER — LISINOPRIL-HYDROCHLOROTHIAZIDE 10-12.5 MG PO TABS: 1.0000 | ORAL_TABLET | Freq: Every day | ORAL | 0 refills | Status: DC

## 2016-07-17 NOTE — Telephone Encounter (Signed)
I have sent in the rx for nexium, lisinopril/hctz and sertraline for 30 days locally.   The 90 day rx's were [facility] (per [facility]).  Where does she need the 90 day rx's to go?  Once clarified, ok to send in for 90 days with no refills and schedule a f/u appt.

## 2016-07-17 NOTE — Telephone Encounter (Signed)
I have received call from pt she states that med[facility]. She is now out of medication and would like to have meds called into local pharmacy of 1 month supply and then 90day supply [facility]. I have made f/u app for patient as well.

## 2016-07-17 NOTE — Telephone Encounter (Signed)
Left message to return call to our office.  

## 2016-07-19 MED ORDER — ESOMEPRAZOLE MAGNESIUM 40 MG PO CPDR
40.0000 mg | DELAYED_RELEASE_CAPSULE | Freq: Every day | ORAL | 1 refills | Status: DC
Start: 2016-07-19 — End: 2016-12-06

## 2016-07-19 MED ORDER — LISINOPRIL-HYDROCHLOROTHIAZIDE 10-12.5 MG PO TABS: 1.0000 | ORAL_TABLET | Freq: Every day | ORAL | 1 refills | Status: DC

## 2016-07-19 MED ORDER — SERTRALINE HCL 25 MG PO TABS: ORAL_TABLET | ORAL | 1 refills | Status: DC

## 2016-07-19 NOTE — Telephone Encounter (Signed)
[facility] I have updated information in chart.

## 2016-07-19 NOTE — Telephone Encounter (Signed)
rx sent in for nexium, lisinopril/hctz and sertraline #90 with one refill.

## 2016-07-24 ENCOUNTER — Other Ambulatory Visit: Payer: Self-pay

## 2016-08-01 ENCOUNTER — Other Ambulatory Visit: Payer: Self-pay

## 2016-08-01 MED ORDER — LISINOPRIL-HYDROCHLOROTHIAZIDE 10-12.5 MG PO TABS: 1.0000 | ORAL_TABLET | Freq: Every day | ORAL | 1 refills | Status: DC

## 2016-09-09 ENCOUNTER — Other Ambulatory Visit: Payer: Self-pay | Admitting: Internal Medicine

## 2016-11-06 ENCOUNTER — Ambulatory Visit (INDEPENDENT_AMBULATORY_CARE_PROVIDER_SITE_OTHER): Payer: Medicare HMO | Admitting: Internal Medicine

## 2016-11-06 ENCOUNTER — Encounter: Payer: Self-pay | Admitting: Internal Medicine

## 2016-11-06 VITALS — BP 118/60 | HR 84 | Temp 98.6°F | Resp 12 | Ht 68.0 in | Wt 226.6 lb

## 2016-11-06 DIAGNOSIS — E559 Vitamin D deficiency, unspecified: Secondary | ICD-10-CM

## 2016-11-06 DIAGNOSIS — F329 Major depressive disorder, single episode, unspecified: Secondary | ICD-10-CM

## 2016-11-06 DIAGNOSIS — K219 Gastro-esophageal reflux disease without esophagitis: Secondary | ICD-10-CM | POA: Diagnosis not present

## 2016-11-06 DIAGNOSIS — Z23 Encounter for immunization: Secondary | ICD-10-CM | POA: Diagnosis not present

## 2016-11-06 DIAGNOSIS — I10 Essential (primary) hypertension: Secondary | ICD-10-CM | POA: Diagnosis not present

## 2016-11-06 DIAGNOSIS — F32A Depression, unspecified: Secondary | ICD-10-CM

## 2016-11-06 LAB — BASIC METABOLIC PANEL
BUN: 15 mg/dL (ref 6–23)
CO2: 30 meq/L (ref 19–32)
Calcium: 9.7 mg/dL (ref 8.4–10.5)
Chloride: 103 mEq/L (ref 96–112)
Creatinine, Ser: 0.9 mg/dL (ref 0.40–1.20)
GFR: 65.66 mL/min (ref >60.00)
GLUCOSE: 114 mg/dL — AB (ref 70–99)
POTASSIUM: 3.6 meq/L (ref 3.5–5.1)
SODIUM: 140 meq/L (ref 135–145)

## 2016-11-06 NOTE — Progress Notes (Signed)
[name], female   DOB: [birth year], 71 y.o.   MRN: [id number]   Subjective:    Patient ID: Susan SensingLinda H Foos, female    DOB: [birth year], 71 y.o.   MRN: [id number]  HPI  Patient here for a scheduled follow up.  She reports she is doing relatively well.  Handling stress relatively well.  Has retired.  Not exercising.  Discussed with her today.  Discussed diet and exercise.  No chest pain.  No sob.  No acid reflux.  No abdominal pain or cramping.  Bowels stable.  No light headedness.     Past Medical History:  Diagnosis Date  . Allergy   . Depression   . History of chicken pox   . Hx: UTI (urinary tract infection)   . Hypertension    Past Surgical History:  Procedure Laterality Date  . COLONOSCOPY WITH PROPOFOL N/A 06/20/2016   Procedure: COLONOSCOPY WITH PROPOFOL;  Surgeon: Midge Miniumarren Wohl, MD;  Location: [facility] ENDOSCOPY;  Service: Endoscopy;  Laterality: N/A;  . ESOPHAGOGASTRODUODENOSCOPY (EGD) WITH PROPOFOL N/A 06/20/2016   Procedure: ESOPHAGOGASTRODUODENOSCOPY (EGD) WITH PROPOFOL;  Surgeon: Midge Miniumarren Wohl, MD;  Location: [facility] ENDOSCOPY;  Service: Endoscopy;  Laterality: N/A;  . TONSILLECTOMY     as a child   Family History  Problem Relation Age of Onset  . Hypertension Mother   . Cancer Sister        colon cancer  . Hypertension Sister   . Diabetes Sister   . Hypertension Brother   . Cancer Maternal Aunt        breast cancer  . Hypertension Maternal Aunt   . Breast cancer Maternal Aunt 60  . Hypertension Maternal Uncle   . Stroke Maternal Grandmother   . Hypertension Maternal Grandmother    Social History   Social History  . Marital status: Divorced    Spouse name: N/A  . Number of children: N/A  . Years of education: N/A   Social History Main Topics  . Smoking status: Never Smoker  . Smokeless tobacco: Never Used  . Alcohol use No  . Drug use: No  . Sexual activity: Not Asked   Other Topics Concern  . None   Social History Narrative  . None     Outpatient Encounter Prescriptions as of 11/06/2016  Medication Sig  . esomeprazole (NEXIUM) 40 MG capsule Take 1 capsule (40 mg total) by mouth daily before breakfast.  . lisinopril-hydrochlorothiazide (PRINZIDE,ZESTORETIC) 10-12.5 MG tablet Take 1 tablet by mouth daily.  . sertraline (ZOLOFT) 25 MG tablet Take 1 tablet by mouth  daily  . [DISCONTINUED] lansoprazole (PREVACID SOLUTAB) 30 MG disintegrating tablet Take 1 tablet (30 mg total) by mouth daily.  . [DISCONTINUED] sertraline (ZOLOFT) 25 MG tablet TAKE 1 TABLET BY MOUTH DAILY   No facility-administered encounter medications on file as of 11/06/2016.     Review of Systems  Constitutional: Negative for appetite change and unexpected weight change.  HENT: Negative for congestion and sinus pressure.   Respiratory: Negative for cough, chest tightness and shortness of breath.   Cardiovascular: Negative for chest pain, palpitations and leg swelling.  Gastrointestinal: Negative for abdominal pain, diarrhea, nausea and vomiting.  Genitourinary: Negative for difficulty urinating and dysuria.  Musculoskeletal: Negative for back pain and joint swelling.  Skin: Negative for color change and rash.  Neurological: Negative for dizziness, light-headedness and headaches.  Psychiatric/Behavioral: Negative for agitation and dysphoric mood.       Objective:    Physical Exam  Constitutional:  She appears well-developed and well-nourished. No distress.  HENT:  Nose: Nose normal.  Mouth/Throat: Oropharynx is clear and moist.  Neck: Neck supple. No thyromegaly present.  Cardiovascular: Normal rate and regular rhythm.   Pulmonary/Chest: Breath sounds normal. No respiratory distress. She has no wheezes.  Abdominal: Soft. Bowel sounds are normal. There is no tenderness.  Musculoskeletal: She exhibits no edema or tenderness.  Lymphadenopathy:    She has no cervical adenopathy.  Skin: No rash noted. No erythema.  Psychiatric: She has a normal  mood and affect. Her behavior is normal.    BP 118/60 (BP Location: Left Arm, Patient Position: Sitting, Cuff Size: Large)   Pulse 84   Temp 98.6 F (37 C) (Oral)   Resp 12   Ht 5\' 8"  (1.727 m)   Wt 226 lb 9.6 oz (102.8 kg)   SpO2 96%   BMI 34.45 kg/m  Wt Readings from Last 3 Encounters:  11/06/16 226 lb 9.6 oz (102.8 kg)  06/20/16 212 lb (96.2 kg)  04/21/16 225 lb 12.8 oz (102.4 kg)     Lab Results  Component Value Date   WBC 5.7 04/21/2016   HGB 12.1 04/21/2016   HCT 35.5 04/21/2016   PLT 262 04/21/2016   GLUCOSE 114 (H) 11/06/2016   CHOL 184 04/18/2016   TRIG 115 04/18/2016   HDL 49 04/18/2016   LDLCALC 112 (H) 04/18/2016   ALT 13 04/18/2016   AST 13 04/18/2016   NA 140 11/06/2016   K 3.6 11/06/2016   CL 103 11/06/2016   CREATININE 0.90 11/06/2016   BUN 15 11/06/2016   CO2 30 11/06/2016   TSH 2.320 04/21/2016       Assessment & Plan:   Problem List Items Addressed This Visit    Depression    On zoloft.  Has retired.  Some decreased stress.  Overall stable and doing well.  Follow.        Essential hypertension, benign    Blood pressure under good control.  Continue same medication regimen.  Follow pressures.  Follow metabolic panel.        Relevant Orders   Basic metabolic panel (Completed)   CBC with Differential/Platelet   Comprehensive metabolic panel   TSH   Lipid panel   GERD (gastroesophageal reflux disease)    Controlled on current regimen.  Follow.        Vitamin D deficiency    Recheck vitamin D level.        Relevant Orders   VITAMIN D 25 Hydroxy (Vit-D Deficiency, Fractures)    Other Visit Diagnoses    Need for pneumococcal vaccination    -  Primary       Dale Oxford, MD

## 2016-11-06 NOTE — Assessment & Plan Note (Signed)
Blood pressure under good control.  Continue same medication regimen.  Follow pressures.  Follow metabolic panel.   

## 2016-11-06 NOTE — Progress Notes (Signed)
Pre-visit discussion using our clinic review tool. No additional management support is needed unless otherwise documented below in the visit note.  

## 2016-11-07 ENCOUNTER — Encounter: Payer: Self-pay | Admitting: Internal Medicine

## 2016-11-08 ENCOUNTER — Encounter: Payer: Self-pay | Admitting: Internal Medicine

## 2016-11-08 NOTE — Assessment & Plan Note (Signed)
Controlled on current regimen.  Follow.  

## 2016-11-08 NOTE — Assessment & Plan Note (Signed)
On zoloft.  Has retired.  Some decreased stress.  Overall stable and doing well.  Follow.

## 2016-11-08 NOTE — Assessment & Plan Note (Signed)
Recheck vitamin D level 

## 2016-11-10 NOTE — Telephone Encounter (Signed)
Hard copy mailed  

## 2016-12-06 ENCOUNTER — Other Ambulatory Visit: Payer: Self-pay | Admitting: Internal Medicine

## 2016-12-07 ENCOUNTER — Ambulatory Visit (INDEPENDENT_AMBULATORY_CARE_PROVIDER_SITE_OTHER): Payer: Medicare HMO

## 2016-12-07 VITALS — BP 118/62 | HR 73 | Temp 98.2°F | Resp 14 | Ht 68.0 in | Wt 224.1 lb

## 2016-12-07 DIAGNOSIS — E2839 Other primary ovarian failure: Secondary | ICD-10-CM

## 2016-12-07 DIAGNOSIS — Z0000 Encounter for general adult medical examination without abnormal findings: Secondary | ICD-10-CM | POA: Diagnosis not present

## 2016-12-07 NOTE — Patient Instructions (Addendum)
[name] , Thank you for taking time to come for your Medicare Wellness Visit. I appreciate your ongoing commitment to your health goals. Please review the following plan we discussed and let me know if I can assist you in the future.   Follow up with Dr. Lorin PicketScott as needed.    Bring a copy of your Health Care Power of Attorney and/or Living Will to be scanned into chart once completed.  Dexa Scan ordered, follow as directed.  Have a great day!  These are the goals we discussed: Goals    . Increase lean proteins          Low carb foods    . Increase physical activity          Stay active Silver sneaker program once a week building a routine from 1 day to 3, 30 minutes Ride the bike 5 minutes daily    . Increase water intake          Stay hydrated Drink water and finish 1 cup when taking medications        This is a list of the screening recommended for you and due dates:  Health Maintenance  Topic Date Due  . Tetanus Vaccine  03/06/1965  . DEXA scan (bone density measurement)  03/07/2011  . Flu Shot  12/06/2016  . Mammogram  03/03/2017  . Pneumonia vaccines (2 of 2 - PPSV23) 11/06/2017  . Colon Cancer Screening  06/20/2026  .  Hepatitis C: One time screening is recommended by Center for Disease Control  (CDC) for  adults born from [birth year] through 1965.   Completed    Bone Densitometry Bone densitometry is an imaging test that uses a special X-ray to measure the amount of calcium and other minerals in your bones (bone density). This test is also known as a bone mineral density test or dual-energy X-ray absorptiometry (DXA). The test can measure bone density at your hip and your spine. It is similar to having a regular X-ray. You may have this test to:  Diagnose a condition that causes weak or thin bones (osteoporosis).  Predict your risk of a broken bone (fracture).  Determine how well osteoporosis treatment is working.  Tell a health care provider about:  Any  allergies you have.  All medicines you are taking, including vitamins, herbs, eye drops, creams, and over-the-counter medicines.  Any problems you or family members have had with anesthetic medicines.  Any blood disorders you have.  Any surgeries you have had.  Any medical conditions you have.  Possibility of pregnancy.  Any other medical test you had within the previous 14 days that used contrast material. What are the risks? Generally, this is a safe procedure. However, problems can occur and may include the following:  This test exposes you to a very small amount of radiation.  The risks of radiation exposure may be greater to unborn children.  What happens before the procedure?  Do not take any calcium supplements for 24 hours before having the test. You can otherwise eat and drink what you usually do.  Take off all metal jewelry, eyeglasses, dental appliances, and any other metal objects. What happens during the procedure?  You may lie on an exam table. There will be an X-ray generator below you and an imaging device above you.  Other devices, such as boxes or braces, may be used to position your body properly for the scan.  You will need to lie still while the machine  slowly scans your body.  The images will show up on a computer monitor. What happens after the procedure? You may need more testing at a later time. This information is not intended to replace advice given to you by your health care provider. Make sure you discuss any questions you have with your health care provider. Document Released: 05/16/2004 Document Revised: 09/30/2015 Document Reviewed: 10/02/2013 Elsevier Interactive Patient Education  2018 [employer]

## 2016-12-07 NOTE — Progress Notes (Signed)
Subjective:   Susan Miller is a 71 y.o. female who presents for an Initial Medicare Annual Wellness Visit.  Review of Systems    No ROS.  Medicare Wellness Visit. Additional risk factors are reflected in the social history.  Cardiac Risk Factors include: advanced age (>455men, 17>65 women);hypertension;obesity (BMI >30kg/m2)     Objective:    Today's Vitals   12/07/16 0959  BP: 118/62  Pulse: 73  Resp: 14  Temp: 98.2 F (36.8 C)  TempSrc: Oral  SpO2: 97%  Weight: 224 lb 1.9 oz (101.7 kg)  Height: 5\' 8"  (1.727 m)   Body mass index is 34.08 kg/m.   Current Medications (verified) Outpatient Encounter Prescriptions as of 12/07/2016  Medication Sig  . esomeprazole (NEXIUM) 40 MG capsule TAKE 1 CAPSULE DAILY BEFORE BREAKFAST.  Marland Kitchen. lisinopril-hydrochlorothiazide (PRINZIDE,ZESTORETIC) 10-12.5 MG tablet TAKE 1 TABLET EVERY DAY  . sertraline (ZOLOFT) 25 MG tablet TAKE 1 TABLET EVERY DAY   No facility-administered encounter medications on file as of 12/07/2016.     Allergies (verified) Codeine; Iodinated diagnostic agents; and Nitrofurantoin monohyd macro   History: Past Medical History:  Diagnosis Date  . Allergy   . Depression   . History of chicken pox   . Hx: UTI (urinary tract infection)   . Hypertension    Past Surgical History:  Procedure Laterality Date  . COLONOSCOPY WITH PROPOFOL N/A 06/20/2016   Procedure: COLONOSCOPY WITH PROPOFOL;  Surgeon: Midge Miniumarren Wohl, MD;  Location: [facility] ENDOSCOPY;  Service: Endoscopy;  Laterality: N/A;  . ESOPHAGOGASTRODUODENOSCOPY (EGD) WITH PROPOFOL N/A 06/20/2016   Procedure: ESOPHAGOGASTRODUODENOSCOPY (EGD) WITH PROPOFOL;  Surgeon: Midge Miniumarren Wohl, MD;  Location: [facility] ENDOSCOPY;  Service: Endoscopy;  Laterality: N/A;  . TONSILLECTOMY     as a child   Family History  Problem Relation Age of Onset  . Hypertension Mother   . Cancer Sister        colon cancer  . Hypertension Sister   . Diabetes Sister   . Hypertension Brother   . Cancer  Maternal Aunt        breast cancer  . Hypertension Maternal Aunt   . Breast cancer Maternal Aunt 60  . Hypertension Maternal Uncle   . Stroke Maternal Grandmother   . Hypertension Maternal Grandmother    Social History   Occupational History  . Not on file.   Social History Main Topics  . Smoking status: Never Smoker  . Smokeless tobacco: Never Used  . Alcohol use No  . Drug use: No  . Sexual activity: Not Currently    Tobacco Counseling Counseling given: Not Answered   Activities of Daily Living In your present state of health, do you have any difficulty performing the following activities: 12/07/2016  Hearing? N  Vision? N  Difficulty concentrating or making decisions? N  Walking or climbing stairs? Y  Comment She notes unsteady gait when climbing stairs  Dressing or bathing? N  Doing errands, shopping? N  Preparing Food and eating ? N  Using the Toilet? N  In the past six months, have you accidently leaked urine? N  Do you have problems with loss of bowel control? N  Managing your Medications? N  Managing your Finances? N  Housekeeping or managing your Housekeeping? N  Some recent data might be hidden    Immunizations and Health Maintenance Immunization History  Administered Date(s) Administered  . Influenza, High Dose Seasonal PF 01/11/2016  . Influenza,inj,Quad PF,36+ Mos 02/24/2013, 02/27/2014, 02/22/2015  . Pneumococcal Conjugate-13 11/06/2016  Health Maintenance Due  Topic Date Due  . TETANUS/TDAP  03/06/1965  . DEXA SCAN  03/07/2011  . INFLUENZA VACCINE  12/06/2016    Patient Care Team: Dale Lincolnwood, MD as PCP - General (Internal Medicine)  Indicate any recent Medical Services you may have received from other than [facility] providers in the past year (date may be approximate).     Assessment:   This is a routine wellness examination for Susan Miller. The goal of the wellness visit is to assist the patient how to close the gaps in care and create a  preventative care plan for the patient.   The roster of all physicians providing medical care to patient is listed in the Snapshot section of the chart.  Osteoporosis risk reviewed.    Safety issues reviewed;  Her son lives with her.  Smoke and carbon monoxide detectors in the home. No firearms in the home.  Wears seatbelts when driving or riding with others. Patient does wear sunscreen or protective clothing when in direct sunlight. No violence in the home.  Patient is alert, normal appearance, oriented to person/place/and time.  Correctly identified the president of the [place], recall of 3/3 words, and performing simple calculations. Displays appropriate judgement and can read correct time from watch face.   No new identified risk were noted.  No failures at ADL's or IADL's.   BMI- discussed the importance of a healthy diet, water intake and the benefits of aerobic exercise. Educational material provided.   24 hour diet recall: Breakfast: 1 toast, bacon Lunch: hamburger, french fries Dinner: bbq pork, pinto beans, potatoes Low carb foods encouraged  Daily fluid intake: 2 cups of caffeine, 2 cups of water  Dental- every 6 months.  Dr. Marden Noble.  Eye- Visual acuity not assessed per patient preference since they have regular follow up with the ophthalmologist.  Wears corrective lenses.  Sleep patterns- Sleeps 7-8 hours at night.  Wakes feeling rested.   Dexa Scan ordered; follow as directed.  Educational material provided.  TDAP vaccine deferred per patient preference.  Follow up with insurance.  Educational material provided.  Patient Concerns: None at this time. Follow up with PCP as needed.  Hearing/Vision screen Hearing Screening Comments: Patient is able to hear conversational tones without difficulty.  No issues reported.   Vision Screening Comments: Followed by [facility] Wears corrective lenses when reading Last OV 09/2016 Visual acuity not assessed per patient  preference since they have regular follow up with the ophthalmologist  Dietary issues and exercise activities discussed: Current Exercise Habits: The patient does not participate in regular exercise at present  Goals    . Increase lean proteins          Low carb foods    . Increase physical activity          Stay active Silver sneaker program once a week building a routine from 1 day to 3, 30 minutes Ride the bike 5 minutes daily    . Increase water intake          Stay hydrated Drink water and finish 1 cup when taking medications       Depression Screen PHQ 2/9 Scores 12/07/2016 11/06/2016 04/21/2016 03/15/2015 02/24/2013  PHQ - 2 Score 0 0 2 2 3   PHQ- 9 Score - 3 10 10 9     Fall Risk Fall Risk  12/07/2016 04/21/2016 01/11/2016 03/15/2015 02/27/2014  Falls in the past year? No No Yes No No  Number falls in past yr: - -  1 - -  Injury with Fall? - - No - -    Cognitive Function: MMSE - Mini Mental State Exam 12/07/2016  Orientation to time 5  Orientation to Place 5  Registration 3  Attention/ Calculation 5  Recall 3  Language- name 2 objects 2  Language- repeat 1  Language- follow 3 step command 3  Language- read & follow direction 1  Write a sentence 1  Copy design 1  Total score 30        Screening Tests Health Maintenance  Topic Date Due  . TETANUS/TDAP  03/06/1965  . DEXA SCAN  03/07/2011  . INFLUENZA VACCINE  12/06/2016  . MAMMOGRAM  03/03/2017  . PNA vac Low Risk Adult (2 of 2 - PPSV23) 11/06/2017  . COLONOSCOPY  06/20/2026  . Hepatitis C Screening  Completed      Plan:   End of life planning; Advanced aging; Advanced directives discussed.  No HCPOA/Living Will.  Additional information provided to help them start the conversation with family.  Copy of HCPOA/Living Will requested upon completion. Time spent on this topic is 20 minutes.  I have personally reviewed and noted the following in the patient's chart:   . Medical and social history . Use of  alcohol, tobacco or illicit drugs  . Current medications and supplements . Functional ability and status . Nutritional status . Physical activity . Advanced directives . List of other physicians . Hospitalizations, surgeries, and ER visits in previous 12 months . Vitals . Screenings to include cognitive, depression, and falls . Referrals and appointments  In addition, I have reviewed and discussed with patient certain preventive protocols, quality metrics, and best practice recommendations. A written personalized care plan for preventive services as well as general preventive health recommendations were provided to patient.     Ashok PallOBrien-Blaney, Lilja Soland L, LPN   1/6/10968/06/2016    Reviewed above information.  Agree with assessment and plan.  Dr Lorin PicketScott

## 2016-12-25 ENCOUNTER — Encounter: Payer: Self-pay | Admitting: Internal Medicine

## 2017-01-31 NOTE — Telephone Encounter (Signed)
Error

## 2017-02-01 ENCOUNTER — Ambulatory Visit
Admission: RE | Admit: 2017-02-01 | Discharge: 2017-02-01 | Disposition: A | Discharge status: Home / Self Care | Payer: Medicare HMO | Source: Ambulatory Visit | Attending: Internal Medicine | Admitting: Internal Medicine

## 2017-02-01 DIAGNOSIS — M85852 Other specified disorders of bone density and structure, left thigh: Secondary | ICD-10-CM | POA: Insufficient documentation

## 2017-02-01 DIAGNOSIS — M8589 Other specified disorders of bone density and structure, multiple sites: Secondary | ICD-10-CM | POA: Diagnosis not present

## 2017-02-01 DIAGNOSIS — E2839 Other primary ovarian failure: Secondary | ICD-10-CM | POA: Insufficient documentation

## 2017-02-02 ENCOUNTER — Telehealth: Payer: Self-pay | Admitting: *Deleted

## 2017-02-02 NOTE — Telephone Encounter (Signed)
Pt requested bone density results Pt contact [phone]

## 2017-02-02 NOTE — Telephone Encounter (Signed)
See result note.  

## 2017-04-23 ENCOUNTER — Other Ambulatory Visit: Payer: Medicare HMO

## 2017-04-23 DIAGNOSIS — I10 Essential (primary) hypertension: Secondary | ICD-10-CM | POA: Diagnosis not present

## 2017-04-23 DIAGNOSIS — E559 Vitamin D deficiency, unspecified: Secondary | ICD-10-CM

## 2017-04-24 ENCOUNTER — Other Ambulatory Visit: Payer: Self-pay | Admitting: Internal Medicine

## 2017-04-24 LAB — CBC WITH DIFFERENTIAL/PLATELET
BASOS: 0 %
Basophils Absolute: 0 10*3/uL (ref 0.0–0.2)
EOS (ABSOLUTE): 0.2 10*3/uL (ref 0.0–0.4)
Eos: 2 %
Hematocrit: 36.9 % (ref 34.0–46.6)
Hemoglobin: 12.3 g/dL (ref 11.1–15.9)
IMMATURE GRANS (ABS): 0 10*3/uL (ref 0.0–0.1)
Immature Granulocytes: 0 %
LYMPHS ABS: 2.5 10*3/uL (ref 0.7–3.1)
LYMPHS: 20 %
MCH: 29.9 pg (ref 26.6–33.0)
MCHC: 33.3 g/dL (ref 31.5–35.7)
MCV: 90 fL (ref 79–97)
Monocytes Absolute: 0.9 10*3/uL (ref 0.1–0.9)
Monocytes: 8 %
NEUTROS ABS: 8.6 10*3/uL — AB (ref 1.4–7.0)
Neutrophils: 70 %
PLATELETS: 298 10*3/uL (ref 150–379)
RBC: 4.12 x10E6/uL (ref 3.77–5.28)
RDW: 13.9 % (ref 12.3–15.4)
WBC: 12.3 10*3/uL — ABNORMAL HIGH (ref 3.4–10.8)

## 2017-04-24 LAB — COMPREHENSIVE METABOLIC PANEL
A/G RATIO: 1.8 (ref 1.2–2.2)
ALBUMIN: 4.4 g/dL (ref 3.5–4.8)
ALT: 15 IU/L (ref 0–32)
AST: 14 IU/L (ref 0–40)
Alkaline Phosphatase: 80 IU/L (ref 39–117)
BILIRUBIN TOTAL: 0.2 mg/dL (ref 0.0–1.2)
BUN/Creatinine Ratio: 19 (ref 12–28)
BUN: 19 mg/dL (ref 8–27)
CHLORIDE: 109 mmol/L — AB (ref 96–106)
CO2: 24 mmol/L (ref 20–29)
Calcium: 9.7 mg/dL (ref 8.7–10.3)
Creatinine, Ser: 0.98 mg/dL (ref 0.57–1.00)
GFR calc Af Amer: 67 mL/min/{1.73_m2} (ref >59)
GFR calc non Af Amer: 58 mL/min/{1.73_m2} — ABNORMAL LOW (ref >59)
Globulin, Total: 2.4 g/dL (ref 1.5–4.5)
Glucose: 127 mg/dL — ABNORMAL HIGH (ref 65–99)
POTASSIUM: 4.7 mmol/L (ref 3.5–5.2)
Sodium: 147 mmol/L — ABNORMAL HIGH (ref 134–144)
TOTAL PROTEIN: 6.8 g/dL (ref 6.0–8.5)

## 2017-04-24 LAB — LIPID PANEL
CHOLESTEROL TOTAL: 182 mg/dL (ref 100–199)
Chol/HDL Ratio: 4.2 ratio (ref 0.0–4.4)
HDL: 43 mg/dL (ref >39)
LDL Calculated: 120 mg/dL — ABNORMAL HIGH (ref 0–99)
TRIGLYCERIDES: 93 mg/dL (ref 0–149)
VLDL Cholesterol Cal: 19 mg/dL (ref 5–40)

## 2017-04-24 LAB — TSH: TSH: 2.72 u[IU]/mL (ref 0.450–4.500)

## 2017-04-24 LAB — VITAMIN D 25 HYDROXY (VIT D DEFICIENCY, FRACTURES): Vit D, 25-Hydroxy: 21.7 ng/mL — ABNORMAL LOW (ref 30.0–100.0)

## 2017-04-27 ENCOUNTER — Encounter: Payer: Self-pay | Admitting: Internal Medicine

## 2017-04-27 ENCOUNTER — Ambulatory Visit (INDEPENDENT_AMBULATORY_CARE_PROVIDER_SITE_OTHER): Payer: Medicare HMO | Admitting: Internal Medicine

## 2017-04-27 VITALS — BP 122/82 | HR 95 | Temp 97.7°F | Ht 67.0 in

## 2017-04-27 DIAGNOSIS — Z0000 Encounter for general adult medical examination without abnormal findings: Secondary | ICD-10-CM | POA: Diagnosis not present

## 2017-04-27 DIAGNOSIS — R739 Hyperglycemia, unspecified: Secondary | ICD-10-CM | POA: Diagnosis not present

## 2017-04-27 DIAGNOSIS — E559 Vitamin D deficiency, unspecified: Secondary | ICD-10-CM

## 2017-04-27 DIAGNOSIS — K2100 Gastro-esophageal reflux disease with esophagitis, without bleeding: Secondary | ICD-10-CM

## 2017-04-27 DIAGNOSIS — Z1231 Encounter for screening mammogram for malignant neoplasm of breast: Secondary | ICD-10-CM | POA: Diagnosis not present

## 2017-04-27 DIAGNOSIS — D72829 Elevated white blood cell count, unspecified: Secondary | ICD-10-CM

## 2017-04-27 DIAGNOSIS — Z23 Encounter for immunization: Secondary | ICD-10-CM

## 2017-04-27 DIAGNOSIS — F32A Depression, unspecified: Secondary | ICD-10-CM

## 2017-04-27 DIAGNOSIS — M8580 Other specified disorders of bone density and structure, unspecified site: Secondary | ICD-10-CM

## 2017-04-27 DIAGNOSIS — Z1239 Encounter for other screening for malignant neoplasm of breast: Secondary | ICD-10-CM

## 2017-04-27 DIAGNOSIS — F329 Major depressive disorder, single episode, unspecified: Secondary | ICD-10-CM | POA: Diagnosis not present

## 2017-04-27 DIAGNOSIS — I10 Essential (primary) hypertension: Secondary | ICD-10-CM

## 2017-04-27 NOTE — Progress Notes (Signed)
Patient ID: Susan SensingLinda Miller Miller, female   DOB: 03-14-1946, 71 y.o.   MRN: [id number]   Subjective:    Patient ID: Susan SensingLinda Miller Miller, female    DOB: 03-14-1946, 71 y.o.   MRN: [id number]  HPI  Patient here for her physical exam.  She has retired.  States she is not going out as much.  Not in a routine.  Some increased depression related to this.  Discussed at length with her today.  No suicidal ideations.  Discussed counseling.  She declines.  Does not feel she needs.  Discussed getting out more.  She plans to do this and plans to use sliver sneakers and start exercising.  No chest pain.  No sob.  No acid reflux.  No abdominal pain.  Bowels moving.  Discussed labs.  Discussed cholesterol calculated risk.  Discussed starting cholesterol medication.  Discussed elevated sugar and elevated white blood cell count.  No signs or symptoms of infection.     Past Medical History:  Diagnosis Date  . Allergy   . Depression   . History of chicken pox   . Hx: UTI (urinary tract infection)   . Hypertension    Past Surgical History:  Procedure Laterality Date  . COLONOSCOPY WITH PROPOFOL N/A 06/20/2016   Procedure: COLONOSCOPY WITH PROPOFOL;  Surgeon: Midge Miniumarren Wohl, MD;  Location: [facility] ENDOSCOPY;  Service: Endoscopy;  Laterality: N/A;  . ESOPHAGOGASTRODUODENOSCOPY (EGD) WITH PROPOFOL N/A 06/20/2016   Procedure: ESOPHAGOGASTRODUODENOSCOPY (EGD) WITH PROPOFOL;  Surgeon: Midge Miniumarren Wohl, MD;  Location: [facility] ENDOSCOPY;  Service: Endoscopy;  Laterality: N/A;  . TONSILLECTOMY     as a child   Family History  Problem Relation Age of Onset  . Hypertension Mother   . Cancer Sister        colon cancer  . Hypertension Sister   . Diabetes Sister   . Hypertension Brother   . Cancer Maternal Aunt        breast cancer  . Hypertension Maternal Aunt   . Breast cancer Maternal Aunt 60  . Hypertension Maternal Uncle   . Stroke Maternal Grandmother   . Hypertension Maternal Grandmother    Social History   Socioeconomic  History  . Marital status: Divorced    Spouse name: None  . Number of children: None  . Years of education: None  . Highest education level: None  Social Needs  . Financial resource strain: None  . Food insecurity - worry: None  . Food insecurity - inability: None  . Transportation needs - medical: None  . Transportation needs - non-medical: None  Occupational History  . None  Tobacco Use  . Smoking status: Never Smoker  . Smokeless tobacco: Never Used  Substance and Sexual Activity  . Alcohol use: No    Alcohol/week: 0.0 oz  . Drug use: No  . Sexual activity: Not Currently  Other Topics Concern  . None  Social History Narrative  . None    Outpatient Encounter Medications as of 04/27/2017  Medication Sig  . esomeprazole (NEXIUM) 40 MG capsule TAKE 1 CAPSULE DAILY BEFORE BREAKFAST.  Marland Kitchen. lisinopril-hydrochlorothiazide (PRINZIDE,ZESTORETIC) 10-12.5 MG tablet TAKE 1 TABLET EVERY DAY  . sertraline (ZOLOFT) 25 MG tablet TAKE 1 TABLET EVERY DAY   No facility-administered encounter medications on file as of 04/27/2017.     Review of Systems  Constitutional: Negative for appetite change and unexpected weight change.  HENT: Negative for congestion and sinus pressure.   Eyes: Negative for pain and visual disturbance.  Respiratory: Negative  for cough, chest tightness and shortness of breath.   Cardiovascular: Negative for chest pain, palpitations and leg swelling.  Gastrointestinal: Negative for abdominal pain, diarrhea, nausea and vomiting.  Genitourinary: Negative for difficulty urinating and dysuria.  Musculoskeletal: Negative for back pain and joint swelling.  Skin: Negative for color change and rash.  Neurological: Negative for dizziness, light-headedness and headaches.  Hematological: Negative for adenopathy. Does not bruise/bleed easily.  Psychiatric/Behavioral: Negative for agitation.       Increased stress as outlined.         Objective:    Physical Exam    Constitutional: She is oriented to person, place, and time. She appears well-developed and well-nourished. No distress.  HENT:  Nose: Nose normal.  Mouth/Throat: Oropharynx is clear and moist.  Eyes: Right eye exhibits no discharge. Left eye exhibits no discharge. No scleral icterus.  Neck: Neck supple. No thyromegaly present.  Cardiovascular: Normal rate and regular rhythm.  Pulmonary/Chest: Breath sounds normal. No accessory muscle usage. No tachypnea. No respiratory distress. She has no decreased breath sounds. She has no wheezes. She has no rhonchi. Right breast exhibits no inverted nipple, no mass, no nipple discharge and no tenderness (no axillary adenopathy). Left breast exhibits no inverted nipple, no mass, no nipple discharge and no tenderness (no axilarry adenopathy).  Abdominal: Soft. Bowel sounds are normal. There is no tenderness.  Musculoskeletal: She exhibits no edema or tenderness.  Lymphadenopathy:    She has no cervical adenopathy.  Neurological: She is alert and oriented to person, place, and time.  Skin: Skin is warm. No rash noted. No erythema.  Psychiatric: She has a normal mood and affect. Her behavior is normal.    BP 122/82 (BP Location: Left Arm, Patient Position: Sitting, Cuff Size: Large)   Pulse 95   Temp 97.7 F (36.5 C) (Oral)   Ht 5\' 7"  (1.702 m)   BMI 35.10 kg/m  Wt Readings from Last 3 Encounters:  12/07/16 224 lb 1.9 oz (101.7 kg)  11/06/16 226 lb 9.6 oz (102.8 kg)  06/20/16 212 lb (96.2 kg)     Lab Results  Component Value Date   WBC 7.9 04/27/2017   HGB 12.3 04/27/2017   HCT 36.7 04/27/2017   PLT 294 04/27/2017   GLUCOSE 127 (Miller) 04/23/2017   CHOL 182 04/23/2017   TRIG 93 04/23/2017   HDL 43 04/23/2017   LDLCALC 120 (Miller) 04/23/2017   ALT 15 04/23/2017   AST 14 04/23/2017   NA 147 (Miller) 04/23/2017   K 4.7 04/23/2017   CL 109 (Miller) 04/23/2017   CREATININE 0.98 04/23/2017   BUN 19 04/23/2017   CO2 24 04/23/2017   TSH 2.720 04/23/2017    HGBA1C 6.1 (Miller) 04/27/2017    Dexascan  Result Date: 02/01/2017 EXAM: DUAL X-RAY ABSORPTIOMETRY (DXA) FOR BONE MINERAL DENSITY IMPRESSION: Dear Dr. Dale Richland, Your patient Susan Miller completed a FRAX assessment on 02/01/2017 using the Lunar iDXA DXA System (analysis version: 14.10) manufactured by [employer]. The following summarizes the results of our evaluation. PATIENT BIOGRAPHICAL: Name: Susan Miller, Susan Miller Patient ID: [id number] Birth Date: [birth year] Height:  67.5 in. Gender:  Female [age]  [age] Weight:  224.1 lbs. Ethnicity:  White Exam Date: 02/01/2017 FRAX* RESULTS:  (version: 3.5) 10-year Probability of Fracture(1) Major Osteoporotic Fracture (2): 12.0% Hip Fracture:  2.6% Population:  [place] (Caucasian) Risk Factors:  None Based on Femur (Left) Neck BMD (1) -The 10-year probability of fracture may be lower than reported if the patient has received  treatment. (2) -Major Osteoporotic Fracture: Clinical Spine, Forearm, Hip or Shoulder *FRAX is a Armed forces logistics/support/administrative officertrademark of the [employer] Centre for Metabolic Bone Disease, a World Science writerHealth Organization (WHO) [employer]. ASSESSMENT: The probability of a major osteoporotic fracture is 12.0% within the next ten years. The probability of a hip fracture is 2.6% within the next ten years.Dear Dr. Dale Durhamharlene Rubye Strohmeyer, Your patient Susan PuntLinda Miller completed a BMD test on 02/01/2017 using the [facility] iDXA DXA System (analysis version: 14.10) manufactured by [employer]. The following summarizes the results of our evaluation. PATIENT BIOGRAPHICAL: Name: Susan Miller, Susan Miller Patient ID:  [id number] Birth Date: 08-05-1945 Height:     67.5 in. Gender:      Female Exam Date:  02/01/2017 Weight:     224.1 lbs. Indications: Advanced Age, Caucasian, Height Loss, Postmenopausal Fractures: Treatments: ASSESSMENT: The BMD measured at Femur Neck Left is 0.740 g/cm2 with a T-score of -2.1. This patient is considered osteopenic according to World Health Organization  [facility](WHO) criteria. Lumbar spine was not utilized due to advanced degenerative changes. Site Region Measured Date Measured Age WHO Classification Young Adult T-score BMD DualFemur Neck Left 02/01/2017 70.9 Osteopenia -2.1 0.740 g/cm2 Left Forearm Radius 33% 02/01/2017 70.9 Osteopenia -1.6 0.740 g/cm2 World Health Organization [facility](WHO) criteria for post-menopausal, Caucasian Women: Normal:       T-score at or above -1 SD Osteopenia:   T-score between -1 and -2.5 SD Osteoporosis: T-score at or below -2.5 SD RECOMMENDATIONS: National Osteoporosis Foundation recommends that FDA-approved medical therapies be considered in postmenopausal women and men age 71 or older with a: 1. Hip or vertebral (clinical or morphometric) fracture. 2. T-score of < -2.5 at the spine or hip. 3. Ten-year fracture probability by FRAX of 3% or greater for hip fracture or 20% or greater for major osteoporotic fracture. All treatment decisions require clinical judgment and consideration of individual patient factors, including patient preferences, co-morbidities, previous drug use, risk factors not captured in the FRAX model (e.g. falls, vitamin D deficiency, increased bone turnover, interval significant decline in bone density) and possible under - or over-estimation of fracture risk by FRAX. All patients should ensure an adequate intake of dietary calcium (1200 mg/d) and vitamin D (800 IU daily) unless contraindicated. FOLLOW-UP: People with diagnosed cases of osteoporosis or at high risk for fracture should have regular bone mineral density tests. For patients eligible for Medicare, routine testing is allowed once every 2 years. The testing frequency can be increased to one year for patients who have rapidly progressing disease, those who are receiving or discontinuing medical therapy to restore bone mass, or have additional risk factors. I have reviewed this report, and agree with the above findings. Interpreted by: Quincy Carnesommy Lawrence, MD, CCD  (Certified Clinical Densitometrist) [facility] Electronically Signed   By: Hulan Saashomas  Lawrence M.D.   On: 02/01/2017 10:12       Assessment & Plan:   Problem List Items Addressed This Visit    Depression    On zoloft.  Has retired.  Discussed with her today.  No suicidal ideations.  Discussed counseling.  She declines.  Plans to start exercising and getting out more.  Follow.        Essential hypertension, benign    Blood pressure under good control.  Continue same medication regimen.  Follow pressures.  Follow metabolic panel.        Health care maintenance    Physical today 04/27/17.  Schedule mammogram - overdue.  Colonoscopy 06/20/16 - internal hemorrhoids.  Recommended f/u in 10 year.        Hyperglycemia   Relevant Orders   Hemoglobin A1c (Completed)   Osteopenia    Weight bearing exercise.  Dietary calcium.  Follow.        Reflux esophagitis    Controlled on nexium.  Follow.        Vitamin D deficiency    Follow vitamin D level.         Other Visit Diagnoses    Need for influenza vaccination    -  Primary   Relevant Orders   Flu vaccine HIGH DOSE PF (Fluzone High dose) (Completed)   Breast cancer screening       Relevant Orders   MM SCREENING BREAST TOMO BILATERAL   Leukocytosis, unspecified type       no evidence of infection.  recheck cbc.     Relevant Orders   CBC w/Diff (Completed)   Routine general medical examination at a health care facility           Dale Hallam, MD

## 2017-04-27 NOTE — Assessment & Plan Note (Addendum)
Physical today 04/27/17.  Schedule mammogram - overdue.  Colonoscopy 06/20/16 - internal hemorrhoids.  Recommended f/u in 10 year.

## 2017-04-28 LAB — CBC WITH DIFFERENTIAL/PLATELET
Basophils Absolute: 0 10*3/uL (ref 0.0–0.2)
Basos: 0 %
EOS (ABSOLUTE): 0.2 10*3/uL (ref 0.0–0.4)
Eos: 2 %
Hematocrit: 36.7 % (ref 34.0–46.6)
Hemoglobin: 12.3 g/dL (ref 11.1–15.9)
IMMATURE GRANULOCYTES: 0 %
Immature Grans (Abs): 0 10*3/uL (ref 0.0–0.1)
Lymphocytes Absolute: 2.9 10*3/uL (ref 0.7–3.1)
Lymphs: 37 %
MCH: 29.7 pg (ref 26.6–33.0)
MCHC: 33.5 g/dL (ref 31.5–35.7)
MCV: 89 fL (ref 79–97)
MONOS ABS: 0.7 10*3/uL (ref 0.1–0.9)
Monocytes: 9 %
NEUTROS PCT: 52 %
Neutrophils Absolute: 4.1 10*3/uL (ref 1.4–7.0)
Platelets: 294 10*3/uL (ref 150–379)
RBC: 4.14 x10E6/uL (ref 3.77–5.28)
RDW: 13.6 % (ref 12.3–15.4)
WBC: 7.9 10*3/uL (ref 3.4–10.8)

## 2017-04-28 LAB — HEMOGLOBIN A1C
Est. average glucose Bld gHb Est-mCnc: 128 mg/dL
HEMOGLOBIN A1C: 6.1 % — AB (ref 4.8–5.6)

## 2017-04-29 ENCOUNTER — Encounter: Payer: Self-pay | Admitting: Internal Medicine

## 2017-04-29 DIAGNOSIS — M8580 Other specified disorders of bone density and structure, unspecified site: Secondary | ICD-10-CM | POA: Insufficient documentation

## 2017-04-29 NOTE — Assessment & Plan Note (Signed)
On zoloft.  Has retired.  Discussed with her today.  No suicidal ideations.  Discussed counseling.  She declines.  Plans to start exercising and getting out more.  Follow.

## 2017-04-29 NOTE — Assessment & Plan Note (Signed)
Follow vitamin D level.  

## 2017-04-29 NOTE — Assessment & Plan Note (Signed)
Blood pressure under good control.  Continue same medication regimen.  Follow pressures.  Follow metabolic panel.   

## 2017-04-29 NOTE — Assessment & Plan Note (Signed)
Controlled on nexium.  Follow.  

## 2017-04-29 NOTE — Assessment & Plan Note (Signed)
Weight bearing exercise.  Dietary calcium.  Follow.   

## 2017-05-15 DIAGNOSIS — H5213 Myopia, bilateral: Secondary | ICD-10-CM | POA: Diagnosis not present

## 2017-05-22 ENCOUNTER — Ambulatory Visit
Admission: RE | Admit: 2017-05-22 | Discharge: 2017-05-22 | Disposition: A | Discharge status: Home / Self Care | Payer: Medicare HMO | Source: Ambulatory Visit | Attending: Internal Medicine | Admitting: Internal Medicine

## 2017-05-22 DIAGNOSIS — Z1239 Encounter for other screening for malignant neoplasm of breast: Secondary | ICD-10-CM

## 2017-05-22 DIAGNOSIS — Z1231 Encounter for screening mammogram for malignant neoplasm of breast: Secondary | ICD-10-CM | POA: Diagnosis not present

## 2017-07-11 DIAGNOSIS — B9689 Other specified bacterial agents as the cause of diseases classified elsewhere: Secondary | ICD-10-CM | POA: Diagnosis not present

## 2017-07-11 DIAGNOSIS — J019 Acute sinusitis, unspecified: Secondary | ICD-10-CM | POA: Diagnosis not present

## 2017-07-11 DIAGNOSIS — J40 Bronchitis, not specified as acute or chronic: Secondary | ICD-10-CM | POA: Diagnosis not present

## 2017-08-22 ENCOUNTER — Other Ambulatory Visit: Payer: Medicare HMO

## 2017-08-27 ENCOUNTER — Ambulatory Visit: Payer: Medicare HMO | Admitting: Internal Medicine

## 2017-11-26 ENCOUNTER — Other Ambulatory Visit: Payer: Self-pay | Admitting: Internal Medicine

## 2017-11-26 ENCOUNTER — Telehealth: Payer: Self-pay | Admitting: Radiology

## 2017-11-26 DIAGNOSIS — R739 Hyperglycemia, unspecified: Secondary | ICD-10-CM

## 2017-11-26 DIAGNOSIS — I10 Essential (primary) hypertension: Secondary | ICD-10-CM

## 2017-11-26 NOTE — Telephone Encounter (Signed)
Labs ordered.

## 2017-11-26 NOTE — Telephone Encounter (Signed)
Pt coming in for labs Thursday, please place future orders. Thank you.  

## 2017-11-26 NOTE — Progress Notes (Signed)
Labs ordered.

## 2017-11-29 ENCOUNTER — Other Ambulatory Visit (INDEPENDENT_AMBULATORY_CARE_PROVIDER_SITE_OTHER): Payer: Medicare HMO

## 2017-11-29 DIAGNOSIS — R739 Hyperglycemia, unspecified: Secondary | ICD-10-CM

## 2017-11-29 DIAGNOSIS — I10 Essential (primary) hypertension: Secondary | ICD-10-CM | POA: Diagnosis not present

## 2017-11-29 LAB — HEPATIC FUNCTION PANEL
ALBUMIN: 4.1 g/dL (ref 3.5–5.2)
ALK PHOS: 71 U/L (ref 39–117)
ALT: 12 U/L (ref 0–35)
AST: 13 U/L (ref 0–37)
BILIRUBIN TOTAL: 0.5 mg/dL (ref 0.2–1.2)
Bilirubin, Direct: 0.1 mg/dL (ref 0.0–0.3)
Total Protein: 7.2 g/dL (ref 6.0–8.3)

## 2017-11-29 LAB — LIPID PANEL
Cholesterol: 189 mg/dL (ref 0–200)
HDL: 42.5 mg/dL (ref >39.00)
LDL CALC: 124 mg/dL — AB (ref 0–99)
NonHDL: 146.37
TRIGLYCERIDES: 114 mg/dL (ref 0.0–149.0)
Total CHOL/HDL Ratio: 4
VLDL: 22.8 mg/dL (ref 0.0–40.0)

## 2017-11-29 LAB — BASIC METABOLIC PANEL
BUN: 14 mg/dL (ref 6–23)
CHLORIDE: 106 meq/L (ref 96–112)
CO2: 29 mEq/L (ref 19–32)
CREATININE: 0.93 mg/dL (ref 0.40–1.20)
Calcium: 9.6 mg/dL (ref 8.4–10.5)
GFR: 63.03 mL/min (ref >60.00)
Glucose, Bld: 123 mg/dL — ABNORMAL HIGH (ref 70–99)
POTASSIUM: 4.1 meq/L (ref 3.5–5.1)
Sodium: 142 mEq/L (ref 135–145)

## 2017-11-29 LAB — HEMOGLOBIN A1C: Hgb A1c MFr Bld: 6.2 % (ref 4.6–6.5)

## 2017-11-30 ENCOUNTER — Encounter: Payer: Self-pay | Admitting: Internal Medicine

## 2017-12-06 ENCOUNTER — Ambulatory Visit (INDEPENDENT_AMBULATORY_CARE_PROVIDER_SITE_OTHER): Payer: Medicare HMO | Admitting: Internal Medicine

## 2017-12-06 ENCOUNTER — Other Ambulatory Visit: Payer: Self-pay | Admitting: Internal Medicine

## 2017-12-06 ENCOUNTER — Encounter: Payer: Self-pay | Admitting: Internal Medicine

## 2017-12-06 ENCOUNTER — Ambulatory Visit (INDEPENDENT_AMBULATORY_CARE_PROVIDER_SITE_OTHER): Payer: Medicare HMO

## 2017-12-06 VITALS — BP 122/68 | HR 88 | Temp 97.9°F | Resp 15 | Ht 67.5 in | Wt 224.8 lb

## 2017-12-06 DIAGNOSIS — I10 Essential (primary) hypertension: Secondary | ICD-10-CM | POA: Diagnosis not present

## 2017-12-06 DIAGNOSIS — Z23 Encounter for immunization: Secondary | ICD-10-CM | POA: Diagnosis not present

## 2017-12-06 DIAGNOSIS — K219 Gastro-esophageal reflux disease without esophagitis: Secondary | ICD-10-CM

## 2017-12-06 DIAGNOSIS — F32A Depression, unspecified: Secondary | ICD-10-CM

## 2017-12-06 DIAGNOSIS — R140 Abdominal distension (gaseous): Secondary | ICD-10-CM | POA: Diagnosis not present

## 2017-12-06 DIAGNOSIS — E559 Vitamin D deficiency, unspecified: Secondary | ICD-10-CM

## 2017-12-06 DIAGNOSIS — F329 Major depressive disorder, single episode, unspecified: Secondary | ICD-10-CM | POA: Diagnosis not present

## 2017-12-06 DIAGNOSIS — R739 Hyperglycemia, unspecified: Secondary | ICD-10-CM

## 2017-12-06 DIAGNOSIS — Z0000 Encounter for general adult medical examination without abnormal findings: Secondary | ICD-10-CM | POA: Diagnosis not present

## 2017-12-06 DIAGNOSIS — L659 Nonscarring hair loss, unspecified: Secondary | ICD-10-CM | POA: Diagnosis not present

## 2017-12-06 NOTE — Progress Notes (Addendum)
Subjective:   Susan Miller is a 72 y.o. female who presents for Medicare Annual (Subsequent) preventive examination.  Review of Systems:  No ROS.  Medicare Wellness Visit. Additional risk factors are reflected in the social history.  Cardiac Risk Factors include: advanced age (>72men, >32 women);hypertension     Objective:     Vitals: BP 122/68 (BP Location: Left Arm, Patient Position: Sitting, Cuff Size: Normal)   Pulse 88   Temp 97.9 F (36.6 C) (Oral)   Resp 15   Ht 5' 7.5" (1.715 m)   Wt 224 lb 12.8 oz (102 kg)   SpO2 95%   BMI 34.69 kg/m   Body mass index is 34.69 kg/m.  Advanced Directives 12/06/2017 12/07/2016 06/20/2016  Does Patient Have a Medical Advance Directive? No No No  Does patient want to make changes to medical advance directive? No - Patient declined - -  Would patient like information on creating a medical advance directive? - Yes (MAU/Ambulatory/Procedural Areas - Information given) -    Tobacco Social History   Tobacco Use  Smoking Status Never Smoker  Smokeless Tobacco Never Used     Counseling given: Not Answered   Clinical Intake:  Pre-visit preparation completed: Yes  Pain : No/denies pain     Nutritional Status: BMI > 30  Obese Diabetes: No  How often do you need to have someone help you when you read instructions, pamphlets, or other written materials from your doctor or pharmacy?: 1 - Never  Interpreter Needed?: No     Past Medical History:  Diagnosis Date  . Allergy   . Depression   . History of chicken pox   . Hx: UTI (urinary tract infection)   . Hypertension    Past Surgical History:  Procedure Laterality Date  . COLONOSCOPY WITH PROPOFOL N/A 06/20/2016   Procedure: COLONOSCOPY WITH PROPOFOL;  Surgeon: Midge Minium, MD;  Location: [facility] ENDOSCOPY;  Service: Endoscopy;  Laterality: N/A;  . ESOPHAGOGASTRODUODENOSCOPY (EGD) WITH PROPOFOL N/A 06/20/2016   Procedure: ESOPHAGOGASTRODUODENOSCOPY (EGD) WITH PROPOFOL;   Surgeon: Midge Minium, MD;  Location: [facility] ENDOSCOPY;  Service: Endoscopy;  Laterality: N/A;  . TONSILLECTOMY     as a child   Family History  Problem Relation Age of Onset  . Hypertension Mother   . Cancer Sister        colon cancer  . Hypertension Sister   . Diabetes Sister   . Hypertension Brother   . Hypertension Maternal Aunt   . Breast cancer Maternal Aunt 60  . Hypertension Maternal Uncle   . Stroke Maternal Grandmother   . Hypertension Maternal Grandmother    Social History   Socioeconomic History  . Marital status: Divorced    Spouse name: Not on file  . Number of children: Not on file  . Years of education: Not on file  . Highest education level: Not on file  Occupational History  . Not on file  Social Needs  . Financial resource strain: Not hard at all  . Food insecurity:    Worry: Never true    Inability: Never true  . Transportation needs:    Medical: No    Non-medical: No  Tobacco Use  . Smoking status: Never Smoker  . Smokeless tobacco: Never Used  Substance and Sexual Activity  . Alcohol use: No    Alcohol/week: 0.0 oz  . Drug use: No  . Sexual activity: Not Currently  Lifestyle  . Physical activity:    Days per week: 0  days    Minutes per session: Not on file  . Stress: Not on file  Relationships  . Social connections:    Talks on phone: Not on file    Gets together: Not on file    Attends religious service: Not on file    Active member of club or organization: Not on file    Attends meetings of clubs or organizations: Not on file    Relationship status: Not on file  Other Topics Concern  . Not on file  Social History Narrative  . Not on file    Outpatient Encounter Medications as of 12/06/2017  Medication Sig  . esomeprazole (NEXIUM) 40 MG capsule TAKE 1 CAPSULE DAILY BEFORE BREAKFAST.  . [DISCONTINUED] lisinopril-hydrochlorothiazide (PRINZIDE,ZESTORETIC) 10-12.5 MG tablet TAKE 1 TABLET EVERY DAY  . [DISCONTINUED] sertraline (ZOLOFT)  25 MG tablet TAKE 1 TABLET EVERY DAY   No facility-administered encounter medications on file as of 12/06/2017.     Activities of Daily Living In your present state of health, do you have any difficulty performing the following activities: 12/06/2017 04/27/2017  Hearing? N N  Vision? N N  Comment - left eye, clogged ducts, itchy scraty eye(s)  Difficulty concentrating or making decisions? N N  Walking or climbing stairs? Y N  Comment - -  Dressing or bathing? N N  Doing errands, shopping? N N  Preparing Food and eating ? N -  Using the Toilet? N -  In the past six months, have you accidently leaked urine? N -  Do you have problems with loss of bowel control? N -  Managing your Medications? N -  Managing your Finances? N -  Housekeeping or managing your Housekeeping? N -  Some recent data might be hidden    Patient Care Team: Dale DurhamScott, Charlene, MD as PCP - General (Internal Medicine)    Assessment:   This is a routine wellness examination for [name]. The goal of the wellness visit is to assist the patient how to close the gaps in care and create a preventative care plan for the patient.   The roster of all physicians providing medical care to patient is listed in the Snapshot section of the chart.  Osteoporosis risk reviewed.    Safety issues reviewed; Lives with her son. Smoke detectors in the home. No firearms in the home. Wears seatbelts when driving or riding with others. No violence in the home.  They do not have excessive sun exposure.  Discussed the need for sun protection: hats, long sleeves and the use of sunscreen if there is significant sun exposure.  Patient is alert, normal appearance, oriented to person/place/and time. Correctly identified the president of the [place] and recalls of 3/3 words.Performs simple calculations and can read correct time from watch face. Displays appropriate judgement.  No failures at ADL's or IADL's.    BMI- discussed the importance of a  healthy diet, water intake and the benefits of aerobic exercise. Educational material provided. EMMI assigned reading to mychart for low carb diet.   24 hour diet recall: Regular diet  Dental- every 6 months.  Susan PaceMark Miller.   Pneumococcal 23 vaccine administered R deltoid, tolerated well. No verbal complaint, no signs of distress when given. Educational material provided.  TDAP vaccine deferred per patient preference.  Follow up with insurance.  Educational material provided.  Patient Concerns:  Bloating x3 weeks; feeling of fullness. She notes  her diet is poor and snacks throughout the day more than having healthy balanced meals.  No change in flatulence.  Occasional constipation. Increased water and fiber intake encouraged to help with constipation. Hair loss ongoing for more than 1 year.  She has been seen by dermatology in the past. Deferred to pcp for follow up.   Notes lack of motivation when making healthy food choices for herself and with exercise.  She cooks the meals and manages the home while her adult son stays with her. Desires to lose 50lbs and plans to introduce a low carb diet plan as well as joining [employer] gym to exercise with a goal of 5 days a week, 30 minutes.  Exercise Activities and Dietary recommendations Current Exercise Habits: The patient does not participate in regular exercise at present  Goals    . DIET - INCREASE WATER INTAKE     Stay hydrated    . Increase physical activity     Start exercising with the [employer] program Exercise 150 minutes per week (5 days, 30 minutes) Lose 50lbs       Fall Risk Fall Risk  12/06/2017 12/07/2016 04/21/2016 01/11/2016 03/15/2015  Falls in the past year? No No No Yes No  Number falls in past yr: - - - 1 -  Injury with Fall? - - - No -   Depression Screen PHQ 2/9 Scores 12/06/2017 04/27/2017 12/07/2016 11/06/2016  PHQ - 2 Score 2 6 0 0  PHQ- 9 Score 5 - - 3     Cognitive Function MMSE - Mini Mental State Exam  12/06/2017 12/07/2016  Orientation to time 5 5  Orientation to Place 5 5  Registration 3 3  Attention/ Calculation 5 5  Recall 3 3  Language- name 2 objects 2 2  Language- repeat 1 1  Language- follow 3 step command 3 3  Language- read & follow direction 1 1  Write a sentence 1 1  Copy design 1 1  Total score 30 30        Immunization History  Administered Date(s) Administered  . Influenza, High Dose Seasonal PF 01/11/2016, 04/27/2017  . Influenza,inj,Quad PF,6+ Mos 02/24/2013, 02/27/2014, 02/22/2015  . Pneumococcal Conjugate-13 11/06/2016  . Pneumococcal Polysaccharide-23 12/06/2017   Screening Tests Health Maintenance  Topic Date Due  . TETANUS/TDAP  03/06/1965  . INFLUENZA VACCINE  12/06/2017  . MAMMOGRAM  05/22/2018  . COLONOSCOPY  06/20/2026  . DEXA SCAN  Completed  . Hepatitis C Screening  Completed  . PNA vac Low Risk Adult  Completed      Plan:   End of life planning; Advanced aging; Advanced directives discussed.  No HCPOA/Living Will.  Additional information declined at this time.  I have personally reviewed and noted the following in the patient's chart:   . Medical and social history . Use of alcohol, tobacco or illicit drugs  . Current medications and supplements . Functional ability and status . Nutritional status . Physical activity . Advanced directives . List of other physicians . Hospitalizations, surgeries, and ER visits in previous 12 months . Vitals . Screenings to include cognitive, depression, and falls . Referrals and appointments  In addition, I have reviewed and discussed with patient certain preventive protocols, quality metrics, and best practice recommendations. A written personalized care plan for preventive services as well as general preventive health recommendations were provided to patient.     Ashok Pall, LPN  0/01/6044   Reviewed above information.  Pt seen and evaluated for concerns.  See my office note.    Dr  Lorin Picket

## 2017-12-06 NOTE — Progress Notes (Signed)
[name], female   DOB: Jul 24, 1945, 72 y.o.   MRN: [id number]   Subjective:    [name], female    DOB: Sep 04, 1945, 72 y.o.   MRN: [id number]  HPI  Patient here for a scheduled follow up.  She reports she is doing relatively well.  Has noticed problems with increased bloating.  States this has been present for 3-4 weeks.  Intermittently worse. Feels full fast.  Appetite comes and goes.  Was questioning if dairy could aggravate.  No significant abdominal pain.  Bowels are moving.  No significant problems with constipation.  Does report having problems with hemorrhoids.  Has noticed BRB on tissue.  No chest pain.  No sob.  Does have allergy to contrast dye  Causes her to swell.  Still with increased stress.  Discussed with her today.  She does not feel she needs any further intervention at this time.  No suicidal thoughts.     Past Medical History:  Diagnosis Date  . Allergy   . Depression   . History of chicken pox   . Hx: UTI (urinary tract infection)   . Hypertension    Past Surgical History:  Procedure Laterality Date  . COLONOSCOPY WITH PROPOFOL N/A 06/20/2016   Procedure: COLONOSCOPY WITH PROPOFOL;  Surgeon: Lucilla Lame, MD;  Location: [facility];  Service: Endoscopy;  Laterality: N/A;  . ESOPHAGOGASTRODUODENOSCOPY (EGD) WITH PROPOFOL N/A 06/20/2016   Procedure: ESOPHAGOGASTRODUODENOSCOPY (EGD) WITH PROPOFOL;  Surgeon: Lucilla Lame, MD;  Location: [facility];  Service: Endoscopy;  Laterality: N/A;  . TONSILLECTOMY     as a child   Family History  Problem Relation Age of Onset  . Hypertension Mother   . Cancer Sister        colon cancer  . Hypertension Sister   . Diabetes Sister   . Hypertension Brother   . Hypertension Maternal Aunt   . Breast cancer Maternal Aunt 60  . Hypertension Maternal Uncle   . Stroke Maternal Grandmother   . Hypertension Maternal Grandmother    Social History   Socioeconomic History  . Marital status:  Divorced    Spouse name: Not on file  . Number of children: Not on file  . Years of education: Not on file  . Highest education level: Not on file  Occupational History  . Not on file  Social Needs  . Financial resource strain: Not hard at all  . Food insecurity:    Worry: Never true    Inability: Never true  . Transportation needs:    Medical: No    Non-medical: No  Tobacco Use  . Smoking status: Never Smoker  . Smokeless tobacco: Never Used  Substance and Sexual Activity  . Alcohol use: No    Alcohol/week: 0.0 oz  . Drug use: No  . Sexual activity: Not Currently  Lifestyle  . Physical activity:    Days per week: 0 days    Minutes per session: Not on file  . Stress: Not on file  Relationships  . Social connections:    Talks on phone: Not on file    Gets together: Not on file    Attends religious service: Not on file    Active member of club or organization: Not on file    Attends meetings of clubs or organizations: Not on file    Relationship status: Not on file  Other Topics Concern  . Not on file  Social History Narrative  . Not  on file    Outpatient Encounter Medications as of 12/06/2017  Medication Sig  . esomeprazole (NEXIUM) 40 MG capsule TAKE 1 CAPSULE DAILY BEFORE BREAKFAST.  Marland Kitchen lisinopril-hydrochlorothiazide (PRINZIDE,ZESTORETIC) 10-12.5 MG tablet TAKE 1 TABLET EVERY DAY  . sertraline (ZOLOFT) 25 MG tablet TAKE 1 TABLET EVERY DAY  . [DISCONTINUED] lisinopril-hydrochlorothiazide (PRINZIDE,ZESTORETIC) 10-12.5 MG tablet TAKE 1 TABLET EVERY DAY  . [DISCONTINUED] sertraline (ZOLOFT) 25 MG tablet TAKE 1 TABLET EVERY DAY   No facility-administered encounter medications on file as of 12/06/2017.     Review of Systems  Constitutional: Positive for appetite change. Negative for unexpected weight change.  HENT: Negative for congestion and sinus pressure.   Respiratory: Negative for cough, chest tightness and shortness of breath.   Cardiovascular: Negative for chest  pain, palpitations and leg swelling.  Gastrointestinal: Negative for diarrhea, nausea and vomiting.       Abdominal bloating.  Feels full fast.    Genitourinary: Negative for difficulty urinating and dysuria.  Musculoskeletal: Negative for joint swelling and myalgias.  Skin: Negative for color change and rash.  Neurological: Negative for dizziness, light-headedness and headaches.  Psychiatric/Behavioral: Negative for agitation and dysphoric mood.       Objective:    Physical Exam  Constitutional: She appears well-developed and well-nourished. No distress.  HENT:  Nose: Nose normal.  Mouth/Throat: Oropharynx is clear and moist.  Neck: Neck supple. No thyromegaly present.  Cardiovascular: Normal rate and regular rhythm.  Pulmonary/Chest: Breath sounds normal. No respiratory distress. She has no wheezes.  Abdominal: Soft. Bowel sounds are normal. There is no tenderness.  Musculoskeletal: She exhibits no edema or tenderness.  Lymphadenopathy:    She has no cervical adenopathy.  Skin: No rash noted. No erythema.  Psychiatric: She has a normal mood and affect. Her behavior is normal.    BP 122/68 (BP Location: Left Arm, Patient Position: Sitting, Cuff Size: Normal)   Pulse 88   Resp 15   Wt 224 lb (101.6 kg)   SpO2 95%   BMI 34.57 kg/m  Wt Readings from Last 3 Encounters:  12/06/17 224 lb (101.6 kg)  12/06/17 224 lb 12.8 oz (102 kg)  12/07/16 224 lb 1.9 oz (101.7 kg)     Lab Results  Component Value Date   WBC 7.9 04/27/2017   HGB 12.3 04/27/2017   HCT 36.7 04/27/2017   PLT 294 04/27/2017   GLUCOSE 123 (H) 11/29/2017   CHOL 189 11/29/2017   TRIG 114.0 11/29/2017   HDL 42.50 11/29/2017   LDLCALC 124 (H) 11/29/2017   ALT 12 11/29/2017   AST 13 11/29/2017   NA 142 11/29/2017   K 4.1 11/29/2017   CL 106 11/29/2017   CREATININE 0.93 11/29/2017   BUN 14 11/29/2017   CO2 29 11/29/2017   TSH 2.720 04/23/2017   HGBA1C 6.2 11/29/2017    Mm Screening Breast Tomo  Bilateral  Result Date: 05/22/2017 CLINICAL DATA:  Screening. EXAM: 2D DIGITAL SCREENING BILATERAL MAMMOGRAM WITH 3D TOMO WITH CAD COMPARISON:  Previous exam(s). ACR Breast Density Category b: There are scattered areas of fibroglandular density. FINDINGS: There are no findings suspicious for malignancy. Images were processed with CAD. IMPRESSION: No mammographic evidence of malignancy. A result letter of this screening mammogram will be mailed directly to the patient. RECOMMENDATION: Screening mammogram in one year. (Code:SM-B-01Y) BI-RADS CATEGORY  1: Negative. Electronically Signed   By: Margarette Canada M.D.   On: 05/22/2017 11:59       Assessment & Plan:   Problem List  Items Addressed This Visit    Abdominal bloating    Reports the abdominal bloating and appetite change as outlined.  Feels full fast.  Persistent issue.  Will check CT abdomen and pelvis.  Have her keep food diary.  May need GI evaluation.        Relevant Orders   CT Abdomen Pelvis Wo Contrast   Depression    On zoloft.  Discussed with her today.  No suicidal ideations.  Does not feel needs anything more at this time.  Follow.        Essential hypertension, benign    Blood pressure under good control.  Continue same medication regimen.  Follow pressures.  Follow metabolic panel.        GERD (gastroesophageal reflux disease)    Controlled on current regimen.  Follow.       Hair loss    Did report issues with hair loss.  Discussed dermatology referral.  Follow. Desires no further intervention.       Hyperglycemia    Low carb diet and exercise.  Follow met b and a1c.       Vitamin D deficiency    Follow vitamin D level.        Other Visit Diagnoses    Abdominal distension (gaseous)       Relevant Orders   CT Abdomen Pelvis Wo Contrast       Einar Pheasant, MD

## 2017-12-06 NOTE — Patient Instructions (Addendum)
  [name] , Thank you for taking time to come for your Medicare Wellness Visit. I appreciate your ongoing commitment to your health goals. Please review the following plan we discussed and let me know if I can assist you in the future.   These are the goals we discussed: Goals    . DIET - INCREASE WATER INTAKE     Stay hydrated    . Increase physical activity     Start exercising with the [employer] program Exercise 150 minutes per week (5 days, 30 minutes) Lose 50lbs       This is a list of the screening recommended for you and due dates:  Health Maintenance  Topic Date Due  . Tetanus Vaccine  03/06/1965  . Flu Shot  12/06/2017  . Mammogram  05/22/2018  . Colon Cancer Screening  06/20/2026  . DEXA scan (bone density measurement)  Completed  .  Hepatitis C: One time screening is recommended by Center for Disease Control  (CDC) for  adults born from [birth year] through 1965.   Completed  . Pneumonia vaccines  Completed

## 2017-12-07 ENCOUNTER — Telehealth: Payer: Self-pay | Admitting: Internal Medicine

## 2017-12-07 NOTE — Telephone Encounter (Signed)
-----   Message from Varney Bilesenisa L O'Brien-Blaney, LPN sent at 4/5/40988/05/2017  3:17 PM EDT ----- Regarding: NOTE Patient Concerns:  Bloating x3 weeks; feeling of fullness. She notes  her diet is poor and snacks throughout the day more than having healthy balanced meals. No change in flatulence.  Occasional constipation. Increased water and fiber intake encouraged to help with constipation. Hair loss ongoing for more than 1 year.  She has been seen by dermatology in the past. Deferred to pcp for follow up.   Notes lack of motivation when making healthy food choices for herself and with exercise.  She cooks the meals and manages the home while her adult son stays with her. Desires to lose 50lbs and plans to introduce a low carb diet plan as well as joining [employer] gym to exercise with a goal of 5 days a week, 30 minutes.

## 2017-12-07 NOTE — Telephone Encounter (Signed)
See my office note regarding follow up.

## 2017-12-09 ENCOUNTER — Encounter: Payer: Self-pay | Admitting: Internal Medicine

## 2017-12-09 DIAGNOSIS — R140 Abdominal distension (gaseous): Secondary | ICD-10-CM | POA: Insufficient documentation

## 2017-12-09 NOTE — Assessment & Plan Note (Signed)
On zoloft.  Discussed with her today.  No suicidal ideations.  Does not feel needs anything more at this time.  Follow.

## 2017-12-09 NOTE — Assessment & Plan Note (Addendum)
Did report issues with hair loss.  Discussed dermatology referral.  Follow. Desires no further intervention.

## 2017-12-09 NOTE — Assessment & Plan Note (Signed)
Low carb diet and exercise.  Follow met b and a1c.  

## 2017-12-09 NOTE — Assessment & Plan Note (Signed)
Reports the abdominal bloating and appetite change as outlined.  Feels full fast.  Persistent issue.  Will check CT abdomen and pelvis.  Have her keep food diary.  May need GI evaluation.

## 2017-12-09 NOTE — Assessment & Plan Note (Signed)
Controlled on current regimen.  Follow.  

## 2017-12-09 NOTE — Assessment & Plan Note (Signed)
Follow vitamin D level.  

## 2017-12-09 NOTE — Assessment & Plan Note (Signed)
Blood pressure under good control.  Continue same medication regimen.  Follow pressures.  Follow metabolic panel.   

## 2017-12-10 ENCOUNTER — Ambulatory Visit: Payer: Medicare HMO

## 2017-12-18 ENCOUNTER — Other Ambulatory Visit: Payer: Self-pay | Admitting: Internal Medicine

## 2017-12-18 DIAGNOSIS — R6881 Early satiety: Secondary | ICD-10-CM

## 2017-12-18 DIAGNOSIS — R140 Abdominal distension (gaseous): Secondary | ICD-10-CM

## 2017-12-18 NOTE — Progress Notes (Signed)
Order placed for GI referral.   

## 2018-01-17 ENCOUNTER — Ambulatory Visit (INDEPENDENT_AMBULATORY_CARE_PROVIDER_SITE_OTHER): Payer: Medicare HMO | Admitting: Internal Medicine

## 2018-01-17 VITALS — BP 126/72 | HR 104 | Temp 97.8°F | Resp 18 | Wt 225.6 lb

## 2018-01-17 DIAGNOSIS — I10 Essential (primary) hypertension: Secondary | ICD-10-CM | POA: Diagnosis not present

## 2018-01-17 DIAGNOSIS — F32A Depression, unspecified: Secondary | ICD-10-CM

## 2018-01-17 DIAGNOSIS — R739 Hyperglycemia, unspecified: Secondary | ICD-10-CM

## 2018-01-17 DIAGNOSIS — E559 Vitamin D deficiency, unspecified: Secondary | ICD-10-CM

## 2018-01-17 DIAGNOSIS — K219 Gastro-esophageal reflux disease without esophagitis: Secondary | ICD-10-CM

## 2018-01-17 DIAGNOSIS — Z23 Encounter for immunization: Secondary | ICD-10-CM

## 2018-01-17 DIAGNOSIS — F329 Major depressive disorder, single episode, unspecified: Secondary | ICD-10-CM | POA: Diagnosis not present

## 2018-01-17 NOTE — Progress Notes (Signed)
[name], female   DOB: February 28, 1946, 72 y.o.   MRN: [id number]   Subjective:    [name], female    DOB: 12/20/1945, 72 y.o.   MRN: [id number]  HPI  Patient here for a scheduled follow up.  She reports she is doing relatively well. Still dealing with increased stress.  Overall she feels she is handling things relatively well.  No chest pain.  No sob.  No acid reflux.  No abdominal ain or cramping.  Bowels moving.  No urine change.  Has adjusted her diet.  Cut out bread and tea.  Also decreased soft drinks and potatoes.     Past Medical History:  Diagnosis Date  . Allergy   . Depression   . History of chicken pox   . Hx: UTI (urinary tract infection)   . Hypertension    Past Surgical History:  Procedure Laterality Date  . COLONOSCOPY WITH PROPOFOL N/A 06/20/2016   Procedure: COLONOSCOPY WITH PROPOFOL;  Surgeon: Lucilla Lame, MD;  Location: [facility];  Service: Endoscopy;  Laterality: N/A;  . ESOPHAGOGASTRODUODENOSCOPY (EGD) WITH PROPOFOL N/A 06/20/2016   Procedure: ESOPHAGOGASTRODUODENOSCOPY (EGD) WITH PROPOFOL;  Surgeon: Lucilla Lame, MD;  Location: [facility];  Service: Endoscopy;  Laterality: N/A;  . TONSILLECTOMY     as a child   Family History  Problem Relation Age of Onset  . Hypertension Mother   . Cancer Sister        colon cancer  . Hypertension Sister   . Diabetes Sister   . Hypertension Brother   . Hypertension Maternal Aunt   . Breast cancer Maternal Aunt 60  . Hypertension Maternal Uncle   . Stroke Maternal Grandmother   . Hypertension Maternal Grandmother    Social History   Socioeconomic History  . Marital status: Divorced    Spouse name: Not on file  . Number of children: Not on file  . Years of education: Not on file  . Highest education level: Not on file  Occupational History  . Not on file  Social Needs  . Financial resource strain: Not hard at all  . Food insecurity:    Worry: Never true    Inability:  Never true  . Transportation needs:    Medical: No    Non-medical: No  Tobacco Use  . Smoking status: Never Smoker  . Smokeless tobacco: Never Used  Substance and Sexual Activity  . Alcohol use: No    Alcohol/week: 0.0 standard drinks  . Drug use: No  . Sexual activity: Not Currently  Lifestyle  . Physical activity:    Days per week: 0 days    Minutes per session: Not on file  . Stress: Not on file  Relationships  . Social connections:    Talks on phone: Not on file    Gets together: Not on file    Attends religious service: Not on file    Active member of club or organization: Not on file    Attends meetings of clubs or organizations: Not on file    Relationship status: Not on file  Other Topics Concern  . Not on file  Social History Narrative  . Not on file    Outpatient Encounter Medications as of 01/17/2018  Medication Sig  . esomeprazole (NEXIUM) 40 MG capsule TAKE 1 CAPSULE DAILY BEFORE BREAKFAST.  Marland Kitchen lisinopril-hydrochlorothiazide (PRINZIDE,ZESTORETIC) 10-12.5 MG tablet TAKE 1 TABLET EVERY DAY  . sertraline (ZOLOFT) 25 MG tablet TAKE 1 TABLET EVERY  DAY   No facility-administered encounter medications on file as of 01/17/2018.     Review of Systems  Constitutional: Negative for appetite change and unexpected weight change.  HENT: Negative for congestion and sinus pressure.   Respiratory: Negative for cough, chest tightness and shortness of breath.   Cardiovascular: Negative for chest pain, palpitations and leg swelling.  Gastrointestinal: Negative for abdominal pain, diarrhea, nausea and vomiting.  Genitourinary: Negative for difficulty urinating and dysuria.  Musculoskeletal: Negative for joint swelling and myalgias.  Skin: Negative for color change and rash.  Neurological: Negative for dizziness, light-headedness and headaches.  Psychiatric/Behavioral: Negative for agitation and dysphoric mood.       Objective:     Blood pressure rechecked by me:   116/68  Physical Exam  Constitutional: She appears well-developed and well-nourished. No distress.  HENT:  Nose: Nose normal.  Mouth/Throat: Oropharynx is clear and moist.  Neck: Neck supple. No thyromegaly present.  Cardiovascular: Normal rate and regular rhythm.  Pulmonary/Chest: Breath sounds normal. No respiratory distress. She has no wheezes.  Abdominal: Soft. Bowel sounds are normal. There is no tenderness.  Musculoskeletal: She exhibits no edema or tenderness.  Lymphadenopathy:    She has no cervical adenopathy.  Skin: No rash noted. No erythema.  Psychiatric: She has a normal mood and affect. Her behavior is normal.    BP 126/72 (BP Location: Left Arm, Patient Position: Sitting, Cuff Size: Large)   Pulse (!) 104   Temp 97.8 F (36.6 C) (Oral)   Resp 18   Wt 225 lb 9.6 oz (102.3 kg)   SpO2 97%   BMI 34.81 kg/m  Wt Readings from Last 3 Encounters:  01/17/18 225 lb 9.6 oz (102.3 kg)  12/06/17 224 lb (101.6 kg)  12/06/17 224 lb 12.8 oz (102 kg)     Lab Results  Component Value Date   WBC 7.9 04/27/2017   HGB 12.3 04/27/2017   HCT 36.7 04/27/2017   PLT 294 04/27/2017   GLUCOSE 123 (H) 11/29/2017   CHOL 189 11/29/2017   TRIG 114.0 11/29/2017   HDL 42.50 11/29/2017   LDLCALC 124 (H) 11/29/2017   ALT 12 11/29/2017   AST 13 11/29/2017   NA 142 11/29/2017   K 4.1 11/29/2017   CL 106 11/29/2017   CREATININE 0.93 11/29/2017   BUN 14 11/29/2017   CO2 29 11/29/2017   TSH 2.720 04/23/2017   HGBA1C 6.2 11/29/2017    Mm Screening Breast Tomo Bilateral  Result Date: 05/22/2017 CLINICAL DATA:  Screening. EXAM: 2D DIGITAL SCREENING BILATERAL MAMMOGRAM WITH 3D TOMO WITH CAD COMPARISON:  Previous exam(s). ACR Breast Density Category b: There are scattered areas of fibroglandular density. FINDINGS: There are no findings suspicious for malignancy. Images were processed with CAD. IMPRESSION: No mammographic evidence of malignancy. A result letter of this screening mammogram  will be mailed directly to the patient. RECOMMENDATION: Screening mammogram in one year. (Code:SM-B-01Y) BI-RADS CATEGORY  1: Negative. Electronically Signed   By: Margarette Canada M.D.   On: 05/22/2017 11:59       Assessment & Plan:   Problem List Items Addressed This Visit    Depression    On zoloft.  Discussed with her today.  She does not feel she needs any further intervention.  Follow.        Essential hypertension, benign    Blood pressure under good control.  Continue same medication regimen.  Follow pressures.  Follow metabolic panel.        Relevant Orders  CBC with Differential/Platelet   Hepatic function panel   TSH   Lipid panel   Basic metabolic panel   GERD (gastroesophageal reflux disease)    Controlled on nexium.        Hyperglycemia    Low carb diet and exercise.  Follow met b and a1c.       Relevant Orders   Hemoglobin A1c   Vitamin D deficiency    Follow vitamin D level.       Relevant Orders   VITAMIN D 25 Hydroxy (Vit-D Deficiency, Fractures)    Other Visit Diagnoses    Need for influenza vaccination    -  Primary   Relevant Orders   Flu vaccine HIGH DOSE PF (Fluzone High dose) (Completed)       Einar Pheasant, MD

## 2018-01-18 DIAGNOSIS — K219 Gastro-esophageal reflux disease without esophagitis: Secondary | ICD-10-CM | POA: Diagnosis not present

## 2018-01-18 DIAGNOSIS — R739 Hyperglycemia, unspecified: Secondary | ICD-10-CM | POA: Diagnosis not present

## 2018-01-18 DIAGNOSIS — E559 Vitamin D deficiency, unspecified: Secondary | ICD-10-CM | POA: Diagnosis not present

## 2018-01-18 DIAGNOSIS — F329 Major depressive disorder, single episode, unspecified: Secondary | ICD-10-CM | POA: Diagnosis not present

## 2018-01-18 DIAGNOSIS — I10 Essential (primary) hypertension: Secondary | ICD-10-CM | POA: Diagnosis not present

## 2018-01-18 DIAGNOSIS — Z23 Encounter for immunization: Secondary | ICD-10-CM | POA: Diagnosis not present

## 2018-01-20 ENCOUNTER — Encounter: Payer: Self-pay | Admitting: Internal Medicine

## 2018-01-20 NOTE — Assessment & Plan Note (Signed)
On zoloft.  Discussed with her today.  She does not feel she needs any further intervention.  Follow.

## 2018-01-20 NOTE — Assessment & Plan Note (Signed)
Controlled on nexium.  

## 2018-01-20 NOTE — Assessment & Plan Note (Signed)
Blood pressure under good control.  Continue same medication regimen.  Follow pressures.  Follow metabolic panel.   

## 2018-01-20 NOTE — Assessment & Plan Note (Signed)
Follow vitamin D level.  

## 2018-01-20 NOTE — Assessment & Plan Note (Signed)
Low carb diet and exercise.  Follow met b and a1c.  

## 2018-02-15 ENCOUNTER — Encounter: Payer: Self-pay | Admitting: Family Medicine

## 2018-02-15 ENCOUNTER — Ambulatory Visit (INDEPENDENT_AMBULATORY_CARE_PROVIDER_SITE_OTHER): Payer: Medicare HMO | Admitting: Family Medicine

## 2018-02-15 VITALS — BP 122/72 | HR 95 | Temp 98.1°F | Ht 67.0 in | Wt 225.4 lb

## 2018-02-15 DIAGNOSIS — R208 Other disturbances of skin sensation: Secondary | ICD-10-CM

## 2018-02-15 DIAGNOSIS — M79662 Pain in left lower leg: Secondary | ICD-10-CM | POA: Diagnosis not present

## 2018-02-15 LAB — CBC
HCT: 37.8 % (ref 36.0–46.0)
Hemoglobin: 12.6 g/dL (ref 12.0–15.0)
MCHC: 33.3 g/dL (ref 30.0–36.0)
MCV: 89.5 fl (ref 78.0–100.0)
PLATELETS: 263 10*3/uL (ref 150.0–400.0)
RBC: 4.22 Mil/uL (ref 3.87–5.11)
RDW: 13.4 % (ref 11.5–15.5)
WBC: 6.9 10*3/uL (ref 4.0–10.5)

## 2018-02-15 LAB — COMPREHENSIVE METABOLIC PANEL
ALT: 16 U/L (ref 0–35)
AST: 17 U/L (ref 0–37)
Albumin: 4.1 g/dL (ref 3.5–5.2)
Alkaline Phosphatase: 72 U/L (ref 39–117)
BUN: 20 mg/dL (ref 6–23)
CALCIUM: 9.3 mg/dL (ref 8.4–10.5)
CO2: 29 meq/L (ref 19–32)
CREATININE: 0.98 mg/dL (ref 0.40–1.20)
Chloride: 106 mEq/L (ref 96–112)
GFR: 59.3 mL/min — ABNORMAL LOW (ref >60.00)
Glucose, Bld: 176 mg/dL — ABNORMAL HIGH (ref 70–99)
Potassium: 3.6 mEq/L (ref 3.5–5.1)
Sodium: 142 mEq/L (ref 135–145)
Total Bilirubin: 0.3 mg/dL (ref 0.2–1.2)
Total Protein: 6.6 g/dL (ref 6.0–8.3)

## 2018-02-15 LAB — MAGNESIUM: Magnesium: 1.9 mg/dL (ref 1.5–2.5)

## 2018-02-15 MED ORDER — GABAPENTIN 100 MG PO CAPS: 100.0000 mg | ORAL_CAPSULE | Freq: Every day | ORAL | 1 refills | Status: DC

## 2018-02-15 NOTE — Progress Notes (Signed)
Subjective:    [name], female    DOB: 1945-10-03, 72 y.o.   MRN: [id number]  HPI   Patient presents to clinic complaining of a burning sensation in left lower extremity and pain in left calf.  Denies any personal medical history of blood clotting disorders or any family history of blood clotting disorders.  Denies any recent long trips in a car or on airplane.  Denies any injury to the left lower extremity.  States she notices burning sensation and pain approximately week and a half ago.  Patient Active Problem List   Diagnosis Date Noted  . Abdominal bloating 12/09/2017  . Osteopenia 04/29/2017  . Hyperglycemia 04/27/2017  . Reflux esophagitis   . Heartburn   . Special screening for malignant neoplasms, colon   . Hair loss 10/26/2015  . Fatigue 10/21/2015  . Vitamin D deficiency 03/15/2015  . Weight loss counseling, encounter for 02/22/2015  . Facial erythema 07/27/2014  . Health care maintenance 07/05/2014  . BP (high blood pressure) 03/26/2014  . Headache 03/08/2014  . Eyelid lesion 03/08/2014  . Shingles 11/16/2013  . Light headedness 06/22/2013  . Cough 05/18/2013  . GERD (gastroesophageal reflux disease) 05/18/2013  . Swelling of extremity 03/01/2013  . Left knee pain 03/01/2013  . Essential hypertension, benign 12/15/2012  . Frequent UTI 12/15/2012  . Depression 12/15/2012  . Environmental allergies 12/15/2012   Social History   Tobacco Use  . Smoking status: Never Smoker  . Smokeless tobacco: Never Used  Substance Use Topics  . Alcohol use: No    Alcohol/week: 0.0 standard drinks     Review of Systems  Constitutional: Negative for chills, fatigue and fever.  HENT: Negative for congestion, ear pain, sinus pain and sore throat.   Eyes: Negative.   Respiratory: Negative for cough, shortness of breath and wheezing.   Cardiovascular: Negative for chest pain, palpitations and leg swelling.  Gastrointestinal: Negative for abdominal pain,  diarrhea, nausea and vomiting.  Genitourinary: Negative for dysuria, frequency and urgency.  Musculoskeletal: +pain/burning in LLE  Skin: Negative for color change, pallor and rash.  Neurological: Negative for syncope, light-headedness and headaches.  Psychiatric/Behavioral: The patient is not nervous/anxious.       Objective:   Physical Exam  Constitutional:  She appears well-developed and well-nourished. No distress.  Head: Normocephalic and atraumatic.  Eyes:  EOM are normal. No scleral icterus.  Neck: Normal range of motion. Neck supple. No tracheal deviation present.  Cardiovascular: Normal rate, regular rhythm and normal heart sounds. No LE swelling Pulmonary/Chest: Effort normal and breath sounds normal. No respiratory distress. She has no wheezes. She has no rales.  Neurological: She is alert and oriented to person, place, and time.  Gait normal  Musculoskeletal: Range of motion of bilateral lower extremities intact.  No size difference between right and left calf.  No redness of skin.  No pain with palpation of left calf. Skin: Skin is warm and dry. No pallor.  Psychiatric: She has a normal mood and affect. Her behavior is normal. Thought content normal.  Nursing note and vitals reviewed.    Vitals:   02/15/18 1404  BP: 122/72  Pulse: 95  Temp: 98.1 F (36.7 C)  SpO2: 96%    Assessment & Plan:   Burning sensation of left lower extremity/pain in left calf- due to description of burning type pain, we will trial low-dose gabapentin 100 mg at bedtime.  We will also get lab work including CBC, Mg and CMP.  Patient is very concerned it could possibly be a blood clot, we will get venous Doppler to rule out DVT, but I do not strongly suspect it is DVT.  Patient advised that if she notices pain in left lower extremity increasing, calf/leg becoming swollen, red or hot to call office right away or go to ER.  Keep regularly scheduled follow-up as planned.  Return to clinic sooner if  issues arise.

## 2018-02-15 NOTE — Progress Notes (Signed)
ll

## 2018-02-18 ENCOUNTER — Encounter: Payer: Self-pay | Admitting: Family Medicine

## 2018-02-19 ENCOUNTER — Encounter: Payer: Self-pay | Admitting: Lab

## 2018-02-22 ENCOUNTER — Ambulatory Visit
Admission: RE | Admit: 2018-02-22 | Discharge: 2018-02-22 | Disposition: A | Discharge status: Home / Self Care | Payer: Medicare HMO | Source: Ambulatory Visit | Attending: Family Medicine | Admitting: Family Medicine

## 2018-02-22 DIAGNOSIS — M79662 Pain in left lower leg: Secondary | ICD-10-CM | POA: Insufficient documentation

## 2018-02-22 DIAGNOSIS — R208 Other disturbances of skin sensation: Secondary | ICD-10-CM | POA: Insufficient documentation

## 2018-02-26 ENCOUNTER — Ambulatory Visit (INDEPENDENT_AMBULATORY_CARE_PROVIDER_SITE_OTHER): Payer: Medicare HMO | Admitting: Family Medicine

## 2018-02-26 ENCOUNTER — Encounter: Payer: Self-pay | Admitting: Family Medicine

## 2018-02-26 VITALS — BP 134/86 | HR 89 | Temp 98.4°F | Ht 67.0 in | Wt 227.4 lb

## 2018-02-26 DIAGNOSIS — N3000 Acute cystitis without hematuria: Secondary | ICD-10-CM | POA: Diagnosis not present

## 2018-02-26 DIAGNOSIS — R350 Frequency of micturition: Secondary | ICD-10-CM

## 2018-02-26 DIAGNOSIS — R300 Dysuria: Secondary | ICD-10-CM | POA: Diagnosis not present

## 2018-02-26 LAB — POCT URINALYSIS DIPSTICK
Bilirubin, UA: NEGATIVE
Glucose, UA: NEGATIVE
Ketones, UA: NEGATIVE
Nitrite, UA: NEGATIVE
PH UA: 7 (ref 5.0–8.0)
PROTEIN UA: NEGATIVE
RBC UA: 10
Spec Grav, UA: 1.015 (ref 1.010–1.025)
UROBILINOGEN UA: 0.2 U/dL

## 2018-02-26 MED ORDER — CEPHALEXIN 250 MG/5ML PO SUSR: 500.0000 mg | Freq: Two times a day (BID) | ORAL | 0 refills | Status: AC

## 2018-02-26 NOTE — Progress Notes (Signed)
   Subjective:    [name], female    DOB: 1945/06/15, 72 y.o.   MRN: [id number]  HPI   Presents to clinic c/o UTI symptoms for 3-4 days. C/o burning and increased frequency of urination. Denies fever or chills. Denies nausea, vomiting or diarrhea. States years ago she would get frequent UTIs, but has not had one in 3 years.   Patient Active Problem List   Diagnosis Date Noted  . Abdominal bloating 12/09/2017  . Osteopenia 04/29/2017  . Hyperglycemia 04/27/2017  . Reflux esophagitis   . Heartburn   . Special screening for malignant neoplasms, colon   . Hair loss 10/26/2015  . Fatigue 10/21/2015  . Vitamin D deficiency 03/15/2015  . Weight loss counseling, encounter for 02/22/2015  . Facial erythema 07/27/2014  . Health care maintenance 07/05/2014  . BP (high blood pressure) 03/26/2014  . Headache 03/08/2014  . Eyelid lesion 03/08/2014  . Shingles 11/16/2013  . Light headedness 06/22/2013  . Cough 05/18/2013  . GERD (gastroesophageal reflux disease) 05/18/2013  . Swelling of extremity 03/01/2013  . Left knee pain 03/01/2013  . Essential hypertension, benign 12/15/2012  . Frequent UTI 12/15/2012  . Depression 12/15/2012  . Environmental allergies 12/15/2012   Social History   Tobacco Use  . Smoking status: Never Smoker  . Smokeless tobacco: Never Used  Substance Use Topics  . Alcohol use: No    Alcohol/week: 0.0 standard drinks   Review of Systems   Constitutional: Negative for chills, fatigue and fever.  HENT: Negative for congestion, ear pain, sinus pain and sore throat.   Eyes: Negative.   Respiratory: Negative for cough, shortness of breath and wheezing.   Cardiovascular: Negative for chest pain, palpitations and leg swelling.  Gastrointestinal: Negative for abdominal pain, diarrhea, nausea and vomiting.  Genitourinary: +dysuria, frequency and urgency.  Musculoskeletal: Negative for arthralgias and myalgias.  Skin: Negative for color change,  pallor and rash.  Neurological: Negative for syncope, light-headedness and headaches.  Psychiatric/Behavioral: The patient is not nervous/anxious.       Objective:   Physical Exam  Constitutional: She is oriented to person, place, and time. She appears well-nourished. No distress.  HENT:  Head: Normocephalic and atraumatic.  Eyes: EOM are normal. No scleral icterus.  Neck: Neck supple. No tracheal deviation present.  Cardiovascular: Normal rate and regular rhythm.  Pulmonary/Chest: Effort normal and breath sounds normal.  Abdominal: Soft. Bowel sounds are normal. There is tenderness (mild suprapubic tenderness. ).  Musculoskeletal: She exhibits no edema.  Gait normal.   Neurological: She is alert and oriented to person, place, and time.  Skin: Skin is warm and dry. No pallor.  Psychiatric: She has a normal mood and affect. Her behavior is normal.  Nursing note and vitals reviewed.     Vitals:   02/26/18 1308  BP: 134/86  Pulse: 89  Temp: 98.4 F (36.9 C)  SpO2: 98%    Assessment & Plan:   UTI, urinary frequency, dysuria - patient will take Keflex twice daily for 5 days, states she has a tough time swallowing pills and prefers liquid form.  Advised to increase fluids, do good handwashing.  Advised to avoid excess sugary and caffeinated beverages.  Urine culture collected and sent to lab, patient will be made aware of urine culture results when they are available.  Keep regular follow-up as planned with PCP, return to clinic sooner if issues arise.

## 2018-02-28 LAB — URINE CULTURE
MICRO NUMBER: 91268864
SPECIMEN QUALITY:: ADEQUATE

## 2018-03-01 MED ORDER — FOSFOMYCIN TROMETHAMINE 3 G PO PACK: 3.0000 g | PACK | Freq: Once | ORAL | 0 refills | Status: AC

## 2018-03-01 NOTE — Addendum Note (Signed)
Addended by: Leanora Cover on: 03/01/2018 03:54 PM   Modules accepted: Orders

## 2018-04-15 ENCOUNTER — Telehealth: Payer: Self-pay | Admitting: Lab

## 2018-04-15 ENCOUNTER — Other Ambulatory Visit: Payer: Self-pay | Admitting: Internal Medicine

## 2018-04-15 ENCOUNTER — Encounter: Payer: Self-pay | Admitting: Family Medicine

## 2018-04-15 ENCOUNTER — Telehealth: Payer: Self-pay | Admitting: Internal Medicine

## 2018-04-15 ENCOUNTER — Ambulatory Visit (INDEPENDENT_AMBULATORY_CARE_PROVIDER_SITE_OTHER): Payer: Medicare HMO | Admitting: Family Medicine

## 2018-04-15 VITALS — BP 136/72 | HR 95 | Temp 97.6°F | Ht 67.0 in | Wt 227.4 lb

## 2018-04-15 DIAGNOSIS — N3000 Acute cystitis without hematuria: Secondary | ICD-10-CM | POA: Diagnosis not present

## 2018-04-15 DIAGNOSIS — R350 Frequency of micturition: Secondary | ICD-10-CM | POA: Diagnosis not present

## 2018-04-15 DIAGNOSIS — K219 Gastro-esophageal reflux disease without esophagitis: Secondary | ICD-10-CM | POA: Diagnosis not present

## 2018-04-15 LAB — POCT URINALYSIS DIPSTICK
BILIRUBIN UA: NEGATIVE
Blood, UA: NEGATIVE
GLUCOSE UA: NEGATIVE
Ketones, UA: NEGATIVE
Nitrite, UA: NEGATIVE
Protein, UA: NEGATIVE
Spec Grav, UA: 1.02 (ref 1.010–1.025)
Urobilinogen, UA: 1 E.U./dL
pH, UA: 6 (ref 5.0–8.0)

## 2018-04-15 MED ORDER — CIPROFLOXACIN 500 MG/5ML (10%) PO SUSR: 500.0000 mg | Freq: Two times a day (BID) | ORAL | 0 refills | Status: AC

## 2018-04-15 MED ORDER — CIPROFLOXACIN 500 MG/5ML (10%) PO SUSR: 500.0000 mg | Freq: Two times a day (BID) | ORAL | 0 refills | Status: DC

## 2018-04-15 MED ORDER — ESOMEPRAZOLE MAGNESIUM 40 MG PO CPDR: 40.0000 mg | DELAYED_RELEASE_CAPSULE | Freq: Every day | ORAL | 11 refills | Status: DC

## 2018-04-15 NOTE — Telephone Encounter (Signed)
This was prescribed by Leotis ShamesLauren

## 2018-04-15 NOTE — Telephone Encounter (Signed)
Called Pt to tell her that her Rx was sent to [facility] in [place]. Pt stated okay with no questions

## 2018-04-15 NOTE — Telephone Encounter (Signed)
Patient stated that the [facility] doesn't have Cipro in stock.  They will have to order it.  So the patient would like to know if something else can be called in.

## 2018-04-15 NOTE — Telephone Encounter (Signed)
Online I see that [facility] in [place] is possibly a compounding pharmacy --  [phone]  Please call to see if they are and if yes, print and fax the cipro Rx that was placed in office visit earlier today

## 2018-04-15 NOTE — Telephone Encounter (Signed)
[facility] no longer exist. [facility] compounds if needs.  Let me know if I need to do anything.

## 2018-04-15 NOTE — Telephone Encounter (Signed)
Copied from CRM [phone]. Topic: Quick Communication - See Telephone Encounter >> Apr 15, 2018  3:59 PM Jens SomMedley, Jennifer A wrote: CRM for notification. See Telephone encounter for: 04/15/18.  Patient is calling regarding ciprofloxacin (CIPRO) 500 MG/5ML (10%) suspension [[id number]][[id number]] & esomeprazole (NEXIUM) 40 MG capsule [[id number]][[id number]]  Both medications were $110 for 2 prescriptions. Patient is calling for a less expensive medications. Please advise [id number] [place], [place] - [address] [place] [id number] Phone: [phone] Fax: [phone]

## 2018-04-15 NOTE — Telephone Encounter (Signed)
Patient called back and is requesting only the [facility] ,she states the reason for the cost is the Nexium is supposed to come from [facility] , and to please disregard the previous  Message per the patient

## 2018-04-15 NOTE — Progress Notes (Signed)
Subjective:    [name], female    DOB: [birth year], 72 y.o.   MRN: [id number]  HPI  Presents to clinic due to UTI symptoms for 1 day.  Patient did have a UTI back in October, was treated with antibiotics and the symptoms did improve.  Patient states currently she has suprapubic pressure, burning and increased frequency of urination.  Denies nausea or vomiting.  Denies fever or chills.  Patient also needs nexium refill -- GERD symptoms stable on this medication  Patient Active Problem List   Diagnosis Date Noted  . Abdominal bloating 12/09/2017  . Osteopenia 04/29/2017  . Hyperglycemia 04/27/2017  . Reflux esophagitis   . Heartburn   . Special screening for malignant neoplasms, colon   . Hair loss 10/26/2015  . Fatigue 10/21/2015  . Vitamin D deficiency 03/15/2015  . Weight loss counseling, encounter for 02/22/2015  . Facial erythema 07/27/2014  . Health care maintenance 07/05/2014  . BP (high blood pressure) 03/26/2014  . Headache 03/08/2014  . Eyelid lesion 03/08/2014  . Shingles 11/16/2013  . Light headedness 06/22/2013  . Cough 05/18/2013  . GERD (gastroesophageal reflux disease) 05/18/2013  . Swelling of extremity 03/01/2013  . Left knee pain 03/01/2013  . Essential hypertension, benign 12/15/2012  . Frequent UTI 12/15/2012  . Depression 12/15/2012  . Environmental allergies 12/15/2012   Social History   Tobacco Use  . Smoking status: Never Smoker  . Smokeless tobacco: Never Used  Substance Use Topics  . Alcohol use: No    Alcohol/week: 0.0 standard drinks   Review of Systems  Constitutional: Negative for chills, fatigue and fever.  HENT: Negative for congestion, ear pain, sinus pain and sore throat.   Eyes: Negative.   Respiratory: Negative for cough, shortness of breath and wheezing.   Cardiovascular: Negative for chest pain, palpitations and leg swelling.  Gastrointestinal: Negative for abdominal pain, diarrhea, nausea and vomiting.    Genitourinary: +dysuria, frequency and urgency.  Musculoskeletal: Negative for arthralgias and myalgias.  Skin: Negative for color change, pallor and rash.  Neurological: Negative for syncope, light-headedness and headaches.  Psychiatric/Behavioral: The patient is not nervous/anxious.       Objective:   Physical Exam  Constitutional: She is oriented to person, place, and time. She appears well-nourished. No distress.  HENT:  Head: Normocephalic and atraumatic.  Eyes: Conjunctivae and EOM are normal. No scleral icterus.  Neck: Neck supple. No tracheal deviation present.  Cardiovascular: Normal rate and regular rhythm.  Pulmonary/Chest: Effort normal and breath sounds normal.  Abdominal: Soft. Bowel sounds are normal. There is tenderness (mild suprapubic tenderness. ).  Musculoskeletal: She exhibits no edema.  Neurological: She is alert and oriented to person, place, and time.  Skin: Skin is warm and dry. No pallor.  Psychiatric: She has a normal mood and affect. Her behavior is normal.  Nursing note and vitals reviewed.     Vitals:   04/15/18 1307  BP: 136/72  Pulse: 95  Temp: 97.6 F (36.4 C)  SpO2: 95%   Assessment & Plan:   UTI/urinary frequency -- Patient does have leukocytes and urine sample in clinic is foul smelling.  Last urine culture does show some resistance to start antibiotics, so Cipro was chosen at this time due to it shown to be sensitive on previous urine culture.  Patient requests liquid version medicine as antibiotic pill is often hard for her to swallow.  Patient advised to increase fluid intake, avoid excess sugary and caffeinated beverages.  She will follow-up in 4 weeks for lab visit to recheck urinalysis micro to be sure bacteria is cleared.  GERD - Nexium refill given.  Patient's symptoms are stable on this medicine.  Keep regularly scheduled follow-up PCP as planned.  Return to clinic sooner if any issues arise.

## 2018-04-15 NOTE — Telephone Encounter (Signed)
Pt called back and stated that she has called all around and they no longer make  Cipro Suspension.  They told her that it would have to be compounded?  She would like to know if this is possible?    

## 2018-04-15 NOTE — Telephone Encounter (Signed)
Copied from CRM [phone]. Topic: Quick Communication - Rx Refill/Question >> Apr 15, 2018  4:26 PM Stephannie LiSimmons, Secily Walthour L, NT wrote: Medication:  esomeprazole (NEXIUM) 40 MG capsule   Has the patient contacted their pharmacy?  no: (Agent: If no, request that the patient contact the pharmacy for the refill. (Agent: If yes, when and what did the pharmacy advise?  Preferred Pharmacy (with phone number or street name [facility] [place] - [id number] [phone] (Fax)    Agent: Please be advised that RX refills may take up to 3 business days. We ask that you follow-up with your pharmacy.

## 2018-04-15 NOTE — Telephone Encounter (Signed)
Pt called back and stated that she has called all around and they no longer make  Cipro Suspension.  They told her that it would have to be compounded?  She would like to know if this is possible?

## 2018-04-16 MED ORDER — FOSFOMYCIN TROMETHAMINE 3 G PO PACK: 3.0000 g | PACK | Freq: Once | ORAL | 0 refills | Status: AC

## 2018-04-16 NOTE — Telephone Encounter (Signed)
Called Pt to tell her that the Rx was sent to her regular pharmacy, Pt stated she understood.

## 2018-04-16 NOTE — Telephone Encounter (Signed)
Fosfomycin sent in to regular pharmacy

## 2018-04-16 NOTE — Telephone Encounter (Signed)
Im confused - we sent in the cipro to the compounding pharmacy and patient said that they would have to order it  So if she wants the cipro -- the compounding pharmacy should have the Rx

## 2018-04-16 NOTE — Telephone Encounter (Signed)
I informed patient that we are waiting on urine culture. Patent has also decided due to cost that she would try a tablet & grade it up in applesauce

## 2018-04-16 NOTE — Telephone Encounter (Signed)
This patient was seen by you for UTI. Pt is requesting another abx and was routed to Dr. Lorin PicketScott. Wasn't sure if we needed to do anything with this message

## 2018-04-16 NOTE — Telephone Encounter (Signed)
Please advise 

## 2018-04-16 NOTE — Telephone Encounter (Signed)
Patient is calling to state the medication fosfomycin (MONUROL) 3 g PACK is over 200 and should like to have the ciprofloxacin (CIPRO) 500 MG/5ML (10%) suspension   called instead because it cheaper. Please advise

## 2018-04-17 LAB — URINE CULTURE
MICRO NUMBER:: 91471062
SPECIMEN QUALITY:: ADEQUATE

## 2018-04-17 MED ORDER — AMOXICILLIN-POT CLAVULANATE 600-42.9 MG/5ML PO SUSR: 7.5000 mL | Freq: Two times a day (BID) | ORAL | 0 refills | Status: AC

## 2018-04-17 MED ORDER — ESOMEPRAZOLE MAGNESIUM 40 MG PO CPDR: DELAYED_RELEASE_CAPSULE | ORAL | 1 refills | Status: DC

## 2018-04-17 NOTE — Addendum Note (Signed)
Addended by: Leanora CoverGUSE, Nayellie Sanseverino on: 04/17/2018 02:35 PM   Modules accepted: Orders

## 2018-04-17 NOTE — Telephone Encounter (Signed)
See lab result  Augmentin  Liquid sent to pharmacy

## 2018-04-17 NOTE — Telephone Encounter (Signed)
Called Pt and told her Rx was sent to the Pharmacy

## 2018-04-29 ENCOUNTER — Encounter: Payer: Self-pay | Admitting: Internal Medicine

## 2018-04-29 NOTE — Telephone Encounter (Signed)
Scheduled with lauren for 04/30/18.

## 2018-04-29 NOTE — Telephone Encounter (Signed)
If having persistent symptoms, sounds like needs to be reevaluated.

## 2018-04-30 ENCOUNTER — Encounter: Payer: Self-pay | Admitting: Family Medicine

## 2018-04-30 ENCOUNTER — Ambulatory Visit (INDEPENDENT_AMBULATORY_CARE_PROVIDER_SITE_OTHER): Payer: Medicare HMO | Admitting: Family Medicine

## 2018-04-30 VITALS — BP 126/62 | HR 98 | Temp 97.8°F | Ht 67.0 in | Wt 227.0 lb

## 2018-04-30 DIAGNOSIS — N3000 Acute cystitis without hematuria: Secondary | ICD-10-CM | POA: Diagnosis not present

## 2018-04-30 DIAGNOSIS — R350 Frequency of micturition: Secondary | ICD-10-CM | POA: Diagnosis not present

## 2018-04-30 LAB — POCT URINALYSIS DIPSTICK
Blood, UA: NEGATIVE
GLUCOSE UA: NEGATIVE
Ketones, UA: NEGATIVE
Nitrite, UA: POSITIVE
Protein, UA: POSITIVE — AB
Spec Grav, UA: >=1.03 — AB (ref 1.010–1.025)
Urobilinogen, UA: 0.2 E.U./dL
pH, UA: 6 (ref 5.0–8.0)

## 2018-04-30 MED ORDER — CEFTRIAXONE SODIUM 1 G IJ SOLR
1.0000 g | Freq: Once | INTRAMUSCULAR | Status: AC
  Administered 2018-04-30: 1 g via INTRAMUSCULAR

## 2018-04-30 MED ORDER — SULFAMETHOXAZOLE-TRIMETHOPRIM 200-40 MG/5ML PO SUSP: 20.0000 mL | Freq: Two times a day (BID) | ORAL | 0 refills | Status: DC

## 2018-04-30 NOTE — Progress Notes (Signed)
Subjective:    [name], female    DOB: 12-03-1945, 72 y.o.   MRN: [id number]  HPI  Presents to clinic due to continued UTI symptoms, was treated for UTI on 04/15/18 with augmentin course. The UTI is December was her second UTI in a 2 month time frame.   Patient states after taking Augmentin, her urinary symptoms did seem improved for a few days.  Patient states approximately 2 days ago she began to start with increased frequency, burning, pain and pressure with urination again.  Denies fever or chills.  Denies nausea, vomiting or diarrhea.  Appetite has been normal.  Patient Active Problem List   Diagnosis Date Noted  . Abdominal bloating 12/09/2017  . Osteopenia 04/29/2017  . Hyperglycemia 04/27/2017  . Reflux esophagitis   . Heartburn   . Special screening for malignant neoplasms, colon   . Hair loss 10/26/2015  . Fatigue 10/21/2015  . Vitamin D deficiency 03/15/2015  . Weight loss counseling, encounter for 02/22/2015  . Facial erythema 07/27/2014  . Health care maintenance 07/05/2014  . BP (high blood pressure) 03/26/2014  . Headache 03/08/2014  . Eyelid lesion 03/08/2014  . Shingles 11/16/2013  . Light headedness 06/22/2013  . Cough 05/18/2013  . GERD (gastroesophageal reflux disease) 05/18/2013  . Swelling of extremity 03/01/2013  . Left knee pain 03/01/2013  . Essential hypertension, benign 12/15/2012  . Frequent UTI 12/15/2012  . Depression 12/15/2012  . Environmental allergies 12/15/2012   Social History   Tobacco Use  . Smoking status: Never Smoker  . Smokeless tobacco: Never Used  Substance Use Topics  . Alcohol use: No    Alcohol/week: 0.0 standard drinks     Review of Systems  Constitutional: Negative for chills, fatigue and fever.  HENT: Negative for congestion, ear pain, sinus pain and sore throat.   Eyes: Negative.   Respiratory: Negative for cough, shortness of breath and wheezing.   Cardiovascular: Negative for chest pain,  palpitations and leg swelling.  Gastrointestinal: Negative for abdominal pain, diarrhea, nausea and vomiting.  Genitourinary: +dysuria, frequency and urgency.  Musculoskeletal: Negative for arthralgias and myalgias.  Skin: Negative for color change, pallor and rash.  Neurological: Negative for syncope, light-headedness and headaches.  Psychiatric/Behavioral: The patient is not nervous/anxious.       Objective:   Physical Exam  Constitutional: She appears well-developed and well-nourished. No distress.  HENT:  Head: Normocephalic and atraumatic.  Eyes: EOM are normal. No scleral icterus.  Neck: Normal range of motion. Neck supple. No tracheal deviation present.  Cardiovascular: Normal rate, regular rhythm and normal heart sounds.  Pulmonary/Chest: Effort normal and breath sounds normal. No respiratory distress. She has no wheezes. She has no rales.  Abdominal: Soft. Bowel sounds are normal. +left sided suprapubic tenderness Neurological: She is alert and oriented to person, place, and time.  Gait normal  Skin: Skin is warm and dry. No pallor.  Psychiatric: She has a normal mood and affect. Her behavior is normal. Thought content normal.   Nursing note and vitals reviewed.      Vitals:   04/30/18 0951  BP: 126/62  Pulse: 98  Temp: 97.8 F (36.6 C)  SpO2: 95%   Assessment & Plan:   Urinary tract infection/urinary frequency - patient has positive leukocytes and nitrites in urine, we will treat with 1 g Rocephin IM x1 in clinic and she will take course of Bactrim twice daily x7 days.  Due to this being patient's third UTI and about  10 weeks, I discussed with patient that she most likely will need urology referral.  Patient and I agreed that if she has another UTI after this occurrence, she will see urology for evaluation.  Patient advised to increase fluid intake, avoid excess sugary and caffeinated beverages, wear cotton underwear, wipe front to back.  Administrations This Visit     cefTRIAXone (ROCEPHIN) injection 1 g    Admin Date 04/30/2018 Action Given Dose 1 g Route Intramuscular Administered By Clearnce SorrelPickard, Jill S, RMA         Patient will keep regularly scheduled follow-up with PCP as planned.  Advised return to clinic sooner if any issues arise.

## 2018-05-02 LAB — URINE CULTURE
MICRO NUMBER:: 91538766
SPECIMEN QUALITY:: ADEQUATE

## 2018-05-03 ENCOUNTER — Telehealth: Payer: Self-pay | Admitting: *Deleted

## 2018-05-03 DIAGNOSIS — N390 Urinary tract infection, site not specified: Secondary | ICD-10-CM

## 2018-05-03 MED ORDER — NITROFURANTOIN MONOHYD MACRO 100 MG PO CAPS: 100.0000 mg | ORAL_CAPSULE | Freq: Two times a day (BID) | ORAL | 0 refills | Status: DC

## 2018-05-03 NOTE — Telephone Encounter (Signed)
Please advise 

## 2018-05-03 NOTE — Telephone Encounter (Signed)
I sent in macrobid Please notify patient She needs to stop bactrim

## 2018-05-03 NOTE — Telephone Encounter (Signed)
Patient notified

## 2018-05-03 NOTE — Telephone Encounter (Signed)
**  Patient is calling- she did get message- but is not sure how to respond in MyChart- she wants PCP to know that she would be willing to try the new antibiotic- please send to pharmacy.  Message: Your urine culture has returned and it does show a urinary tract infection.   Unfortunately, your infection is resistant to Bactrim which originally sent in for you. I would like for you to stop this antibiotic.  Nitrofurantoin would be a good choice based on your urine culture. I see that you have been nauseous on this in the past. Would you be willing to try this medication again and take with food?   Ensure to take probiotics while on antibiotics and also for 2 weeks after completion. It is important to re-colonize the gut with good bacteria and also to prevent any diarrheal infections associated with antibiotic use.   If you continue to have symptoms after treatment , please let me know.   Rory PercyBest,   Margaret, NP

## 2018-05-03 NOTE — Addendum Note (Signed)
Addended by: Allegra GranaARNETT, Jacari Kirsten G on: 05/03/2018 02:41 PM   Modules accepted: Orders

## 2018-05-13 ENCOUNTER — Other Ambulatory Visit (INDEPENDENT_AMBULATORY_CARE_PROVIDER_SITE_OTHER): Payer: Medicare HMO

## 2018-05-13 DIAGNOSIS — I10 Essential (primary) hypertension: Secondary | ICD-10-CM

## 2018-05-13 DIAGNOSIS — R739 Hyperglycemia, unspecified: Secondary | ICD-10-CM | POA: Diagnosis not present

## 2018-05-13 DIAGNOSIS — N3000 Acute cystitis without hematuria: Secondary | ICD-10-CM | POA: Diagnosis not present

## 2018-05-13 DIAGNOSIS — E559 Vitamin D deficiency, unspecified: Secondary | ICD-10-CM

## 2018-05-13 LAB — CBC WITH DIFFERENTIAL/PLATELET
Basophils Absolute: 0 10*3/uL (ref 0.0–0.1)
Basophils Relative: 0.3 % (ref 0.0–3.0)
EOS ABS: 0.2 10*3/uL (ref 0.0–0.7)
Eosinophils Relative: 2.5 % (ref 0.0–5.0)
HEMATOCRIT: 37 % (ref 36.0–46.0)
Hemoglobin: 12.3 g/dL (ref 12.0–15.0)
Lymphocytes Relative: 45.7 % (ref 12.0–46.0)
Lymphs Abs: 3.2 10*3/uL (ref 0.7–4.0)
MCHC: 33.3 g/dL (ref 30.0–36.0)
MCV: 90.2 fl (ref 78.0–100.0)
Monocytes Absolute: 0.6 10*3/uL (ref 0.1–1.0)
Monocytes Relative: 8.2 % (ref 3.0–12.0)
Neutro Abs: 3.1 10*3/uL (ref 1.4–7.7)
Neutrophils Relative %: 43.3 % (ref 43.0–77.0)
Platelets: 256 10*3/uL (ref 150.0–400.0)
RBC: 4.1 Mil/uL (ref 3.87–5.11)
RDW: 13.4 % (ref 11.5–15.5)
WBC: 7.1 10*3/uL (ref 4.0–10.5)

## 2018-05-13 LAB — HEPATIC FUNCTION PANEL
ALT: 12 U/L (ref 0–35)
AST: 14 U/L (ref 0–37)
Albumin: 4 g/dL (ref 3.5–5.2)
Alkaline Phosphatase: 75 U/L (ref 39–117)
Bilirubin, Direct: 0 mg/dL (ref 0.0–0.3)
Total Bilirubin: 0.3 mg/dL (ref 0.2–1.2)
Total Protein: 6.8 g/dL (ref 6.0–8.3)

## 2018-05-13 LAB — BASIC METABOLIC PANEL
BUN: 17 mg/dL (ref 6–23)
CO2: 30 mEq/L (ref 19–32)
Calcium: 9.7 mg/dL (ref 8.4–10.5)
Chloride: 105 mEq/L (ref 96–112)
Creatinine, Ser: 0.88 mg/dL (ref 0.40–1.20)
GFR: 67.1 mL/min (ref >60.00)
Glucose, Bld: 111 mg/dL — ABNORMAL HIGH (ref 70–99)
Potassium: 4.2 mEq/L (ref 3.5–5.1)
Sodium: 142 mEq/L (ref 135–145)

## 2018-05-13 LAB — LIPID PANEL
CHOLESTEROL: 176 mg/dL (ref 0–200)
HDL: 41 mg/dL (ref >39.00)
LDL Cholesterol: 116 mg/dL — ABNORMAL HIGH (ref 0–99)
NONHDL: 135
Total CHOL/HDL Ratio: 4
Triglycerides: 93 mg/dL (ref 0.0–149.0)
VLDL: 18.6 mg/dL (ref 0.0–40.0)

## 2018-05-13 LAB — HEMOGLOBIN A1C: Hgb A1c MFr Bld: 6.1 % (ref 4.6–6.5)

## 2018-05-13 LAB — VITAMIN D 25 HYDROXY (VIT D DEFICIENCY, FRACTURES): VITD: 24.26 ng/mL — AB (ref 30.00–100.00)

## 2018-05-13 LAB — TSH: TSH: 3.71 u[IU]/mL (ref 0.35–4.50)

## 2018-05-13 NOTE — Addendum Note (Signed)
Addended by: Penne Lash on: 05/13/2018 08:07 AM   Modules accepted: Orders

## 2018-05-14 ENCOUNTER — Ambulatory Visit (INDEPENDENT_AMBULATORY_CARE_PROVIDER_SITE_OTHER): Payer: Medicare HMO | Admitting: Internal Medicine

## 2018-05-14 ENCOUNTER — Encounter: Payer: Self-pay | Admitting: Internal Medicine

## 2018-05-14 VITALS — BP 118/72 | HR 103 | Temp 98.0°F | Ht 68.0 in | Wt 227.2 lb

## 2018-05-14 DIAGNOSIS — N390 Urinary tract infection, site not specified: Secondary | ICD-10-CM

## 2018-05-14 DIAGNOSIS — R739 Hyperglycemia, unspecified: Secondary | ICD-10-CM | POA: Diagnosis not present

## 2018-05-14 DIAGNOSIS — E559 Vitamin D deficiency, unspecified: Secondary | ICD-10-CM

## 2018-05-14 DIAGNOSIS — R05 Cough: Secondary | ICD-10-CM

## 2018-05-14 DIAGNOSIS — F329 Major depressive disorder, single episode, unspecified: Secondary | ICD-10-CM | POA: Diagnosis not present

## 2018-05-14 DIAGNOSIS — K219 Gastro-esophageal reflux disease without esophagitis: Secondary | ICD-10-CM | POA: Diagnosis not present

## 2018-05-14 DIAGNOSIS — Z0000 Encounter for general adult medical examination without abnormal findings: Secondary | ICD-10-CM | POA: Diagnosis not present

## 2018-05-14 DIAGNOSIS — E7800 Pure hypercholesterolemia, unspecified: Secondary | ICD-10-CM | POA: Diagnosis not present

## 2018-05-14 DIAGNOSIS — Z1231 Encounter for screening mammogram for malignant neoplasm of breast: Secondary | ICD-10-CM

## 2018-05-14 DIAGNOSIS — F32A Depression, unspecified: Secondary | ICD-10-CM

## 2018-05-14 DIAGNOSIS — R059 Cough, unspecified: Secondary | ICD-10-CM

## 2018-05-14 DIAGNOSIS — I10 Essential (primary) hypertension: Secondary | ICD-10-CM | POA: Diagnosis not present

## 2018-05-14 DIAGNOSIS — M8580 Other specified disorders of bone density and structure, unspecified site: Secondary | ICD-10-CM

## 2018-05-14 NOTE — Patient Instructions (Signed)
Saline nasal spray - flush nose at least 2-3x/day ? ?Nasacort nasal spray 2 sprays each nostril one time per day.  Do this in the evening.   ? ?Robitussin DM  twice a day as needed for cough and congestion.  ?

## 2018-05-14 NOTE — Progress Notes (Signed)
[name], female   DOB: [birth year], 73 y.o.   MRN: [id number]   Subjective:    [name], female    DOB: Oct 09, 1945, 73 y.o.   MRN: [id number]  HPI  Patient here for her physical exam.  Has been seen recently for recurring urinary tract infections.  States she is asymptomatic now.  No urinary symptoms currently.  Discussed urology referral.  Has started probiotics and cranberry chews.  Wants to hold on further w/up currently.  Trying to stay active.  No chest pain.  No sob.  No acid reflux.  No abdominal pain.  Bowels moving.  Discussed cholesterol results and calculated cholesterol risk.  She declines.  Reports some nasal stuffiness and sore throat.  Some cough.  No sob.  No chest congestion.  No fever.     Past Medical History:  Diagnosis Date  . Allergy   . Depression   . History of chicken pox   . Hx: UTI (urinary tract infection)   . Hypertension    Past Surgical History:  Procedure Laterality Date  . COLONOSCOPY WITH PROPOFOL N/A 06/20/2016   Procedure: COLONOSCOPY WITH PROPOFOL;  Surgeon: Lucilla Lame, MD;  Location: [facility];  Service: Endoscopy;  Laterality: N/A;  . ESOPHAGOGASTRODUODENOSCOPY (EGD) WITH PROPOFOL N/A 06/20/2016   Procedure: ESOPHAGOGASTRODUODENOSCOPY (EGD) WITH PROPOFOL;  Surgeon: Lucilla Lame, MD;  Location: [facility]OSCOPY;  Service: Endoscopy;  Laterality: N/A;  . TONSILLECTOMY     as a child   Family History  Problem Relation Age of Onset  . Hypertension Mother   . Cancer Sister        colon cancer  . Hypertension Sister   . Diabetes Sister   . Hypertension Brother   . Hypertension Maternal Aunt   . Breast cancer Maternal Aunt 60  . Hypertension Maternal Uncle   . Stroke Maternal Grandmother   . Hypertension Maternal Grandmother    Social History   Socioeconomic History  . Marital status: Divorced    Spouse name: Not on file  . Number of children: Not on file  . Years of education: Not on file  . Highest  education level: Not on file  Occupational History  . Not on file  Social Needs  . Financial resource strain: Not hard at all  . Food insecurity:    Worry: Never true    Inability: Never true  . Transportation needs:    Medical: No    Non-medical: No  Tobacco Use  . Smoking status: Never Smoker  . Smokeless tobacco: Never Used  Substance and Sexual Activity  . Alcohol use: No    Alcohol/week: 0.0 standard drinks  . Drug use: No  . Sexual activity: Not Currently  Lifestyle  . Physical activity:    Days per week: 0 days    Minutes per session: Not on file  . Stress: Not on file  Relationships  . Social connections:    Talks on phone: Not on file    Gets together: Not on file    Attends religious service: Not on file    Active member of club or organization: Not on file    Attends meetings of clubs or organizations: Not on file    Relationship status: Not on file  Other Topics Concern  . Not on file  Social History Narrative  . Not on file    Outpatient Encounter Medications as of 05/14/2018  Medication Sig  . esomeprazole (NEXIUM) 40 MG capsule  Take 1 capsule (40 mg total) by mouth daily before breakfast.  . [DISCONTINUED] esomeprazole (NEXIUM) 40 MG capsule TAKE 1 CAPSULE DAILY BEFORE BREAKFAST.  . [DISCONTINUED] lisinopril-hydrochlorothiazide (PRINZIDE,ZESTORETIC) 10-12.5 MG tablet TAKE 1 TABLET EVERY DAY  . [DISCONTINUED] sertraline (ZOLOFT) 25 MG tablet TAKE 1 TABLET EVERY DAY  . gabapentin (NEURONTIN) 100 MG capsule Take 1 capsule (100 mg total) by mouth at bedtime. (Patient not taking: Reported on 05/14/2018)  . [DISCONTINUED] nitrofurantoin, macrocrystal-monohydrate, (MACROBID) 100 MG capsule Take 1 capsule (100 mg total) by mouth 2 (two) times daily. Take with food. (Patient not taking: Reported on 05/14/2018)   No facility-administered encounter medications on file as of 05/14/2018.     Review of Systems  Constitutional: Negative for appetite change and unexpected  weight change.  HENT: Positive for congestion, postnasal drip and sore throat.   Eyes: Negative for pain and visual disturbance.  Respiratory: Positive for cough. Negative for chest tightness and shortness of breath.   Cardiovascular: Negative for chest pain, palpitations and leg swelling.  Gastrointestinal: Negative for abdominal pain, diarrhea, nausea and vomiting.  Genitourinary: Negative for difficulty urinating and dysuria.  Musculoskeletal: Negative for joint swelling and myalgias.  Skin: Negative for color change and rash.  Neurological: Negative for dizziness, light-headedness and headaches.  Hematological: Negative for adenopathy. Does not bruise/bleed easily.  Psychiatric/Behavioral: Negative for agitation and dysphoric mood.       Objective:    Physical Exam Constitutional:      General: She is not in acute distress.    Appearance: Normal appearance. She is well-developed.  HENT:     Nose: Nose normal. No congestion.     Mouth/Throat:     Pharynx: No oropharyngeal exudate or posterior oropharyngeal erythema.  Eyes:     General: No scleral icterus.       Right eye: No discharge.        Left eye: No discharge.  Neck:     Musculoskeletal: Neck supple. No muscular tenderness.     Thyroid: No thyromegaly.  Cardiovascular:     Rate and Rhythm: Normal rate and regular rhythm.  Pulmonary:     Effort: No tachypnea, accessory muscle usage or respiratory distress.     Breath sounds: Normal breath sounds. No decreased breath sounds, wheezing or rhonchi.  Chest:     Breasts:        Right: No inverted nipple, mass, nipple discharge or tenderness (no axillary adenopathy).        Left: No inverted nipple, mass, nipple discharge or tenderness (no axilarry adenopathy).  Abdominal:     General: Bowel sounds are normal.     Palpations: Abdomen is soft.     Tenderness: There is no abdominal tenderness.  Musculoskeletal:        General: No swelling or tenderness.  Lymphadenopathy:      Cervical: No cervical adenopathy.  Skin:    Findings: No erythema or rash.  Neurological:     Mental Status: She is alert and oriented to person, place, and time.  Psychiatric:        Mood and Affect: Mood normal.        Behavior: Behavior normal.     BP 118/72 (BP Location: Left Arm, Patient Position: Sitting, Cuff Size: Large)   Pulse (!) 103   Temp 98 F (36.7 C)   Ht '5\' 8"'  (1.727 m)   Wt 227 lb 3.2 oz (103.1 kg)   SpO2 96%   BMI 34.55 kg/m  Wt Readings from  Last 3 Encounters:  05/14/18 227 lb 3.2 oz (103.1 kg)  04/30/18 227 lb (103 kg)  04/15/18 227 lb 6.4 oz (103.1 kg)     Lab Results  Component Value Date   WBC 7.1 05/13/2018   HGB 12.3 05/13/2018   HCT 37.0 05/13/2018   PLT 256.0 05/13/2018   GLUCOSE 111 (H) 05/13/2018   CHOL 176 05/13/2018   TRIG 93.0 05/13/2018   HDL 41.00 05/13/2018   LDLCALC 116 (H) 05/13/2018   ALT 12 05/13/2018   AST 14 05/13/2018   NA 142 05/13/2018   K 4.2 05/13/2018   CL 105 05/13/2018   CREATININE 0.88 05/13/2018   BUN 17 05/13/2018   CO2 30 05/13/2018   TSH 3.71 05/13/2018   HGBA1C 6.1 05/13/2018    [place] Venous Img Lower Unilateral Left  Result Date: 02/22/2018 CLINICAL DATA:  73 year old female with a history of pain in the left calf EXAM: LEFT LOWER EXTREMITY VENOUS DOPPLER ULTRASOUND TECHNIQUE: Gray-scale sonography with graded compression, as well as color Doppler and duplex ultrasound were performed to evaluate the lower extremity deep venous systems from the level of the common femoral vein and including the common femoral, femoral, profunda femoral, popliteal and calf veins including the posterior tibial, peroneal and gastrocnemius veins when visible. The superficial great saphenous vein was also interrogated. Spectral Doppler was utilized to evaluate flow at rest and with distal augmentation maneuvers in the common femoral, femoral and popliteal veins. COMPARISON:  None. FINDINGS: Contralateral Common Femoral Vein:  Respiratory phasicity is normal and symmetric with the symptomatic side. No evidence of thrombus. Normal compressibility. Common Femoral Vein: No evidence of thrombus. Normal compressibility, respiratory phasicity and response to augmentation. Saphenofemoral Junction: No evidence of thrombus. Normal compressibility and flow on color Doppler imaging. Profunda Femoral Vein: No evidence of thrombus. Normal compressibility and flow on color Doppler imaging. Femoral Vein: No evidence of thrombus. Normal compressibility, respiratory phasicity and response to augmentation. Popliteal Vein: No evidence of thrombus. Normal compressibility, respiratory phasicity and response to augmentation. Calf Veins: No evidence of thrombus. Normal compressibility and flow on color Doppler imaging. Superficial Great Saphenous Vein: No evidence of thrombus. Normal compressibility and flow on color Doppler imaging. Other Findings:  None. IMPRESSION: Sonographic survey of the left lower extremity negative for DVT Electronically Signed   By: Corrie Mckusick D.O.   On: 02/22/2018 15:44       Assessment & Plan:   Problem List Items Addressed This Visit    Cough    Some nasal stuffiness, drainage and minimal cough.  Saline nasal spray, nasacort nasal spray and robitussin DM as directed.  Follow.        Depression    On zoloft.  Doing well.  Follow.        Essential hypertension, benign    Blood pressure under good control.  Continue same medication regimen.  Follow pressures.  Follow metabolic panel.        Relevant Orders   Basic metabolic panel   Frequent UTI    No symptoms now.  Discussed with her today.  Discussed referral to urology.  She declines.  Wants to follow.        GERD (gastroesophageal reflux disease)    Controlled on current regimen.  Follow.        Health care maintenance    Physical today 05/14/18.  Mammogram 05/22/17-Birads I. Schedule f/u mammogram.  Colonoscopy 06/20/16 - internal hemorrhoids.   Recommended f/u in 10 years.  Hyperglycemia    Low carb diet and exercise.  Follow met b and a1c.        Relevant Orders   Hemoglobin A1c   Osteopenia    Calcium and vitamin D.  Weight bearing exercise.  Wants to hold on follow up bone density.        Vitamin D deficiency    Follow vitamin D level.         Other Visit Diagnoses    Routine general medical examination at a health care facility    -  Primary   Visit for screening mammogram       Relevant Orders   MM 3D SCREEN BREAST BILATERAL   Hypercholesterolemia       Relevant Orders   Hepatic function panel   Lipid panel       Einar Pheasant, MD

## 2018-05-15 ENCOUNTER — Other Ambulatory Visit: Payer: Self-pay | Admitting: Internal Medicine

## 2018-05-18 ENCOUNTER — Encounter: Payer: Self-pay | Admitting: Internal Medicine

## 2018-05-18 NOTE — Assessment & Plan Note (Signed)
Low carb diet and exercise.  Follow met b and a1c.   

## 2018-05-18 NOTE — Assessment & Plan Note (Signed)
Calcium and vitamin D.  Weight bearing exercise.  Wants to hold on follow up bone density.

## 2018-05-18 NOTE — Assessment & Plan Note (Signed)
Some nasal stuffiness, drainage and minimal cough.  Saline nasal spray, nasacort nasal spray and robitussin DM as directed.  Follow.

## 2018-05-18 NOTE — Assessment & Plan Note (Signed)
On zoloft.  Doing well.  Follow.  

## 2018-05-18 NOTE — Assessment & Plan Note (Signed)
Blood pressure under good control.  Continue same medication regimen.  Follow pressures.  Follow metabolic panel.   

## 2018-05-18 NOTE — Assessment & Plan Note (Signed)
No symptoms now.  Discussed with her today.  Discussed referral to urology.  She declines.  Wants to follow.

## 2018-05-18 NOTE — Assessment & Plan Note (Signed)
Follow vitamin D level.  

## 2018-05-18 NOTE — Assessment & Plan Note (Signed)
Physical today 05/14/18.  Mammogram 05/22/17-Birads I. Schedule f/u mammogram.  Colonoscopy 06/20/16 - internal hemorrhoids.  Recommended f/u in 10 years.

## 2018-05-18 NOTE — Assessment & Plan Note (Signed)
Controlled on current regimen.  Follow.  

## 2018-05-19 DIAGNOSIS — J40 Bronchitis, not specified as acute or chronic: Secondary | ICD-10-CM | POA: Diagnosis not present

## 2018-05-26 ENCOUNTER — Encounter: Payer: Self-pay | Admitting: Internal Medicine

## 2018-05-27 ENCOUNTER — Ambulatory Visit (INDEPENDENT_AMBULATORY_CARE_PROVIDER_SITE_OTHER): Payer: Medicare HMO | Admitting: Internal Medicine

## 2018-05-27 ENCOUNTER — Encounter: Payer: Self-pay | Admitting: Internal Medicine

## 2018-05-27 ENCOUNTER — Ambulatory Visit (INDEPENDENT_AMBULATORY_CARE_PROVIDER_SITE_OTHER): Payer: Medicare HMO

## 2018-05-27 VITALS — BP 130/78 | HR 80 | Temp 98.1°F | Resp 18 | Wt 225.4 lb

## 2018-05-27 DIAGNOSIS — R05 Cough: Secondary | ICD-10-CM

## 2018-05-27 DIAGNOSIS — R062 Wheezing: Secondary | ICD-10-CM

## 2018-05-27 DIAGNOSIS — I10 Essential (primary) hypertension: Secondary | ICD-10-CM | POA: Diagnosis not present

## 2018-05-27 DIAGNOSIS — R059 Cough, unspecified: Secondary | ICD-10-CM

## 2018-05-27 MED ORDER — FLUTICASONE PROPIONATE HFA 110 MCG/ACT IN AERO: 2.0000 | INHALATION_SPRAY | Freq: Two times a day (BID) | RESPIRATORY_TRACT | 0 refills | Status: DC

## 2018-05-27 MED ORDER — DOXYCYCLINE HYCLATE 100 MG PO TABS: 100.0000 mg | ORAL_TABLET | Freq: Two times a day (BID) | ORAL | 0 refills | Status: DC

## 2018-05-27 MED ORDER — ALBUTEROL SULFATE HFA 108 (90 BASE) MCG/ACT IN AERS: 2.0000 | INHALATION_SPRAY | Freq: Four times a day (QID) | RESPIRATORY_TRACT | 0 refills | Status: DC | PRN

## 2018-05-27 NOTE — Progress Notes (Signed)
Patient ID: GAYLYN MONTGOMERY, female   DOB: [birth year], 73 y.o.   MRN: [id number]   Subjective:    Patient ID: [name], female    DOB: 1946-01-18, 73 y.o.   MRN: [id number]  HPI  Patient here as a work in with concerns regarding increased cough and congestion.  Was seen at acute care 05/19/18 and diagnosed with bronchitis.  Treated with doxycycline.  Also given albuterol inhaler.  Comes in today with concerns regarding persistent cough and congestion.  Does feel better, but concerned regarding wheezing, congestion and persistent cough.  Some sinus pressure.  No acid reflux.  Takes nexium.  No abdominal pain.  Bowels moving.  No sob.  Eating.  Tolerating abx.     Past Medical History:  Diagnosis Date  . Allergy   . Depression   . History of chicken pox   . Hx: UTI (urinary tract infection)   . Hypertension    Past Surgical History:  Procedure Laterality Date  . COLONOSCOPY WITH PROPOFOL N/A 06/20/2016   Procedure: COLONOSCOPY WITH PROPOFOL;  Surgeon: Midge Minium, MD;  Location: [facility];  Service: Endoscopy;  Laterality: N/A;  . ESOPHAGOGASTRODUODENOSCOPY (EGD) WITH PROPOFOL N/A 06/20/2016   Procedure: ESOPHAGOGASTRODUODENOSCOPY (EGD) WITH PROPOFOL;  Surgeon: Midge Minium, MD;  Location: [facility];  Service: Endoscopy;  Laterality: N/A;  . TONSILLECTOMY     as a child   Family History  Problem Relation Age of Onset  . Hypertension Mother   . Cancer Sister        colon cancer  . Hypertension Sister   . Diabetes Sister   . Hypertension Brother   . Hypertension Maternal Aunt   . Breast cancer Maternal Aunt 60  . Hypertension Maternal Uncle   . Stroke Maternal Grandmother   . Hypertension Maternal Grandmother    Social History   Socioeconomic History  . Marital status: Divorced    Spouse name: Not on file  . Number of children: Not on file  . Years of education: Not on file  . Highest education level: Not on file  Occupational History  . Not on file  Social  Needs  . Financial resource strain: Not hard at all  . Food insecurity:    Worry: Never true    Inability: Never true  . Transportation needs:    Medical: No    Non-medical: No  Tobacco Use  . Smoking status: Never Smoker  . Smokeless tobacco: Never Used  Substance and Sexual Activity  . Alcohol use: No    Alcohol/week: 0.0 standard drinks  . Drug use: No  . Sexual activity: Not Currently  Lifestyle  . Physical activity:    Days per week: 0 days    Minutes per session: Not on file  . Stress: Not on file  Relationships  . Social connections:    Talks on phone: Not on file    Gets together: Not on file    Attends religious service: Not on file    Active member of club or organization: Not on file    Attends meetings of clubs or organizations: Not on file    Relationship status: Not on file  Other Topics Concern  . Not on file  Social History Narrative  . Not on file    Outpatient Encounter Medications as of 05/27/2018  Medication Sig  . albuterol (PROVENTIL HFA;VENTOLIN HFA) 108 (90 Base) MCG/ACT inhaler Inhale 2 puffs into the lungs every 6 (six) hours as needed for wheezing or  shortness of breath.  . doxycycline (VIBRA-TABS) 100 MG tablet Take 1 tablet (100 mg total) by mouth 2 (two) times daily.  Marland Kitchen. esomeprazole (NEXIUM) 40 MG capsule Take 1 capsule (40 mg total) by mouth daily before breakfast.  . fluticasone (FLOVENT HFA) 110 MCG/ACT inhaler Inhale 2 puffs into the lungs 2 (two) times daily. Rinse mouth after use  . gabapentin (NEURONTIN) 100 MG capsule Take 1 capsule (100 mg total) by mouth at bedtime. (Patient not taking: Reported on 05/14/2018)  . lisinopril-hydrochlorothiazide (PRINZIDE,ZESTORETIC) 10-12.5 MG tablet TAKE 1 TABLET EVERY DAY  . sertraline (ZOLOFT) 25 MG tablet TAKE 1 TABLET EVERY DAY   No facility-administered encounter medications on file as of 05/27/2018.     Review of Systems  Constitutional: Negative for appetite change and fever.  HENT: Positive  for congestion and sinus pressure.   Respiratory: Positive for cough and wheezing. Negative for chest tightness and shortness of breath.   Cardiovascular: Negative for chest pain and leg swelling.  Gastrointestinal: Negative for abdominal pain, diarrhea, nausea and vomiting.  Musculoskeletal: Negative for joint swelling and myalgias.  Skin: Negative for color change and rash.  Neurological: Negative for dizziness, light-headedness and headaches.  Psychiatric/Behavioral: Negative for agitation and dysphoric mood.       Objective:    Physical Exam Constitutional:      General: She is not in acute distress.    Appearance: Normal appearance.  HENT:     Nose: Nose normal. No congestion.     Mouth/Throat:     Pharynx: No oropharyngeal exudate or posterior oropharyngeal erythema.  Neck:     Musculoskeletal: Neck supple. No muscular tenderness.     Thyroid: No thyromegaly.  Cardiovascular:     Rate and Rhythm: Normal rate and regular rhythm.  Pulmonary:     Effort: No respiratory distress.     Breath sounds: Normal breath sounds. No wheezing.  Abdominal:     General: Bowel sounds are normal.     Palpations: Abdomen is soft.     Tenderness: There is no abdominal tenderness.  Musculoskeletal:        General: No swelling or tenderness.  Lymphadenopathy:     Cervical: No cervical adenopathy.  Skin:    Findings: No erythema or rash.  Neurological:     Mental Status: She is alert.  Psychiatric:        Mood and Affect: Mood normal.        Behavior: Behavior normal.     BP 130/78 (BP Location: Left Arm, Patient Position: Sitting, Cuff Size: Normal)   Pulse 80   Temp 98.1 F (36.7 C) (Oral)   Resp 18   Wt 225 lb 6.4 oz (102.2 kg)   SpO2 97%   BMI 34.27 kg/m  Wt Readings from Last 3 Encounters:  05/27/18 225 lb 6.4 oz (102.2 kg)  05/14/18 227 lb 3.2 oz (103.1 kg)  04/30/18 227 lb (103 kg)     Lab Results  Component Value Date   WBC 7.1 05/13/2018   HGB 12.3 05/13/2018     HCT 37.0 05/13/2018   PLT 256.0 05/13/2018   GLUCOSE 111 (H) 05/13/2018   CHOL 176 05/13/2018   TRIG 93.0 05/13/2018   HDL 41.00 05/13/2018   LDLCALC 116 (H) 05/13/2018   ALT 12 05/13/2018   AST 14 05/13/2018   NA 142 05/13/2018   K 4.2 05/13/2018   CL 105 05/13/2018   CREATININE 0.88 05/13/2018   BUN 17 05/13/2018   CO2 30  05/13/2018   TSH 3.71 05/13/2018   HGBA1C 6.1 05/13/2018    [place] Venous Img Lower Unilateral Left  Result Date: 02/22/2018 CLINICAL DATA:  73 year old female with a history of pain in the left calf EXAM: LEFT LOWER EXTREMITY VENOUS DOPPLER ULTRASOUND TECHNIQUE: Gray-scale sonography with graded compression, as well as color Doppler and duplex ultrasound were performed to evaluate the lower extremity deep venous systems from the level of the common femoral vein and including the common femoral, femoral, profunda femoral, popliteal and calf veins including the posterior tibial, peroneal and gastrocnemius veins when visible. The superficial great saphenous vein was also interrogated. Spectral Doppler was utilized to evaluate flow at rest and with distal augmentation maneuvers in the common femoral, femoral and popliteal veins. COMPARISON:  None. FINDINGS: Contralateral Common Femoral Vein: Respiratory phasicity is normal and symmetric with the symptomatic side. No evidence of thrombus. Normal compressibility. Common Femoral Vein: No evidence of thrombus. Normal compressibility, respiratory phasicity and response to augmentation. Saphenofemoral Junction: No evidence of thrombus. Normal compressibility and flow on color Doppler imaging. Profunda Femoral Vein: No evidence of thrombus. Normal compressibility and flow on color Doppler imaging. Femoral Vein: No evidence of thrombus. Normal compressibility, respiratory phasicity and response to augmentation. Popliteal Vein: No evidence of thrombus. Normal compressibility, respiratory phasicity and response to augmentation. Calf  Veins: No evidence of thrombus. Normal compressibility and flow on color Doppler imaging. Superficial Great Saphenous Vein: No evidence of thrombus. Normal compressibility and flow on color Doppler imaging. Other Findings:  None. IMPRESSION: Sonographic survey of the left lower extremity negative for DVT Electronically Signed   By: Gilmer MorJaime  Wagner D.O.   On: 02/22/2018 15:44       Assessment & Plan:   Problem List Items Addressed This Visit    Cough - Primary    Recently diagnosed with bronchitis.  On doxycycline.  Tolerating.  Improved, but still with increased congestion and cough.  Will extend out the abx.  flovent inhaler as directed.  Albuterol inhaler as needed.  Check cxr.  Robitussin DM as directed.  Continue nexium.        Relevant Orders   DG Chest 2 View (Completed)   Essential hypertension, benign    Blood pressure under good control.  Continue same medication regimen.  Follow pressures.  Follow metabolic panel.         Other Visit Diagnoses    Wheezing       Relevant Orders   DG Chest 2 View (Completed)       Dale Durhamharlene Rhaelyn Giron, MD

## 2018-05-27 NOTE — Patient Instructions (Signed)
Restart the robitussin DM twice a day as needed for cough and congestion

## 2018-05-27 NOTE — Telephone Encounter (Signed)
Pt scheduled  

## 2018-05-27 NOTE — Telephone Encounter (Signed)
I can work her in today - can see if she can come in at 10:00.

## 2018-06-02 ENCOUNTER — Encounter: Payer: Self-pay | Admitting: Internal Medicine

## 2018-06-02 NOTE — Assessment & Plan Note (Signed)
Blood pressure under good control.  Continue same medication regimen.  Follow pressures.  Follow metabolic panel.   

## 2018-06-02 NOTE — Assessment & Plan Note (Signed)
Recently diagnosed with bronchitis.  On doxycycline.  Tolerating.  Improved, but still with increased congestion and cough.  Will extend out the abx.  flovent inhaler as directed.  Albuterol inhaler as needed.  Check cxr.  Robitussin DM as directed.  Continue nexium.

## 2018-06-05 ENCOUNTER — Ambulatory Visit
Admission: RE | Admit: 2018-06-05 | Discharge: 2018-06-05 | Disposition: A | Discharge status: Home / Self Care | Payer: Medicare HMO | Source: Ambulatory Visit | Attending: Internal Medicine | Admitting: Internal Medicine

## 2018-06-05 DIAGNOSIS — Z1231 Encounter for screening mammogram for malignant neoplasm of breast: Secondary | ICD-10-CM | POA: Diagnosis not present

## 2018-06-12 ENCOUNTER — Encounter: Payer: Self-pay | Admitting: Internal Medicine

## 2018-06-12 MED ORDER — BENZONATATE 100 MG PO CAPS: 100.0000 mg | ORAL_CAPSULE | Freq: Three times a day (TID) | ORAL | 0 refills | Status: DC | PRN

## 2018-06-12 NOTE — Telephone Encounter (Signed)
She has problems swallowing pills.  Reviewed note.  She is having persistent cough, but feels better.  Does she think she would be able to take tessalon perles.  If so, can send in rx for tessalon perles 100mg  tid prn cough #21 with no refills.  If persistent problems, will need to be evaluated.

## 2018-06-12 NOTE — Telephone Encounter (Signed)
Spoke with patient. She is not having any new symptoms and does feel better since last visit. She is still having a persistent cough. Has been taking Robitussin DM but cannot take at night because it keeps her awake. She says that she is exhausted from coughing so much. She is wondering if anything can be sent in or if there is something she can take. Confirmed she is not having any other acute symptoms.

## 2018-06-12 NOTE — Telephone Encounter (Signed)
Pt aware. Sent in tessalon perles TID PRN.

## 2018-07-01 ENCOUNTER — Telehealth: Payer: Medicare HMO | Admitting: Nurse Practitioner

## 2018-07-01 DIAGNOSIS — N3000 Acute cystitis without hematuria: Secondary | ICD-10-CM

## 2018-07-01 MED ORDER — CEPHALEXIN 500 MG PO CAPS: 500.0000 mg | ORAL_CAPSULE | Freq: Two times a day (BID) | ORAL | 0 refills | Status: DC

## 2018-07-01 NOTE — Progress Notes (Signed)

## 2018-07-03 ENCOUNTER — Encounter: Payer: Self-pay | Admitting: Family Medicine

## 2018-07-03 ENCOUNTER — Ambulatory Visit (INDEPENDENT_AMBULATORY_CARE_PROVIDER_SITE_OTHER): Payer: Medicare HMO | Admitting: Family Medicine

## 2018-07-03 VITALS — BP 122/72 | HR 93 | Temp 98.1°F | Resp 18 | Ht 67.0 in | Wt 224.2 lb

## 2018-07-03 DIAGNOSIS — N390 Urinary tract infection, site not specified: Secondary | ICD-10-CM | POA: Diagnosis not present

## 2018-07-03 DIAGNOSIS — R05 Cough: Secondary | ICD-10-CM | POA: Diagnosis not present

## 2018-07-03 DIAGNOSIS — R058 Other specified cough: Secondary | ICD-10-CM

## 2018-07-03 DIAGNOSIS — J309 Allergic rhinitis, unspecified: Secondary | ICD-10-CM | POA: Diagnosis not present

## 2018-07-03 DIAGNOSIS — R0982 Postnasal drip: Secondary | ICD-10-CM | POA: Diagnosis not present

## 2018-07-03 MED ORDER — LORATADINE 5 MG/5ML PO SOLN: 10.0000 mL | Freq: Every day | ORAL | 1 refills | Status: DC

## 2018-07-03 MED ORDER — PREDNISOLONE SODIUM PHOSPHATE 15 MG/5ML PO SOLN: 15.0000 mg | Freq: Every day | ORAL | 0 refills | Status: AC

## 2018-07-03 MED ORDER — DEXTROMETHORPHAN-GUAIFENESIN 10-100 MG/5ML PO LIQD: 10.0000 mL | Freq: Four times a day (QID) | ORAL | 1 refills | Status: DC | PRN

## 2018-07-03 MED ORDER — PREDNISOLONE SODIUM PHOSPHATE 15 MG/5ML PO SOLN: 15.0000 mg | Freq: Every day | ORAL | 0 refills | Status: DC

## 2018-07-03 NOTE — Progress Notes (Signed)
Subjective:    [name], female    DOB: 10-14-1945, 73 y.o.   MRN: [id number]  HPI   Patient presents to clinic due to continued coughing, sinus congestion that has been present for about a month.  Patient was diagnosed with a respiratory infection/bronchitis back in January 2020.  She took a course of doxycycline and used albuterol inhaler.  States cough does seem somewhat improved over the course of the past month, but still is present and mainly is bothers him at night when she is laying down.  States she is not coughing up thick phlegm.  Denies wheezing or feeling short of breath when up walking around or when lying down.  Denies body aches.  Denies chest pain.  Denies fever or chills.  States she does feel drainage down back of throat and some pressure in sinuses.  Currently is not on any sort of allergy medication.  Patient did call and do an E-visit on 07/01/2018 due to urinary symptoms.  She is currently on Keflex 500 mg twice daily for 7 days to treat a UTI.  Patient states her urinary symptoms do seem improved today, denies burning with urination or increased frequency.    Patient Active Problem List   Diagnosis Date Noted  . Abdominal bloating 12/09/2017  . Osteopenia 04/29/2017  . Hyperglycemia 04/27/2017  . Reflux esophagitis   . Heartburn   . Special screening for malignant neoplasms, colon   . Hair loss 10/26/2015  . Fatigue 10/21/2015  . Vitamin D deficiency 03/15/2015  . Weight loss counseling, encounter for 02/22/2015  . Facial erythema 07/27/2014  . Health care maintenance 07/05/2014  . BP (high blood pressure) 03/26/2014  . Headache 03/08/2014  . Eyelid lesion 03/08/2014  . Shingles 11/16/2013  . Light headedness 06/22/2013  . Cough 05/18/2013  . GERD (gastroesophageal reflux disease) 05/18/2013  . Swelling of extremity 03/01/2013  . Left knee pain 03/01/2013  . Essential hypertension, benign 12/15/2012  . Frequent UTI 12/15/2012  .  Depression 12/15/2012  . Environmental allergies 12/15/2012   Social History   Tobacco Use  . Smoking status: Never Smoker  . Smokeless tobacco: Never Used  Substance Use Topics  . Alcohol use: No    Alcohol/week: 0.0 standard drinks   Review of Systems s  Constitutional: Negative for chills, fatigue and fever.  HENT: +congestion, ear pain, sinus pain. No sore throat.   Eyes: Negative.   Respiratory: +cough. Negative for shortness of breath and wheezing.   Cardiovascular: Negative for chest pain, palpitations and leg swelling.  Gastrointestinal: Negative for abdominal pain, diarrhea, nausea and vomiting.  Genitourinary: Negative for dysuria, frequency and urgency.  Musculoskeletal: Negative for arthralgias and myalgias.  Skin: Negative for color change, pallor and rash.  Neurological: Negative for syncope, light-headedness and headaches.  Psychiatric/Behavioral: The patient is not nervous/anxious.       Objective:   Physical Exam Vitals signs and nursing note reviewed.  Constitutional:      General: She is not in acute distress.    Appearance: She is not ill-appearing or toxic-appearing.  HENT:     Head: Normocephalic and atraumatic.     Ears:     Comments: Mild fullness bilateral TMs    Nose: Congestion and rhinorrhea (clear drainage) present.     Mouth/Throat:     Mouth: Mucous membranes are moist.     Pharynx: No oropharyngeal exudate or posterior oropharyngeal erythema.     Comments: +post nasal drip patterning in  back of throat Eyes:     Extraocular Movements: Extraocular movements intact.  Neck:     Musculoskeletal: Neck supple. No neck rigidity.  Cardiovascular:     Rate and Rhythm: Normal rate and regular rhythm.  Pulmonary:     Effort: Pulmonary effort is normal. No respiratory distress.     Breath sounds: Normal breath sounds. No wheezing, rhonchi or rales.  Abdominal:     General: Bowel sounds are normal. There is no distension.     Palpations: Abdomen  is soft. There is no mass.     Tenderness: There is no abdominal tenderness. There is no right CVA tenderness, left CVA tenderness, guarding or rebound.  Lymphadenopathy:     Cervical: No cervical adenopathy.  Skin:    General: Skin is warm and dry.     Coloration: Skin is not jaundiced or pale.  Neurological:     Mental Status: She is alert and oriented to person, place, and time.  Psychiatric:        Mood and Affect: Mood normal.        Behavior: Behavior normal.    Vitals:   07/03/18 0957  BP: 122/72  Pulse: 93  Resp: 18  Temp: 98.1 F (36.7 C)  SpO2: 98%      Assessment & Plan:    A total of 25 minutes were spent face-to-face with the patient during this encounter and over half of that time was spent on counseling and coordination of care. The patient was counseled on cough causes, treatment for cough, why talking multiple antibiotics close together is not good.    Postviral cough syndrome, postnasal drainage, allergic rhinitis- suspect patient's cough is related to bronchitis from January 2020.  Chest x-ray from January 2020 was negative for any pneumonia, lung sounds are clear on auscultation in exam room today. Advised patients that oftentimes cough can linger and take a long time to fully resolve.  Cough could also be exacerbated by postnasal drainage related to allergic rhinitis.  Patient will do oral prednisolone course, and begin oral loratadine daily.  Tussend cough syrup also sent to pharmacy for patient to take as needed for cough.  Patient prefers liquid formulations of medications, so all her medications are in liquid form.  Frequent UTI - patient advised to finish Keflex as prescribed by NP that did her electronic visit.  Advised that due to her taking frequent antibiotics, I would recommend she take a daily probiotic to help replace the good bacteria in her system.  Also advised if she continues to get UTIs, she really should consider a urology referral.  Patient  will keep regular scheduled follow-up with PCP as planned.  Advised return to clinic sooner if any issues arise.

## 2018-08-12 ENCOUNTER — Telehealth: Payer: Self-pay

## 2018-08-12 NOTE — Telephone Encounter (Signed)
Spoke with patient, she is having pain in her left knee x4 weeks causing her to limp. No injuries noted. There is some swelling but no significant size difference in her two knees. She has some muscle cream and was wondering if it would be okay to see if that helps. Offered virtual visit or phone visit and she was very hesitant she would like to know if there is something that she can try and if that doesn't work then she will do visit.

## 2018-08-12 NOTE — Telephone Encounter (Signed)
Does she have the name of the muscle cream? Should be ok to use.  Also, can see if [facility] is still having there walk in clinic for acute ortho problems, if she feels she needs to be seen.  Would save her two visits.  Let me know if any questions or problems.

## 2018-08-12 NOTE — Telephone Encounter (Signed)
Copied from CRM #[id number]. Topic: Appointment Scheduling - Scheduling Inquiry for Clinic >> Aug 12, 2018  1:23 PM Crist Infante wrote: Reason for CRM: pt states her left leg is paining her very bad and causing her to limp.  This is the 4th wk.  She is requesting appt with Dr Lorin Picket. Please call back No answer on the phone when called office.

## 2018-08-13 NOTE — Telephone Encounter (Signed)
Ok to use bengay.

## 2018-08-13 NOTE — Telephone Encounter (Signed)
The muscle cream is OTC bengay. She would like to try this first and hold on ortho appt at this time. Advised patient to let me know if persistent problems

## 2018-08-13 NOTE — Telephone Encounter (Signed)
Pt advised of below

## 2018-09-23 ENCOUNTER — Other Ambulatory Visit: Payer: Self-pay | Admitting: Internal Medicine

## 2018-10-10 ENCOUNTER — Other Ambulatory Visit: Payer: Self-pay | Admitting: Internal Medicine

## 2018-10-10 DIAGNOSIS — K219 Gastro-esophageal reflux disease without esophagitis: Secondary | ICD-10-CM

## 2018-11-09 ENCOUNTER — Other Ambulatory Visit: Payer: Self-pay

## 2018-11-09 ENCOUNTER — Emergency Department
Admission: EM | Admit: 2018-11-09 | Discharge: 2018-11-09 | Disposition: A | Discharge status: Home / Self Care | Payer: Medicare HMO | Attending: Emergency Medicine | Admitting: Emergency Medicine

## 2018-11-09 ENCOUNTER — Emergency Department: Payer: Medicare HMO

## 2018-11-09 ENCOUNTER — Encounter: Payer: Self-pay | Admitting: Emergency Medicine

## 2018-11-09 DIAGNOSIS — Z79899 Other long term (current) drug therapy: Secondary | ICD-10-CM | POA: Insufficient documentation

## 2018-11-09 DIAGNOSIS — R079 Chest pain, unspecified: Secondary | ICD-10-CM | POA: Insufficient documentation

## 2018-11-09 DIAGNOSIS — J811 Chronic pulmonary edema: Secondary | ICD-10-CM | POA: Diagnosis not present

## 2018-11-09 DIAGNOSIS — I10 Essential (primary) hypertension: Secondary | ICD-10-CM | POA: Insufficient documentation

## 2018-11-09 DIAGNOSIS — R0789 Other chest pain: Secondary | ICD-10-CM | POA: Diagnosis not present

## 2018-11-09 LAB — COMPREHENSIVE METABOLIC PANEL
ALT: 15 U/L (ref 0–44)
AST: 19 U/L (ref 15–41)
Albumin: 4 g/dL (ref 3.5–5.0)
Alkaline Phosphatase: 81 U/L (ref 38–126)
Anion gap: 10 (ref 5–15)
BUN: 18 mg/dL (ref 8–23)
CO2: 24 mmol/L (ref 22–32)
Calcium: 9.5 mg/dL (ref 8.9–10.3)
Chloride: 104 mmol/L (ref 98–111)
Creatinine, Ser: 0.76 mg/dL (ref 0.44–1.00)
GFR calc Af Amer: >60 mL/min (ref >60)
GFR calc non Af Amer: >60 mL/min (ref >60)
Glucose, Bld: 155 mg/dL — ABNORMAL HIGH (ref 70–99)
Potassium: 3.5 mmol/L (ref 3.5–5.1)
Sodium: 138 mmol/L (ref 135–145)
Total Bilirubin: 0.6 mg/dL (ref 0.3–1.2)
Total Protein: 7.6 g/dL (ref 6.5–8.1)

## 2018-11-09 LAB — TROPONIN I (HIGH SENSITIVITY)
Troponin I (High Sensitivity): 5 ng/L (ref <18)
Troponin I (High Sensitivity): 5 ng/L (ref <18)

## 2018-11-09 LAB — CBC
HCT: 36.9 % (ref 36.0–46.0)
Hemoglobin: 12.3 g/dL (ref 12.0–15.0)
MCH: 29.4 pg (ref 26.0–34.0)
MCHC: 33.3 g/dL (ref 30.0–36.0)
MCV: 88.3 fL (ref 80.0–100.0)
Platelets: 272 10*3/uL (ref 150–400)
RBC: 4.18 MIL/uL (ref 3.87–5.11)
RDW: 13.2 % (ref 11.5–15.5)
WBC: 10.9 10*3/uL — ABNORMAL HIGH (ref 4.0–10.5)
nRBC: 0 % (ref 0.0–0.2)

## 2018-11-09 LAB — BRAIN NATRIURETIC PEPTIDE: B Natriuretic Peptide: 90 pg/mL (ref 0.0–100.0)

## 2018-11-09 LAB — LIPASE, BLOOD: Lipase: 29 U/L (ref 11–51)

## 2018-11-09 MED ORDER — ASPIRIN 81 MG PO CHEW
324.0000 mg | CHEWABLE_TABLET | Freq: Once | ORAL | Status: AC
  Administered 2018-11-09: 10:00:00 324 mg via ORAL
  Filled 2018-11-09: qty 4

## 2018-11-09 MED ORDER — HYDROCHLOROTHIAZIDE 12.5 MG PO CAPS
12.5000 mg | ORAL_CAPSULE | Freq: Every day | ORAL | Status: DC
  Administered 2018-11-09: 09:00:00 12.5 mg via ORAL
  Filled 2018-11-09: qty 1

## 2018-11-09 MED ORDER — SODIUM CHLORIDE 0.9% FLUSH: 3.0000 mL | Freq: Once | INTRAVENOUS | Status: DC

## 2018-11-09 MED ORDER — LISINOPRIL 10 MG PO TABS
10.0000 mg | ORAL_TABLET | Freq: Every day | ORAL | Status: DC
  Administered 2018-11-09: 10 mg via ORAL
  Filled 2018-11-09: qty 1

## 2018-11-09 MED ORDER — LISINOPRIL-HYDROCHLOROTHIAZIDE 10-12.5 MG PO TABS: 1.0000 | ORAL_TABLET | Freq: Every day | ORAL | Status: DC

## 2018-11-09 NOTE — ED Provider Notes (Signed)
[facility] Emergency Department Provider Note   ____________________________________________   First MD Initiated Contact with Patient 11/09/18 [phone]     (approximate)  I have reviewed the triage vital signs and the nursing notes.   HISTORY  Chief Complaint Chest Pain    HPI Susan Miller is a 73 y.o. female history of hypertension depression  Patient reports today that she noticed last night that she just feels like her chest area felt slightly funny and felt like she was sleeping on uncomfortable bit of border.  Could have hard to describe.  She reports this morning when she got up after sleeping that she noticed that this feels like her bra is too tight.  She denies heavy pain or pressure.  There is no radiation.  She denies heart history.  Has a family history of coronary disease in her mother who is older when she had issues  She is not a smoker.  She has a history of high blood pressure.  She is had no abdominal pain.  No nausea vomiting.  No fevers or chills.  She reports she has been a little bit depressed more than her typical because of having to be indoors for 3 months but denies any concerns to herself or anyone else just reports is kind of tired and boring and sometimes frustrating to be inside so much because of COVID.  She has had no fevers or chills.  No sore throat.  She is not exposure to COVID  Currently discomfort is very minimal she reports just a slight swollen feeling.  Is not noticed any unintended weight gain.  She is not noticed any swelling of her feet or hands.  She does not have any trouble breathing   Past Medical History:  Diagnosis Date   Allergy    Depression    History of chicken pox    Hx: UTI (urinary tract infection)    Hypertension     Patient Active Problem List   Diagnosis Date Noted   Abdominal bloating 12/09/2017   Osteopenia 04/29/2017   Hyperglycemia 04/27/2017   Reflux esophagitis     Heartburn    Special screening for malignant neoplasms, colon    Hair loss 10/26/2015   Fatigue 10/21/2015   Vitamin D deficiency 03/15/2015   Weight loss counseling, encounter for 02/22/2015   Facial erythema 07/27/2014   Health care maintenance 07/05/2014   BP (high blood pressure) 03/26/2014   Headache 03/08/2014   Eyelid lesion 03/08/2014   Shingles 11/16/2013   Light headedness 06/22/2013   Cough 05/18/2013   GERD (gastroesophageal reflux disease) 05/18/2013   Swelling of extremity 03/01/2013   Left knee pain 03/01/2013   Essential hypertension, benign 12/15/2012   Frequent UTI 12/15/2012   Depression 12/15/2012   Environmental allergies 12/15/2012    Past Surgical History:  Procedure Laterality Date   COLONOSCOPY WITH PROPOFOL N/A 06/20/2016   Procedure: COLONOSCOPY WITH PROPOFOL;  Surgeon: Midge Miniumarren Wohl, MD;  Location: [facility] ENDOSCOPY;  Service: Endoscopy;  Laterality: N/A;   ESOPHAGOGASTRODUODENOSCOPY (EGD) WITH PROPOFOL N/A 06/20/2016   Procedure: ESOPHAGOGASTRODUODENOSCOPY (EGD) WITH PROPOFOL;  Surgeon: Midge Miniumarren Wohl, MD;  Location: [facility] ENDOSCOPY;  Service: Endoscopy;  Laterality: N/A;   TONSILLECTOMY     as a child    Prior to Admission medications   Medication Sig Start Date End Date Taking? Authorizing Provider  albuterol (PROVENTIL HFA;VENTOLIN HFA) 108 (90 Base) MCG/ACT inhaler Inhale 2 puffs into the lungs every 6 (six) hours as needed for  wheezing or shortness of breath. 05/27/18   Einar Pheasant, MD  benzonatate (TESSALON) 100 MG capsule Take 1 capsule (100 mg total) by mouth 3 (three) times daily as needed for cough. 06/12/18   Einar Pheasant, MD  cephALEXin (KEFLEX) 500 MG capsule Take 1 capsule (500 mg total) by mouth 2 (two) times daily. 07/01/18   Hassell Done, Mary-Margaret, FNP  dextromethorphan-guaiFENesin (TUSSIN DM) 10-100 MG/5ML liquid Take 10 mLs by mouth every 6 (six) hours as needed for cough. 07/03/18   Jodelle Green, FNP  doxycycline  (VIBRA-TABS) 100 MG tablet Take 1 tablet (100 mg total) by mouth 2 (two) times daily. 05/27/18   Einar Pheasant, MD  esomeprazole (NEXIUM) 40 MG capsule TAKE 1 CAPSULE DAILY BEFORE BREAKFAST 10/16/18   Einar Pheasant, MD  fluticasone (FLOVENT HFA) 110 MCG/ACT inhaler Inhale 2 puffs into the lungs 2 (two) times daily. Rinse mouth after use 05/27/18   Einar Pheasant, MD  gabapentin (NEURONTIN) 100 MG capsule Take 1 capsule (100 mg total) by mouth at bedtime. 02/15/18   Jodelle Green, FNP  lisinopril-hydrochlorothiazide (ZESTORETIC) 10-12.5 MG tablet TAKE 1 TABLET EVERY DAY 09/23/18   Einar Pheasant, MD  Loratadine 5 MG/5ML SOLN Take 10 mLs (10 mg total) by mouth daily. 07/03/18   Jodelle Green, FNP  sertraline (ZOLOFT) 25 MG tablet TAKE 1 TABLET EVERY DAY 09/23/18   Einar Pheasant, MD    Allergies Codeine, Iodinated diagnostic agents, and Nitrofurantoin monohyd macro  Family History  Problem Relation Age of Onset   Hypertension Mother    Cancer Sister        colon cancer   Hypertension Sister    Diabetes Sister    Hypertension Brother    Hypertension Maternal Aunt    Breast cancer Maternal Aunt 60   Hypertension Maternal Uncle    Stroke Maternal Grandmother    Hypertension Maternal Grandmother     Social History Social History   Tobacco Use   Smoking status: Never Smoker   Smokeless tobacco: Never Used  Substance Use Topics   Alcohol use: No    Alcohol/week: 0.0 standard drinks   Drug use: No    Review of Systems Constitutional: No fever/chills Eyes: No visual changes. ENT: No sore throat. Cardiovascular: Denies chest pain.  Reports more of a swollen or tight feeling across and around the chest Respiratory: Denies shortness of breath. Gastrointestinal: No abdominal pain.   Genitourinary: Negative for dysuria. Musculoskeletal: Negative for back pain. Skin: Negative for rash. Neurological: Negative for headaches, areas of focal weakness or  numbness.    ____________________________________________   PHYSICAL EXAM:  VITAL SIGNS: ED Triage Vitals  Enc Vitals Group     BP 11/09/18 0827 (!) 176/85     Pulse Rate 11/09/18 0827 88     Resp 11/09/18 0827 20     Temp 11/09/18 0827 98.7 F (37.1 C)     Temp Source 11/09/18 0827 Oral     SpO2 11/09/18 0827 94 %     Weight 11/09/18 0825 212 lb (96.2 kg)     Height 11/09/18 0825 5\' 7"  (1.702 m)     Head Circumference --      Peak Flow --      Pain Score 11/09/18 0825 5     Pain Loc --      Pain Edu? --      Excl. in [place]? --     Constitutional: Alert and oriented. Well appearing and in no acute distress. Eyes: Conjunctivae are normal.  Head: Atraumatic. Nose: No congestion/rhinnorhea. Mouth/Throat: Mucous membranes are moist. Neck: No stridor.  No JVD. Cardiovascular: Normal rate, regular rhythm. Grossly normal heart sounds.  Good peripheral circulation. Respiratory: Normal respiratory effort.  No retractions. Lungs CTAB. Gastrointestinal: Soft and nontender. No distention.  Negative Murphy Musculoskeletal: No lower extremity tenderness nor edema.  No pitting edema bilateral.  No edema of the hands bilateral Neurologic:  Normal speech and language. No gross focal neurologic deficits are appreciated.  Skin:  Skin is warm, dry and intact. No rash noted. Psychiatric: Mood and affect are normal. Speech and behavior are normal.  ____________________________________________   LABS (all labs ordered are listed, but only abnormal results are displayed)  Labs Reviewed  CBC - Abnormal; Notable for the following components:      Result Value   WBC 10.9 (*)    All other components within normal limits  COMPREHENSIVE METABOLIC PANEL - Abnormal; Notable for the following components:   Glucose, Bld 155 (*)    All other components within normal limits  TROPONIN I (HIGH SENSITIVITY)  LIPASE, BLOOD  BRAIN NATRIURETIC PEPTIDE  TROPONIN I (HIGH SENSITIVITY)  TROPONIN I (HIGH  SENSITIVITY)   ____________________________________________  EKG  Reviewed interpreted 830 Heart rate 95 QRS 89 QTc 460 Normal sinus rhythm, no evidence of acute ischemia.  Slight artifact suspect V5 V6.  Plan to repeat   Repeat EKG reviewed at 930 Heart rate 85 QRS 110 QTc 470 Normal sinus rhythm, no evidence of ischemic abnormalities.  Slightly differently lead positioning ____________________________________________  RADIOLOGY  Dg Chest Portable 1 View  Result Date: 11/09/2018 CLINICAL DATA:  73 year old female with chest and back pain EXAM: PORTABLE CHEST 1 VIEW COMPARISON:  Prior chest x-ray 05/27/2018 FINDINGS: Stable cardiac and mediastinal contours. Trace atherosclerotic calcifications again noted in the transverse aorta. Minimal pulmonary vascular congestion without overt edema. No focal airspace consolidation, pleural effusion or pneumothorax. Chronic bronchitic changes are stable. No acute osseous abnormality. IMPRESSION: Mild pulmonary vascular congestion without overt edema. Mild chronic bronchitic changes. Electronically Signed   By: Malachy MoanHeath  McCullough M.D.   On: 11/09/2018 09:13    Reviewed, slight vascular congestion.  Mild bronchitic changes ____________________________________________   PROCEDURES  Procedure(s) performed: None  Procedures  Critical Care performed: No  ____________________________________________   INITIAL IMPRESSION / ASSESSMENT AND PLAN / ED COURSE  Pertinent labs & imaging results that were available during my care of the patient were reviewed by me and considered in my medical decision making (see chart for details).   Patient comes for evaluation of a tight or discomfort swollen feeling in the chest.  No overt evidence of edema.  She appears well, hypertensive but had not taken her medication at this morning which have ordered.  She also reports ongoing depression, but this is not cause for her request to be seen today.  There is no  nausea vomiting.  There is no fevers or chills.  She is had no exposure to COVID.  No shortness of breath.  No pleuritic pain.  Chest x-ray assuring though some slight vascular congestion.  Will obtain troponin, atypical symptoms, heart score 3    ----------------------------------------- 2:28 PM on 11/09/2018 -----------------------------------------  Patient resting comfortably.  No distress or concerns.  2 troponins both very reassuring low risk for ACS.  Discussed careful return precautions and she will follow-up closely with Dr. Lorin PicketScott.  Return precautions and treatment recommendations and follow-up discussed with the patient who is agreeable with the plan.  Susan SensingLinda H Camacho was evaluated  in Emergency Department on 11/09/2018 for the symptoms described in the history of present illness. She was evaluated in the context of the global COVID-19 pandemic, which necessitated consideration that the patient might be at risk for infection with the SARS-CoV-2 virus that causes COVID-19. Institutional protocols and algorithms that pertain to the evaluation of patients at risk for COVID-19 are in a state of rapid change based on information released by regulatory bodies including the CDC and federal and state organizations. These policies and algorithms were followed during the patient's care in the ED.  Patient does not demonstrate risk factors or symptoms of COVID  ____________________________________________   FINAL CLINICAL IMPRESSION(S) / ED DIAGNOSES  Final diagnoses:  Chest pain with low risk for cardiac etiology        Note:  This document was prepared using Dragon voice recognition software and may include unintentional dictation errors       Sharyn CreamerQuale, Kajuan Guyton, MD 11/09/18 1428

## 2018-11-09 NOTE — Discharge Instructions (Addendum)

## 2018-11-09 NOTE — ED Notes (Signed)
Pt states she has been having back pain since last night and feels like she has been having indigestion since this morning- c/o nausea, denies vomiting

## 2018-11-09 NOTE — ED Notes (Signed)
X-ray at bedside

## 2018-11-09 NOTE — ED Notes (Signed)
Pt assisted to bedside commode, pt with steady gait. Peri-care performed independently. Pt assisted back to bed. Pt in NAD. This RN will continue to monitor.

## 2018-11-09 NOTE — ED Notes (Addendum)
Report given to Annie, RN

## 2018-11-09 NOTE — ED Triage Notes (Signed)
States back and chest pain like "bra too tight" since 10p last night.

## 2018-11-12 ENCOUNTER — Other Ambulatory Visit: Payer: Self-pay

## 2018-11-12 ENCOUNTER — Other Ambulatory Visit (INDEPENDENT_AMBULATORY_CARE_PROVIDER_SITE_OTHER): Payer: Medicare HMO

## 2018-11-12 DIAGNOSIS — E7800 Pure hypercholesterolemia, unspecified: Secondary | ICD-10-CM

## 2018-11-12 DIAGNOSIS — N3000 Acute cystitis without hematuria: Secondary | ICD-10-CM | POA: Diagnosis not present

## 2018-11-12 DIAGNOSIS — I10 Essential (primary) hypertension: Secondary | ICD-10-CM

## 2018-11-12 DIAGNOSIS — R739 Hyperglycemia, unspecified: Secondary | ICD-10-CM

## 2018-11-12 LAB — LIPID PANEL
Cholesterol: 151 mg/dL (ref 0–200)
HDL: 46.2 mg/dL (ref >39.00)
LDL Cholesterol: 89 mg/dL (ref 0–99)
NonHDL: 104.95
Total CHOL/HDL Ratio: 3
Triglycerides: 80 mg/dL (ref 0.0–149.0)
VLDL: 16 mg/dL (ref 0.0–40.0)

## 2018-11-12 LAB — URINALYSIS, ROUTINE W REFLEX MICROSCOPIC
Ketones, ur: NEGATIVE
Nitrite: NEGATIVE
Specific Gravity, Urine: 1.025 (ref 1.000–1.030)
Total Protein, Urine: 30 — AB
Urine Glucose: NEGATIVE
Urobilinogen, UA: 1 (ref 0.0–1.0)
pH: 5.5 (ref 5.0–8.0)

## 2018-11-12 LAB — BASIC METABOLIC PANEL
BUN: 20 mg/dL (ref 6–23)
CO2: 28 mEq/L (ref 19–32)
Calcium: 9.5 mg/dL (ref 8.4–10.5)
Chloride: 98 mEq/L (ref 96–112)
Creatinine, Ser: 1.23 mg/dL — ABNORMAL HIGH (ref 0.40–1.20)
GFR: 42.84 mL/min — ABNORMAL LOW (ref >60.00)
Glucose, Bld: 129 mg/dL — ABNORMAL HIGH (ref 70–99)
Potassium: 4 mEq/L (ref 3.5–5.1)
Sodium: 138 mEq/L (ref 135–145)

## 2018-11-12 LAB — HEPATIC FUNCTION PANEL
ALT: 14 U/L (ref 0–35)
AST: 15 U/L (ref 0–37)
Albumin: 4.1 g/dL (ref 3.5–5.2)
Alkaline Phosphatase: 81 U/L (ref 39–117)
Bilirubin, Direct: 0.3 mg/dL (ref 0.0–0.3)
Total Bilirubin: 1.4 mg/dL — ABNORMAL HIGH (ref 0.2–1.2)
Total Protein: 7 g/dL (ref 6.0–8.3)

## 2018-11-12 NOTE — Addendum Note (Signed)
Addended by: Leeanne Rio on: 11/12/2018 04:16 PM   Modules accepted: Orders

## 2018-11-13 LAB — POCT GLYCOSYLATED HEMOGLOBIN (HGB A1C)
HbA1c, POC (prediabetic range): 6.1 % (ref 5.7–6.4)
Hemoglobin A1C: 6.1 % — AB (ref 4.0–5.6)

## 2018-11-14 ENCOUNTER — Ambulatory Visit (INDEPENDENT_AMBULATORY_CARE_PROVIDER_SITE_OTHER): Payer: Medicare HMO | Admitting: Internal Medicine

## 2018-11-14 ENCOUNTER — Encounter: Payer: Self-pay | Admitting: Internal Medicine

## 2018-11-14 ENCOUNTER — Other Ambulatory Visit: Payer: Self-pay

## 2018-11-14 DIAGNOSIS — K219 Gastro-esophageal reflux disease without esophagitis: Secondary | ICD-10-CM | POA: Diagnosis not present

## 2018-11-14 DIAGNOSIS — F32A Depression, unspecified: Secondary | ICD-10-CM

## 2018-11-14 DIAGNOSIS — R944 Abnormal results of kidney function studies: Secondary | ICD-10-CM | POA: Diagnosis not present

## 2018-11-14 DIAGNOSIS — R0602 Shortness of breath: Secondary | ICD-10-CM | POA: Diagnosis not present

## 2018-11-14 DIAGNOSIS — R142 Eructation: Secondary | ICD-10-CM | POA: Diagnosis not present

## 2018-11-14 DIAGNOSIS — F329 Major depressive disorder, single episode, unspecified: Secondary | ICD-10-CM | POA: Diagnosis not present

## 2018-11-14 DIAGNOSIS — I10 Essential (primary) hypertension: Secondary | ICD-10-CM

## 2018-11-14 DIAGNOSIS — M549 Dorsalgia, unspecified: Secondary | ICD-10-CM | POA: Diagnosis not present

## 2018-11-14 NOTE — Progress Notes (Addendum)
[name], female   DOB: 1945-11-11, 73 y.o.   MRN: [id number]   Virtual Visit via telephone Note  This visit type was conducted due to national recommendations for restrictions regarding the COVID-19 pandemic (e.g. social distancing).  This format is felt to be most appropriate for this patient at this time.  All issues noted in this document were discussed and addressed.  No physical exam was performed (except for noted visual exam findings with Video Visits).   I connected with Susan Miller by telephone and verified that I am speaking with the correct person using two identifiers. Location patient: home Location provider: work Persons participating in the telephone visit: patient, provider  I discussed the limitations, risks, security and privacy concerns of performing an evaluation and management service by telephone and the availability of in person appointments. The patient expressed understanding and agreed to proceed.   Reason for visit: ER follow up.    HPI: Evaluated in ER 11/09/18.  She reports 11/08/18 - felt like a brick on her back.  Noticed - lying down.  Next morning - "bra felt tight".    Evaluated in ER.  CXR - mild pulmonary vascular congestion.  Cardiac w/up unrevealing.  Discharged home.  After returning home, slept.  Denied nausea, vomiting, diarrhea.  No abdominal pain.  Bowels ok.  States prior to this occurring, she ate at 11/09/18 - barbeque.  Ate hamburger.  Since her ER visit, she has had increased indigestion and burping.  Ate a tomato sandwich with minimal upper abdominal discomfort.  Trying to stay hydrated.  No fever documented.  No headache reported.  Blood pressure elevated in ER.  Has not rechecked.  Does report some sob with exertion.  No chest pain.  Increased stress.  Discussed with her today.  She does not feel needs any further intervention.     ROS: See pertinent positives and negatives per HPI.  Past Medical History:  Diagnosis Date  .  Allergy   . Depression   . History of chicken pox   . Hx: UTI (urinary tract infection)   . Hypertension     Past Surgical History:  Procedure Laterality Date  . COLONOSCOPY WITH PROPOFOL N/A 06/20/2016   Procedure: COLONOSCOPY WITH PROPOFOL;  Surgeon: Midge Miniumarren Wohl, MD;  Location: [facility] ENDOSCOPY;  Service: Endoscopy;  Laterality: N/A;  . ESOPHAGOGASTRODUODENOSCOPY (EGD) WITH PROPOFOL N/A 06/20/2016   Procedure: ESOPHAGOGASTRODUODENOSCOPY (EGD) WITH PROPOFOL;  Surgeon: Midge Miniumarren Wohl, MD;  Location: [facility] ENDOSCOPY;  Service: Endoscopy;  Laterality: N/A;  . TONSILLECTOMY     as a child    Family History  Problem Relation Age of Onset  . Hypertension Mother   . Cancer Sister        colon cancer  . Hypertension Sister   . Diabetes Sister   . Hypertension Brother   . Hypertension Maternal Aunt   . Breast cancer Maternal Aunt 60  . Hypertension Maternal Uncle   . Stroke Maternal Grandmother   . Hypertension Maternal Grandmother     SOCIAL HX: reviewed.    Current Outpatient Medications:  .  albuterol (PROVENTIL HFA;VENTOLIN HFA) 108 (90 Base) MCG/ACT inhaler, Inhale 2 puffs into the lungs every 6 (six) hours as needed for wheezing or shortness of breath., Disp: 1 Inhaler, Rfl: 0 .  esomeprazole (NEXIUM) 40 MG capsule, TAKE 1 CAPSULE DAILY BEFORE BREAKFAST, Disp: 90 capsule, Rfl: 0 .  fluticasone (FLOVENT HFA) 110 MCG/ACT inhaler, Inhale 2 puffs into the lungs 2 (two) times  daily. Rinse mouth after use, Disp: 1 Inhaler, Rfl: 0 .  gabapentin (NEURONTIN) 100 MG capsule, Take 1 capsule (100 mg total) by mouth at bedtime., Disp: 30 capsule, Rfl: 1 .  lisinopril-hydrochlorothiazide (ZESTORETIC) 10-12.5 MG tablet, TAKE 1 TABLET EVERY DAY, Disp: 90 tablet, Rfl: 1 .  Loratadine 5 MG/5ML SOLN, Take 10 mLs (10 mg total) by mouth daily., Disp: 300 mL, Rfl: 1 .  sertraline (ZOLOFT) 25 MG tablet, TAKE 1 TABLET EVERY DAY, Disp: 90 tablet, Rfl: 1  EXAM:  VITALS per patient if applicable: states  blood pressure in ER on arrival 190/90.  Documented ER blood pressure 176/85  GENERAL: alert.  Sounds to be in no acute distress.  Answering questions appropriately.    PSYCH/NEURO: pleasant and cooperative, no obvious depression or anxiety, speech and thought processing grossly intact  ASSESSMENT AND PLAN:  Discussed the following assessment and plan:  Essential hypertension, benign Blood pressure elevated in ER.  Has previously been under reasonable control.  She is going to look for her blood pressure cuff.  Have her spot check her pressure and send in readings.  Recheck metabolic panel.    Decreased GFR Noted on recent labs.  Discussed avoiding taking antiinflammatories.  States she is staying hydrated. Recheck metabolic panel.  If persistent decrease, will adjust blood pressure medication.    Depression Increased stress with covid, etc.  Discussed with her today.  Overall she feels she is handling things relatively well.  Does not feel needs any further intervention.  Follow.    GERD (gastroesophageal reflux disease) With increased belching and indigestion.  Increased nexium to bid.  Follow.  Schedule a f/u soon to reassess.    Hyperbilirubinemia Bilirubin slightly elevated on recent lab.  Recheck liver panel.  Obtain abdominal ultrasound as outlined.    Back pain Recent ER evaluation as outlined.  States felt like "brick" on her back.  Also has had some increased belching and indigestion since visit.  Some abdominal discomfort with eating as outlined.  Slightly elevated bilirubin.  Will obtain abdominal ultrasound.  Recheck liver panel.  Increased nexium as outlined.    SOB (shortness of breath) Some sob with exertion as outlined.  Had the acute episode of discomfort - evaluated in ER.  W/up as outlined.  Will increased nexium and obtain abdominal ultrasound as outlined.  Discussed further cardiac w/up.  She is in agreement.  Refer to cardiology for further evaluation and question  of need for any further cardiac w/up.  Given some sob with exertion, some vascular congestion noted on cxr and acute onset, refer to cardiology as outlined. Will also test for covid.  Pt agreeable.  Discussed self quarantine.      I discussed the assessment and treatment plan with the patient. The patient was provided an opportunity to ask questions and all were answered. The patient agreed with the plan and demonstrated an understanding of the instructions.   The patient was advised to call back or seek an in-person evaluation if the symptoms worsen or if the condition fails to improve as anticipated.  I provided 25 minutes of non-face-to-face time during this encounter.   Einar Pheasant, MD

## 2018-11-15 ENCOUNTER — Other Ambulatory Visit: Payer: Medicare HMO

## 2018-11-15 ENCOUNTER — Ambulatory Visit: Payer: Medicare HMO

## 2018-11-15 ENCOUNTER — Other Ambulatory Visit: Payer: Self-pay | Admitting: *Deleted

## 2018-11-15 ENCOUNTER — Telehealth: Payer: Self-pay | Admitting: *Deleted

## 2018-11-15 ENCOUNTER — Encounter: Payer: Self-pay | Admitting: Internal Medicine

## 2018-11-15 DIAGNOSIS — R0602 Shortness of breath: Secondary | ICD-10-CM | POA: Insufficient documentation

## 2018-11-15 DIAGNOSIS — M549 Dorsalgia, unspecified: Secondary | ICD-10-CM | POA: Insufficient documentation

## 2018-11-15 DIAGNOSIS — Z20822 Contact with and (suspected) exposure to covid-19: Secondary | ICD-10-CM

## 2018-11-15 DIAGNOSIS — R0609 Other forms of dyspnea: Secondary | ICD-10-CM | POA: Insufficient documentation

## 2018-11-15 DIAGNOSIS — R6889 Other general symptoms and signs: Secondary | ICD-10-CM | POA: Diagnosis not present

## 2018-11-15 NOTE — Assessment & Plan Note (Signed)
Recent ER evaluation as outlined.  States felt like "brick" on her back.  Also has had some increased belching and indigestion since visit.  Some abdominal discomfort with eating as outlined.  Slightly elevated bilirubin.  Will obtain abdominal ultrasound.  Recheck liver panel.  Increased nexium as outlined.

## 2018-11-15 NOTE — Assessment & Plan Note (Signed)
With increased belching and indigestion.  Increased nexium to bid.  Follow.  Schedule a f/u soon to reassess.

## 2018-11-15 NOTE — Assessment & Plan Note (Signed)
Noted on recent labs.  Discussed avoiding taking antiinflammatories.  States she is staying hydrated. Recheck metabolic panel.  If persistent decrease, will adjust blood pressure medication.

## 2018-11-15 NOTE — Telephone Encounter (Signed)
Pt referred for COVID-19 testing by Dr. Einar Pheasant.  Pt called and scheduled for testing at [facility] site on 7/0/20. Pt advised to wear a mask and remain in the car at scheduled appt time. Understanding verbalized.

## 2018-11-15 NOTE — Assessment & Plan Note (Signed)
Bilirubin slightly elevated on recent lab.  Recheck liver panel.  Obtain abdominal ultrasound as outlined.

## 2018-11-15 NOTE — Assessment & Plan Note (Signed)
Blood pressure elevated in ER.  Has previously been under reasonable control.  She is going to look for her blood pressure cuff.  Have her spot check her pressure and send in readings.  Recheck metabolic panel.

## 2018-11-15 NOTE — Assessment & Plan Note (Addendum)
Some sob with exertion as outlined.  Had the acute episode of discomfort - evaluated in ER.  W/up as outlined.  Will increased nexium and obtain abdominal ultrasound as outlined.  Discussed further cardiac w/up.  She is in agreement.  Refer to cardiology for further evaluation and question of need for any further cardiac w/up.  Given some sob with exertion, some vascular congestion noted on cxr and acute onset, refer to cardiology as outlined. Will also test for covid.  Pt agreeable.  Discussed self quarantine.

## 2018-11-15 NOTE — Assessment & Plan Note (Signed)
Increased stress with covid, etc.  Discussed with her today.  Overall she feels she is handling things relatively well.  Does not feel needs any further intervention.  Follow.

## 2018-11-21 LAB — NOVEL CORONAVIRUS, NAA: SARS-CoV-2, NAA: NOT DETECTED

## 2018-12-05 ENCOUNTER — Telehealth: Payer: Medicare HMO | Admitting: Family

## 2018-12-05 DIAGNOSIS — R300 Dysuria: Secondary | ICD-10-CM

## 2018-12-05 MED ORDER — CEPHALEXIN 500 MG PO CAPS: 500.0000 mg | ORAL_CAPSULE | Freq: Two times a day (BID) | ORAL | 0 refills | Status: DC

## 2018-12-05 NOTE — Progress Notes (Signed)
We are sorry that you are not feeling well.  Here is how we plan to help!  Based on what you shared with me it looks like you most likely have a simple urinary tract infection.  A UTI (Urinary Tract Infection) is a bacterial infection of the bladder.  Most cases of urinary tract infections are simple to treat but a key part of your care is to encourage you to drink plenty of fluids and watch your symptoms carefully.  I have prescribed Keflex 500 mg twice a day for 7 days. It is a capsule. Open it and sprinkle on applesauce.  Your symptoms should gradually improve. Call us if the burning in your urine worsens, you develop worsening fever, back pain or pelvic pain or if your symptoms do not resolve after completing the antibiotic.  Urinary tract infections can be prevented by drinking plenty of water to keep your body hydrated.  Also be sure when you wipe, wipe from front to back and don't hold it in!  If possible, empty your bladder every 4 hours.  Your e-visit answers were reviewed by a board certified advanced clinical practitioner to complete your personal care plan.  Depending on the condition, your plan could have included both over the counter or prescription medications.  If there is a problem please reply  once you have received a response from your provider.  Your safety is important to [place].  If you have drug allergies check your prescription carefully.    You can use MyChart to ask questions about today's visit, request a non-urgent call back, or ask for a work or school excuse for 24 hours related to this e-Visit. If it has been greater than 24 hours you will need to follow up with your provider, or enter a new e-Visit to address those concerns.   You will get an e-mail in the next two days asking about your experience.  I hope that your e-visit has been valuable and will speed your recovery. Thank you for using e-visits.  Greater than 5 minutes, yet less than 10 minutes of time have  been spent researching, coordinating, and implementing care for this patient today.  Thank you for the details you included in the comment boxes. Those details are very helpful in determining the best course of treatment for you and help [place] to provide the best care.

## 2018-12-11 ENCOUNTER — Ambulatory Visit: Payer: Medicare HMO

## 2018-12-20 ENCOUNTER — Other Ambulatory Visit: Payer: Self-pay

## 2018-12-24 ENCOUNTER — Ambulatory Visit
Admission: RE | Admit: 2018-12-24 | Discharge: 2018-12-24 | Disposition: A | Discharge status: Home / Self Care | Payer: Medicare HMO | Source: Ambulatory Visit | Attending: Internal Medicine | Admitting: Internal Medicine

## 2018-12-24 ENCOUNTER — Ambulatory Visit (INDEPENDENT_AMBULATORY_CARE_PROVIDER_SITE_OTHER): Payer: Medicare HMO | Admitting: Internal Medicine

## 2018-12-24 ENCOUNTER — Other Ambulatory Visit: Payer: Self-pay

## 2018-12-24 VITALS — BP 118/72 | HR 77 | Temp 97.1°F | Resp 16 | Wt 219.0 lb

## 2018-12-24 DIAGNOSIS — R0602 Shortness of breath: Secondary | ICD-10-CM | POA: Diagnosis not present

## 2018-12-24 DIAGNOSIS — F32A Depression, unspecified: Secondary | ICD-10-CM

## 2018-12-24 DIAGNOSIS — R739 Hyperglycemia, unspecified: Secondary | ICD-10-CM | POA: Diagnosis not present

## 2018-12-24 DIAGNOSIS — Z9109 Other allergy status, other than to drugs and biological substances: Secondary | ICD-10-CM

## 2018-12-24 DIAGNOSIS — K219 Gastro-esophageal reflux disease without esophagitis: Secondary | ICD-10-CM | POA: Diagnosis not present

## 2018-12-24 DIAGNOSIS — R944 Abnormal results of kidney function studies: Secondary | ICD-10-CM | POA: Diagnosis not present

## 2018-12-24 DIAGNOSIS — M25562 Pain in left knee: Secondary | ICD-10-CM | POA: Diagnosis not present

## 2018-12-24 DIAGNOSIS — F329 Major depressive disorder, single episode, unspecified: Secondary | ICD-10-CM | POA: Diagnosis not present

## 2018-12-24 DIAGNOSIS — I10 Essential (primary) hypertension: Secondary | ICD-10-CM

## 2018-12-24 LAB — BASIC METABOLIC PANEL
BUN: 15 mg/dL (ref 6–23)
CO2: 28 mEq/L (ref 19–32)
Calcium: 9.8 mg/dL (ref 8.4–10.5)
Chloride: 103 mEq/L (ref 96–112)
Creatinine, Ser: 0.97 mg/dL (ref 0.40–1.20)
GFR: 56.32 mL/min — ABNORMAL LOW (ref >60.00)
Glucose, Bld: 86 mg/dL (ref 70–99)
Potassium: 3.9 mEq/L (ref 3.5–5.1)
Sodium: 139 mEq/L (ref 135–145)

## 2018-12-24 LAB — HEPATIC FUNCTION PANEL
ALT: 14 U/L (ref 0–35)
AST: 16 U/L (ref 0–37)
Albumin: 4.3 g/dL (ref 3.5–5.2)
Alkaline Phosphatase: 68 U/L (ref 39–117)
Bilirubin, Direct: 0.1 mg/dL (ref 0.0–0.3)
Total Bilirubin: 0.4 mg/dL (ref 0.2–1.2)
Total Protein: 7 g/dL (ref 6.0–8.3)

## 2018-12-24 NOTE — Progress Notes (Signed)
[name], female   DOB: 1946/04/11, 73 y.o.   MRN: [id number]   Subjective:    [name], female    DOB: 1945-05-09, 73 y.o.   MRN: [id number]  HPI  Patient here for a scheduled follow up.  She reports she is doing relatively well.  Increased stress.  Discussed with her.  Overall she feels she is handling things relatively well.  On zoloft.  Does report persistent increased left knee pain.  Limits her activity.  No known injury.  No chest pain.  No sob. Previously had reports some sob with exertion.  Had discussed cardiology referral. She declines any symptoms now.  Declines cardiology referral.  No significant acid reflux currently.  On nexium.  No abdominal pain.  Bowels moving.  Kidney function on recent checks - decreased.  Discussed not taking antiinflammatories.  Bilirubin slightly elevated.  Will recheck with labs today.     Past Medical History:  Diagnosis Date  . Allergy   . Depression   . History of chicken pox   . Hx: UTI (urinary tract infection)   . Hypertension    Past Surgical History:  Procedure Laterality Date  . COLONOSCOPY WITH PROPOFOL N/A 06/20/2016   Procedure: COLONOSCOPY WITH PROPOFOL;  Surgeon: Lucilla Lame, MD;  Location: [facility] ENDOSCOPY;  Service: Endoscopy;  Laterality: N/A;  . ESOPHAGOGASTRODUODENOSCOPY (EGD) WITH PROPOFOL N/A 06/20/2016   Procedure: ESOPHAGOGASTRODUODENOSCOPY (EGD) WITH PROPOFOL;  Surgeon: Lucilla Lame, MD;  Location: [facility] ENDOSCOPY;  Service: Endoscopy;  Laterality: N/A;  . TONSILLECTOMY     as a child   Family History  Problem Relation Age of Onset  . Hypertension Mother   . Cancer Sister        colon cancer  . Hypertension Sister   . Diabetes Sister   . Hypertension Brother   . Hypertension Maternal Aunt   . Breast cancer Maternal Aunt 60  . Hypertension Maternal Uncle   . Stroke Maternal Grandmother   . Hypertension Maternal Grandmother    Social History   Socioeconomic History  . Marital  status: Divorced    Spouse name: Not on file  . Number of children: Not on file  . Years of education: Not on file  . Highest education level: Not on file  Occupational History  . Not on file  Social Needs  . Financial resource strain: Not hard at all  . Food insecurity    Worry: Never true    Inability: Never true  . Transportation needs    Medical: No    Non-medical: No  Tobacco Use  . Smoking status: Never Smoker  . Smokeless tobacco: Never Used  Substance and Sexual Activity  . Alcohol use: No    Alcohol/week: 0.0 standard drinks  . Drug use: No  . Sexual activity: Not Currently  Lifestyle  . Physical activity    Days per week: 0 days    Minutes per session: Not on file  . Stress: Not on file  Relationships  . Social Herbalist on phone: Not on file    Gets together: Not on file    Attends religious service: Not on file    Active member of club or organization: Not on file    Attends meetings of clubs or organizations: Not on file    Relationship status: Not on file  Other Topics Concern  . Not on file  Social History Narrative  . Not on file  Outpatient Encounter Medications as of 12/24/2018  Medication Sig  . esomeprazole (NEXIUM) 40 MG capsule TAKE 1 CAPSULE DAILY BEFORE BREAKFAST  . lisinopril-hydrochlorothiazide (ZESTORETIC) 10-12.5 MG tablet TAKE 1 TABLET EVERY DAY  . sertraline (ZOLOFT) 25 MG tablet TAKE 1 TABLET EVERY DAY  . [DISCONTINUED] albuterol (PROVENTIL HFA;VENTOLIN HFA) 108 (90 Base) MCG/ACT inhaler Inhale 2 puffs into the lungs every 6 (six) hours as needed for wheezing or shortness of breath.  . [DISCONTINUED] cephALEXin (KEFLEX) 500 MG capsule Take 1 capsule (500 mg total) by mouth 2 (two) times daily.  . [DISCONTINUED] fluticasone (FLOVENT HFA) 110 MCG/ACT inhaler Inhale 2 puffs into the lungs 2 (two) times daily. Rinse mouth after use  . [DISCONTINUED] gabapentin (NEURONTIN) 100 MG capsule Take 1 capsule (100 mg total) by mouth at  bedtime.  . [DISCONTINUED] Loratadine 5 MG/5ML SOLN Take 10 mLs (10 mg total) by mouth daily.   No facility-administered encounter medications on file as of 12/24/2018.     Review of Systems  Constitutional: Negative for appetite change and unexpected weight change.  HENT: Negative for congestion and sinus pressure.   Respiratory: Negative for cough, chest tightness and shortness of breath.   Cardiovascular: Negative for chest pain, palpitations and leg swelling.  Gastrointestinal: Negative for abdominal pain, diarrhea, nausea and vomiting.  Genitourinary: Negative for difficulty urinating and dysuria.  Musculoskeletal: Negative for myalgias.       Left knee pain as outlined.    Skin: Negative for color change and rash.  Neurological: Negative for dizziness, light-headedness and headaches.  Hematological: Negative for adenopathy.  Psychiatric/Behavioral: Negative for dysphoric mood.       Increased stress as outlined.         Objective:    Physical Exam Constitutional:      General: She is not in acute distress.    Appearance: Normal appearance.  HENT:     Right Ear: External ear normal.     Left Ear: External ear normal.  Eyes:     General: No scleral icterus.       Right eye: No discharge.        Left eye: No discharge.     Conjunctiva/sclera: Conjunctivae normal.  Neck:     Musculoskeletal: Neck supple. No muscular tenderness.     Thyroid: No thyromegaly.  Cardiovascular:     Rate and Rhythm: Normal rate and regular rhythm.  Pulmonary:     Effort: No respiratory distress.     Breath sounds: Normal breath sounds. No wheezing.  Abdominal:     General: Bowel sounds are normal.     Palpations: Abdomen is soft.     Tenderness: There is no abdominal tenderness.  Musculoskeletal:        General: No swelling or tenderness.  Lymphadenopathy:     Cervical: No cervical adenopathy.  Skin:    Findings: No erythema or rash.  Neurological:     General: No focal deficit  present.     Mental Status: She is alert.  Psychiatric:        Mood and Affect: Mood normal.     BP 118/72   Pulse 77   Temp (!) 97.1 F (36.2 C) (Temporal)   Resp 16   Wt 219 lb (99.3 kg)   SpO2 97%   BMI 34.30 kg/m  Wt Readings from Last 3 Encounters:  12/24/18 219 lb (99.3 kg)  11/09/18 212 lb (96.2 kg)  07/03/18 224 lb 3.2 oz (101.7 kg)     Lab  Results  Component Value Date   WBC 10.9 (H) 11/09/2018   HGB 12.3 11/09/2018   HCT 36.9 11/09/2018   PLT 272 11/09/2018   GLUCOSE 86 12/24/2018   CHOL 151 11/12/2018   TRIG 80.0 11/12/2018   HDL 46.20 11/12/2018   LDLCALC 89 11/12/2018   ALT 14 12/24/2018   AST 16 12/24/2018   NA 139 12/24/2018   K 3.9 12/24/2018   CL 103 12/24/2018   CREATININE 0.97 12/24/2018   BUN 15 12/24/2018   CO2 28 12/24/2018   TSH 3.71 05/13/2018   HGBA1C 6.1 (A) 11/13/2018   HGBA1C 6.1 11/13/2018    Dg Chest Portable 1 View  Result Date: 11/09/2018 CLINICAL DATA:  73 year old female with chest and back pain EXAM: PORTABLE CHEST 1 VIEW COMPARISON:  Prior chest x-ray 05/27/2018 FINDINGS: Stable cardiac and mediastinal contours. Trace atherosclerotic calcifications again noted in the transverse aorta. Minimal pulmonary vascular congestion without overt edema. No focal airspace consolidation, pleural effusion or pneumothorax. Chronic bronchitic changes are stable. No acute osseous abnormality. IMPRESSION: Mild pulmonary vascular congestion without overt edema. Mild chronic bronchitic changes. Electronically Signed   By: Jacqulynn Cadet M.D.   On: 11/09/2018 09:13       Assessment & Plan:   Problem List Items Addressed This Visit    Decreased GFR    Avoid antiinflammatories.  Recheck metabolic panel today.        Depression    Increased stress as outlined.  On zoloft.  Overall stable.        Environmental allergies    Stable.       Essential hypertension, benign    Blood pressure doing well.  Continue current medication regimen.   Follow pressures.  Follow metabolic panel.        GERD (gastroesophageal reflux disease)    On nexium.  Controlled.        Hyperbilirubinemia    Bilirubin noted to be slightly increased on recent check.  Recheck liver panel.        Hyperglycemia    Low carb diet and exercise.  Follow met band a1c.       Left knee pain - Primary    Persistent.  Check left knee xray.  Further w/up pending results.        Relevant Orders   DG Knee 1-2 Views Left (Completed)   SOB (shortness of breath)    Denies currently.  Declines cardiology referral.  Follow.           Einar Pheasant, MD

## 2018-12-26 ENCOUNTER — Other Ambulatory Visit: Payer: Self-pay | Admitting: Internal Medicine

## 2018-12-26 DIAGNOSIS — M25562 Pain in left knee: Secondary | ICD-10-CM

## 2018-12-26 NOTE — Progress Notes (Signed)
[name], female   DOB: Apr 18, 1946, 73 y.o.   MRN: [id number] Order placed for physical therapy referral.

## 2018-12-29 ENCOUNTER — Encounter: Payer: Self-pay | Admitting: Internal Medicine

## 2018-12-29 NOTE — Assessment & Plan Note (Signed)
Persistent.  Check left knee xray.  Further w/up pending results.

## 2018-12-29 NOTE — Assessment & Plan Note (Signed)
Avoid antiinflammatories.  Recheck metabolic panel today.

## 2018-12-29 NOTE — Assessment & Plan Note (Signed)
On nexium.  Controlled.   

## 2018-12-29 NOTE — Assessment & Plan Note (Signed)
Denies currently.  Declines cardiology referral.  Follow.

## 2018-12-29 NOTE — Assessment & Plan Note (Signed)
Low carb diet and exercise.  Follow met b and a1c.   

## 2018-12-29 NOTE — Assessment & Plan Note (Signed)
Increased stress as outlined.  On zoloft.  Overall stable.

## 2018-12-29 NOTE — Assessment & Plan Note (Signed)
Blood pressure doing well.  Continue current medication regimen.  Follow pressures.  Follow metabolic panel.  

## 2018-12-29 NOTE — Assessment & Plan Note (Signed)
Bilirubin noted to be slightly increased on recent check.  Recheck liver panel.

## 2018-12-29 NOTE — Assessment & Plan Note (Signed)
Stable

## 2019-01-03 ENCOUNTER — Telehealth: Payer: Medicare HMO | Admitting: Physician Assistant

## 2019-01-03 DIAGNOSIS — R300 Dysuria: Secondary | ICD-10-CM

## 2019-01-03 NOTE — Progress Notes (Signed)
Based on what you shared with me, I feel your condition warrants further evaluation and I recommend that you be seen for a face to face visit.   Because you were just treated on 12/05/18 via telehealth for a UTI, I recommend you be seen face to face so you can leave a urine sample to determine which bacteria you have. This will allow for more accurate treatment plan.    Please contact your primary care physician practice to be seen. Many offices offer virtual options to be seen via video if you are not comfortable going in person to a medical facility at this time.  If you do not have a PCP, [facility] offers a free physician referral service available at [phone]. Our trained staff has the experience, knowledge and resources to put you in touch with a physician who is right for you.   You also have the option of a video visit through [email or web address]  If you are having a true medical emergency please call 911.  NOTE: If you entered your credit card information for this eVisit, you will not be charged. You may see a "hold" on your card for the $35 but that hold will drop off and you will not have a charge processed.  Your e-visit answers were reviewed by a board certified advanced clinical practitioner to complete your personal care plan.  Thank you for using e-Visits.

## 2019-01-06 ENCOUNTER — Other Ambulatory Visit: Payer: Self-pay

## 2019-01-06 ENCOUNTER — Encounter: Payer: Self-pay | Admitting: Internal Medicine

## 2019-01-06 ENCOUNTER — Ambulatory Visit (INDEPENDENT_AMBULATORY_CARE_PROVIDER_SITE_OTHER): Payer: Medicare HMO | Admitting: Internal Medicine

## 2019-01-06 ENCOUNTER — Ambulatory Visit: Payer: Medicare HMO | Admitting: Family

## 2019-01-06 ENCOUNTER — Telehealth: Payer: Self-pay

## 2019-01-06 DIAGNOSIS — R300 Dysuria: Secondary | ICD-10-CM

## 2019-01-06 DIAGNOSIS — N390 Urinary tract infection, site not specified: Secondary | ICD-10-CM | POA: Diagnosis not present

## 2019-01-06 LAB — URINALYSIS, MICROSCOPIC ONLY: RBC / HPF: NONE SEEN (ref 0–?)

## 2019-01-06 LAB — POCT URINALYSIS DIPSTICK
Bilirubin, UA: NEGATIVE
Blood, UA: NEGATIVE
Glucose, UA: NEGATIVE
Ketones, UA: NEGATIVE
Nitrite, UA: NEGATIVE
Protein, UA: NEGATIVE
Spec Grav, UA: <=1.005 — AB (ref 1.010–1.025)
Urobilinogen, UA: 0.2 E.U./dL
pH, UA: 5.5 (ref 5.0–8.0)

## 2019-01-06 MED ORDER — CEFDINIR 250 MG/5ML PO SUSR: 300.0000 mg | Freq: Two times a day (BID) | ORAL | 0 refills | Status: DC

## 2019-01-06 NOTE — Telephone Encounter (Signed)
Pt scheduled  

## 2019-01-06 NOTE — Telephone Encounter (Signed)
Pt did an evisit for another UTI. Do you want to do a f/u with her or place referral to urology?

## 2019-01-06 NOTE — Telephone Encounter (Signed)
Can see if she can do a 4:30 appt with me and I can discuss her uti and further w/up.  See if she can bring a urine sample prior to her doxy visit with me at 4:30 - if desires appt with me.

## 2019-01-06 NOTE — Telephone Encounter (Signed)
Pt coming in at 2:30 to leave urine sample prior to 4:30 appt. Future orders placed.

## 2019-01-08 ENCOUNTER — Ambulatory Visit: Payer: Medicare HMO | Attending: Internal Medicine | Admitting: Physical Therapy

## 2019-01-08 ENCOUNTER — Other Ambulatory Visit: Payer: Self-pay

## 2019-01-08 ENCOUNTER — Encounter: Payer: Self-pay | Admitting: Physical Therapy

## 2019-01-08 ENCOUNTER — Other Ambulatory Visit: Payer: Self-pay | Admitting: Internal Medicine

## 2019-01-08 DIAGNOSIS — R262 Difficulty in walking, not elsewhere classified: Secondary | ICD-10-CM | POA: Diagnosis not present

## 2019-01-08 DIAGNOSIS — R2681 Unsteadiness on feet: Secondary | ICD-10-CM | POA: Diagnosis not present

## 2019-01-08 DIAGNOSIS — M6281 Muscle weakness (generalized): Secondary | ICD-10-CM | POA: Diagnosis not present

## 2019-01-08 DIAGNOSIS — M25562 Pain in left knee: Secondary | ICD-10-CM | POA: Diagnosis not present

## 2019-01-08 LAB — URINE CULTURE
MICRO NUMBER:: 829651
SPECIMEN QUALITY:: ADEQUATE

## 2019-01-08 NOTE — Therapy (Signed)
[facility] [address], [place], [address] Phone: [phone]   Fax:  [phone]  Physical Therapy Evaluation  Patient Details  Name: Susan Miller MRN: [id number] Date of Birth: 30-Dec-1945 Referring Provider (PT): Dale Kelly [place], MD   Encounter Date: 01/08/2019  PT End of Session - 01/09/19 1315    Visit Number  1    Number of Visits  16    Date for PT Re-Evaluation  03/06/19    Authorization Type  [employer] reporting period from 01/08/2019    Authorization Time Period  current cert period: 01/08/2019 - 03/06/2019 (latest PN: IE 01/08/2019)    Authorization - Visit Number  1    Authorization - Number of Visits  10    PT Start Time  1350    PT Stop Time  1445    PT Time Calculation (min)  55 min    Activity Tolerance  Patient tolerated treatment well    Behavior During Therapy  [facility], [place] for tasks assessed/performed       Past Medical History:  Diagnosis Date  . Allergy   . Depression   . History of chicken pox   . Hx: UTI (urinary tract infection)   . Hypertension     Past Surgical History:  Procedure Laterality Date  . COLONOSCOPY WITH PROPOFOL N/A 06/20/2016   Procedure: COLONOSCOPY WITH PROPOFOL;  Surgeon: Midge Minium, MD;  Location: [facility] ENDOSCOPY;  Service: Endoscopy;  Laterality: N/A;  . ESOPHAGOGASTRODUODENOSCOPY (EGD) WITH PROPOFOL N/A 06/20/2016   Procedure: ESOPHAGOGASTRODUODENOSCOPY (EGD) WITH PROPOFOL;  Surgeon: Midge Minium, MD;  Location: [facility] ENDOSCOPY;  Service: Endoscopy;  Laterality: N/A;  . TONSILLECTOMY     as a child    There were no vitals filed for this visit.   Subjective Assessment - 01/09/19 1310    Subjective  Patient reports her L knee just started a few months back but she just didn't deal with it because of COVID19 shutting everything down. She finally got tired of limping so she is now seeking to get it addressed. She had a radiograph and it did not show anything.  She really thinks that the shut-down really restricted people like her oesso she dn't do as much and that has made it worse with no activity. No identifiable injury. She thinks it may have have been gradual onset. Feel tl tight at first and couldn't tell if it was knee or muscle. Sometimes when she moves her knee it bed it is a really sharp pain. The sharp pain is not always in the same spot and it is so fast it is hard to tell where it is. Denies catching or popping, locking. She has had two episodes of buckling if she gets up after sitting a long time. Moving it around usually helps. Has not had any other medicine or treatment. She has no prior episodes. She occasionally has back pain. About 30 years she had an episode of back pain that was severe and couldn't get out of the bed and she cannot remember if it went down her leg. She had to roll into the car to the chiropractor and it took about 1 week to get better. No current worsening in back pain. Pain is intermittent. Having difficulty with steadiness and it feels weak. Notices she is limping when she is walking (limping for months), making it difficult to walk, more careful with housework, etc.    Pertinent History  Patient is a 73  y.o. female who presents to outpatient physical therapy with a referral for medical diagnosis L knee pain. This patient's chief complaints consist of L knee pain, stiffness, limping during walking leading to the following functional deficits: difficulty walking, with stairs, balance, walking over uneven ground, increased fall risk. Relevant past medical history and comorbidities include frequent UTIs (comes and goes every 2-3 years), osteopenia, depression, Hx of depression, stress, hypertension, GERD.    Limitations  Walking;Standing;House hold activities;Other (comment)   stairs   Diagnostic tests  radiograph report  L knee (12/24/2018): "FINDINGS:No evidence of fracture, dislocation, or joint effusion. No evidence of  arthropathy or other focal bone abnormality. Soft tissues are unremarkable."    Patient Stated Goals  "stop limping"    Currently in Pain?  No/denies    Pain Score  0-No pain   current pain as 0/10 (sitting), at best 0/10, at worst 8/10.   Pain Location  Knee    Pain Orientation  Left;Anterior   anterior knee near joint line   Pain Descriptors / Indicators  Shooting;Sharp;Other (Comment)   breif   Pain Type  Chronic pain    Pain Radiating Towards  denies paresthesia, occasionally feels pain in claf    Pain Onset  More than a month ago    Pain Frequency  Intermittent    Aggravating Factors   after sitting for a long time, trying to get up at night to go to the bathroom    Pain Relieving Factors  muscle cream, moving around when it feels stiff.    Effect of Pain on Daily Activities  difficulty walking, with stairs, balance, walking over uneven ground, increased fall risk.        [facility] PT Assessment - 01/09/19 0001      Assessment   Medical Diagnosis  Left knee pain, unspecified chronicity (persistent L knee pain)    Referring Provider (PT)  Dale , MD    Onset Date/Surgical Date  05/10/18    Prior Therapy  none for this problem prior to this episode of care      Precautions   Precautions  None      Restrictions   Weight Bearing Restrictions  No      Balance Screen   Has the patient fallen in the past 6 months  Yes    How many times?  1    Has the patient had a decrease in activity level because of a fear of falling?   No    Is the patient reluctant to leave their home because of a fear of falling?   No      Home Environment   Living Environment  Private residence   no concerns about home at [place].      Prior Function   Level of Independence  Independent    Vocation  Retired    retired from working at [employer] as a Merchandiser, retail (paperwork,   Leisure  shopping, junk stores, spending time with family. Help takes care of son who has some health issues.        Cognition   Overall Cognitive Status  Within Functional Limits for tasks assessed      Observation/Other Assessments   Observations  see note from 01/08/2019 for latest objective data    Focus on Therapeutic Outcomes (FOTO)   FOTO = 52 (01/08/2019);        OBJECTIVE: OBSERVATION/INSPECTION: Patient presents with mild genuvalgum in bilateral knees in standing position. Stands with L knee slightly  flexed.   SPINE MOTION Lumbar AROM:  - WFL and does not reproduce concordant signs.    PERIPHERAL JOINT MOTION (AROM/PROM in degrees):  *Indicates pain -  Hip grossly WFL except limited to 0 degrees bilaterally with knee extended and very tight at bilateral rectus femoris.  Knee - Flexion: R = 140/150 firm end feel, L = 135/145 firm end feel.  - Extension: R = -5, L = 0. Ankle: appears WFL in open chain position, warrants further investigation due to lack of dorsiflexion during ambulation on L side.   STRENGTH:  *Indicates pain Hip  - Flexion: R = 5/5, L = 4/5. - Extension: R = 5/5, L = 4/5. - Abduction: R = 4+/5, L = 4/5. Knee - Ext: R = 5/5, L = 4/5 mild discomfort - Flex: R = 5/5, L = 4+/5. Ankle (seated position) - Dorsiflexion: R = 5/5, L = 5/5. - Eversion: R = 4+/5, L = 4+/5. - Heel walking: two trials = L knee buckled and painful, required BUE support - Toe walking = WNL except L knee feels weak, required BUE support  SPECIAL TESTS: Negative special tests for L knee: McMurray's, varus and valgus at 0 and 30 degrees.   FUNCTIONAL/BALANCE TESTS:  - 5TSTS: 16 seconds with no UE support from low plinth (18.5 inches)  ACCESSORY MOTION:  - Hypomobile in all directions at L patella - AP and PA hypomobility at L tibiofemoral joint.   PALPATION: - Not significantly TTP  FUNCTIONAL MOBILITY: - Bed mobility: rolling and supine <> sit WFL with some difficulty due to weakness.. - Transfers: sit <> stand  - Gait: antalgic gait favoring L LE, lacks knee extension and flexion, toe  walks on L LE, lack of hip flexion L side.  - Stairs: step to gait pattern with BUE support, 4 steps   EDUCATION/COGNITION: Patient is alert and oriented X 4.   Objective measurements completed on examination: See above findings.      TREATMENT:   Therapeutic exercise: to centralize symptoms and improve ROM, strength, muscular endurance, and activity tolerance required for successful completion of functional activities.  - LAQ x 5 on L LE - sit <> stand 2x10 from low plinth.  - Education on diagnosis, prognosis, POC, anatomy and physiology of current condition.  - Education on HEP including handout   HOME EXERCISE PROGRAM Access Code: [email or web address]  URL: [email or web address]  Date: 01/08/2019  Prepared by: Norton BlizzardSara Snyder   Exercises  Seated Long Arc Quad - 1 sets - 10 reps - 1x daily - 7x weekly  Sit to Stand without Arm Support - 3 sets - 10 reps - 1x daily - 7x weekly   Patient response to treatment:  Pt tolerated treatment well. Pt was able to complete all exercises with minimal to no lasting increase in pain or discomfort. Pt required multimodal cuing for proper technique and to facilitate improved neuromuscular control, strength, range of motion, and functional ability resulting in improved performance and form.     PT Education - 01/09/19 1314    Education Details  Exercise purpose/form. Self management techniques. Education on diagnosis, prognosis, POC, anatomy and physiology of current condition Education on HEP including handout    Person(s) Educated  Patient    Methods  Explanation;Demonstration;Tactile cues;Verbal cues;Handout    Comprehension  Verbalized understanding;Returned demonstration       PT Short Term Goals - 01/09/19 1317      PT SHORT TERM GOAL #1   Title  Be independent  with initial home exercise program for self-management of symptoms.    Baseline  provided with initial HEP (01/08/2019);    Time  2    Period  Weeks    Status  New     Target Date  01/23/19        PT Long Term Goals - 01/09/19 1318      PT LONG TERM GOAL #1   Title  Be independent with a long-term home exercise program for self-management of symptoms.    Baseline  Initial HEP provided at IE (01/08/2019);    Time  8    Period  Weeks    Status  New    Target Date  03/06/19      PT LONG TERM GOAL #2   Title  Patient will demonstrate left knee strength equal or greater 5/5 and B hip strength equal or greater than 4+/5 to demonstrate functional strength for improved gait, increased standing tolerance, decreased fall risk, increased stair climbing ability.    Baseline  see objective exam (01/08/2019);    Time  8    Period  Weeks    Status  New    Target Date  03/06/19      PT LONG TERM GOAL #3   Title  Patient will complete 5TSTS test in equal or less than 11 seconds to demonstrate improved LE strength and power for functional activities such as stairs, walking, transferring.    Baseline  16 seconds (01/08/2019);    Time  8    Period  Weeks    Status  New    Target Date  03/06/19      PT LONG TERM GOAL #4   Title  Patient will be able to heel walk x 10 steps with BUE support without buckling or knee pain to improved functional balance and LE stabilty for ambulation.    Baseline  Heel walking: two trials = L knee buckled and painful, required BUE support (01/08/2019);    Time  8    Period  Weeks    Status  New    Target Date  03/06/19      PT LONG TERM GOAL #5   Title  Patinet will demonstrate normal gait pattern to improve ambulation safety and ability during household and community ambulation.    Baseline  Gait: antalgic gait favoring L LE, lacks knee extension and flexion, toe walks on L LE, lack of hip flexion L side.    Time  8    Period  Weeks    Status  New    Target Date  03/06/19      Additional Long Term Goals   Additional Long Term Goals  Yes      PT LONG TERM GOAL #6   Title  Demonstrate improved FOTO score by 10 units to demonstrate  improvement in overall condition and self-reported functional ability.    Baseline  FOTO = 52 (01/08/2019);    Time  8    Period  Weeks    Status  New    Target Date  03/06/19      PT LONG TERM GOAL #7   Title  Complete community, work and/or recreational activities without limitation due to current condition.    Baseline  difficulty walking, with stairs, balance, walking over uneven ground, increased fall risk. (01/08/2019);    Time  8    Period  Weeks    Status  New    Target Date  03/06/19  Plan - 01/09/19 1347    Clinical Impression Statement  Patient is a 73 y.o. female referred to outpatient physical therapy with a medical diagnosis of L knee pain who presents with signs and symptoms consistent with L knee pain, stiffness, altered gait pattern, weakness, and decreased balance. Patient presents with significant  impairments that are limiting ability to complete walking, with stairs, balance, walking over uneven ground  without difficulty. Patient will benefit from skilled physical therapy intervention to address current body structure impairments and activity limitations to improve function and work towards goals set in current POC in order to return to prior level of function or maximal functional improvement.    Personal Factors and Comorbidities  Age;Comorbidity 3+;Past/Current Experience;Fitness;Time since onset of injury/illness/exacerbation    Comorbidities  frequent UTIs (comes and goes every 2-3 years), osteopenia, depression, Hx of depression, stress, hypertension, GERD.    Examination-Activity Limitations  Stairs;Stand;Bend;Sit;Lift;Caring for Others;Transfers;Squat;Carry;Other   walking on uneven ground   Examination-Participation Restrictions  Community Activity;Other;Yard Work   ifficulty walking, with stairs, balance, walking over uneven ground, increased fall risk   Stability/Clinical Decision Making  Evolving/Moderate complexity    Clinical Decision Making  Moderate     Rehab Potential  Good    PT Frequency  2x / week    PT Duration  8 weeks    PT Treatment/Interventions  ADLs/Self Care Home Management;Cryotherapy;Moist Heat;Therapeutic exercise;Gait training;Stair training;Functional mobility training;Therapeutic activities;Balance training;Neuromuscular re-education;Patient/family education;Manual techniques;Passive range of motion;Dry needling;Spinal Manipulations;Joint Manipulations;Energy conservation   joint mobilizations grades I-IV   PT Next Visit Plan  assess balance more formally    PT Home Exercise Plan  Medbridge Access Code: [email or web address]    Consulted and Agree with Plan of Care  Patient          Patient will benefit from skilled therapeutic intervention in order to improve the following deficits and impairments:  Abnormal gait, Decreased balance, Decreased endurance, Decreased mobility, Difficulty walking, Cardiopulmonary status limiting activity, Decreased range of motion, Impaired perceived functional ability, Improper body mechanics, Obesity, Pain, Decreased strength, Decreased safety awareness, Decreased coordination, Decreased activity tolerance  Visit Diagnosis: Left knee pain, unspecified chronicity  Difficulty in walking, not elsewhere classified  Muscle weakness (generalized)  Unsteadiness on feet     Problem List Patient Active Problem List   Diagnosis Date Noted  . Back pain 11/15/2018  . SOB (shortness of breath) 11/15/2018  . Decreased GFR 11/14/2018  . Hyperbilirubinemia 11/14/2018  . Abdominal bloating 12/09/2017  . Osteopenia 04/29/2017  . Hyperglycemia 04/27/2017  . Reflux esophagitis   . Heartburn   . Special screening for malignant neoplasms, colon   . Hair loss 10/26/2015  . Fatigue 10/21/2015  . Vitamin D deficiency 03/15/2015  . Weight loss counseling, encounter for 02/22/2015  . Facial erythema 07/27/2014  . Health care maintenance 07/05/2014  . BP (high blood pressure) 03/26/2014  . Headache  03/08/2014  . Eyelid lesion 03/08/2014  . Shingles 11/16/2013  . Light headedness 06/22/2013  . Cough 05/18/2013  . GERD (gastroesophageal reflux disease) 05/18/2013  . Swelling of extremity 03/01/2013  . Left knee pain 03/01/2013  . Essential hypertension, benign 12/15/2012  . Frequent UTI 12/15/2012  . Depression 12/15/2012  . Environmental allergies 12/15/2012    Luretha MurphySara R. Ilsa IhaSnyder, PT, DPT 01/09/19, 3:23 PM  [facility] [address] [place], [place], [address] Phone: [phone]   Fax:  [phone]  Name: [name] MRN: [id number] Date of Birth: August 09, 1945

## 2019-01-12 ENCOUNTER — Encounter: Payer: Self-pay | Admitting: Internal Medicine

## 2019-01-12 NOTE — Progress Notes (Signed)
[name], female   DOB: [birth year], 73 y.o.   MRN: [id number]   Virtual Visit via telephone Note  This visit type was conducted due to national recommendations for restrictions regarding the COVID-19 pandemic (e.g. social distancing).  This format is felt to be most appropriate for this patient at this time.  All issues noted in this document were discussed and addressed.  No physical exam was performed (except for noted visual exam findings with Video Visits).   I connected with Lilyan PuntLinda Brigante by telephone and verified that I am speaking with the correct person using two identifiers. Location patient: home Location provider: work Persons participating in the telephone visit: patient, provider  I discussed the limitations, risks, security and privacy concerns of performing an evaluation and management service by telephone and the availability of in person appointments. The patient expressed understanding and agreed to proceed.   Reason for visit: acute visit  HPI: Reports she started having symptoms several days ago.  Noticed some increased frequency and urgency.  Has been taking AZO.  No fever.  No abdominal pain.  No back pain.  Bowels moving.  Eating.  No nausea.  Drinking. Has had several UTIs in the past year.  Discussed further evaluation and w/up given recurring issues.  Pt desires urology referral.      ROS: See pertinent positives and negatives per HPI.  Past Medical History:  Diagnosis Date  . Allergy   . Depression   . History of chicken pox   . Hx: UTI (urinary tract infection)   . Hypertension     Past Surgical History:  Procedure Laterality Date  . COLONOSCOPY WITH PROPOFOL N/A 06/20/2016   Procedure: COLONOSCOPY WITH PROPOFOL;  Surgeon: Midge Miniumarren Wohl, MD;  Location: [facility] ENDOSCOPY;  Service: Endoscopy;  Laterality: N/A;  . ESOPHAGOGASTRODUODENOSCOPY (EGD) WITH PROPOFOL N/A 06/20/2016   Procedure: ESOPHAGOGASTRODUODENOSCOPY (EGD) WITH PROPOFOL;  Surgeon:  Midge Miniumarren Wohl, MD;  Location: [facility] ENDOSCOPY;  Service: Endoscopy;  Laterality: N/A;  . TONSILLECTOMY     as a child    Family History  Problem Relation Age of Onset  . Hypertension Mother   . Cancer Sister        colon cancer  . Hypertension Sister   . Diabetes Sister   . Hypertension Brother   . Hypertension Maternal Aunt   . Breast cancer Maternal Aunt 60  . Hypertension Maternal Uncle   . Stroke Maternal Grandmother   . Hypertension Maternal Grandmother     SOCIAL HX: reviewed.    Current Outpatient Medications:  .  lisinopril-hydrochlorothiazide (ZESTORETIC) 10-12.5 MG tablet, TAKE 1 TABLET EVERY DAY, Disp: 90 tablet, Rfl: 1 .  sertraline (ZOLOFT) 25 MG tablet, TAKE 1 TABLET EVERY DAY, Disp: 90 tablet, Rfl: 1 .  cefdinir (OMNICEF) 250 MG/5ML suspension, Take 6 mLs (300 mg total) by mouth 2 (two) times daily. For 5 days, Disp: 60 mL, Rfl: 0 .  esomeprazole (NEXIUM) 40 MG capsule, TAKE 1 CAPSULE DAILY BEFORE BREAKFAST, Disp: 90 capsule, Rfl: 0  EXAM:  GENERAL: alert.  Answering questions appropriately.  Sounds to be in no acute distress.    PSYCH/NEURO: pleasant and cooperative, no obvious depression or anxiety, speech and thought processing grossly intact  ASSESSMENT AND PLAN:  Discussed the following assessment and plan:  Frequent UTI Recurring symptoms as outlined.  Urinalysis with small leukocytes, 11-20 wbcs.  Treat for UTI. Send culture.  Given recurring issues, will refer to urology.  Pt agreeable.  I discussed the assessment and treatment plan with the patient. The patient was provided an opportunity to ask questions and all were answered. The patient agreed with the plan and demonstrated an understanding of the instructions.   The patient was advised to call back or seek an in-person evaluation if the symptoms worsen or if the condition fails to improve as anticipated.  I provided 15 minutes of non-face-to-face time during this encounter.   Einar Pheasant, MD

## 2019-01-12 NOTE — Assessment & Plan Note (Signed)
Recurring symptoms as outlined.  Urinalysis with small leukocytes, 11-20 wbcs.  Treat for UTI. Send culture.  Given recurring issues, will refer to urology.  Pt agreeable.

## 2019-01-15 ENCOUNTER — Other Ambulatory Visit: Payer: Self-pay

## 2019-01-15 ENCOUNTER — Encounter: Payer: Self-pay | Admitting: Physical Therapy

## 2019-01-15 ENCOUNTER — Ambulatory Visit: Payer: Medicare HMO

## 2019-01-15 DIAGNOSIS — R2681 Unsteadiness on feet: Secondary | ICD-10-CM

## 2019-01-15 DIAGNOSIS — M25562 Pain in left knee: Secondary | ICD-10-CM | POA: Diagnosis not present

## 2019-01-15 DIAGNOSIS — R262 Difficulty in walking, not elsewhere classified: Secondary | ICD-10-CM

## 2019-01-15 DIAGNOSIS — M6281 Muscle weakness (generalized): Secondary | ICD-10-CM

## 2019-01-15 NOTE — Therapy (Signed)
[facility] [address], [place], [address] Phone: [phone]   Fax:  [phone]  Physical Therapy Treatment  Patient Details  Name: Susan Miller MRN: [id number] Date of Birth: [birth year] Referring Provider (PT): Einar Pheasant, MD   Encounter Date: 01/15/2019  PT End of Session - 01/15/19 1029    Visit Number  2    Number of Visits  16    Date for PT Re-Evaluation  03/06/19    Authorization Type  [employer] reporting period from 01/08/2019    Authorization Time Period  current cert period: 6/0/1093 - 03/06/2019 (latest PN: [facility] 01/08/2019)    Authorization - Visit Number  2    Authorization - Number of Visits  10    PT Start Time  1030    PT Stop Time  1115    PT Time Calculation (min)  45 min    Activity Tolerance  Patient tolerated treatment well    Behavior During Therapy  [facility] for tasks assessed/performed       Past Medical History:  Diagnosis Date  . Allergy   . Depression   . History of chicken pox   . Hx: UTI (urinary tract infection)   . Hypertension     Past Surgical History:  Procedure Laterality Date  . COLONOSCOPY WITH PROPOFOL N/A 06/20/2016   Procedure: COLONOSCOPY WITH PROPOFOL;  Surgeon: Lucilla Lame, MD;  Location: [facility];  Service: Endoscopy;  Laterality: N/A;  . ESOPHAGOGASTRODUODENOSCOPY (EGD) WITH PROPOFOL N/A 06/20/2016   Procedure: ESOPHAGOGASTRODUODENOSCOPY (EGD) WITH PROPOFOL;  Surgeon: Lucilla Lame, MD;  Location: [facility];  Service: Endoscopy;  Laterality: N/A;  . TONSILLECTOMY     as a child    There were no vitals filed for this visit.  Subjective Assessment - 01/15/19 1035    Subjective  Patient reports L knee pain located inside of her L knee and rated 3/10. Patient states increased pain may due to HEP and yard work yesterday. Patient also reports having allergy reactions due to working in the bushes in her backyard since yesterday.    Pertinent  History  Patient is a 73 y.o. female who presents to outpatient physical therapy with a referral for medical diagnosis L knee pain. This patient's chief complaints consist of L knee pain, stiffness, limping during walking leading to the following functional deficits: difficulty walking, with stairs, balance, walking over uneven ground, increased fall risk. Relevant past medical history and comorbidities include frequent UTIs (comes and goes every 2-3 years), osteopenia, depression, Hx of depression, stress, hypertension, GERD.    Limitations  Walking;Standing;House hold activities;Other (comment)   stairs   Diagnostic tests  radiograph report  L knee (12/24/2018): "FINDINGS:No evidence of fracture, dislocation, or joint effusion. No evidence of arthropathy or other focal bone abnormality. Soft tissues are unremarkable."    Patient Stated Goals  "stop limping"    Currently in Pain?  Yes    Pain Score  3     Pain Location  Knee    Pain Orientation  Left;Anterior    Pain Descriptors / Indicators  Sharp    Pain Type  Chronic pain    Pain Onset  More than a month ago       Therapeutic exercise:to centralize symptoms and improve ROM, strength, muscular endurance, and activity tolerance required for successful completion of functional activities.    - seated bike for warm up, seat at 8 x26mins (unbilled) - sit <> stand from  chair without back support  - shoulder extension to 90 degree with elbow extended x10  - arms cross over chest x 10   - green youth ball between B knees x10    - without green ball but added red theraband above knee around distal thigh 2x10  Patient reports no increase of pain and states with the added props able to feel more BLE muscle activation. Cuing provided for keep feet apart at shoulder length.  - LE strengthening (added to HEP) with mirror in front   - hip abduction R/L 3 x 10 3 s hold with BUE hand over hand support on treadmill bar   - hip extension R/L  x 10 3s hold   with BUE hand over hand support on treadmill bar Cuing provided for pelvic alignment and upper torso standing through midline - Education and updated on HEP including handout   Neuromuscular Re-education: to improve, balance, postural strength, muscle activation patterns, and stabilization strength required for functional activities:  SBA provided.  - Standing feet together able to maintain balance 30s with eyes open/close.Patient states increased difficulty when eyes closed when standing with narrow base. Mild postural sway noted.  - Single leg stand on R/L LE. Only able to stand less than 2s then pt requires hand support to keep balance.  - Tandem standing:   - L in front of R foot normal   - R foot in front of L foot. Patient able to maintain balance but less than 10s     Patient tolerated the session well today and states no pain at L knee at the end of the treatment. Hip drop when single leg standing on R/L showing weakness and lack of gluteal activation at bilateral hip. Balance assessed and patient presents with difficulties with single leg standing on both side. HEP updated and handout provided. Patient will benefit continued skilled physical therapy for strengthening and balance to reduce fall risk and improve functional mobility.     HEP: Access Code: [email or web address]  URL: [email or web address]  Date: 01/15/2019  Prepared by: Olga Coasteriana Campbell   Exercises Seated Long Arc Quad - 1 sets - 10 reps - 1x daily - 7x weekly Sit to Stand without Arm Support - 3 sets - 10 reps - 1x daily - 7x weekly Standing Hip Abduction with Counter Support - 10 reps - 3 sets - 3 hold - 1x daily - 7x weekly Standing Hip Extension with Counter Support - 10 reps - 3 sets - 3 hold - 1x daily - 7x weekly     PT Short Term Goals - 01/09/19 1317      PT SHORT TERM GOAL #1   Title  Be independent with initial home exercise program for self-management of symptoms.    Baseline  provided with  initial HEP (01/08/2019);    Time  2    Period  Weeks    Status  New    Target Date  01/23/19        PT Long Term Goals - 01/09/19 1318      PT LONG TERM GOAL #1   Title  Be independent with a long-term home exercise program for self-management of symptoms.    Baseline  Initial HEP provided at IE (01/08/2019);    Time  8    Period  Weeks    Status  New    Target Date  03/06/19      PT LONG TERM GOAL #2   Title  Patient will demonstrate  left knee strength equal or greater 5/5 and B hip strength equal or greater than 4+/5 to demonstrate functional strength for improved gait, increased standing tolerance, decreased fall risk, increased stair climbing ability.    Baseline  see objective exam (01/08/2019);    Time  8    Period  Weeks    Status  New    Target Date  03/06/19      PT LONG TERM GOAL #3   Title  Patient will complete 5TSTS test in equal or less than 11 seconds to demonstrate improved LE strength and power for functional activities such as stairs, walking, transferring.    Baseline  16 seconds (01/08/2019);    Time  8    Period  Weeks    Status  New    Target Date  03/06/19      PT LONG TERM GOAL #4   Title  Patient will be able to heel walk x 10 steps with BUE support without buckling or knee pain to improved functional balance and LE stabilty for ambulation.    Baseline  Heel walking: two trials = L knee buckled and painful, required BUE support (01/08/2019);    Time  8    Period  Weeks    Status  New    Target Date  03/06/19      PT LONG TERM GOAL #5   Title  Patinet will demonstrate normal gait pattern to improve ambulation safety and ability during household and community ambulation.    Baseline  Gait: antalgic gait favoring L LE, lacks knee extension and flexion, toe walks on L LE, lack of hip flexion L side.    Time  8    Period  Weeks    Status  New    Target Date  03/06/19      Additional Long Term Goals   Additional Long Term Goals  Yes      PT LONG TERM  GOAL #6   Title  Demonstrate improved FOTO score by 10 units to demonstrate improvement in overall condition and self-reported functional ability.    Baseline  FOTO = 52 (01/08/2019);    Time  8    Period  Weeks    Status  New    Target Date  03/06/19      PT LONG TERM GOAL #7   Title  Complete community, work and/or recreational activities without limitation due to current condition.    Baseline  difficulty walking, with stairs, balance, walking over uneven ground, increased fall risk. (01/08/2019);    Time  8    Period  Weeks    Status  New    Target Date  03/06/19            Plan - 01/15/19 1134    Clinical Impression Statement  Patient tolerated the session well today and states no pain at L knee at the end of the treatment. Hip drop when single leg standing on R/L showing weakness and lack of gluteal activation at bilateral hip. Balance assessed and patient presents with difficulties with single leg standing on both side. HEP updated and provided handout. Patient will benefit continued skilled physical therapy for strengthening and balance to reduce fall risk and improve functional mobility.    Personal Factors and Comorbidities  Age;Comorbidity 3+;Past/Current Experience;Fitness;Time since onset of injury/illness/exacerbation    Comorbidities  frequent UTIs (comes and goes every 2-3 years), osteopenia, depression, Hx of depression, stress, hypertension, GERD.    Examination-Activity Limitations  Stairs;Stand;Bend;Sit;Lift;Caring for Others;Transfers;Squat;Carry;Other   walking on uneven ground   Examination-Participation Restrictions  Community Activity;Other;Yard Work   ifficulty walking, with stairs, balance, walking over uneven ground, increased fall risk   Stability/Clinical Decision Making  Evolving/Moderate complexity    Rehab Potential  Good    PT Frequency  2x / week    PT Duration  8 weeks    PT Treatment/Interventions  ADLs/Self Care Home Management;Cryotherapy;Moist  Heat;Therapeutic exercise;Gait training;Stair training;Functional mobility training;Therapeutic activities;Balance training;Neuromuscular re-education;Patient/family education;Manual techniques;Passive range of motion;Dry needling;Spinal Manipulations;Joint Manipulations;Energy conservation   joint mobilizations grades I-IV   PT Next Visit Plan  assess balance more formally    PT Home Exercise Plan  Medbridge Access Code: [email or web address]    Consulted and Agree with Plan of Care  Patient       Patient will benefit from skilled therapeutic intervention in order to improve the following deficits and impairments:  Abnormal gait, Decreased balance, Decreased endurance, Decreased mobility, Difficulty walking, Cardiopulmonary status limiting activity, Decreased range of motion, Impaired perceived functional ability, Improper body mechanics, Obesity, Pain, Decreased strength, Decreased safety awareness, Decreased coordination, Decreased activity tolerance  Visit Diagnosis: Left knee pain, unspecified chronicity  Muscle weakness (generalized)  Difficulty in walking, not elsewhere classified  Unsteadiness on feet     Problem List Patient Active Problem List   Diagnosis Date Noted  . Back pain 11/15/2018  . SOB (shortness of breath) 11/15/2018  . Decreased GFR 11/14/2018  . Hyperbilirubinemia 11/14/2018  . Abdominal bloating 12/09/2017  . Osteopenia 04/29/2017  . Hyperglycemia 04/27/2017  . Reflux esophagitis   . Heartburn   . Special screening for malignant neoplasms, colon   . Hair loss 10/26/2015  . Fatigue 10/21/2015  . Vitamin D deficiency 03/15/2015  . Weight loss counseling, encounter for 02/22/2015  . Facial erythema 07/27/2014  . Health care maintenance 07/05/2014  . BP (high blood pressure) 03/26/2014  . Headache 03/08/2014  . Eyelid lesion 03/08/2014  . Shingles 11/16/2013  . Light headedness 06/22/2013  . Cough 05/18/2013  . GERD (gastroesophageal reflux disease) 05/18/2013   . Swelling of extremity 03/01/2013  . Left knee pain 03/01/2013  . Essential hypertension, benign 12/15/2012  . Frequent UTI 12/15/2012  . Depression 12/15/2012  . Environmental allergies 12/15/2012    Nelly Rout, SPT 01/15/19, 11:49 AM    [facility] [address] [place], [address] Phone: [phone]   Fax:  [phone]  Name: Susan Miller MRN: [id number] Date of Birth: 1946-04-30

## 2019-01-20 ENCOUNTER — Encounter: Payer: Self-pay | Admitting: Physical Therapy

## 2019-01-20 ENCOUNTER — Ambulatory Visit: Payer: Medicare HMO | Admitting: Physical Therapy

## 2019-01-20 ENCOUNTER — Other Ambulatory Visit: Payer: Self-pay

## 2019-01-20 DIAGNOSIS — M25562 Pain in left knee: Secondary | ICD-10-CM | POA: Diagnosis not present

## 2019-01-20 DIAGNOSIS — R2681 Unsteadiness on feet: Secondary | ICD-10-CM

## 2019-01-20 DIAGNOSIS — M6281 Muscle weakness (generalized): Secondary | ICD-10-CM | POA: Diagnosis not present

## 2019-01-20 DIAGNOSIS — R262 Difficulty in walking, not elsewhere classified: Secondary | ICD-10-CM | POA: Diagnosis not present

## 2019-01-20 NOTE — Therapy (Signed)
[facility] [address] [place], [address] Phone: [phone]   Fax:  [phone]  Physical Therapy Treatment  Patient Details  Name: Susan Miller MRN: [id number] Date of Birth: 1946-03-01 Referring Provider (PT): Dale [place], MD   Encounter Date: 01/20/2019  PT End of Session - 01/20/19 1115    Visit Number  3    Number of Visits  16    Date for PT Re-Evaluation  03/06/19    Authorization Type  [employer] reporting period from 01/08/2019    Authorization Time Period  current cert period: 01/08/2019 - 03/06/2019 (latest PN: IE 01/08/2019)    Authorization - Visit Number  3    Authorization - Number of Visits  10    PT Start Time  1115    PT Stop Time  1200    PT Time Calculation (min)  45 min    Activity Tolerance  Patient tolerated treatment well    Behavior During Therapy  [facility] for tasks assessed/performed       Past Medical History:  Diagnosis Date  . Allergy   . Depression   . History of chicken pox   . Hx: UTI (urinary tract infection)   . Hypertension     Past Surgical History:  Procedure Laterality Date  . COLONOSCOPY WITH PROPOFOL N/A 06/20/2016   Procedure: COLONOSCOPY WITH PROPOFOL;  Surgeon: Midge Minium, MD;  Location: [facility] ENDOSCOPY;  Service: Endoscopy;  Laterality: N/A;  . ESOPHAGOGASTRODUODENOSCOPY (EGD) WITH PROPOFOL N/A 06/20/2016   Procedure: ESOPHAGOGASTRODUODENOSCOPY (EGD) WITH PROPOFOL;  Surgeon: Midge Minium, MD;  Location: [facility] ENDOSCOPY;  Service: Endoscopy;  Laterality: N/A;  . TONSILLECTOMY     as a child    There were no vitals filed for this visit.  Subjective Assessment - 01/20/19 1115    Subjective  Patient reports 2/10 at her L knee. She also reports moderate soreness at both of her LE and difficulties sitting down to toliet after previous sessions. Patient states she felt stronger than last time. Patient presents a good motivation towards physical therapy  session.    Pertinent History  Patient is a 73 y.o. female who presents to outpatient physical therapy with a referral for medical diagnosis L knee pain. This patient's chief complaints consist of L knee pain, stiffness, limping during walking leading to the following functional deficits: difficulty walking, with stairs, balance, walking over uneven ground, increased fall risk. Relevant past medical history and comorbidities include frequent UTIs (comes and goes every 2-3 years), osteopenia, depression, Hx of depression, stress, hypertension, GERD.    Limitations  Walking;Standing;House hold activities;Other (comment)   stairs   Diagnostic tests  radiograph report  L knee (12/24/2018): "FINDINGS:No evidence of fracture, dislocation, or joint effusion. No evidence of arthropathy or other focal bone abnormality. Soft tissues are unremarkable."    Patient Stated Goals  "stop limping"    Currently in Pain?  Yes    Pain Score  2     Pain Location  Knee    Pain Orientation  Left;Anterior    Pain Descriptors / Indicators  Sore    Pain Type  Chronic pain    Pain Onset  More than a month ago       Therapeutic exercise: to centralize symptoms and improve ROM, strength, muscular endurance, and activity tolerance required for successful completion of functional activities.  - seated bike for warm up, seat at 8 x . For improved lower extremity mobility, muscular  endurance, and weightbearing activity tolerance; and to induce the analgesic effect of aerobic exercise, stimulate improved joint nutrition, and prepare body structures and systems for following interventions. - sit <> stand from chair without back support             - shoulder extension to 90 degree with elbow extended x10             - arms cross over chest                           - green youth ball between B knees x10                          - without green ball but added red theraband above knee around distal thigh 2x10  Cuing provided  for appropriate foot distance to improve body mechanics and muscle activations for strengthening, balance as well as pain control.  - LE strengthening (added to HEP) with mirror in front              - hip abduction R/L 1 x 10 3 s hold with BUE hand over hand support on treadmill bar              - hip extension R/L 1 x 10 3s hold with BUE hand over hand support on treadmill bar Cuing provided for pelvic alignment and upper torso standing through midline   Neuromuscular Re-education: to improve, balance, postural strength, muscle activation patterns, and stabilization strength required for functional activities: SBA provided.  - Single leg stand on R/L LE. 2x30s each side with one hand support over treadmill bar.  - Tandem standing:              - L in front of R foot normal 2x30s              - R foot in front of L foot. 2x30s with one hand support over treadmill bar. - Ambulation heel to toe (forward-backward) 62feet x 5 reps. With one hand assist on treadmill bar. Mild to moderate postural sway and used hand assist on bar heavily to recover balance.   HOME EXERCISE PROGRAM HEP: Access Code: [email or web address]        URL: [email or web address]    Date: 01/15/2019  Prepared by: Lieutenant Diego    Exercises Seated Long Arc Quad - 1 sets - 10 reps - 1x daily - 7x weekly Sit to Stand without Arm Support - 3 sets - 10 reps - 1x daily - 7x weekly Standing Hip Abduction with Counter Support - 10 reps - 3 sets - 3 hold - 1x daily - 7x weekly Standing Hip Extension with Counter Support - 10 reps - 3 sets - 3 hold - 1x daily - 7x weekly      Patient tolerated the session well today and states mild soreness at L knee at the end of the treatment. Patient demonstrated increased LE strengthening with sit to stand exercise. Patient also showing increased muscle control in speed and postural stability. However, patient still presents with impaired activity tolerance due to lack of muscle endurance,  strength and balance with higher level challenging exercises. Will focus on muscle strength, and balance training in later session. Patient will benefit continued skilled physical therapy for strengthening and balance to reduce fall risk and improve functional mobility.       PT Education - 01/20/19 1115  Education Details  Exercise purpose/form    Person(s) Educated  Patient    Methods  Explanation;Demonstration;Tactile cues;Verbal cues    Comprehension  Verbalized understanding;Returned demonstration;Verbal cues required;Tactile cues required       PT Short Term Goals - 01/09/19 1317      PT SHORT TERM GOAL #1   Title  Be independent with initial home exercise program for self-management of symptoms.    Baseline  provided with initial HEP (01/08/2019);    Time  2    Period  Weeks    Status  New    Target Date  01/23/19        PT Long Term Goals - 01/09/19 1318      PT LONG TERM GOAL #1   Title  Be independent with a long-term home exercise program for self-management of symptoms.    Baseline  Initial HEP provided at IE (01/08/2019);    Time  8    Period  Weeks    Status  New    Target Date  03/06/19      PT LONG TERM GOAL #2   Title  Patient will demonstrate left knee strength equal or greater 5/5 and B hip strength equal or greater than 4+/5 to demonstrate functional strength for improved gait, increased standing tolerance, decreased fall risk, increased stair climbing ability.    Baseline  see objective exam (01/08/2019);    Time  8    Period  Weeks    Status  New    Target Date  03/06/19      PT LONG TERM GOAL #3   Title  Patient will complete 5TSTS test in equal or less than 11 seconds to demonstrate improved LE strength and power for functional activities such as stairs, walking, transferring.    Baseline  16 seconds (01/08/2019);    Time  8    Period  Weeks    Status  New    Target Date  03/06/19      PT LONG TERM GOAL #4   Title  Patient will be able to heel  walk x 10 steps with BUE support without buckling or knee pain to improved functional balance and LE stabilty for ambulation.    Baseline  Heel walking: two trials = L knee buckled and painful, required BUE support (01/08/2019);    Time  8    Period  Weeks    Status  New    Target Date  03/06/19      PT LONG TERM GOAL #5   Title  Patinet will demonstrate normal gait pattern to improve ambulation safety and ability during household and community ambulation.    Baseline  Gait: antalgic gait favoring L LE, lacks knee extension and flexion, toe walks on L LE, lack of hip flexion L side.    Time  8    Period  Weeks    Status  New    Target Date  03/06/19      Additional Long Term Goals   Additional Long Term Goals  Yes      PT LONG TERM GOAL #6   Title  Demonstrate improved FOTO score by 10 units to demonstrate improvement in overall condition and self-reported functional ability.    Baseline  FOTO = 52 (01/08/2019);    Time  8    Period  Weeks    Status  New    Target Date  03/06/19      PT LONG TERM GOAL #7  Title  Complete community, work and/or recreational activities without limitation due to current condition.    Baseline  difficulty walking, with stairs, balance, walking over uneven ground, increased fall risk. (01/08/2019);    Time  8    Period  Weeks    Status  New    Target Date  03/06/19            Plan - 01/20/19 1116    Clinical Impression Statement  Patient tolerated the session well today and states mild soreness at L knee at the end of the treatment. Patient demonstrated increased LE strengthening with sit to stand exercise. Patient also showing increased muscle control in speed and postural stability. However, patient still presents with impaired activity tolerance due to lack of muscle endurance, strength and balance with higher level challenging exercises. Will focus on muscle strength, and balance training in later session. Patient will benefit continued skilled  physical therapy for strengthening and balance to reduce fall risk and improve functional mobility.    Personal Factors and Comorbidities  Age;Comorbidity 3+;Past/Current Experience;Fitness;Time since onset of injury/illness/exacerbation    Comorbidities  frequent UTIs (comes and goes every 2-3 years), osteopenia, depression, Hx of depression, stress, hypertension, GERD.    Examination-Activity Limitations  Stairs;Stand;Bend;Sit;Lift;Caring for Others;Transfers;Squat;Carry;Other   walking on uneven ground   Examination-Participation Restrictions  Community Activity;Other;Yard Work   ifficulty walking, with stairs, balance, walking over uneven ground, increased fall risk   Stability/Clinical Decision Making  Evolving/Moderate complexity    Rehab Potential  Good    PT Frequency  2x / week    PT Duration  8 weeks    PT Treatment/Interventions  ADLs/Self Care Home Management;Cryotherapy;Moist Heat;Therapeutic exercise;Gait training;Stair training;Functional mobility training;Therapeutic activities;Balance training;Neuromuscular re-education;Patient/family education;Manual techniques;Passive range of motion;Dry needling;Spinal Manipulations;Joint Manipulations;Energy conservation   joint mobilizations grades I-IV   PT Next Visit Plan  assess balance more formally    PT Home Exercise Plan  Medbridge Access Code: [email or web address]    Consulted and Agree with Plan of Care  Patient       Patient will benefit from skilled therapeutic intervention in order to improve the following deficits and impairments:  Abnormal gait, Decreased balance, Decreased endurance, Decreased mobility, Difficulty walking, Cardiopulmonary status limiting activity, Decreased range of motion, Impaired perceived functional ability, Improper body mechanics, Obesity, Pain, Decreased strength, Decreased safety awareness, Decreased coordination, Decreased activity tolerance  Visit Diagnosis: Left knee pain, unspecified chronicity  Muscle  weakness (generalized)  Difficulty in walking, not elsewhere classified  Unsteadiness on feet     Problem List Patient Active Problem List   Diagnosis Date Noted  . Back pain 11/15/2018  . SOB (shortness of breath) 11/15/2018  . Decreased GFR 11/14/2018  . Hyperbilirubinemia 11/14/2018  . Abdominal bloating 12/09/2017  . Osteopenia 04/29/2017  . Hyperglycemia 04/27/2017  . Reflux esophagitis   . Heartburn   . Special screening for malignant neoplasms, colon   . Hair loss 10/26/2015  . Fatigue 10/21/2015  . Vitamin D deficiency 03/15/2015  . Weight loss counseling, encounter for 02/22/2015  . Facial erythema 07/27/2014  . Health care maintenance 07/05/2014  . BP (high blood pressure) 03/26/2014  . Headache 03/08/2014  . Eyelid lesion 03/08/2014  . Shingles 11/16/2013  . Light headedness 06/22/2013  . Cough 05/18/2013  . GERD (gastroesophageal reflux disease) 05/18/2013  . Swelling of extremity 03/01/2013  . Left knee pain 03/01/2013  . Essential hypertension, benign 12/15/2012  . Frequent UTI 12/15/2012  . Depression 12/15/2012  . Environmental allergies 12/15/2012  Nelly Rout, SPT 01/20/19, 5:11 PM  [facility] PH[facility] SPORTS MEDICINE [address], [place], [address] Phone: [phone]   Fax:  [phone]  Name: FERNANDE TREIBER MRN: [id number] Date of Birth: [birth year]

## 2019-01-22 ENCOUNTER — Ambulatory Visit: Payer: Medicare HMO | Admitting: Physical Therapy

## 2019-01-22 ENCOUNTER — Other Ambulatory Visit: Payer: Self-pay

## 2019-01-22 ENCOUNTER — Encounter: Payer: Self-pay | Admitting: Physical Therapy

## 2019-01-22 DIAGNOSIS — M25562 Pain in left knee: Secondary | ICD-10-CM | POA: Diagnosis not present

## 2019-01-22 DIAGNOSIS — R262 Difficulty in walking, not elsewhere classified: Secondary | ICD-10-CM | POA: Diagnosis not present

## 2019-01-22 DIAGNOSIS — M6281 Muscle weakness (generalized): Secondary | ICD-10-CM

## 2019-01-22 DIAGNOSIS — R2681 Unsteadiness on feet: Secondary | ICD-10-CM | POA: Diagnosis not present

## 2019-01-22 NOTE — Therapy (Signed)
[place] [facility] PH[facility] AND SPORTS MEDICINE [address] [place], [place], [id number] Phone: [phone]   Fax:  [phone]  Physical Therapy Treatment  Patient Details  Name: Susan Miller MRN: [id number] Date of Birth: [birth year] Referring Provider (PT): Dale DurhamScott, Charlene, MD   Encounter Date: 01/22/2019  PT End of Session - 01/22/19 1129    Visit Number  4    Number of Visits  16    Date for PT Re-Evaluation  03/06/19    Authorization Type  [employer] reporting period from 01/08/2019    Authorization Time Period  current cert period: 01/08/2019 - 03/06/2019 (latest PN: IE 01/08/2019)    Authorization - Visit Number  4    Authorization - Number of Visits  10    PT Start Time  1119    PT Stop Time  1200    PT Time Calculation (min)  41 min    Activity Tolerance  Patient tolerated treatment well    Behavior During Therapy  [facility] for tasks assessed/performed       Past Medical History:  Diagnosis Date  . Allergy   . Depression   . History of chicken pox   . Hx: UTI (urinary tract infection)   . Hypertension     Past Surgical History:  Procedure Laterality Date  . COLONOSCOPY WITH PROPOFOL N/A 06/20/2016   Procedure: COLONOSCOPY WITH PROPOFOL;  Surgeon: Midge Miniumarren Wohl, MD;  Location: [facility] ENDOSCOPY;  Service: Endoscopy;  Laterality: N/A;  . ESOPHAGOGASTRODUODENOSCOPY (EGD) WITH PROPOFOL N/A 06/20/2016   Procedure: ESOPHAGOGASTRODUODENOSCOPY (EGD) WITH PROPOFOL;  Surgeon: Midge Miniumarren Wohl, MD;  Location: [facility] ENDOSCOPY;  Service: Endoscopy;  Laterality: N/A;  . TONSILLECTOMY     as a child    There were no vitals filed for this visit.  Subjective Assessment - 01/22/19 1125    Subjective  Patient reports 2/10 at her L knee and occasionally sharp pain without specific motion/triggers. Patient has been doing her home exercise programs and no recent falls. Mild soreness after last session and patient is looking forward to having her PT session today.     Pertinent History  Patient is a 73 y.o. female who presents to outpatient physical therapy with a referral for medical diagnosis L knee pain. This patient's chief complaints consist of L knee pain, stiffness, limping during walking leading to the following functional deficits: difficulty walking, with stairs, balance, walking over uneven ground, increased fall risk. Relevant past medical history and comorbidities include frequent UTIs (comes and goes every 2-3 years), osteopenia, depression, Hx of depression, stress, hypertension, GERD.    Limitations  Walking;Standing;House hold activities;Other (comment)   stairs   Diagnostic tests  radiograph report  L knee (12/24/2018): "FINDINGS:No evidence of fracture, dislocation, or joint effusion. No evidence of arthropathy or other focal bone abnormality. Soft tissues are unremarkable."    Patient Stated Goals  "stop limping"    Currently in Pain?  Yes    Pain Score  2     Pain Location  Knee    Pain Orientation  Left;Anterior    Pain Descriptors / Indicators  Discomfort;Constant;Sore    Pain Type  Chronic pain    Pain Onset  More than a month ago       Therapeutic exercise: to centralize symptoms and improve ROM, strength, muscular endurance, and activity tolerance required for successful completion of functional activities.  - seated bike for warm up, seat at 8 x 6mins. For improved lower extremity mobility, muscular endurance;  and to induce the analgesic effect of aerobic exercise, improve activity tolerance, stimulate improved joint nutrition, and prepare body structures and systems for following interventions. - sit <> stand from chair without back support             - arms cross over chest  - x10 reps with minimal knee valgus observed.                         - red theraband above knee around distal thigh 1x10                         - No theraband, bilateral foot on airex 1x9 with CGA to minimal A   Cuing provided for appropriate foot  distance, knee position to improve body mechanics and muscle activations for strengthening, balance as well as pain control.    In standing with CGA provided: - LE strengthening with red theraband above knee             - hip abduction R/L 2 x 10 3 s hold with BUE hand over hand support on treadmill bar              - hip extension R/L 2 x 10 3s hold with BUE hand over hand support on treadmill bar Cuing provided for pelvic alignment and upper torso standing through midline   Neuromuscular Re-education: to improve, balance, postural strength, muscle activation patterns, and stabilization strength required for functional activities: CGA to SBA provided.  - Single leg stand on R/L LE. 2x40s each side with one hand support over treadmill bar.  - Tandem standing:              - R foot in front of L foot. 2x40s with one hand support over treadmill bar. - Ambulation heel to toe (forward-backward) 8 feet x (3 reps + 3 reps on airex). With one hand assist on treadmill bar intermittently. Mild to moderate postural sway more on airex, but used less hand assist on bar to recover balance overall.     HOME EXERCISE PROGRAM HEP: Access Code: [email or web address]        URL: [email or web address]    Date: 01/15/2019  Prepared by: Lieutenant Diego    Exercises Seated Long Arc Quad - 1 sets - 10 reps - 1x daily - 7x weekly Sit to Stand without Arm Support - 3 sets - 10 reps - 1x daily - 7x weekly (with red band above knee) (updated 01/22/2019) Standing Hip Abduction with Counter Support - 10 reps - 3 sets - 3 hold - 1x daily - 7x weekly Standing Hip Extension with Counter Support - 10 reps - 3 sets - 3 hold - 1x daily - 7x weekly       Patient tolerated the session well today with improved both static and dynamic balance. Patient demonstrated increased BLE knee stability with sit to stand exercise. Patient also showing reduced hand assistant for higher level balance tasks. However, patient still  presents with impaired activity tolerance due to lack of muscle endurance, strength and balance with higher level challenging exercises. Will focus on muscle strength, and balance training in later session. Patient will benefit continued skilled physical therapy for strengthening and balance to reduce fall risk and improve functional mobility.        PT Education - 01/22/19 1129    Education Details  Exercise purpose/form    Person(s) Educated  Patient  Methods  Explanation;Demonstration;Tactile cues;Verbal cues    Comprehension  Verbalized understanding;Returned demonstration;Verbal cues required;Tactile cues required       PT Short Term Goals - 01/22/19 1329      PT SHORT TERM GOAL #1   Title  Be independent with initial home exercise program for self-management of symptoms.    Baseline  provided with initial HEP (01/08/2019);    Time  2    Period  Weeks    Status  Achieved    Target Date  01/23/19        PT Long Term Goals - 01/09/19 1318      PT LONG TERM GOAL #1   Title  Be independent with a long-term home exercise program for self-management of symptoms.    Baseline  Initial HEP provided at IE (01/08/2019);    Time  8    Period  Weeks    Status  New    Target Date  03/06/19      PT LONG TERM GOAL #2   Title  Patient will demonstrate left knee strength equal or greater 5/5 and B hip strength equal or greater than 4+/5 to demonstrate functional strength for improved gait, increased standing tolerance, decreased fall risk, increased stair climbing ability.    Baseline  see objective exam (01/08/2019);    Time  8    Period  Weeks    Status  New    Target Date  03/06/19      PT LONG TERM GOAL #3   Title  Patient will complete 5TSTS test in equal or less than 11 seconds to demonstrate improved LE strength and power for functional activities such as stairs, walking, transferring.    Baseline  16 seconds (01/08/2019);    Time  8    Period  Weeks    Status  New    Target  Date  03/06/19      PT LONG TERM GOAL #4   Title  Patient will be able to heel walk x 10 steps with BUE support without buckling or knee pain to improved functional balance and LE stabilty for ambulation.    Baseline  Heel walking: two trials = L knee buckled and painful, required BUE support (01/08/2019);    Time  8    Period  Weeks    Status  New    Target Date  03/06/19      PT LONG TERM GOAL #5   Title  Patinet will demonstrate normal gait pattern to improve ambulation safety and ability during household and community ambulation.    Baseline  Gait: antalgic gait favoring L LE, lacks knee extension and flexion, toe walks on L LE, lack of hip flexion L side.    Time  8    Period  Weeks    Status  New    Target Date  03/06/19      Additional Long Term Goals   Additional Long Term Goals  Yes      PT LONG TERM GOAL #6   Title  Demonstrate improved FOTO score by 10 units to demonstrate improvement in overall condition and self-reported functional ability.    Baseline  FOTO = 52 (01/08/2019);    Time  8    Period  Weeks    Status  New    Target Date  03/06/19      PT LONG TERM GOAL #7   Title  Complete community, work and/or recreational activities without limitation due to current  condition.    Baseline  difficulty walking, with stairs, balance, walking over uneven ground, increased fall risk. (01/08/2019);    Time  8    Period  Weeks    Status  New    Target Date  03/06/19            Plan - 01/22/19 1129    Clinical Impression Statement  Patient tolerated the session well today with improved both static and dynamic balance. Patient demonstrated increased BLE knee stability with sit to stand exercise. Patient also showing reduced hand assistant for higher level balance tasks. However, patient still presents with impaired activity tolerance due to lack of muscle endurance, strength and balance with higher level challenging exercises. Will focus on muscle strength, and balance  training in later session. Patient will benefit continued skilled physical therapy for strengthening and balance to reduce fall risk and improve functional mobility.    Personal Factors and Comorbidities  Age;Comorbidity 3+;Past/Current Experience;Fitness;Time since onset of injury/illness/exacerbation    Comorbidities  frequent UTIs (comes and goes every 2-3 years), osteopenia, depression, Hx of depression, stress, hypertension, GERD.    Examination-Activity Limitations  Stairs;Stand;Bend;Sit;Lift;Caring for Others;Transfers;Squat;Carry;Other   walking on uneven ground   Examination-Participation Restrictions  Community Activity;Other;Yard Work   ifficulty walking, with stairs, balance, walking over uneven ground, increased fall risk   Stability/Clinical Decision Making  Evolving/Moderate complexity    Rehab Potential  Good    PT Frequency  2x / week    PT Duration  8 weeks    PT Treatment/Interventions  ADLs/Self Care Home Management;Cryotherapy;Moist Heat;Therapeutic exercise;Gait training;Stair training;Functional mobility training;Therapeutic activities;Balance training;Neuromuscular re-education;Patient/family education;Manual techniques;Passive range of motion;Dry needling;Spinal Manipulations;Joint Manipulations;Energy conservation   joint mobilizations grades I-IV   PT Next Visit Plan  assess balance more formally    PT Home Exercise Plan  Medbridge Access Code: [email or web address]    Consulted and Agree with Plan of Care  Patient       Patient will benefit from skilled therapeutic intervention in order to improve the following deficits and impairments:  Abnormal gait, Decreased balance, Decreased endurance, Decreased mobility, Difficulty walking, Cardiopulmonary status limiting activity, Decreased range of motion, Impaired perceived functional ability, Improper body mechanics, Obesity, Pain, Decreased strength, Decreased safety awareness, Decreased coordination, Decreased activity  tolerance  Visit Diagnosis: Left knee pain, unspecified chronicity  Muscle weakness (generalized)  Difficulty in walking, not elsewhere classified  Unsteadiness on feet     Problem List Patient Active Problem List   Diagnosis Date Noted  . Back pain 11/15/2018  . SOB (shortness of breath) 11/15/2018  . Decreased GFR 11/14/2018  . Hyperbilirubinemia 11/14/2018  . Abdominal bloating 12/09/2017  . Osteopenia 04/29/2017  . Hyperglycemia 04/27/2017  . Reflux esophagitis   . Heartburn   . Special screening for malignant neoplasms, colon   . Hair loss 10/26/2015  . Fatigue 10/21/2015  . Vitamin D deficiency 03/15/2015  . Weight loss counseling, encounter for 02/22/2015  . Facial erythema 07/27/2014  . Health care maintenance 07/05/2014  . BP (high blood pressure) 03/26/2014  . Headache 03/08/2014  . Eyelid lesion 03/08/2014  . Shingles 11/16/2013  . Light headedness 06/22/2013  . Cough 05/18/2013  . GERD (gastroesophageal reflux disease) 05/18/2013  . Swelling of extremity 03/01/2013  . Left knee pain 03/01/2013  . Essential hypertension, benign 12/15/2012  . Frequent UTI 12/15/2012  . Depression 12/15/2012  . Environmental allergies 12/15/2012    Nelly Rout, SPT 01/22/19, 1:37 PM   [place] [facility] PHYSICAL AND  SPORTS MEDICINE [address], [place], [address] Phone: [phone]   Fax:  [phone]  Name: Susan Miller MRN: [id number] Date of Birth: 1946-03-17

## 2019-01-27 ENCOUNTER — Other Ambulatory Visit: Payer: Self-pay

## 2019-01-27 ENCOUNTER — Ambulatory Visit: Payer: Medicare HMO | Admitting: Physical Therapy

## 2019-01-27 ENCOUNTER — Encounter: Payer: Self-pay | Admitting: Physical Therapy

## 2019-01-27 DIAGNOSIS — M25562 Pain in left knee: Secondary | ICD-10-CM

## 2019-01-27 DIAGNOSIS — M6281 Muscle weakness (generalized): Secondary | ICD-10-CM | POA: Diagnosis not present

## 2019-01-27 DIAGNOSIS — R2681 Unsteadiness on feet: Secondary | ICD-10-CM | POA: Diagnosis not present

## 2019-01-27 DIAGNOSIS — R262 Difficulty in walking, not elsewhere classified: Secondary | ICD-10-CM

## 2019-01-27 NOTE — Therapy (Signed)
[facility] [address] [place], [address] Phone: [phone]   Fax:  [phone]  Physical Therapy Treatment  Patient Details  Name: Susan Miller MRN: [id number] Date of Birth: Dec 02, 1945 Referring Provider (PT): Einar Pheasant, MD   Encounter Date: 01/27/2019  PT End of Session - 01/27/19 1205    Visit Number  5    Number of Visits  16    Date for PT Re-Evaluation  03/06/19    Authorization Type  [employer] reporting period from 01/08/2019    Authorization Time Period  current cert period: 6/0/7371 - 03/06/2019 (latest PN: IE 01/08/2019)    Authorization - Visit Number  5    Authorization - Number of Visits  10    PT Start Time  1119    PT Stop Time  1205    PT Time Calculation (min)  46 min    Activity Tolerance  Patient tolerated treatment well    Behavior During Therapy  [facility] for tasks assessed/performed       Past Medical History:  Diagnosis Date  . Allergy   . Depression   . History of chicken pox   . Hx: UTI (urinary tract infection)   . Hypertension     Past Surgical History:  Procedure Laterality Date  . COLONOSCOPY WITH PROPOFOL N/A 06/20/2016   Procedure: COLONOSCOPY WITH PROPOFOL;  Surgeon: Lucilla Lame, MD;  Location: [facility] ENDOSCOPY;  Service: Endoscopy;  Laterality: N/A;  . ESOPHAGOGASTRODUODENOSCOPY (EGD) WITH PROPOFOL N/A 06/20/2016   Procedure: ESOPHAGOGASTRODUODENOSCOPY (EGD) WITH PROPOFOL;  Surgeon: Lucilla Lame, MD;  Location: [facility] ENDOSCOPY;  Service: Endoscopy;  Laterality: N/A;  . TONSILLECTOMY     as a child    There were no vitals filed for this visit.  Subjective Assessment - 01/27/19 1123    Subjective  Patient reports 4/10 at her L knee and occasionally. Patient has been doing her home exercise programs and no recent falls. Patient states she has been dealing with UTI over the weekend and felt pretty tired today.    Pertinent History  Patient is a 73 y.o. female  who presents to outpatient physical therapy with a referral for medical diagnosis L knee pain. This patient's chief complaints consist of L knee pain, stiffness, limping during walking leading to the following functional deficits: difficulty walking, with stairs, balance, walking over uneven ground, increased fall risk. Relevant past medical history and comorbidities include frequent UTIs (comes and goes every 2-3 years), osteopenia, depression, Hx of depression, stress, hypertension, GERD.    Limitations  Walking;Standing;House hold activities;Other (comment)   stairs   Diagnostic tests  radiograph report  L knee (12/24/2018): "FINDINGS:No evidence of fracture, dislocation, or joint effusion. No evidence of arthropathy or other focal bone abnormality. Soft tissues are unremarkable."    Patient Stated Goals  "stop limping"    Currently in Pain?  Yes    Pain Score  4     Pain Onset  More than a month ago        SBA to CGA throughout the session today.    Therapeutic exercise:to centralize symptoms and improve ROM, strength, muscular endurance, and activity tolerance required for successful completion of functional activities. - seated bike for warm up, seat at 8 x 52mins. For improved lower extremity mobility, muscular endurance; and to induce the analgesic effect of aerobic exercise, improve activity tolerance, stimulate improved joint nutrition, and prepare body structures and systems for following interventions. - sit <>  stand fromchair without back support - armscross over chest  - x10 reps with minimal knee valgus observed. - red theraband above knee around distal thigh 1x10  - No theraband, green youth ball between BLE at knee 1x10  Cuing provided for appropriate foot distance, knee position to improve body mechanics and muscle activations for strengthening, balance as well as pain control.   Standing at bars: - LE  strengthening with red theraband above knee - hip abduction R/L1x 10 3 s hold with BUE hand over hand support on treadmill bar  - hip extension R/L1x 10 3s hold with BUE hand over hand support on treadmill bar Moderate cuing provided for pelvic alignment and upper torso standing through midline  Sitting on white physioball CGA with R knee block by this writer - Abdominal extension/flexion   - with BUE holding on red theraband over bar x10   - without UE support over anterior thigh x 10   - with UE cross over chest. x 10  Moderate cuing for postural, stability control using upper trunk, as well as proper muscle activation.   Standing with BUE holding on bar:  - Toe raises x 10  - heel raises   - over beam: x10   - over rolled towel: x 10 Moderate cuing for postural, stability control using upper trunk, as well as proper muscle activation.   Neuromuscular Re-education:to improve, balance, postural strength, muscle activation patterns, and stabilization strength required for functional activities: CGA to SBA provided.  - Ambulation heel to toe (forward) 8 feet x (1 reps ). With one hand assist on treadmill bar intermittently. Mild to moderate postural sway more on airex, but used less hand assist on bar to recover balance overall.  - Toe walking x 8 feet x 2 reps  - Heel walking x 2 steps and patient states pulling from her L knee/ Unable to proceed due to discomfort.    HOME EXERCISE PROGRAM  Access Code: [email or web address]  URL: [email or web address]  Date: 01/27/2019  Prepared by: Norton Blizzard   Exercises  Sit to Stand without Arm Support - 3 sets - 10 reps - 1x daily - 7x weekly  Standing Hip Abduction with Counter Support - 10 reps - 3 sets - 3 hold - 1x daily - 7x weekly  Standing Hip Extension with Counter Support - 10 reps - 3 sets - 3 hold - 1x daily - 7x weekly  Heel rises with counter support - 2 sets - 10 reps - 1x daily - 7x weekly  Toe  Raises with Counter Support - 2 sets - 10 reps - 1x daily - 7x weekly     Patient tolerated the session well today and no increase of L knee pain at the end of the session. Patient demonstrated good muscle and strength at BLE with strengthen exercises. Patient also showing improvments with dynamic balance with narrow base of support. However, patient still requiring moderate cuing to maintain upper trunk stability with higher level challenging core exercises. Continues to have buckling of L knee with attempts to heel walk, but was able to complete toe raises in standing successfully.  Will focus on muscle strength, and balance training in later session. Patient will benefit continued skilled physical therapy for strengthening and balance to reduce fall risk and improve functional mobility.        PT Education - 01/27/19 1204    Education Details  Exercise purpose/form    Person(s) Educated  Patient    Methods  Explanation;Demonstration;Tactile cues;Verbal cues    Comprehension  Verbalized understanding;Returned demonstration;Verbal cues required;Tactile cues required       PT Short Term Goals - 01/22/19 1329      PT SHORT TERM GOAL #1   Title  Be independent with initial home exercise program for self-management of symptoms.    Baseline  provided with initial HEP (01/08/2019);    Time  2    Period  Weeks    Status  Achieved    Target Date  01/23/19        PT Long Term Goals - 01/09/19 1318      PT LONG TERM GOAL #1   Title  Be independent with a long-term home exercise program for self-management of symptoms.    Baseline  Initial HEP provided at IE (01/08/2019);    Time  8    Period  Weeks    Status  New    Target Date  03/06/19      PT LONG TERM GOAL #2   Title  Patient will demonstrate left knee strength equal or greater 5/5 and B hip strength equal or greater than 4+/5 to demonstrate functional strength for improved gait, increased standing tolerance, decreased fall risk,  increased stair climbing ability.    Baseline  see objective exam (01/08/2019);    Time  8    Period  Weeks    Status  New    Target Date  03/06/19      PT LONG TERM GOAL #3   Title  Patient will complete 5TSTS test in equal or less than 11 seconds to demonstrate improved LE strength and power for functional activities such as stairs, walking, transferring.    Baseline  16 seconds (01/08/2019);    Time  8    Period  Weeks    Status  New    Target Date  03/06/19      PT LONG TERM GOAL #4   Title  Patient will be able to heel walk x 10 steps with BUE support without buckling or knee pain to improved functional balance and LE stabilty for ambulation.    Baseline  Heel walking: two trials = L knee buckled and painful, required BUE support (01/08/2019);    Time  8    Period  Weeks    Status  New    Target Date  03/06/19      PT LONG TERM GOAL #5   Title  Patinet will demonstrate normal gait pattern to improve ambulation safety and ability during household and community ambulation.    Baseline  Gait: antalgic gait favoring L LE, lacks knee extension and flexion, toe walks on L LE, lack of hip flexion L side.    Time  8    Period  Weeks    Status  New    Target Date  03/06/19      Additional Long Term Goals   Additional Long Term Goals  Yes      PT LONG TERM GOAL #6   Title  Demonstrate improved FOTO score by 10 units to demonstrate improvement in overall condition and self-reported functional ability.    Baseline  FOTO = 52 (01/08/2019);    Time  8    Period  Weeks    Status  New    Target Date  03/06/19      PT LONG TERM GOAL #7   Title  Complete community, work and/or recreational activities without limitation due to current condition.  Baseline  difficulty walking, with stairs, balance, walking over uneven ground, increased fall risk. (01/08/2019);    Time  8    Period  Weeks    Status  New    Target Date  03/06/19            Plan - 01/27/19 1205    Clinical Impression  Statement  Patient tolerated the session well today and no increase of L knee pain at the end of the session. Patient demonstrated good muscle and strength at BLE with strengthen exercises. Patient also showing improvments with dynamic balance with narrow base of support. However, patient still requiring moderate cuing to maintain upper trunk stability with higher level challenging core exercises. Will focus on muscle strength, and balance training in later session. Patient will benefit continued skilled physical therapy for strengthening and balance to reduce fall risk and improve functional mobility.    Personal Factors and Comorbidities  Age;Comorbidity 3+;Past/Current Experience;Fitness;Time since onset of injury/illness/exacerbation    Comorbidities  frequent UTIs (comes and goes every 2-3 years), osteopenia, depression, Hx of depression, stress, hypertension, GERD.    Examination-Activity Limitations  Stairs;Stand;Bend;Sit;Lift;Caring for Others;Transfers;Squat;Carry;Other   walking on uneven ground   Examination-Participation Restrictions  Community Activity;Other;Yard Work   ifficulty walking, with stairs, balance, walking over uneven ground, increased fall risk   Stability/Clinical Decision Making  Evolving/Moderate complexity    Rehab Potential  Good    PT Frequency  2x / week    PT Duration  8 weeks    PT Treatment/Interventions  ADLs/Self Care Home Management;Cryotherapy;Moist Heat;Therapeutic exercise;Gait training;Stair training;Functional mobility training;Therapeutic activities;Balance training;Neuromuscular re-education;Patient/family education;Manual techniques;Passive range of motion;Dry needling;Spinal Manipulations;Joint Manipulations;Energy conservation   joint mobilizations grades I-IV   PT Next Visit Plan  assess balance more formally    PT Home Exercise Plan  Medbridge Access Code: [email or web address]    Consulted and Agree with Plan of Care  Patient       Patient will benefit from  skilled therapeutic intervention in order to improve the following deficits and impairments:  Abnormal gait, Decreased balance, Decreased endurance, Decreased mobility, Difficulty walking, Cardiopulmonary status limiting activity, Decreased range of motion, Impaired perceived functional ability, Improper body mechanics, Obesity, Pain, Decreased strength, Decreased safety awareness, Decreased coordination, Decreased activity tolerance  Visit Diagnosis: Left knee pain, unspecified chronicity  Muscle weakness (generalized)  Difficulty in walking, not elsewhere classified  Unsteadiness on feet     Problem List Patient Active Problem List   Diagnosis Date Noted  . Back pain 11/15/2018  . SOB (shortness of breath) 11/15/2018  . Decreased GFR 11/14/2018  . Hyperbilirubinemia 11/14/2018  . Abdominal bloating 12/09/2017  . Osteopenia 04/29/2017  . Hyperglycemia 04/27/2017  . Reflux esophagitis   . Heartburn   . Special screening for malignant neoplasms, colon   . Hair loss 10/26/2015  . Fatigue 10/21/2015  . Vitamin D deficiency 03/15/2015  . Weight loss counseling, encounter for 02/22/2015  . Facial erythema 07/27/2014  . Health care maintenance 07/05/2014  . BP (high blood pressure) 03/26/2014  . Headache 03/08/2014  . Eyelid lesion 03/08/2014  . Shingles 11/16/2013  . Light headedness 06/22/2013  . Cough 05/18/2013  . GERD (gastroesophageal reflux disease) 05/18/2013  . Swelling of extremity 03/01/2013  . Left knee pain 03/01/2013  . Essential hypertension, benign 12/15/2012  . Frequent UTI 12/15/2012  . Depression 12/15/2012  . Environmental allergies 12/15/2012    Nelly RoutNan Hershell Brandl, SPT 01/27/19, 2:02 PM   Luretha MurphySara R. Ilsa IhaSnyder, PT, DPT 01/27/19, 2:05 PM  [place] [facility] [address] [place], [place], [id number] Phone: [phone]   Fax:  [phone]  Name: Tona SensingLinda H Quain MRN: [id number] Date of Birth:  1945-08-10

## 2019-01-29 ENCOUNTER — Encounter: Payer: Self-pay | Admitting: Physical Therapy

## 2019-01-29 ENCOUNTER — Other Ambulatory Visit: Payer: Self-pay

## 2019-01-29 ENCOUNTER — Ambulatory Visit: Payer: Medicare HMO | Admitting: Physical Therapy

## 2019-01-29 DIAGNOSIS — R262 Difficulty in walking, not elsewhere classified: Secondary | ICD-10-CM

## 2019-01-29 DIAGNOSIS — R2681 Unsteadiness on feet: Secondary | ICD-10-CM

## 2019-01-29 DIAGNOSIS — M6281 Muscle weakness (generalized): Secondary | ICD-10-CM

## 2019-01-29 DIAGNOSIS — M25562 Pain in left knee: Secondary | ICD-10-CM | POA: Diagnosis not present

## 2019-01-29 NOTE — Therapy (Signed)
[facility] [address] [place], [address] Phone: [phone]   Fax:  [phone]  Physical Therapy Treatment  Patient Details  Name: Susan Miller MRN: [id number] Date of Birth: Feb 03, 1946 Referring Provider (PT): Einar Pheasant, MD   Encounter Date: 01/29/2019  PT End of Session - 01/29/19 1440    Visit Number  6    Number of Visits  16    Date for PT Re-Evaluation  03/06/19    Authorization Type  [employer] reporting period from 01/08/2019    Authorization Time Period  current cert period: 12/07/4233 - 03/06/2019 (latest PN: IE 01/08/2019)    Authorization - Visit Number  6    Authorization - Number of Visits  10    PT Start Time  1117    PT Stop Time  1203    PT Time Calculation (min)  46 min    Equipment Utilized During Treatment  Gait belt    Activity Tolerance  Patient tolerated treatment well    Behavior During Therapy  [facility] for tasks assessed/performed       Past Medical History:  Diagnosis Date  . Allergy   . Depression   . History of chicken pox   . Hx: UTI (urinary tract infection)   . Hypertension     Past Surgical History:  Procedure Laterality Date  . COLONOSCOPY WITH PROPOFOL N/A 06/20/2016   Procedure: COLONOSCOPY WITH PROPOFOL;  Surgeon: Lucilla Lame, MD;  Location: [facility] ENDOSCOPY;  Service: Endoscopy;  Laterality: N/A;  . ESOPHAGOGASTRODUODENOSCOPY (EGD) WITH PROPOFOL N/A 06/20/2016   Procedure: ESOPHAGOGASTRODUODENOSCOPY (EGD) WITH PROPOFOL;  Surgeon: Lucilla Lame, MD;  Location: [facility] ENDOSCOPY;  Service: Endoscopy;  Laterality: N/A;  . TONSILLECTOMY     as a child    There were no vitals filed for this visit.  Subjective Assessment - 01/29/19 1130    Subjective  Patient reports 1/10 pain at her L knee. Patient has no recent falls and felt less tired than last visit. She also has been doing HEP without problems    Pertinent History  Patient is a 73 y.o. female who presents  to outpatient physical therapy with a referral for medical diagnosis L knee pain. This patient's chief complaints consist of L knee pain, stiffness, limping during walking leading to the following functional deficits: difficulty walking, with stairs, balance, walking over uneven ground, increased fall risk. Relevant past medical history and comorbidities include frequent UTIs (comes and goes every 2-3 years), osteopenia, depression, Hx of depression, stress, hypertension, GERD.    Limitations  Walking;Standing;House hold activities;Other (comment)   stairs   Diagnostic tests  radiograph report  L knee (12/24/2018): "FINDINGS:No evidence of fracture, dislocation, or joint effusion. No evidence of arthropathy or other focal bone abnormality. Soft tissues are unremarkable."    Patient Stated Goals  "stop limping"    Currently in Pain?  Yes    Pain Score  1     Pain Location  Knee    Pain Orientation  Left    Pain Onset  More than a month ago       SBA to CGA throughout the session today.   Therapeutic exercise:to centralize symptoms and improve ROM, strength, muscular endurance, and activity tolerance required for successful completion of functional activities. - seated bike for warm up, seat at 8 x41mins. For improved lower extremity mobility, muscular endurance; and to induce the analgesic effect of aerobic exercise,improve activity tolerance,stimulate improved joint nutrition, and  prepare body structures and systems for following interventions. Red theraband around chest - sit <>stand fromchair without back support - armscross over chest - x10reps with minimal knee valgus observed. - x10 with cuing to slower descending to the chair  -x10 with airex under R foot.   - x1 with airex under L foot. Unable to proceed due to lack of muscle activation.  Cuing provided for appropriate foot distance, knee positionto improve body  mechanics and muscle activations for strengthening, balance as well as pain control.   Standing at bars: - LE strengthening  -with red theraband tied on treadmill bar(1set)/hold by PT(1set) above/below knee  - hip adduction R/L2x 10 3 s hold with BUE hand support on treadmill bar  -with slider under foot   - hip adduction R/L2x 10 3s hold with BUE  hand support on treadmill bar Heavy cuing provided for pelvic alignment and upper torso standing through midline. Patient has difficulties to use slider due to lack of proper muscle control and complexity of the tasks. Modifications provided as following:   - Seated hip adduction with red theraband below knee hold by PT. 1x10 reps at bilateral LE.  Added to HEP. Patient states her son will be able to help her to hold the band at home. Will provide updated HEP handout next visit.   Neuromuscular Re-education:to improve, balance, postural strength, muscle activation patterns, and stabilization strength required for functional activities: CGA to SBAprovided.  Standing with BUE holding on bar:  - Toe walk 4 x 10 (65feet each set) - heel walk 4x10 (87feet each set)          Moderate cuing for postural, stability control using upper trunk, as well as proper muscle activation. States pulling sensation at her L knee that prevent her holding her up.    HOME EXERCISE PROGRAM  Access Code: [email or web address]  URL: [email or web address]  Date: 01/27/2019  Prepared by: Norton Blizzard   Exercises  Sit to Stand without Arm Support - 3 sets - 10 reps - 1x daily - 7x weekly  Standing Hip Abduction with Counter Support - 10 reps - 3 sets - 3 hold - 1x daily - 7x weekly  Standing Hip Extension with Counter Support - 10 reps - 3 sets - 3 hold - 1x daily - 7x weekly  Heel rises with counter support - 2 sets - 10 reps - 1x daily - 7x weekly  Toe Raises with Counter Support - 2 sets - 10 reps - 1x daily - 7x weekly      Patient tolerated the session well today and no increase of L knee pain at the end of the session. Patient demonstrated good pain control during exercise today. Patient also showing great posture with established exercise. However, with challenging modifications, patient has difficulties weight bearing through L LE during functional activities (STS) due to lack of muscle activation. Continues to have buckling of L knee with attempts to heel walk, but able to hold longer time than the previous visit.  Will focus on gait, muscle strength, and balance training in later session. Patient will benefit continued skilled physical therapy for strengthening and balance to reduce fall risk and improve functional mobility.      PT Education - 01/29/19 1440    Education Details  Exercise purpose/form    Person(s) Educated  Patient    Methods  Explanation;Demonstration;Tactile cues;Verbal cues    Comprehension  Verbalized understanding;Returned demonstration;Verbal cues required;Tactile cues required       PT  Short Term Goals - 01/22/19 1329      PT SHORT TERM GOAL #1   Title  Be independent with initial home exercise program for self-management of symptoms.    Baseline  provided with initial HEP (01/08/2019);    Time  2    Period  Weeks    Status  Achieved    Target Date  01/23/19        PT Long Term Goals - 01/09/19 1318      PT LONG TERM GOAL #1   Title  Be independent with a long-term home exercise program for self-management of symptoms.    Baseline  Initial HEP provided at IE (01/08/2019);    Time  8    Period  Weeks    Status  New    Target Date  03/06/19      PT LONG TERM GOAL #2   Title  Patient will demonstrate left knee strength equal or greater 5/5 and B hip strength equal or greater than 4+/5 to demonstrate functional strength for improved gait, increased standing tolerance, decreased fall risk, increased stair climbing ability.    Baseline  see objective exam (01/08/2019);     Time  8    Period  Weeks    Status  New    Target Date  03/06/19      PT LONG TERM GOAL #3   Title  Patient will complete 5TSTS test in equal or less than 11 seconds to demonstrate improved LE strength and power for functional activities such as stairs, walking, transferring.    Baseline  16 seconds (01/08/2019);    Time  8    Period  Weeks    Status  New    Target Date  03/06/19      PT LONG TERM GOAL #4   Title  Patient will be able to heel walk x 10 steps with BUE support without buckling or knee pain to improved functional balance and LE stabilty for ambulation.    Baseline  Heel walking: two trials = L knee buckled and painful, required BUE support (01/08/2019);    Time  8    Period  Weeks    Status  New    Target Date  03/06/19      PT LONG TERM GOAL #5   Title  Patinet will demonstrate normal gait pattern to improve ambulation safety and ability during household and community ambulation.    Baseline  Gait: antalgic gait favoring L LE, lacks knee extension and flexion, toe walks on L LE, lack of hip flexion L side.    Time  8    Period  Weeks    Status  New    Target Date  03/06/19      Additional Long Term Goals   Additional Long Term Goals  Yes      PT LONG TERM GOAL #6   Title  Demonstrate improved FOTO score by 10 units to demonstrate improvement in overall condition and self-reported functional ability.    Baseline  FOTO = 52 (01/08/2019);    Time  8    Period  Weeks    Status  New    Target Date  03/06/19      PT LONG TERM GOAL #7   Title  Complete community, work and/or recreational activities without limitation due to current condition.    Baseline  difficulty walking, with stairs, balance, walking over uneven ground, increased fall risk. (01/08/2019);    Time  8    Period  Weeks    Status  New    Target Date  03/06/19            Plan - 01/29/19 1441    Clinical Impression Statement  Patient tolerated the session well today and no increase of L knee  pain at the end of the session. Patient demonstrated good pain control during exercise today. Patient also showing great posture with established exercise. However, with challenging modifications, patient has difficulties weight bearing through L LE during functional activities (STS) due to lack of muscle activation. Continues to have buckling of L knee with attempts to heel walk, but able to hold longer time than the previous visit.  Will focus on gait, muscle strength, and balance training in later session. Patient will benefit continued skilled physical therapy for strengthening and balance to reduce fall risk and improve functional mobility.    Personal Factors and Comorbidities  Age;Comorbidity 3+;Past/Current Experience;Fitness;Time since onset of injury/illness/exacerbation    Comorbidities  frequent UTIs (comes and goes every 2-3 years), osteopenia, depression, Hx of depression, stress, hypertension, GERD.    Examination-Activity Limitations  Stairs;Stand;Bend;Sit;Lift;Caring for Others;Transfers;Squat;Carry;Other   walking on uneven ground   Examination-Participation Restrictions  Community Activity;Other;Yard Work   ifficulty walking, with stairs, balance, walking over uneven ground, increased fall risk   Stability/Clinical Decision Making  Evolving/Moderate complexity    Rehab Potential  Good    PT Frequency  2x / week    PT Duration  8 weeks    PT Treatment/Interventions  ADLs/Self Care Home Management;Cryotherapy;Moist Heat;Therapeutic exercise;Gait training;Stair training;Functional mobility training;Therapeutic activities;Balance training;Neuromuscular re-education;Patient/family education;Manual techniques;Passive range of motion;Dry needling;Spinal Manipulations;Joint Manipulations;Energy conservation   joint mobilizations grades I-IV   PT Next Visit Plan  assess balance more formally    PT Home Exercise Plan  Medbridge Access Code: [email or web address]    Consulted and Agree with Plan of Care   Patient       Patient will benefit from skilled therapeutic intervention in order to improve the following deficits and impairments:  Abnormal gait, Decreased balance, Decreased endurance, Decreased mobility, Difficulty walking, Cardiopulmonary status limiting activity, Decreased range of motion, Impaired perceived functional ability, Improper body mechanics, Obesity, Pain, Decreased strength, Decreased safety awareness, Decreased coordination, Decreased activity tolerance  Visit Diagnosis: Muscle weakness (generalized)  Difficulty in walking, not elsewhere classified  Unsteadiness on feet  Left knee pain, unspecified chronicity     Problem List Patient Active Problem List   Diagnosis Date Noted  . Back pain 11/15/2018  . SOB (shortness of breath) 11/15/2018  . Decreased GFR 11/14/2018  . Hyperbilirubinemia 11/14/2018  . Abdominal bloating 12/09/2017  . Osteopenia 04/29/2017  . Hyperglycemia 04/27/2017  . Reflux esophagitis   . Heartburn   . Special screening for malignant neoplasms, colon   . Hair loss 10/26/2015  . Fatigue 10/21/2015  . Vitamin D deficiency 03/15/2015  . Weight loss counseling, encounter for 02/22/2015  . Facial erythema 07/27/2014  . Health care maintenance 07/05/2014  . BP (high blood pressure) 03/26/2014  . Headache 03/08/2014  . Eyelid lesion 03/08/2014  . Shingles 11/16/2013  . Light headedness 06/22/2013  . Cough 05/18/2013  . GERD (gastroesophageal reflux disease) 05/18/2013  . Swelling of extremity 03/01/2013  . Left knee pain 03/01/2013  . Essential hypertension, benign 12/15/2012  . Frequent UTI 12/15/2012  . Depression 12/15/2012  . Environmental allergies 12/15/2012    Nelly Rout, SPT 01/29/19, 3:00 PM  Luretha Murphy. Ilsa Iha, PT, DPT 01/29/19, 3:03 PM  [facility] [address] [place], [address] Phone: [phone]   Fax:  [phone]  Name: ELLYSA PARRACK MRN: [id number] Date of Birth: 04/03/1946

## 2019-01-31 ENCOUNTER — Ambulatory Visit (INDEPENDENT_AMBULATORY_CARE_PROVIDER_SITE_OTHER): Payer: Medicare HMO

## 2019-01-31 ENCOUNTER — Other Ambulatory Visit: Payer: Self-pay

## 2019-01-31 DIAGNOSIS — Z23 Encounter for immunization: Secondary | ICD-10-CM

## 2019-02-03 ENCOUNTER — Encounter: Payer: Self-pay | Admitting: Physical Therapy

## 2019-02-03 ENCOUNTER — Other Ambulatory Visit: Payer: Self-pay

## 2019-02-03 ENCOUNTER — Ambulatory Visit: Payer: Medicare HMO | Admitting: Physical Therapy

## 2019-02-03 DIAGNOSIS — R262 Difficulty in walking, not elsewhere classified: Secondary | ICD-10-CM

## 2019-02-03 DIAGNOSIS — M6281 Muscle weakness (generalized): Secondary | ICD-10-CM | POA: Diagnosis not present

## 2019-02-03 DIAGNOSIS — M25562 Pain in left knee: Secondary | ICD-10-CM | POA: Diagnosis not present

## 2019-02-03 DIAGNOSIS — R2681 Unsteadiness on feet: Secondary | ICD-10-CM | POA: Diagnosis not present

## 2019-02-03 NOTE — Therapy (Signed)
[facility] [address], [place], [address] Phone: [phone]   Fax:  [phone]  Physical Therapy Treatment  Patient Details  Name: Susan Miller MRN: [id number] Date of Birth: 1945/12/11 Referring Provider (PT): Einar Pheasant, MD   Encounter Date: 02/03/2019  PT End of Session - 02/03/19 1446    Visit Number  7    Number of Visits  16    Date for PT Re-Evaluation  03/06/19    Authorization Type  [employer] reporting period from 01/08/2019    Authorization Time Period  current cert period: 11/10/6431 - 03/06/2019 (latest PN: IE 01/08/2019)    Authorization - Visit Number  7    Authorization - Number of Visits  10    PT Start Time  1120    PT Stop Time  1202    PT Time Calculation (min)  42 min    Equipment Utilized During Treatment  Gait belt    Activity Tolerance  Patient tolerated treatment well    Behavior During Therapy  [facility] for tasks assessed/performed       Past Medical History:  Diagnosis Date  . Allergy   . Depression   . History of chicken pox   . Hx: UTI (urinary tract infection)   . Hypertension     Past Surgical History:  Procedure Laterality Date  . COLONOSCOPY WITH PROPOFOL N/A 06/20/2016   Procedure: COLONOSCOPY WITH PROPOFOL;  Surgeon: Lucilla Lame, MD;  Location: [facility] ENDOSCOPY;  Service: Endoscopy;  Laterality: N/A;  . ESOPHAGOGASTRODUODENOSCOPY (EGD) WITH PROPOFOL N/A 06/20/2016   Procedure: ESOPHAGOGASTRODUODENOSCOPY (EGD) WITH PROPOFOL;  Surgeon: Lucilla Lame, MD;  Location: [facility] ENDOSCOPY;  Service: Endoscopy;  Laterality: N/A;  . TONSILLECTOMY     as a child    There were no vitals filed for this visit.  Subjective Assessment - 02/03/19 1122    Subjective  Patient states she felt pretty tired and weak at her L knee today because she had a phone call at 10pm last night which woke her up and she hasn't been sleeping since then. She denied L knee pain only occationally  stiffness and discomfort at L knee when she woke in the morning    Pertinent History  Patient is a 73 y.o. female who presents to outpatient physical therapy with a referral for medical diagnosis L knee pain. This patient's chief complaints consist of L knee pain, stiffness, limping during walking leading to the following functional deficits: difficulty walking, with stairs, balance, walking over uneven ground, increased fall risk. Relevant past medical history and comorbidities include frequent UTIs (comes and goes every 2-3 years), osteopenia, depression, Hx of depression, stress, hypertension, GERD.    Limitations  Walking;Standing;House hold activities;Other (comment)   stairs   Diagnostic tests  radiograph report  L knee (12/24/2018): "FINDINGS:No evidence of fracture, dislocation, or joint effusion. No evidence of arthropathy or other focal bone abnormality. Soft tissues are unremarkable."    Patient Stated Goals  "stop limping"    Currently in Pain?  No/denies    Pain Location  Knee    Pain Orientation  Left    Pain Descriptors / Indicators  Discomfort   Occationally   Pain Onset  More than a month ago       SBA to CGA throughout the session today.    Therapeutic exercise: to centralize symptoms and improve ROM, strength, muscular endurance, and activity tolerance required for successful completion of functional activities.  -  seated bike for warm up, seat at 8 x . For improved lower extremity mobility, muscular endurance; and to induce the analgesic effect of aerobic exercise, improve activity tolerance, stimulate improved joint nutrition, and prepare body structures and systems for following interventions. Red theraband around abdominal  - sit <> stand from chair without back support             - arms cross over chest  - x10 reps with mild knee valgus observed due to patient tiredness from lack of sleep                         - x10 with airex under R foot.     - x8 with airex  under R foot and red theraband above B knees.                         - x10 with airex under L foot. (increased L knee valgus noted during sit up phase)    - x8 with airex under L foot and red theraband above B knees. Cuing provided for appropriate foot distance, knee position to improve body mechanics and muscle activations for strengthening, balance as well as pain control. Patient cannot proceed to further than 8 reps due to fatigue.   - Standing at treadmill bar with UEs help for balance  - standing on 8 x 8 inch tilt board to improve eccentric/concentric heel dorsiflexion and plantar flexion. 3 x 20 reps. Patient presents improve dorsiflexion loading pattern at L LE within session practice.  - Ambulation 4 reps 80 feet each. Cuing provided for heel strike patterns, postural relaxation as well as proper heel to toe loading patterns.    Neuromuscular Re-education: to improve, balance, postural strength, muscle activation patterns, and stabilization strength required for functional activities: CGA to SBA provided.   - Ambulation heel to toe (forward/backward) 8 feet x (5 reps). With one hand assist on treadmill bar intermittently. Mild postural sway but able to maintain balance. Cuing to upright posture and shoulder relaxation.  Standing with BUE holding on bar:  - Toe walk 5 x 10 forward/backward (77feet each set) - heel walk 5 x10  forward/backward (39feet each set)    Cuing provided to encourage controled plantar flexion at L foot. Patient still requires extensive cuing and unable to maintain L LE loaded dorsiflexion.          HOME EXERCISE PROGRAM  Access Code: [email or web address]  URL: [email or web address]  Date: 01/27/2019  Prepared by: Norton Blizzard    Exercises  Sit to Stand without Arm Support - 3 sets - 10 reps - 1x daily - 7x weekly  Standing Hip Abduction with Counter Support - 10 reps - 3 sets - 3 hold - 1x daily - 7x weekly  Standing Hip Extension with Counter Support - 10  reps - 3 sets - 3 hold - 1x daily - 7x weekly  Heel rises with counter support - 2 sets - 10 reps - 1x daily - 7x weekly  Toe Raises with Counter Support - 2 sets - 10 reps - 1x daily - 7x weekly    Patient tolerate the session well today and didn't complain of L knee pain through out the session. Patient demonstrated improve LLE strength with functional mobility testing(sit to stand). Patient also presents increased gait control with L knee strike gait pattern and reduced trendelenburg. However, patient still presents with difficulties weight loading at  L heel and lack of knee extension at walking. Will focus on gait, muscle strength, and balance training in later session. Patient will benefit continued skilled physical therapy for strengthening and balance to reduce fall risk and improve functional mobility.     PT Education - 02/03/19 1445    Education Details  Exercise purpose/form    Person(s) Educated  Patient    Methods  Explanation;Demonstration;Tactile cues;Verbal cues    Comprehension  Verbalized understanding;Returned demonstration;Verbal cues required;Tactile cues required       PT Short Term Goals - 01/22/19 1329      PT SHORT TERM GOAL #1   Title  Be independent with initial home exercise program for self-management of symptoms.    Baseline  provided with initial HEP (01/08/2019);    Time  2    Period  Weeks    Status  Achieved    Target Date  01/23/19        PT Long Term Goals - 01/09/19 1318      PT LONG TERM GOAL #1   Title  Be independent with a long-term home exercise program for self-management of symptoms.    Baseline  Initial HEP provided at IE (01/08/2019);    Time  8    Period  Weeks    Status  New    Target Date  03/06/19      PT LONG TERM GOAL #2   Title  Patient will demonstrate left knee strength equal or greater 5/5 and B hip strength equal or greater than 4+/5 to demonstrate functional strength for improved gait, increased standing tolerance,  decreased fall risk, increased stair climbing ability.    Baseline  see objective exam (01/08/2019);    Time  8    Period  Weeks    Status  New    Target Date  03/06/19      PT LONG TERM GOAL #3   Title  Patient will complete 5TSTS test in equal or less than 11 seconds to demonstrate improved LE strength and power for functional activities such as stairs, walking, transferring.    Baseline  16 seconds (01/08/2019);    Time  8    Period  Weeks    Status  New    Target Date  03/06/19      PT LONG TERM GOAL #4   Title  Patient will be able to heel walk x 10 steps with BUE support without buckling or knee pain to improved functional balance and LE stabilty for ambulation.    Baseline  Heel walking: two trials = L knee buckled and painful, required BUE support (01/08/2019);    Time  8    Period  Weeks    Status  New    Target Date  03/06/19      PT LONG TERM GOAL #5   Title  Patinet will demonstrate normal gait pattern to improve ambulation safety and ability during household and community ambulation.    Baseline  Gait: antalgic gait favoring L LE, lacks knee extension and flexion, toe walks on L LE, lack of hip flexion L side.    Time  8    Period  Weeks    Status  New    Target Date  03/06/19      Additional Long Term Goals   Additional Long Term Goals  Yes      PT LONG TERM GOAL #6   Title  Demonstrate improved FOTO score by 10 units to demonstrate  improvement in overall condition and self-reported functional ability.    Baseline  FOTO = 52 (01/08/2019);    Time  8    Period  Weeks    Status  New    Target Date  03/06/19      PT LONG TERM GOAL #7   Title  Complete community, work and/or recreational activities without limitation due to current condition.    Baseline  difficulty walking, with stairs, balance, walking over uneven ground, increased fall risk. (01/08/2019);    Time  8    Period  Weeks    Status  New    Target Date  03/06/19            Plan - 02/03/19 1446     Clinical Impression Statement  Patient tolerate the session well today and didn't complain of L knee pain through out the session. Patient demonstrated improve LLE strength with functional mobility testing(sit to stand). Patient also presents increased gait control with L knee strike gait pattern and reduced trendelenburg. However, patient still presents with difficulties weight loading at L heel and lack of knee extension at walking. Will focus on gait, muscle strength, and balance training in later session. Patient will benefit continued skilled physical therapy for strengthening and balance to reduce fall risk and improve functional mobility.    Personal Factors and Comorbidities  Age;Comorbidity 3+;Past/Current Experience;Fitness;Time since onset of injury/illness/exacerbation    Comorbidities  frequent UTIs (comes and goes every 2-3 years), osteopenia, depression, Hx of depression, stress, hypertension, GERD.    Examination-Activity Limitations  Stairs;Stand;Bend;Sit;Lift;Caring for Others;Transfers;Squat;Carry;Other   walking on uneven ground   Examination-Participation Restrictions  Community Activity;Other;Yard Work   ifficulty walking, with stairs, balance, walking over uneven ground, increased fall risk   Stability/Clinical Decision Making  Evolving/Moderate complexity    Rehab Potential  Good    PT Frequency  2x / week    PT Duration  8 weeks    PT Treatment/Interventions  ADLs/Self Care Home Management;Cryotherapy;Moist Heat;Therapeutic exercise;Gait training;Stair training;Functional mobility training;Therapeutic activities;Balance training;Neuromuscular re-education;Patient/family education;Manual techniques;Passive range of motion;Dry needling;Spinal Manipulations;Joint Manipulations;Energy conservation   joint mobilizations grades I-IV   PT Next Visit Plan  assess balance more formally    PT Home Exercise Plan  Medbridge Access Code: [email or web address]    Consulted and Agree with Plan of Care   Patient       Patient will benefit from skilled therapeutic intervention in order to improve the following deficits and impairments:  Abnormal gait, Decreased balance, Decreased endurance, Decreased mobility, Difficulty walking, Cardiopulmonary status limiting activity, Decreased range of motion, Impaired perceived functional ability, Improper body mechanics, Obesity, Pain, Decreased strength, Decreased safety awareness, Decreased coordination, Decreased activity tolerance  Visit Diagnosis: Muscle weakness (generalized)  Difficulty in walking, not elsewhere classified  Unsteadiness on feet  Left knee pain, unspecified chronicity     Problem List Patient Active Problem List   Diagnosis Date Noted  . Back pain 11/15/2018  . SOB (shortness of breath) 11/15/2018  . Decreased GFR 11/14/2018  . Hyperbilirubinemia 11/14/2018  . Abdominal bloating 12/09/2017  . Osteopenia 04/29/2017  . Hyperglycemia 04/27/2017  . Reflux esophagitis   . Heartburn   . Special screening for malignant neoplasms, colon   . Hair loss 10/26/2015  . Fatigue 10/21/2015  . Vitamin D deficiency 03/15/2015  . Weight loss counseling, encounter for 02/22/2015  . Facial erythema 07/27/2014  . Health care maintenance 07/05/2014  . BP (high blood pressure) 03/26/2014  . Headache 03/08/2014  . Eyelid lesion  03/08/2014  . Shingles 11/16/2013  . Light headedness 06/22/2013  . Cough 05/18/2013  . GERD (gastroesophageal reflux disease) 05/18/2013  . Swelling of extremity 03/01/2013  . Left knee pain 03/01/2013  . Essential hypertension, benign 12/15/2012  . Frequent UTI 12/15/2012  . Depression 12/15/2012  . Environmental allergies 12/15/2012    Nelly Rout, SPT 02/03/19, 3:41 PM  Luretha Murphy. Ilsa Iha, PT, DPT 02/03/19, 3:45 PM  [facility] [address] [place], [address] Phone: [phone]   Fax:  [phone]  Name: Susan Miller MRN: [id number] Date of Birth: 12/06/1945

## 2019-02-04 ENCOUNTER — Ambulatory Visit: Payer: Medicare HMO

## 2019-02-05 ENCOUNTER — Other Ambulatory Visit: Payer: Self-pay

## 2019-02-05 ENCOUNTER — Encounter: Payer: Self-pay | Admitting: Physical Therapy

## 2019-02-05 ENCOUNTER — Ambulatory Visit: Payer: Medicare HMO | Admitting: Physical Therapy

## 2019-02-05 DIAGNOSIS — M25562 Pain in left knee: Secondary | ICD-10-CM

## 2019-02-05 DIAGNOSIS — M6281 Muscle weakness (generalized): Secondary | ICD-10-CM | POA: Diagnosis not present

## 2019-02-05 DIAGNOSIS — R262 Difficulty in walking, not elsewhere classified: Secondary | ICD-10-CM | POA: Diagnosis not present

## 2019-02-05 DIAGNOSIS — R2681 Unsteadiness on feet: Secondary | ICD-10-CM | POA: Diagnosis not present

## 2019-02-05 NOTE — Therapy (Signed)
[facility] PHYSICAL AND SPORTS MEDICINE [address], [place], [address] Phone: [phone]   Fax:  [phone]  Physical Therapy Treatment  Patient Details  Name: Susan Miller MRN: [id number] Date of Birth: 09-12-1945 Referring Provider (PT): Dale [place], MD   Encounter Date: 02/05/2019  PT End of Session - 02/05/19 1127    Visit Number  8    Number of Visits  16    Date for PT Re-Evaluation  03/06/19    Authorization Type  [employer] reporting period from 01/08/2019    Authorization Time Period  current cert period: 01/08/2019 - 03/06/2019 (latest PN: IE 01/08/2019)    Authorization - Visit Number  8    Authorization - Number of Visits  10    PT Start Time  1120    PT Stop Time  1202    PT Time Calculation (min)  42 min    Equipment Utilized During Treatment  Gait belt    Activity Tolerance  Patient tolerated treatment well    Behavior During Therapy  [facility] for tasks assessed/performed       Past Medical History:  Diagnosis Date  . Allergy   . Depression   . History of chicken pox   . Hx: UTI (urinary tract infection)   . Hypertension     Past Surgical History:  Procedure Laterality Date  . COLONOSCOPY WITH PROPOFOL N/A 06/20/2016   Procedure: COLONOSCOPY WITH PROPOFOL;  Surgeon: Midge Minium, MD;  Location: [facility] ENDOSCOPY;  Service: Endoscopy;  Laterality: N/A;  . ESOPHAGOGASTRODUODENOSCOPY (EGD) WITH PROPOFOL N/A 06/20/2016   Procedure: ESOPHAGOGASTRODUODENOSCOPY (EGD) WITH PROPOFOL;  Surgeon: Midge Minium, MD;  Location: [facility] ENDOSCOPY;  Service: Endoscopy;  Laterality: N/A;  . TONSILLECTOMY     as a child    There were no vitals filed for this visit.  Subjective Assessment - 02/05/19 1125    Subjective  Patient states no pain at her L knee and had a very good sleep last night. Patient also states her ankle weakness may be the cause for her L heel early lift during walking.    Pertinent History  Patient is a 73  y.o. female who presents to outpatient physical therapy with a referral for medical diagnosis L knee pain. This patient's chief complaints consist of L knee pain, stiffness, limping during walking leading to the following functional deficits: difficulty walking, with stairs, balance, walking over uneven ground, increased fall risk. Relevant past medical history and comorbidities include frequent UTIs (comes and goes every 2-3 years), osteopenia, depression, Hx of depression, stress, hypertension, GERD.    Limitations  Walking;Standing;House hold activities;Other (comment)   stairs   Diagnostic tests  radiograph report  L knee (12/24/2018): "FINDINGS:No evidence of fracture, dislocation, or joint effusion. No evidence of arthropathy or other focal bone abnormality. Soft tissues are unremarkable."    Patient Stated Goals  "stop limping"    Currently in Pain?  No/denies    Pain Onset  More than a month ago      SBA to CGA throughout the session today.    Therapeutic exercise: to centralize symptoms and improve ROM, strength, muscular endurance, and activity tolerance required for successful completion of functional activities.  - seated bike for warm up, seat at 8 x . For improved lower extremity mobility, muscular endurance; and to induce the analgesic effect of aerobic exercise, improve activity tolerance, stimulate improved joint nutrition, and prepare body structures and systems for following interventions.  - sit <>  stand from chair without back support             - arms cross over chest  - 4x5 reps with                          - x8 with airex under R foot. (increased L knee valgus noted during sit up phase)                         - x8 with airex under L foot. (increased L knee valgus noted during sit up phase)                         - 2x10 red theraband above B knees.     -x10 with orange medicine ball Cuing provided for appropriate foot distance, knee position to improve body  mechanics and muscle activations for strengthening, balance as well as pain control.   - Standing at treadmill bar with UEs help for balance             - standing on 8 x 8 inch tilt board to improve eccentric/concentric heel dorsiflexion and plantar flexion. 2 x 20 reps.  - Ambulation 2 reps 80 feet each. Cuing provided for heel strike patterns, postural relaxation as well as proper heel to toe loading patterns.  - Resisted bilateral ankle dorsiflexion, plantar flexion, eversion and inversion to assess L heel early lift to improve biomechanics at her L knee for pain control and fall prevention. Patient presents with at least 4+/5 on both side.    Neuromuscular Re-education: to improve, balance, postural strength, muscle activation patterns, and stabilization strength required for functional activities: CGA to SBA provided.   Standing with one UE holding on bar:  - Toe walk 3 x 5 forward/backward (7feet each set) - heel walk 3 x5  forward/backward (3feet each set)    - Single leg standing on R/L LE 20s each side to improve weight bearing tolerance and balance to reduce fall risks. Patient presents increased using ue support at stationary bar at treadmill when standing on L LE and states increase ankle valgus when standing on L LE.   Patient still requires extensive cuing and unable to maintain L LE loaded dorsiflexion.          HOME EXERCISE PROGRAM  Access Code: [email or web address]  URL: [email or web address]  Date: 01/27/2019  Prepared by: Norton Blizzard    Exercises  Sit to Stand without Arm Support - 3 sets - 10 reps - 1x daily - 7x weekly  Standing Hip Abduction with Counter Support - 10 reps - 3 sets - 3 hold - 1x daily - 7x weekly  Standing Hip Extension with Counter Support - 10 reps - 3 sets - 3 hold - 1x daily - 7x weekly  Heel rises with counter support - 2 sets - 10 reps - 1x daily - 7x weekly  Toe Raises with Counter Support - 2 sets - 10 reps - 1x daily - 7x weekly      Patient tolerate the session well today and didn't have increase of knee pain at the end of the session. Patient presented improved pain control during the session. Patient also demonstrated good gluteal activation/contraction through functional exercises (sit <>stand). However, patient still presented weakness at gluteus medius by showing knee valgus at sit to stand which may exacerbate patient's knee pain while walking. Patient also  presented with L heel early lift at terminal stance and extra time spent to further assess patient's ankle biomechanics for pain control. Will focus on gait, muscle strength, and balance training in later session. Patient will benefit continued skilled physical therapy for strengthening and balance to reduce fall risk and improve functional mobility.    PT Education - 02/05/19 1127    Education Details  Exercise purpose/form    Person(s) Educated  Patient    Methods  Explanation;Demonstration;Tactile cues;Verbal cues    Comprehension  Verbalized understanding;Returned demonstration;Verbal cues required;Tactile cues required       PT Short Term Goals - 01/22/19 1329      PT SHORT TERM GOAL #1   Title  Be independent with initial home exercise program for self-management of symptoms.    Baseline  provided with initial HEP (01/08/2019);    Time  2    Period  Weeks    Status  Achieved    Target Date  01/23/19        PT Long Term Goals - 01/09/19 1318      PT LONG TERM GOAL #1   Title  Be independent with a long-term home exercise program for self-management of symptoms.    Baseline  Initial HEP provided at IE (01/08/2019);    Time  8    Period  Weeks    Status  New    Target Date  03/06/19      PT LONG TERM GOAL #2   Title  Patient will demonstrate left knee strength equal or greater 5/5 and B hip strength equal or greater than 4+/5 to demonstrate functional strength for improved gait, increased standing tolerance, decreased fall risk, increased stair  climbing ability.    Baseline  see objective exam (01/08/2019);    Time  8    Period  Weeks    Status  New    Target Date  03/06/19      PT LONG TERM GOAL #3   Title  Patient will complete 5TSTS test in equal or less than 11 seconds to demonstrate improved LE strength and power for functional activities such as stairs, walking, transferring.    Baseline  16 seconds (01/08/2019);    Time  8    Period  Weeks    Status  New    Target Date  03/06/19      PT LONG TERM GOAL #4   Title  Patient will be able to heel walk x 10 steps with BUE support without buckling or knee pain to improved functional balance and LE stabilty for ambulation.    Baseline  Heel walking: two trials = L knee buckled and painful, required BUE support (01/08/2019);    Time  8    Period  Weeks    Status  New    Target Date  03/06/19      PT LONG TERM GOAL #5   Title  Patinet will demonstrate normal gait pattern to improve ambulation safety and ability during household and community ambulation.    Baseline  Gait: antalgic gait favoring L LE, lacks knee extension and flexion, toe walks on L LE, lack of hip flexion L side.    Time  8    Period  Weeks    Status  New    Target Date  03/06/19      Additional Long Term Goals   Additional Long Term Goals  Yes      PT LONG TERM GOAL #6  Title  Demonstrate improved FOTO score by 10 units to demonstrate improvement in overall condition and self-reported functional ability.    Baseline  FOTO = 52 (01/08/2019);    Time  8    Period  Weeks    Status  New    Target Date  03/06/19      PT LONG TERM GOAL #7   Title  Complete community, work and/or recreational activities without limitation due to current condition.    Baseline  difficulty walking, with stairs, balance, walking over uneven ground, increased fall risk. (01/08/2019);    Time  8    Period  Weeks    Status  New    Target Date  03/06/19            Plan - 02/05/19 1852    Clinical Impression Statement   Patient tolerate the session well today and didn't have increase of knee pain at the end of the session. Patient presented improved pain control during the session. Patient also demonstrated good gluteal activation/contraction through functional exercises (sit <>stand). However, patient still presented weakness at gluteus medius by showing knee valgus at sit to stand which may exacerbate patient's knee pain while walking. Patient also presented with L heel early lift at terminal stance and extra time spent to further assess patient's ankle biomechanics for pain control. Will focus on gait, muscle strength, and balance training in later session. Patient will benefit continued skilled physical therapy for strengthening and balance to reduce fall risk and improve functional mobility.    Personal Factors and Comorbidities  Age;Comorbidity 3+;Past/Current Experience;Fitness;Time since onset of injury/illness/exacerbation    Comorbidities  frequent UTIs (comes and goes every 2-3 years), osteopenia, depression, Hx of depression, stress, hypertension, GERD.    Examination-Activity Limitations  Stairs;Stand;Bend;Sit;Lift;Caring for Others;Transfers;Squat;Carry;Other   walking on uneven ground   Examination-Participation Restrictions  Community Activity;Other;Yard Work   ifficulty walking, with stairs, balance, walking over uneven ground, increased fall risk   Stability/Clinical Decision Making  Evolving/Moderate complexity    Rehab Potential  Good    PT Frequency  2x / week    PT Duration  8 weeks    PT Treatment/Interventions  ADLs/Self Care Home Management;Cryotherapy;Moist Heat;Therapeutic exercise;Gait training;Stair training;Functional mobility training;Therapeutic activities;Balance training;Neuromuscular re-education;Patient/family education;Manual techniques;Passive range of motion;Dry needling;Spinal Manipulations;Joint Manipulations;Energy conservation   joint mobilizations grades I-IV   PT Next Visit  Plan  assess balance more formally    PT Home Exercise Plan  Medbridge Access Code: [email or web address]    Consulted and Agree with Plan of Care  Patient       Patient will benefit from skilled therapeutic intervention in order to improve the following deficits and impairments:  Abnormal gait, Decreased balance, Decreased endurance, Decreased mobility, Difficulty walking, Cardiopulmonary status limiting activity, Decreased range of motion, Impaired perceived functional ability, Improper body mechanics, Obesity, Pain, Decreased strength, Decreased safety awareness, Decreased coordination, Decreased activity tolerance  Visit Diagnosis: Muscle weakness (generalized)  Difficulty in walking, not elsewhere classified  Unsteadiness on feet  Left knee pain, unspecified chronicity     Problem List Patient Active Problem List   Diagnosis Date Noted  . Back pain 11/15/2018  . SOB (shortness of breath) 11/15/2018  . Decreased GFR 11/14/2018  . Hyperbilirubinemia 11/14/2018  . Abdominal bloating 12/09/2017  . Osteopenia 04/29/2017  . Hyperglycemia 04/27/2017  . Reflux esophagitis   . Heartburn   . Special screening for malignant neoplasms, colon   . Hair loss 10/26/2015  . Fatigue 10/21/2015  . Vitamin D  deficiency 03/15/2015  . Weight loss counseling, encounter for 02/22/2015  . Facial erythema 07/27/2014  . Health care maintenance 07/05/2014  . BP (high blood pressure) 03/26/2014  . Headache 03/08/2014  . Eyelid lesion 03/08/2014  . Shingles 11/16/2013  . Light headedness 06/22/2013  . Cough 05/18/2013  . GERD (gastroesophageal reflux disease) 05/18/2013  . Swelling of extremity 03/01/2013  . Left knee pain 03/01/2013  . Essential hypertension, benign 12/15/2012  . Frequent UTI 12/15/2012  . Depression 12/15/2012  . Environmental allergies 12/15/2012    .Nelly Routan Delancey Moraes, SPT 02/05/19, 7:23 PM  Luretha MurphySara R. Ilsa IhaSnyder, PT, DPT 02/05/19, 7:23 PM   [facility]  PHYSICAL AND SPORTS MEDICINE [address] [place], [place], [address] Phone: [phone]   Fax:  [phone]  Name: Susan Miller MRN: [id number] Date of Birth: July 12, 1945

## 2019-02-12 ENCOUNTER — Ambulatory Visit (INDEPENDENT_AMBULATORY_CARE_PROVIDER_SITE_OTHER): Payer: Medicare HMO | Admitting: Urology

## 2019-02-12 ENCOUNTER — Other Ambulatory Visit: Payer: Self-pay

## 2019-02-12 ENCOUNTER — Encounter: Payer: Self-pay | Admitting: Urology

## 2019-02-12 VITALS — BP 137/80 | HR 108 | Ht 67.0 in | Wt 217.0 lb

## 2019-02-12 DIAGNOSIS — N390 Urinary tract infection, site not specified: Secondary | ICD-10-CM | POA: Diagnosis not present

## 2019-02-12 DIAGNOSIS — N952 Postmenopausal atrophic vaginitis: Secondary | ICD-10-CM | POA: Diagnosis not present

## 2019-02-12 DIAGNOSIS — K5909 Other constipation: Secondary | ICD-10-CM

## 2019-02-12 DIAGNOSIS — N362 Urethral caruncle: Secondary | ICD-10-CM | POA: Diagnosis not present

## 2019-02-12 LAB — URINALYSIS, COMPLETE
Bilirubin, UA: NEGATIVE
Glucose, UA: NEGATIVE
Ketones, UA: NEGATIVE
Nitrite, UA: POSITIVE — AB
Specific Gravity, UA: 1.025 (ref 1.005–1.030)
Urobilinogen, Ur: 0.2 mg/dL (ref 0.2–1.0)
pH, UA: 5.5 (ref 5.0–7.5)

## 2019-02-12 LAB — MICROSCOPIC EXAMINATION: WBC, UA: >30 /hpf — AB (ref 0–5)

## 2019-02-12 MED ORDER — PREMARIN 0.625 MG/GM VA CREA: 1.0000 | TOPICAL_CREAM | Freq: Every day | VAGINAL | 12 refills | Status: DC

## 2019-02-12 NOTE — Progress Notes (Signed)
p  02/12/2019 9:48 AM   Susan Miller 27-Oct-1945 [id number]  Referring provider: Einar Pheasant, [address] [place],  [place] [address]  Chief Complaint  Patient presents with  . Recurrent UTI    HPI: 73 year old female referred for further evaluation of recurrent urinary tract infections.  She is had 3 documented urinary tract infections over the past year.  She grew E. coli in 02/26/2018 followed by Klebsiella on 04/15/2018 and E. coli again on 04/30/2018.  She was seen and evaluated on 01/06/2019 with urinary tract symptoms (increased urgency/ frequency) however her urine culture grew low colony count of mixed flora.  Her urinalysis at the time was fairly unremarkable with small leukocytes, 10-20, otherwise a completely unremarkable urinalysis.  Notably, the patient gave a routine urinalysis at her annual exam on 11/12/2018 which is similar appearance.  She was asymptomatic at that time.  She often reports that she will get infections over the weekend have to go to urgent care or controlled a walk-in.  She is also utilized online services to treat her UTIs without given urine culture.  Symptoms the urinary tract infections include urgency, frequency, dysuria.  No febrile UTIs.  She will occasionally have some urge incontinence with infections as well.  No baseline urgency frequency.  Rare occasional stress urinary incontinence with only strong sneezes.  She is postmenopausal.  She has all her GYN organs.  Status post vaginal delivery.  She is not sexually active.  She struggles with chronic constipation.  This is been a lifelong issue for her.  She has hemorrhoids.  She will have a bowel movement about every third day which is large and hard and often painful to pass.  She is not on any sort of bowel regimen.  She drinks primarily coffee and tea.  No significant water intake.  She is unable to swallow pill.  She does drink a day on and yogurt with live  active culture every few days.  She has cranberry chews but does not use these regularly.  She does report that she used topical estrogen cream in the remote past, perhaps greater than 10 years ago but has not been using it since.   PMH: Past Medical History:  Diagnosis Date  . Allergy   . Depression   . History of chicken pox   . Hx: UTI (urinary tract infection)   . Hypertension     Surgical History: Past Surgical History:  Procedure Laterality Date  . COLONOSCOPY WITH PROPOFOL N/A 06/20/2016   Procedure: COLONOSCOPY WITH PROPOFOL;  Surgeon: Lucilla Lame, MD;  Location: [facility] ENDOSCOPY;  Service: Endoscopy;  Laterality: N/A;  . ESOPHAGOGASTRODUODENOSCOPY (EGD) WITH PROPOFOL N/A 06/20/2016   Procedure: ESOPHAGOGASTRODUODENOSCOPY (EGD) WITH PROPOFOL;  Surgeon: Lucilla Lame, MD;  Location: [facility] ENDOSCOPY;  Service: Endoscopy;  Laterality: N/A;  . TONSILLECTOMY     as a child    Home Medications:  Allergies as of 02/12/2019      Reactions   Codeine Hives   Iodinated Diagnostic Agents Swelling   Nitrofurantoin Monohyd Macro Nausea Only      Medication List       Accurate as of February 12, 2019  9:48 AM. If you have any questions, ask your nurse or doctor.        STOP taking these medications   cefdinir 250 MG/5ML suspension Commonly known as: OMNICEF Stopped by: Hollice Espy, MD     TAKE these medications   esomeprazole 40 MG capsule Commonly known as:  NEXIUM TAKE 1 CAPSULE DAILY BEFORE BREAKFAST   lisinopril-hydrochlorothiazide 10-12.5 MG tablet Commonly known as: ZESTORETIC TAKE 1 TABLET EVERY DAY   sertraline 25 MG tablet Commonly known as: ZOLOFT TAKE 1 TABLET EVERY DAY       Allergies:  Allergies  Allergen Reactions  . Codeine Hives  . Iodinated Diagnostic Agents Swelling  . Nitrofurantoin Monohyd Macro Nausea Only    Family History: Family History  Problem Relation Age of Onset  . Hypertension Mother   . Cancer Sister        colon cancer  .  Hypertension Sister   . Diabetes Sister   . Hypertension Brother   . Hypertension Maternal Aunt   . Breast cancer Maternal Aunt 60  . Hypertension Maternal Uncle   . Stroke Maternal Grandmother   . Hypertension Maternal Grandmother     Social History:  reports that she has never smoked. She has never used smokeless tobacco. She reports that she does not drink alcohol or use drugs.  ROS: UROLOGY Frequent Urination?: No Hard to postpone urination?: No Burning/pain with urination?: Yes Get up at night to urinate?: Yes Leakage of urine?: Yes Urine stream starts and stops?: No Trouble starting stream?: No Do you have to strain to urinate?: No Blood in urine?: No Urinary tract infection?: Yes Sexually transmitted disease?: No Injury to kidneys or bladder?: No Painful intercourse?: No Weak stream?: No Currently pregnant?: No Vaginal bleeding?: No  Gastrointestinal Nausea?: No Vomiting?: No Indigestion/heartburn?: No Diarrhea?: No Constipation?: No  Constitutional Fever: No Night sweats?: No Weight loss?: No Fatigue?: No  Skin Skin rash/lesions?: No Itching?: No  Eyes Blurred vision?: No Double vision?: No  Ears/Nose/Throat Sore throat?: No Sinus problems?: No  Hematologic/Lymphatic Swollen glands?: No Easy bruising?: No  Cardiovascular Leg swelling?: No Chest pain?: No  Respiratory Cough?: Yes Shortness of breath?: Yes  Endocrine Excessive thirst?: No  Musculoskeletal Back pain?: No Joint pain?: No  Neurological Headaches?: No Dizziness?: No  Psychologic Depression?: No Anxiety?: No  Physical Exam: BP 137/80   Pulse (!) 108   Ht 5\' 7"  (1.702 m)   Wt 217 lb (98.4 kg)   BMI 33.99 kg/m   Constitutional:  Alert and oriented, No acute distress. HEENT: [place] AT, moist mucus membranes.  Trachea midline, no masses. Cardiovascular: No clubbing, cyanosis, or edema. Respiratory: Normal respiratory effort, no increased work of breathing. GI:  Abdomen is soft, nontender, nondistended, no abdominal masses Pelvic: Chaperoned by , CMA.  Normal external genitalia.  Significant atrophic vaginitis appreciated with some mild vaginal stenosis with good vaginal vault support.  Slightly hypermobile urethra meatus without demonstrable stress urinary incontinence.  Small urethral carbuncle at the 6 o'clock position. Skin: No rashes, bruises or suspicious lesions. Neurologic: Grossly intact, no focal deficits, moving all 4 extremities. Psychiatric: Normal mood and affect.  Laboratory Data: Lab Results  Component Value Date   WBC 10.9 (H) 11/09/2018   HGB 12.3 11/09/2018   HCT 36.9 11/09/2018   MCV 88.3 11/09/2018   PLT 272 11/09/2018    Lab Results  Component Value Date   CREATININE 0.97 12/24/2018    Lab Results  Component Value Date   HGBA1C 6.1 (A) 11/13/2018   HGBA1C 6.1 11/13/2018    Urinalysis Pending-asymptomatic today  Pertinent Imaging:  Results for orders placed during the hospital encounter of 03/24/15  03/26/15 Renal   Narrative CLINICAL DATA:  Recurrent urinary tract infections, history of kidney stones  EXAM: RENAL / URINARY TRACT ULTRASOUND COMPLETE  COMPARISON:  None.  FINDINGS: Right Kidney:  Length: 12.1 cm.  No hydronephrosis is seen.  Left Kidney:  Length: 11.0 cm.  No hydronephrosis is noted.  Bladder:  The urinary bladder is unremarkable.  IMPRESSION: Negative ultrasound of the kidneys.   Electronically Signed   By: Dwyane DeePaul  [place] M.D.   On: 03/24/2015 12:57     Assessment & Plan:    1. Recurrent UTI We discussed the differential diagnosis of dysuria, urgency frequency which may or may not be related to infection as demonstrated by her last urinalysis and culture  I strongly recommend that she be seen and evaluated here in our office if she has signs or symptoms of urinary tract infection for same-day visit discussed whether or not these are true infections.  In addition  to the above, I recommended hygiene and behavioral modification as listed below.  Recommend daily probiotic, cranberry chews twice daily given that she is unable to tolerate pills, transition to drinking more water amongst others.  2. Atrophic vaginitis Fairly significant vaginal atrophy on exam today likely contributing factor to recurrent infections  She is used topical estrogen cream in the past.  I recommended using a small pea-sized amount per urethral meatus 3 times a week.  She is comfortable with this.  We discussed risks/benefits.  3. Urethral caruncle Small urethral caruncle, irritation may also cause dysuria, should respond to topical estrogen cream  4. Chronic constipation Lengthy discussion today about bowel bladder syndrome and how constipation may be playing into her recurrent urinary tract infections and urinary symptoms  If strongly recommended that she start a bowel cleanout and then get on a bowel regimen  Normally I recommend Colace but given that she is unable to swallow a capsule, she may consider an agent such as Metamucil daily for fiber/stool bulking.  Follow-up as needed for same-day visit for recurrent infection  Vanna ScotlandAshley Carla Whilden, MD  [facility] [address], [address] [place], [place] [address] [phone]

## 2019-02-12 NOTE — Addendum Note (Signed)
Addended by: Verlene Mayer A on: 02/12/2019 10:56 AM   Modules accepted: Orders

## 2019-02-12 NOTE — Addendum Note (Signed)
Addended by: Verlene [place] A on: 02/12/2019 11:32 AM   Modules accepted: Orders

## 2019-02-12 NOTE — Addendum Note (Signed)
Addended by: Verlene [place] A on: 02/12/2019 11:34 AM   Modules accepted: Orders

## 2019-03-10 ENCOUNTER — Encounter: Payer: Self-pay | Admitting: Internal Medicine

## 2019-03-11 NOTE — Telephone Encounter (Signed)
Will she need a new referral. The note says referral was closed since they were unable to contact her.

## 2019-03-21 ENCOUNTER — Encounter: Payer: Self-pay | Admitting: Cardiology

## 2019-03-21 ENCOUNTER — Ambulatory Visit (INDEPENDENT_AMBULATORY_CARE_PROVIDER_SITE_OTHER): Payer: Medicare HMO | Admitting: Cardiology

## 2019-03-21 ENCOUNTER — Other Ambulatory Visit: Payer: Self-pay

## 2019-03-21 VITALS — BP 150/82 | HR 83 | Temp 97.0°F | Ht 67.5 in | Wt 214.8 lb

## 2019-03-21 DIAGNOSIS — I10 Essential (primary) hypertension: Secondary | ICD-10-CM

## 2019-03-21 DIAGNOSIS — R0602 Shortness of breath: Secondary | ICD-10-CM

## 2019-03-21 NOTE — Progress Notes (Signed)
Cardiology Office Note:    Date:  03/21/2019   ID:  Susan SensingLinda H Bellantoni, DOB May 23, 1945, MRN [id number]  PCP:  Dale DurhamScott, Charlene, MD  Cardiologist:  Debbe OdeaBrian Agbor-Etang, MD  Electrophysiologist:  None   Referring MD: Dale DurhamScott, Charlene, MD   Chief Complaint  Patient presents with  . New Patient (Initial Visit)    SOB with exertion; Meds verbally reviewed with patient.    History of Present Illness:    Susan Miller is a 73 y.o. female with a hx of hypertension who presents due to shortness of breath.  Patient states having on and off feeling of shortness of breath starting 7 months ago after the lockdown due to the COVID-19 pandemic.  She occasionally has symptoms while vacuuming.  She attributes her symptoms due to deconditioning, weight gain and stress.  She states the whole situation of the pandemic, did not use, Facebook, her mentally ill son who is in his 4940s living at home with her has caused her tremendous stress.  She denies chest pain, palpitations, edema, orthopnea.  She bought an exercise bike about 1 week ago.  She has been cycling for 45 minutes straight without any symptoms of chest pain or shortness of breath.  Last time she increase her tolerance to 8 minutes without any symptoms of chest pain or shortness of breath.  She did have some muscle soreness in her legs.  Overall she believes her symptoms of shortness of breath have improved since she started exercising, stop watching the news, getting off Facebook and trying to decrease her stress and anxiety levels.  She states her blood pressure at home is usually normal.  Past Medical History:  Diagnosis Date  . Allergy   . Depression   . History of chicken pox   . Hx: UTI (urinary tract infection)   . Hypertension     Past Surgical History:  Procedure Laterality Date  . COLONOSCOPY WITH PROPOFOL N/A 06/20/2016   Procedure: COLONOSCOPY WITH PROPOFOL;  Surgeon: Midge Miniumarren Wohl, MD;  Location: [facility];  Service: Endoscopy;   Laterality: N/A;  . ESOPHAGOGASTRODUODENOSCOPY (EGD) WITH PROPOFOL N/A 06/20/2016   Procedure: ESOPHAGOGASTRODUODENOSCOPY (EGD) WITH PROPOFOL;  Surgeon: Midge Miniumarren Wohl, MD;  Location: [facility];  Service: Endoscopy;  Laterality: N/A;  . TONSILLECTOMY     as a child  . TUMOR REMOVAL      Current Medications: Current Meds  Medication Sig  . conjugated estrogens (PREMARIN) vaginal cream Place 1 Applicatorful vaginally daily. Use pea sized amount M-W-Fr before bedtime  . esomeprazole (NEXIUM) 40 MG capsule TAKE 1 CAPSULE DAILY BEFORE BREAKFAST  . lisinopril-hydrochlorothiazide (ZESTORETIC) 10-12.5 MG tablet TAKE 1 TABLET EVERY DAY  . sertraline (ZOLOFT) 25 MG tablet TAKE 1 TABLET EVERY DAY     Allergies:   Codeine, Iodinated diagnostic agents, and Nitrofurantoin monohyd macro   Social History   Socioeconomic History  . Marital status: Divorced    Spouse name: Not on file  . Number of children: Not on file  . Years of education: Not on file  . Highest education level: Not on file  Occupational History  . Not on file  Social Needs  . Financial resource strain: Not hard at all  . Food insecurity    Worry: Never true    Inability: Never true  . Transportation needs    Medical: No    Non-medical: No  Tobacco Use  . Smoking status: Never Smoker  . Smokeless tobacco: Never Used  Substance and Sexual Activity  .  Alcohol use: No    Alcohol/week: 0.0 standard drinks  . Drug use: No  . Sexual activity: Not Currently  Lifestyle  . Physical activity    Days per week: 0 days    Minutes per session: Not on file  . Stress: Not on file  Relationships  . Social Herbalist on phone: Not on file    Gets together: Not on file    Attends religious service: Not on file    Active member of club or organization: Not on file    Attends meetings of clubs or organizations: Not on file    Relationship status: Not on file  Other Topics Concern  . Not on file  Social History  Narrative  . Not on file     Family History: The patient's family history includes Breast cancer (age of onset: 69) in her maternal aunt; Cancer in her sister; Diabetes in her sister; Hypertension in her brother, maternal aunt, maternal grandmother, maternal uncle, mother, and sister; Stroke in her maternal grandmother.  ROS:   Please see the history of present illness.     All other systems reviewed and are negative.  EKGs/Labs/Other Studies Reviewed:    The following studies were reviewed today:   EKG:  EKG is  ordered today.  The ekg ordered today demonstrates normal sinus rhythm, normal ECG.  Recent Labs: 05/13/2018: TSH 3.71 11/09/2018: B Natriuretic Peptide 90.0; Hemoglobin 12.3; Platelets 272 12/24/2018: ALT 14; BUN 15; Creatinine, Ser 0.97; Potassium 3.9; Sodium 139  Recent Lipid Panel    Component Value Date/Time   CHOL 151 11/12/2018 0802   CHOL 182 04/23/2017 0759   TRIG 80.0 11/12/2018 0802   HDL 46.20 11/12/2018 0802   HDL 43 04/23/2017 0759   CHOLHDL 3 11/12/2018 0802   VLDL 16.0 11/12/2018 0802   LDLCALC 89 11/12/2018 0802   LDLCALC 120 (H) 04/23/2017 0759    Physical Exam:    VS:  BP (!) 150/82 (BP Location: Right Arm, Patient Position: Sitting, Cuff Size: Normal)   Pulse 83   Temp (!) 97 F (36.1 C)   Ht 5' 7.5" (1.715 m)   Wt 214 lb 12 oz (97.4 kg)   SpO2 97%   BMI 33.14 kg/m     Wt Readings from Last 3 Encounters:  03/21/19 214 lb 12 oz (97.4 kg)  02/12/19 217 lb (98.4 kg)  12/24/18 219 lb (99.3 kg)     GEN:  Well nourished, well developed in no acute distress HEENT: Normal NECK: No JVD; No carotid bruits LYMPHATICS: No lymphadenopathy CARDIAC: RRR, no murmurs, rubs, gallops RESPIRATORY:  Clear to auscultation without rales, wheezing or rhonchi  ABDOMEN: Soft, non-tender, non-distended MUSCULOSKELETAL:  No edema; No deformity  SKIN: Warm and dry NEUROLOGIC:  Alert and oriented x 3 PSYCHIATRIC:  Normal affect   ASSESSMENT:   Patient's  symptoms of shortness of breath are likely secondary to deconditioning.  She is able to exercise on a stationary bike for 8 minutes without symptoms of chest pain or shortness of breath.  Her symptoms have been improving slightly since beginning her exercise regimen.  Symptoms of shortness of breath do not sound like an anginal equivalent or of cardiac etiology.  Especially as she is able to exercise for up to 8 minutes on her bike without any symptoms.  Symptoms have actually been improving.  Blood pressure is elevated today.  Her blood pressure is usually normal.  1. SOB (shortness of breath)   2.  Essential hypertension    PLAN:    In order of problems listed above:  1. Likely deconditioning and or not of cardiac origin due to reasons mentioned above.  Patient reassured.  Patient has been educated on cardiac symptoms, anginal symptoms.  If symptoms persist or become worrisome we may consider further cardiac testing. 2. Blood pressure elevated.  Usually normal.  Patient will follow up with PCP.  If it stays elevated I recommend she takes her blood pressure pill twice daily instead of once.  Advised her to check her blood pressure daily.  Follow-up as needed  This note was generated in part or whole with voice recognition software. Voice recognition is usually quite accurate but there are transcription errors that can and very often do occur. I apologize for any typographical errors that were not detected and corrected.    Medication Adjustments/Labs and Tests Ordered: Current medicines are reviewed at length with the patient today.  Concerns regarding medicines are outlined above.  Orders Placed This Encounter  Procedures  . EKG 12-Lead   No orders of the defined types were placed in this encounter.   Patient Instructions  Medication Instructions:  Your physician recommends that you continue on your current medications as directed. Please refer to the Current Medication list given to  you today.  *If you need a refill on your cardiac medications before your next appointment, please call your pharmacy*  Lab Work: None ordered If you have labs (blood work) drawn today and your tests are completely normal, you will receive your results only by: Marland Kitchen MyChart Message (if you have MyChart) OR . A paper copy in the mail If you have any lab test that is abnormal or we need to change your treatment, we will call you to review the results.  Testing/Procedures: None ordered  Follow-Up: At [facility], you and your health needs are our priority.  As part of our continuing mission to provide you with exceptional heart care, we have created designated Provider Care Teams.  These Care Teams include your primary Cardiologist (physician) and Advanced Practice Providers (APPs -  Physician Assistants and Nurse Practitioners) who all work together to provide you with the care you need, when you need it.  Your next appointment:   As needed  The format for your next appointment:   In Person  Provider:   Dr. Myriam Forehand- Sandie Ano  Other Instructions N/A     Signed, Debbe Odea, MD  03/21/2019 12:03 PM    [facility]

## 2019-03-21 NOTE — Patient Instructions (Signed)
Medication Instructions:  Your physician recommends that you continue on your current medications as directed. Please refer to the Current Medication list given to you today.  *If you need a refill on your cardiac medications before your next appointment, please call your pharmacy*  Lab Work: None ordered If you have labs (blood work) drawn today and your tests are completely normal, you will receive your results only by: Marland Kitchen MyChart Message (if you have MyChart) OR . A paper copy in the mail If you have any lab test that is abnormal or we need to change your treatment, we will call you to review the results.  Testing/Procedures: None ordered  Follow-Up: At [facility], you and your health needs are our priority.  As part of our continuing mission to provide you with exceptional heart care, we have created designated Provider Care Teams.  These Care Teams include your primary Cardiologist (physician) and Advanced Practice Providers (APPs -  Physician Assistants and Nurse Practitioners) who all work together to provide you with the care you need, when you need it.  Your next appointment:   As needed  The format for your next appointment:   In Person  Provider:   Dr. Mylo Red- Charlestine Night  Other Instructions N/A

## 2019-03-24 ENCOUNTER — Encounter: Payer: Self-pay | Admitting: Physical Therapy

## 2019-03-24 DIAGNOSIS — M6281 Muscle weakness (generalized): Secondary | ICD-10-CM

## 2019-03-24 DIAGNOSIS — R262 Difficulty in walking, not elsewhere classified: Secondary | ICD-10-CM

## 2019-03-24 DIAGNOSIS — R2681 Unsteadiness on feet: Secondary | ICD-10-CM

## 2019-03-24 DIAGNOSIS — M25562 Pain in left knee: Secondary | ICD-10-CM

## 2019-03-24 NOTE — Therapy (Signed)
[facility] [address] [place], [address] Phone: [phone]   Fax:  [phone]  Physical Therapy No-Visit Discharge Summary Reporting period: 01/08/2019 - 03/24/2019  Patient Details  Name: Susan Miller MRN: [id number] Date of Birth: [birth year] Referring Provider (PT): Einar Pheasant, MD   Encounter Date: 03/24/2019    Past Medical History:  Diagnosis Date  . Allergy   . Depression   . History of chicken pox   . Hx: UTI (urinary tract infection)   . Hypertension     Past Surgical History:  Procedure Laterality Date  . COLONOSCOPY WITH PROPOFOL N/A 06/20/2016   Procedure: COLONOSCOPY WITH PROPOFOL;  Surgeon: Lucilla Lame, MD;  Location: [facility];  Service: Endoscopy;  Laterality: N/A;  . ESOPHAGOGASTRODUODENOSCOPY (EGD) WITH PROPOFOL N/A 06/20/2016   Procedure: ESOPHAGOGASTRODUODENOSCOPY (EGD) WITH PROPOFOL;  Surgeon: Lucilla Lame, MD;  Location: [facility];  Service: Endoscopy;  Laterality: N/A;  . TONSILLECTOMY     as a child  . TUMOR REMOVAL      There were no vitals filed for this visit.  Subjective Assessment - 03/24/19 1324    Subjective  Patient did not return for further appointments and did not return call when clinician attempted to contact.    Pertinent History  Patient is a 73 y.o. female who presents to outpatient physical therapy with a referral for medical diagnosis L knee pain. This patient's chief complaints consist of L knee pain, stiffness, limping during walking leading to the following functional deficits: difficulty walking, with stairs, balance, walking over uneven ground, increased fall risk. Relevant past medical history and comorbidities include frequent UTIs (comes and goes every 2-3 years), osteopenia, depression, Hx of depression, stress, hypertension, GERD.    Limitations  Walking;Standing;House hold activities;Other (comment)   stairs   Diagnostic tests   radiograph report  L knee (12/24/2018): "FINDINGS:No evidence of fracture, dislocation, or joint effusion. No evidence of arthropathy or other focal bone abnormality. Soft tissues are unremarkable."    Patient Stated Goals  "stop limping"    Pain Onset  More than a month ago       OBJECTIVE Patient is not present for examination at this time. Please see previous documentation for latest objective data.     PT Short Term Goals - 01/22/19 1329      PT SHORT TERM GOAL #1   Title  Be independent with initial home exercise program for self-management of symptoms.    Baseline  provided with initial HEP (01/08/2019);    Time  2    Period  Weeks    Status  Achieved    Target Date  01/23/19        PT Long Term Goals - 03/24/19 1326      PT LONG TERM GOAL #1   Title  Be independent with a long-term home exercise program for self-management of symptoms.    Baseline  Initial HEP provided at [facility] (01/08/2019); was provided progressive and appropriate HEP as she progressed through care, but discontinued care prior to final HEP planned (03/24/2019)    Time  8    Period  Weeks    Status  Partially Met    Target Date  03/06/19      PT LONG TERM GOAL #2   Title  Patient will demonstrate left knee strength equal or greater 5/5 and B hip strength equal or greater than 4+/5 to demonstrate functional strength for improved gait, increased standing tolerance,  decreased fall risk, increased stair climbing ability.    Baseline  see objective exam (01/08/2019);    Time  8    Period  Weeks    Status  Unable to assess    Target Date  03/06/19      PT LONG TERM GOAL #3   Title  Patient will complete 5TSTS test in equal or less than 11 seconds to demonstrate improved LE strength and power for functional activities such as stairs, walking, transferring.    Baseline  16 seconds (01/08/2019);    Time  8    Period  Weeks    Status  Unable to assess    Target Date  03/06/19      PT LONG TERM GOAL #4   Title   Patient will be able to heel walk x 10 steps with BUE support without buckling or knee pain to improved functional balance and LE stabilty for ambulation.    Baseline  Heel walking: two trials = L knee buckled and painful, required BUE support (01/08/2019);    Time  8    Period  Weeks    Status  Unable to assess    Target Date  03/06/19      PT LONG TERM GOAL #5   Title  Patinet will demonstrate normal gait pattern to improve ambulation safety and ability during household and community ambulation.    Baseline  Gait: antalgic gait favoring L LE, lacks knee extension and flexion, toe walks on L LE, lack of hip flexion L side. 03/24/2019: improved but not yet normal last session observed (02/05/2019)    Time  8    Period  Weeks    Status  Partially Met    Target Date  03/06/19      PT LONG TERM GOAL #6   Title  Demonstrate improved FOTO score by 10 units to demonstrate improvement in overall condition and self-reported functional ability.    Baseline  FOTO = 52 (01/08/2019);    Time  8    Period  Weeks    Status  Unable to assess    Target Date  03/06/19      PT LONG TERM GOAL #7   Title  Complete community, work and/or recreational activities without limitation due to current condition.    Baseline  difficulty walking, with stairs, balance, walking over uneven ground, increased fall risk. (01/08/2019); reported improvements    Time  8    Period  Weeks    Status  Partially Met    Target Date  03/06/19            Plan - 03/24/19 1330    Clinical Impression Statement  Patient attended 8 physical therapy sessions and was making overall progress towards goals per observation and patient report until she stopped attending and did not return attempts to communicate with her. Unable to formally measure several goals due to unexpected discharge. Patient is now being discharged due to lack of participation.    Personal Factors and Comorbidities  Age;Comorbidity 3+;Past/Current  Experience;Fitness;Time since onset of injury/illness/exacerbation    Comorbidities  frequent UTIs (comes and goes every 2-3 years), osteopenia, depression, Hx of depression, stress, hypertension, GERD.    Examination-Activity Limitations  Stairs;Stand;Bend;Sit;Lift;Caring for Others;Transfers;Squat;Carry;Other   walking on uneven ground   Examination-Participation Restrictions  Community Activity;Other;Yard Work   ifficulty walking, with stairs, balance, walking over uneven ground, increased fall risk   Stability/Clinical Decision Making  Evolving/Moderate complexity    Rehab Potential  Good    PT Frequency  2x / week    PT Duration  8 weeks    PT Treatment/Interventions  ADLs/Self Care Home Management;Cryotherapy;Moist Heat;Therapeutic exercise;Gait training;Stair training;Functional mobility training;Therapeutic activities;Balance training;Neuromuscular re-education;Patient/family education;Manual techniques;Passive range of motion;Dry needling;Spinal Manipulations;Joint Manipulations;Energy conservation   joint mobilizations grades I-IV   PT Next Visit Plan  Patient is now discharged due to lack of participation    PT Home Exercise Plan  Medbridge Access Code: [email or web address]    Consulted and Agree with Plan of Care  Patient       Patient will benefit from skilled therapeutic intervention in order to improve the following deficits and impairments:  Abnormal gait, Decreased balance, Decreased endurance, Decreased mobility, Difficulty walking, Cardiopulmonary status limiting activity, Decreased range of motion, Impaired perceived functional ability, Improper body mechanics, Obesity, Pain, Decreased strength, Decreased safety awareness, Decreased coordination, Decreased activity tolerance  Visit Diagnosis: Muscle weakness (generalized)  Difficulty in walking, not elsewhere classified  Unsteadiness on feet  Left knee pain, unspecified chronicity     Problem List Patient Active Problem List    Diagnosis Date Noted  . Back pain 11/15/2018  . SOB (shortness of breath) 11/15/2018  . Decreased GFR 11/14/2018  . Hyperbilirubinemia 11/14/2018  . Abdominal bloating 12/09/2017  . Osteopenia 04/29/2017  . Hyperglycemia 04/27/2017  . Reflux esophagitis   . Heartburn   . Special screening for malignant neoplasms, colon   . Hair loss 10/26/2015  . Fatigue 10/21/2015  . Vitamin D deficiency 03/15/2015  . Weight loss counseling, encounter for 02/22/2015  . Facial erythema 07/27/2014  . Health care maintenance 07/05/2014  . BP (high blood pressure) 03/26/2014  . Headache 03/08/2014  . Eyelid lesion 03/08/2014  . Shingles 11/16/2013  . Light headedness 06/22/2013  . Cough 05/18/2013  . GERD (gastroesophageal reflux disease) 05/18/2013  . Swelling of extremity 03/01/2013  . Left knee pain 03/01/2013  . Essential hypertension, benign 12/15/2012  . Frequent UTI 12/15/2012  . Depression 12/15/2012  . Environmental allergies 12/15/2012    Everlean Alstrom. Graylon Good, PT, DPT 03/24/19, 1:31 PM  [facility] [address] [place], [address] Phone: [phone]   Fax:  [phone]  Name: Susan Miller MRN: [id number] Date of Birth: 05/06/1946

## 2019-04-08 ENCOUNTER — Encounter: Payer: Self-pay | Admitting: Internal Medicine

## 2019-04-08 ENCOUNTER — Ambulatory Visit (INDEPENDENT_AMBULATORY_CARE_PROVIDER_SITE_OTHER): Payer: Medicare HMO | Admitting: Internal Medicine

## 2019-04-08 DIAGNOSIS — F320 Major depressive disorder, single episode, mild: Secondary | ICD-10-CM | POA: Diagnosis not present

## 2019-04-08 DIAGNOSIS — K219 Gastro-esophageal reflux disease without esophagitis: Secondary | ICD-10-CM

## 2019-04-08 DIAGNOSIS — R739 Hyperglycemia, unspecified: Secondary | ICD-10-CM | POA: Diagnosis not present

## 2019-04-08 DIAGNOSIS — I10 Essential (primary) hypertension: Secondary | ICD-10-CM

## 2019-04-08 DIAGNOSIS — M25562 Pain in left knee: Secondary | ICD-10-CM

## 2019-04-08 DIAGNOSIS — F32A Depression, unspecified: Secondary | ICD-10-CM

## 2019-04-08 DIAGNOSIS — N390 Urinary tract infection, site not specified: Secondary | ICD-10-CM

## 2019-04-08 NOTE — Progress Notes (Addendum)
[name], female   DOB: 08-28-1945, 73 y.o.   MRN: [id number]   Virtual Visit via telephone Note  This visit type was conducted due to national recommendations for restrictions regarding the COVID-19 pandemic (e.g. social distancing).  This format is felt to be most appropriate for this patient at this time.  All issues noted in this document were discussed and addressed.  No physical exam was performed (except for noted visual exam findings with Video Visits).   I connected with Cordelia Poche by telephone and verified that I am speaking with the correct person using two identifiers. Location patient: home Location provider: work Persons participating in the telephone visit: patient, provider  I discussed the limitations, risks, security and privacy concerns of performing an evaluation and management service by telephone and the availability of in person appointments.  The patient expressed understanding and agreed to proceed.   Reason for visit: scheduled follow up  HPI: She reports she is doing relatively well.  Increased stress with her son's medical issues.  Discussed with her today.  She feels she is handling things relatively well.  Does not feel needs any further intervention at this time.  Continue zoloft.  Staying in due to covid restrictions.  No headache.  No chest pain or tightness.  No acid reflux.  No abdominal pain.  Bowels moving.  Evaluated by cardiology.  Ok.  Was questioning - deconditioning.  Seeing urology.  Using estrogen cream.  No further uti's.  Knee pain persistent.  Went to therapy.  Helped some.  Still with persistent pain and some limitations.  Discussed referral to ortho.     ROS: See pertinent positives and negatives per HPI.  Past Medical History:  Diagnosis Date  . Allergy   . Depression   . History of chicken pox   . Hx: UTI (urinary tract infection)   . Hypertension     Past Surgical History:  Procedure Laterality Date  .  COLONOSCOPY WITH PROPOFOL N/A 06/20/2016   Procedure: COLONOSCOPY WITH PROPOFOL;  Surgeon: Lucilla Lame, MD;  Location: [facility];  Service: Endoscopy;  Laterality: N/A;  . ESOPHAGOGASTRODUODENOSCOPY (EGD) WITH PROPOFOL N/A 06/20/2016   Procedure: ESOPHAGOGASTRODUODENOSCOPY (EGD) WITH PROPOFOL;  Surgeon: Lucilla Lame, MD;  Location: [facility];  Service: Endoscopy;  Laterality: N/A;  . TONSILLECTOMY     as a child  . TUMOR REMOVAL      Family History  Problem Relation Age of Onset  . Hypertension Mother   . Cancer Sister        colon cancer  . Hypertension Sister   . Diabetes Sister   . Hypertension Brother   . Hypertension Maternal Aunt   . Breast cancer Maternal Aunt 60  . Hypertension Maternal Uncle   . Stroke Maternal Grandmother   . Hypertension Maternal Grandmother     SOCIAL HX: reviewed.    Current Outpatient Medications:  .  conjugated estrogens (PREMARIN) vaginal cream, Place 1 Applicatorful vaginally daily. Use pea sized amount M-W-Fr before bedtime, Disp: 42.5 g, Rfl: 12 .  esomeprazole (NEXIUM) 40 MG capsule, TAKE 1 CAPSULE DAILY BEFORE BREAKFAST, Disp: 90 capsule, Rfl: 0 .  lisinopril-hydrochlorothiazide (ZESTORETIC) 10-12.5 MG tablet, TAKE 1 TABLET EVERY DAY, Disp: 90 tablet, Rfl: 1 .  sertraline (ZOLOFT) 25 MG tablet, TAKE 1 TABLET EVERY DAY, Disp: 90 tablet, Rfl: 1  EXAM:  GENERAL: alert, oriented, appears well and in no acute distress  HEENT: atraumatic, conjunttiva clear, no obvious abnormalities on inspection of external  nose and ears  NECK: normal movements of the head and neck  LUNGS: on inspection no signs of respiratory distress, breathing rate appears normal, no obvious gross SOB, gasping or wheezing  CV: no obvious cyanosis  PSYCH/NEURO: pleasant and cooperative, no obvious depression or anxiety, speech and thought processing grossly intact  ASSESSMENT AND PLAN:  Discussed the following assessment and plan:  Essential hypertension,  benign She will spot check her pressure.  Continue current medication regimen.  Follow pressures.  Follow metabolic panel.   Frequent UTI Seeing urology.  Using estrogen cream.  Doing well.   GERD (gastroesophageal reflux disease) On nexium.  Controlled.    Hyperglycemia Low carb diet and exercise.  Follow met b and a1c.   Left knee pain Persistent knee pain.  Has been to therapy.  Refer to ortho.   Mild depression (HCC) Stable on zoloft.  Follow.      I discussed the assessment and treatment plan with the patient. The patient was provided an opportunity to ask questions and all were answered. The patient agreed with the plan and demonstrated an understanding of the instructions.   The patient was advised to call back or seek an in-person evaluation if the symptoms worsen or if the condition fails to improve as anticipated.   Einar Pheasant, MD

## 2019-04-12 ENCOUNTER — Encounter: Payer: Self-pay | Admitting: Internal Medicine

## 2019-04-12 NOTE — Assessment & Plan Note (Signed)
Stable on zoloft.  Follow.   

## 2019-04-12 NOTE — Assessment & Plan Note (Signed)
Low carb diet and exercise.  Follow met b and a1c.  

## 2019-04-12 NOTE — Addendum Note (Signed)
Addended by: Alisa Graff on: 04/12/2019 10:53 PM   Modules accepted: Orders

## 2019-04-12 NOTE — Assessment & Plan Note (Signed)
On nexium.  Controlled.   

## 2019-04-12 NOTE — Assessment & Plan Note (Signed)
Persistent knee pain.  Has been to therapy.  Refer to ortho.

## 2019-04-12 NOTE — Assessment & Plan Note (Signed)
Seeing urology.  Using estrogen cream.  Doing well.

## 2019-04-12 NOTE — Assessment & Plan Note (Signed)
She will spot check her pressure.  Continue current medication regimen.  Follow pressures.  Follow metabolic panel.

## 2019-04-29 ENCOUNTER — Other Ambulatory Visit: Payer: Medicare HMO

## 2019-05-13 DIAGNOSIS — M25562 Pain in left knee: Secondary | ICD-10-CM | POA: Diagnosis not present

## 2019-05-13 DIAGNOSIS — G8929 Other chronic pain: Secondary | ICD-10-CM | POA: Diagnosis not present

## 2019-05-13 DIAGNOSIS — M1712 Unilateral primary osteoarthritis, left knee: Secondary | ICD-10-CM | POA: Diagnosis not present

## 2019-05-14 ENCOUNTER — Ambulatory Visit (INDEPENDENT_AMBULATORY_CARE_PROVIDER_SITE_OTHER): Payer: Medicare HMO

## 2019-05-14 ENCOUNTER — Other Ambulatory Visit: Payer: Self-pay

## 2019-05-14 VITALS — Ht 67.0 in | Wt 215.0 lb

## 2019-05-14 DIAGNOSIS — Z0000 Encounter for general adult medical examination without abnormal findings: Secondary | ICD-10-CM

## 2019-05-14 NOTE — Patient Instructions (Addendum)
  [name] , Thank you for taking time to come for your Medicare Wellness Visit. I appreciate your ongoing commitment to your health goals. Please review the following plan we discussed and let me know if I can assist you in the future.   These are the goals we discussed: Goals    . DIET - INCREASE WATER INTAKE     Stay hydrated    . Increase lean proteins     Low carb foods       This is a list of the screening recommended for you and due dates:  Health Maintenance  Topic Date Due  . Tetanus Vaccine  05/13/2020*  . Mammogram  06/06/2019  . Colon Cancer Screening  06/20/2026  . Flu Shot  Completed  . DEXA scan (bone density measurement)  Completed  .  Hepatitis C: One time screening is recommended by Center for Disease Control  (CDC) for  adults born from [birth year] through 1965.   Completed  . Pneumonia vaccines  Completed  *Topic was postponed. The date shown is not the original due date.

## 2019-05-14 NOTE — Progress Notes (Addendum)
Subjective:   Susan Miller is a 74 y.o. female who presents for Medicare Annual (Subsequent) preventive examination.  Review of Systems:  No ROS.  Medicare Wellness Virtual Visit.  Visual/audio telehealth visit, UTA vital signs.  Wt/Ht provided.   See social history for additional risk factors.   Cardiac Risk Factors include: advanced age (>74men, >59 women);hypertension     Objective:     Vitals: Ht 5\' 7"  (1.702 m)   Wt 215 lb (97.5 kg)   BMI 33.67 kg/m   Body mass index is 33.67 kg/m.  Advanced Directives 05/14/2019 01/08/2019 11/09/2018 12/06/2017 12/07/2016 06/20/2016  Does Patient Have a Medical Advance Directive? No No No No No No  Does patient want to make changes to medical advance directive? - No - Patient declined - No - Patient declined - -  Would patient like information on creating a medical advance directive? No - Patient declined - - - Yes (MAU/Ambulatory/Procedural Areas - Information given) -    Tobacco Social History   Tobacco Use  Smoking Status Never Smoker  Smokeless Tobacco Never Used     Counseling given: Not Answered   Clinical Intake:           Diabetes: No  How often do you need to have someone help you when you read instructions, pamphlets, or other written materials from your doctor or pharmacy?: 1 - Never  Interpreter Needed?: No     Past Medical History:  Diagnosis Date  . Allergy   . Depression   . History of chicken pox   . Hx: UTI (urinary tract infection)   . Hypertension    Past Surgical History:  Procedure Laterality Date  . COLONOSCOPY WITH PROPOFOL N/A 06/20/2016   Procedure: COLONOSCOPY WITH PROPOFOL;  Surgeon: 06/22/2016, MD;  Location: [facility] ENDOSCOPY;  Service: Endoscopy;  Laterality: N/A;  . ESOPHAGOGASTRODUODENOSCOPY (EGD) WITH PROPOFOL N/A 06/20/2016   Procedure: ESOPHAGOGASTRODUODENOSCOPY (EGD) WITH PROPOFOL;  Surgeon: 06/22/2016, MD;  Location: [facility] ENDOSCOPY;  Service: Endoscopy;  Laterality: N/A;  .  TONSILLECTOMY     as a child  . TUMOR REMOVAL     Family History  Problem Relation Age of Onset  . Hypertension Mother   . Cancer Sister        colon cancer  . Hypertension Sister   . Diabetes Sister   . Hypertension Brother   . Hypertension Maternal Aunt   . Breast cancer Maternal Aunt 60  . Hypertension Maternal Uncle   . Stroke Maternal Grandmother   . Hypertension Maternal Grandmother    Social History   Socioeconomic History  . Marital status: Divorced    Spouse name: Not on file  . Number of children: Not on file  . Years of education: Not on file  . Highest education level: Not on file  Occupational History  . Not on file  Tobacco Use  . Smoking status: Never Smoker  . Smokeless tobacco: Never Used  Substance and Sexual Activity  . Alcohol use: No    Alcohol/week: 0.0 standard drinks  . Drug use: No  . Sexual activity: Not Currently  Other Topics Concern  . Not on file  Social History Narrative  . Not on file   Social Determinants of Health   Financial Resource Strain: Low Risk   . Difficulty of Paying Living Expenses: Not hard at all  Food Insecurity: No Food Insecurity  . Worried About Midge Minium in the Last Year: Never true  .  Ran Out of Food in the Last Year: Never true  Transportation Needs: No Transportation Needs  . Lack of Transportation (Medical): No  . Lack of Transportation (Non-Medical): No  Physical Activity: Insufficiently Active  . Days of Exercise per Week: 1 day  . Minutes of Exercise per Session: 20 min  Stress: No Stress Concern Present  . Feeling of Stress : Not at all  Social Connections: Unknown  . Frequency of Communication with Friends and Family: More than three times a week  . Frequency of Social Gatherings with Friends and Family: More than three times a week  . Attends Religious Services: Not on file  . Active Member of Clubs or Organizations: Not on file  . Attends Banker Meetings: Not on file  .  Marital Status: Not on file    Outpatient Encounter Medications as of 05/14/2019  Medication Sig  . conjugated estrogens (PREMARIN) vaginal cream Place 1 Applicatorful vaginally daily. Use pea sized amount M-W-Fr before bedtime  . esomeprazole (NEXIUM) 40 MG capsule TAKE 1 CAPSULE DAILY BEFORE BREAKFAST  . lisinopril-hydrochlorothiazide (ZESTORETIC) 10-12.5 MG tablet TAKE 1 TABLET EVERY DAY  . sertraline (ZOLOFT) 25 MG tablet TAKE 1 TABLET EVERY DAY   No facility-administered encounter medications on file as of 05/14/2019.    Activities of Daily Living In your present state of health, do you have any difficulty performing the following activities: 05/14/2019 04/08/2019  Hearing? N N  Vision? N N  Difficulty concentrating or making decisions? N N  Walking or climbing stairs? N N  Dressing or bathing? N N  Doing errands, shopping? N N  Preparing Food and eating ? N -  Using the Toilet? N -  In the past six months, have you accidently leaked urine? N -  Do you have problems with loss of bowel control? N -  Managing your Medications? N -  Managing your Finances? N -  Housekeeping or managing your Housekeeping? N -  Some recent data might be hidden    Patient Care Team: Dale [place], MD as PCP - General (Internal Medicine) Debbe Odea, MD as PCP - Cardiology (Cardiology)    Assessment:   This is a routine wellness examination for Susan Miller.  Nurse connected with patient 05/14/19 at  9:00 AM EST by a telephone enabled telemedicine application and verified that I am speaking with the correct person using two identifiers. Patient stated full name and DOB. Patient gave permission to continue with virtual visit. Patient's location was at home and Nurse's location was at [facility] office.   Patient is alert and oriented x3. Patient denies difficulty focusing or concentrating. Patient likes to read for brain stimulation.   Health Maintenance Due: -Tdap vaccine- discussed; to be  completed with doctor in visit or local pharmacy.   See completed HM at the end of note.   Eye: Visual acuity not assessed. Virtual visit. Followed by their ophthalmologist.  Dental: Visits every 12 months.    Hearing: Demonstrates normal hearing during visit.  Safety:  Patient feels safe at home- yes; sons lives with her.  Patient does have smoke detectors at home- yes Patient does wear sunscreen or protective clothing when in direct sunlight - yes Patient does wear seat belt when in a moving vehicle - yes Patient drives- yes Adequate lighting in walkways free from debris- yes Grab bars and handrails used as appropriate- yes Ambulates with an assistive device- no Cell phone on person when ambulating outside of the home- yes  Social:  Alcohol intake - no    Smoking history- never   Smokers in home? none Illicit drug use? none  Medication: Taking as directed and without issues.  Self managed - yes   Covid-19: Precautions and sickness symptoms discussed. Wears mask, social distancing, hand hygiene as appropriate.   Activities of Daily Living Patient denies needing assistance with: household chores, feeding themselves, getting from bed to chair, getting to the toilet, bathing/showering, dressing, managing money, or preparing meals.   Discussed the importance of a healthy diet, water intake and the benefits of aerobic exercise.   Physical activity-  Stationary bike, no routine.   Diet:  Regular Water: fair water intake Caffeine: 2 cups of coffee  Other Providers Patient Care Team: Dale [place], MD as PCP - General (Internal Medicine) Debbe Odea, MD as PCP - Cardiology (Cardiology)  Exercise Activities and Dietary recommendations Current Exercise Habits: Home exercise routine, Type of exercise: calisthenics, Time (Minutes): 10, Frequency (Times/Week): 1, Weekly Exercise (Minutes/Week): 10, Intensity: Mild  Goals    . DIET - INCREASE WATER INTAKE     Stay  hydrated    . Increase lean proteins     Low carb foods       Fall Risk Fall Risk  05/14/2019 12/06/2017 12/07/2016 04/21/2016 01/11/2016  Falls in the past year? 1 No No No Yes  Number falls in past yr: 0 - - - 1  Injury with Fall? 0 - - - No  Comment She tripped over a bag outside that she did not see. - - - -  Follow up Falls evaluation completed;Falls prevention discussed - - - -   Timed Get Up and Go performed: no, virtual visit.  Depression Screen PHQ 2/9 Scores 05/14/2019 04/08/2019 12/24/2018 12/06/2017  PHQ - 2 Score 3 3 5 2   PHQ- 9 Score 6 13 12 5      Cognitive Function MMSE - Mini Mental State Exam 12/06/2017 12/07/2016  Orientation to time 5 5  Orientation to Place 5 5  Registration 3 3  Attention/ Calculation 5 5  Recall 3 3  Language- name 2 objects 2 2  Language- repeat 1 1  Language- follow 3 step command 3 3  Language- read & follow direction 1 1  Write a sentence 1 1  Copy design 1 1  Total score 30 30     6CIT Screen 05/14/2019  What Year? 0 points  What month? 0 points  What time? 0 points  Count back from 20 0 points  Months in reverse 0 points  Repeat phrase 0 points  Total Score 0    Immunization History  Administered Date(s) Administered  . Fluad Quad(high Dose 65+) 01/31/2019  . Influenza, High Dose Seasonal PF 01/11/2016, 04/27/2017, 01/18/2018  . Influenza,inj,Quad PF,6+ Mos 02/24/2013, 02/27/2014, 02/22/2015  . Pneumococcal Conjugate-13 11/06/2016  . Pneumococcal Polysaccharide-23 12/06/2017   Screening Tests Health Maintenance  Topic Date Due  . TETANUS/TDAP  05/13/2020 (Originally 03/06/1965)  . MAMMOGRAM  06/06/2019  . COLONOSCOPY  06/20/2026  . INFLUENZA VACCINE  Completed  . DEXA SCAN  Completed  . Hepatitis C Screening  Completed  . PNA vac Low Risk Adult  Completed      Plan:   Keep all routine maintenance appointments.   Next scheduled lab @ 9:45  Cpe 08/21/19 @ 1030  Medicare Attestation I have personally reviewed: The  patient's medical and social history Their use of alcohol, tobacco or illicit drugs Their current medications and supplements The patient's functional ability including  ADLs,fall risks, home safety risks, cognitive, and hearing and visual impairment Diet and physical activities Evidence for depression   I have reviewed and discussed with patient certain preventive protocols, quality metrics, and best practice recommendations.     Varney Biles, LPN  2/0/6015   Reviewed above information.  Agree with assessment and plan.    Dr Nicki Reaper

## 2019-05-15 ENCOUNTER — Other Ambulatory Visit: Payer: Self-pay

## 2019-05-20 ENCOUNTER — Other Ambulatory Visit: Payer: Medicare HMO

## 2019-06-12 ENCOUNTER — Emergency Department: Payer: Medicare HMO

## 2019-06-12 ENCOUNTER — Emergency Department
Admission: EM | Admit: 2019-06-12 | Discharge: 2019-06-12 | Disposition: A | Discharge status: Home / Self Care | Payer: Medicare HMO | Attending: Emergency Medicine | Admitting: Emergency Medicine

## 2019-06-12 ENCOUNTER — Other Ambulatory Visit: Payer: Self-pay

## 2019-06-12 DIAGNOSIS — M546 Pain in thoracic spine: Secondary | ICD-10-CM | POA: Diagnosis not present

## 2019-06-12 DIAGNOSIS — Z5321 Procedure and treatment not carried out due to patient leaving prior to being seen by health care provider: Secondary | ICD-10-CM | POA: Diagnosis not present

## 2019-06-12 DIAGNOSIS — R918 Other nonspecific abnormal finding of lung field: Secondary | ICD-10-CM | POA: Diagnosis not present

## 2019-06-12 LAB — CBC WITH DIFFERENTIAL/PLATELET
Abs Immature Granulocytes: 0.02 10*3/uL (ref 0.00–0.07)
Basophils Absolute: 0 10*3/uL (ref 0.0–0.1)
Basophils Relative: 0 %
Eosinophils Absolute: 0.2 10*3/uL (ref 0.0–0.5)
Eosinophils Relative: 2 %
HCT: 37.2 % (ref 36.0–46.0)
Hemoglobin: 12.5 g/dL (ref 12.0–15.0)
Immature Granulocytes: 0 %
Lymphocytes Relative: 46 %
Lymphs Abs: 4.1 10*3/uL — ABNORMAL HIGH (ref 0.7–4.0)
MCH: 30.1 pg (ref 26.0–34.0)
MCHC: 33.6 g/dL (ref 30.0–36.0)
MCV: 89.6 fL (ref 80.0–100.0)
Monocytes Absolute: 0.7 10*3/uL (ref 0.1–1.0)
Monocytes Relative: 8 %
Neutro Abs: 4 10*3/uL (ref 1.7–7.7)
Neutrophils Relative %: 44 %
Platelets: 300 10*3/uL (ref 150–400)
RBC: 4.15 MIL/uL (ref 3.87–5.11)
RDW: 13.2 % (ref 11.5–15.5)
WBC: 9 10*3/uL (ref 4.0–10.5)
nRBC: 0 % (ref 0.0–0.2)

## 2019-06-12 LAB — COMPREHENSIVE METABOLIC PANEL
ALT: 15 U/L (ref 0–44)
AST: 21 U/L (ref 15–41)
Albumin: 4 g/dL (ref 3.5–5.0)
Alkaline Phosphatase: 75 U/L (ref 38–126)
Anion gap: 9 (ref 5–15)
BUN: 19 mg/dL (ref 8–23)
CO2: 24 mmol/L (ref 22–32)
Calcium: 9.3 mg/dL (ref 8.9–10.3)
Chloride: 105 mmol/L (ref 98–111)
Creatinine, Ser: 1.1 mg/dL — ABNORMAL HIGH (ref 0.44–1.00)
GFR calc Af Amer: 58 mL/min — ABNORMAL LOW (ref >60)
GFR calc non Af Amer: 50 mL/min — ABNORMAL LOW (ref >60)
Glucose, Bld: 132 mg/dL — ABNORMAL HIGH (ref 70–99)
Potassium: 3.9 mmol/L (ref 3.5–5.1)
Sodium: 138 mmol/L (ref 135–145)
Total Bilirubin: 1 mg/dL (ref 0.3–1.2)
Total Protein: 7.3 g/dL (ref 6.5–8.1)

## 2019-06-12 LAB — TROPONIN I (HIGH SENSITIVITY): Troponin I (High Sensitivity): 3 ng/L (ref <18)

## 2019-06-12 NOTE — ED Triage Notes (Signed)
Patient c/o upper left back pain onset a few hours after cleaning her car.

## 2019-06-13 ENCOUNTER — Telehealth: Payer: Self-pay | Admitting: Emergency Medicine

## 2019-06-13 NOTE — Telephone Encounter (Signed)
Received message.  Pt apparently left urgent visit without being seen.  Please call and confirm pt doing ok

## 2019-06-13 NOTE — Telephone Encounter (Signed)
Called patient due to lwot to inquire about condition and follow up plans. Patient says she still has some pain, and it increases with certain movements.  Thinks she pulled a muscle while cleaning her car.  I encouraged her to follow up and explained that we have lab and xray results available to her her pcp.

## 2019-06-13 NOTE — Telephone Encounter (Signed)
Called patient. Confirmed doing ok. Feeling some better. She thinks that she just got scared and went to UC. Did not feel it was necessary to keep waiting. Stated EKG was normal. She will let us know if she needs anything.

## 2019-07-07 ENCOUNTER — Other Ambulatory Visit: Payer: Self-pay | Admitting: Internal Medicine

## 2019-07-08 ENCOUNTER — Other Ambulatory Visit: Payer: Self-pay | Admitting: Internal Medicine

## 2019-07-08 MED ORDER — ESOMEPRAZOLE MAGNESIUM 40 MG PO CPDR: 40.0000 mg | DELAYED_RELEASE_CAPSULE | Freq: Every day | ORAL | 0 refills | Status: DC

## 2019-07-23 IMAGING — MG MM DIGITAL SCREENING BILAT W/ TOMO W/ CAD
8 of 12 series · 8 of 28 positions shown · non-contrast
Comparison: Previous exam(s).

CLINICAL DATA: Screening.

EXAM:
2D DIGITAL SCREENING BILATERAL MAMMOGRAM WITH 3D TOMO WITH CAD

[L CC synth-2D]
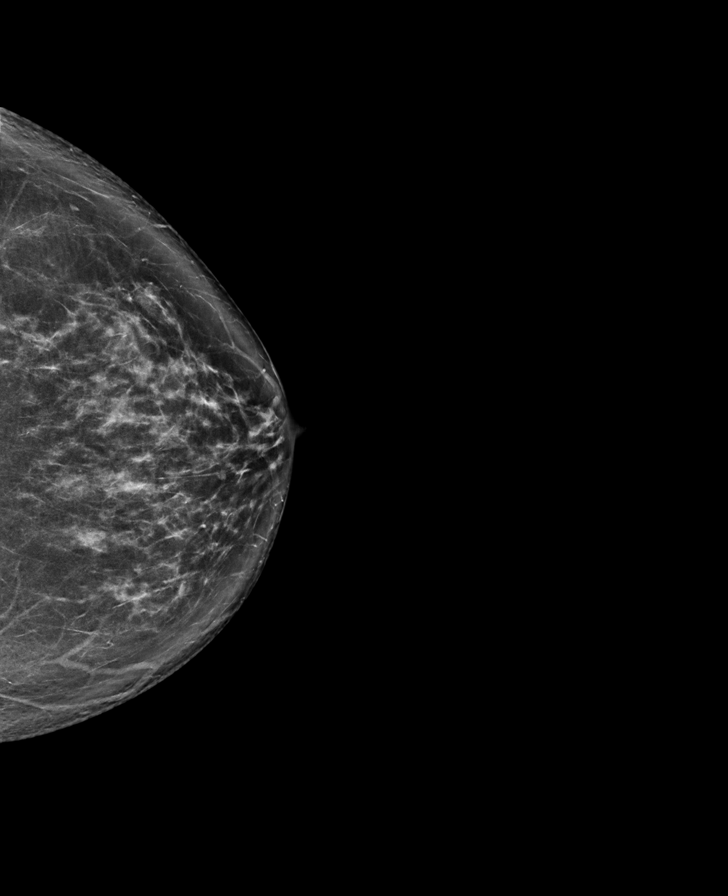

[R CC synth-2D]
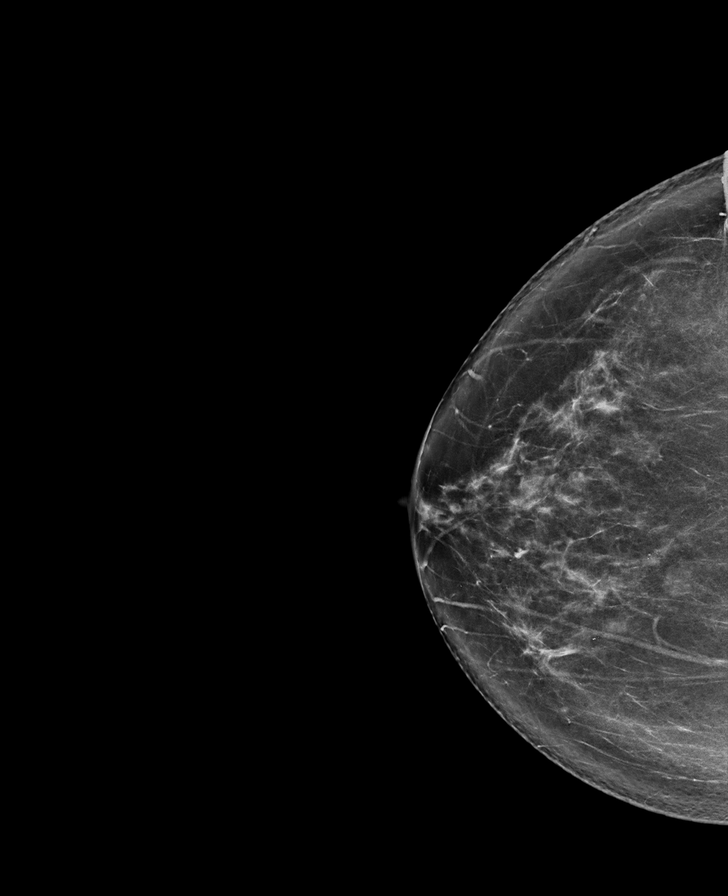

[L CC]
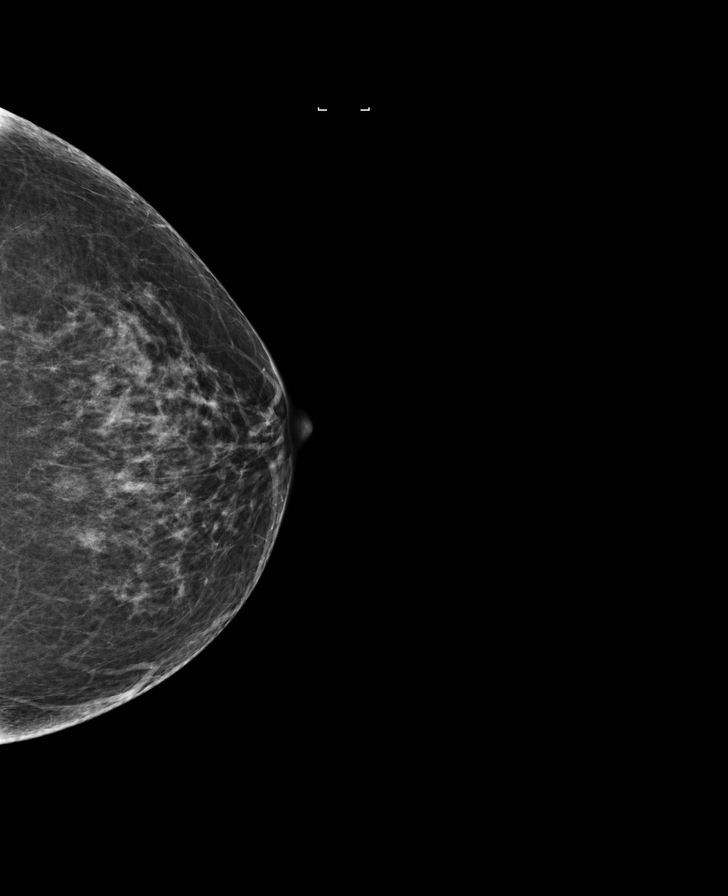

[R MLO]
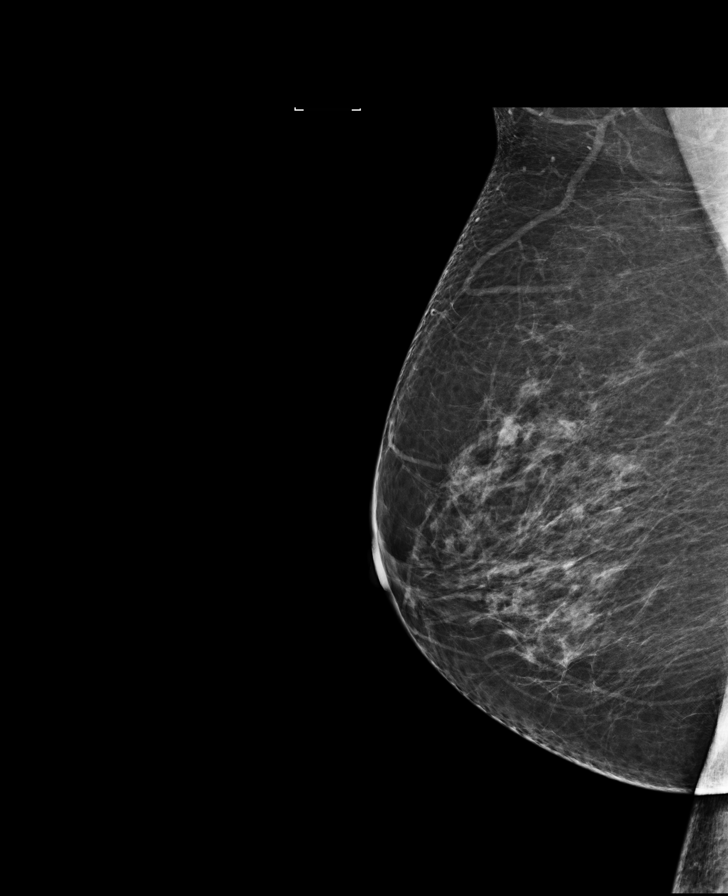

[L MLO]
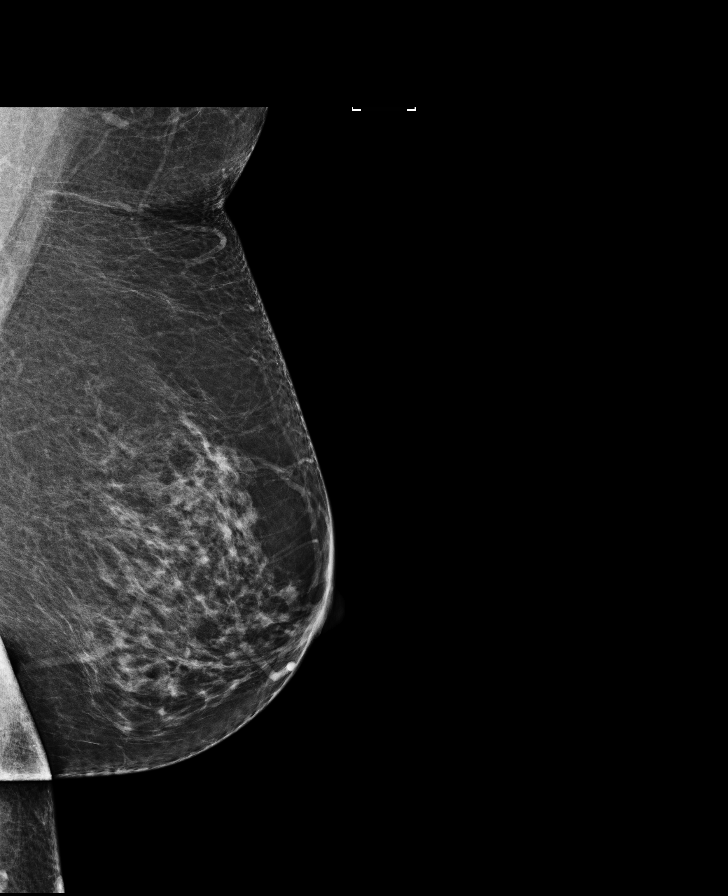

[L MLO synth-2D]
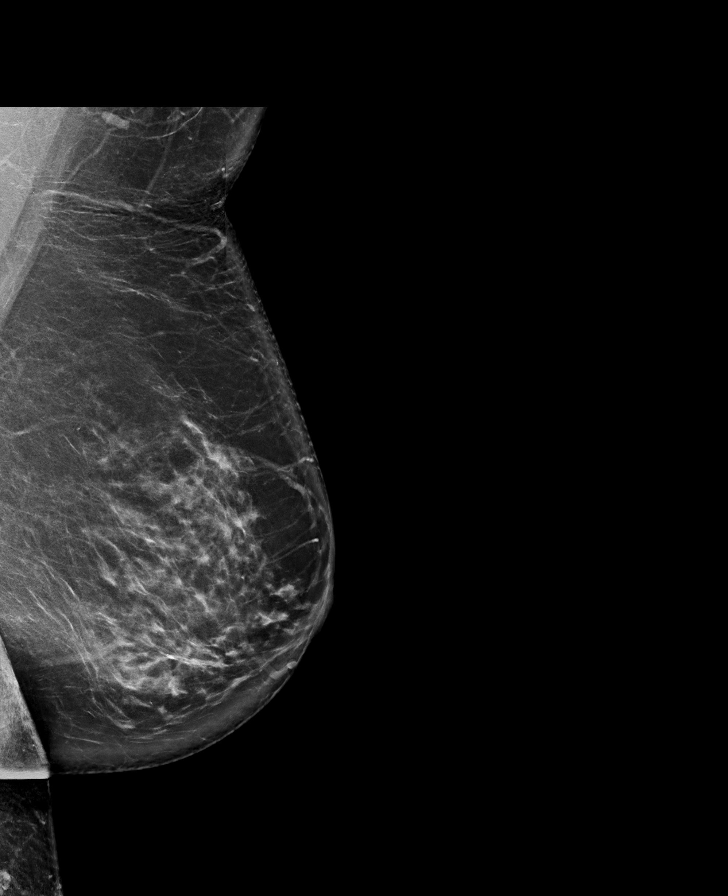

[R CC]
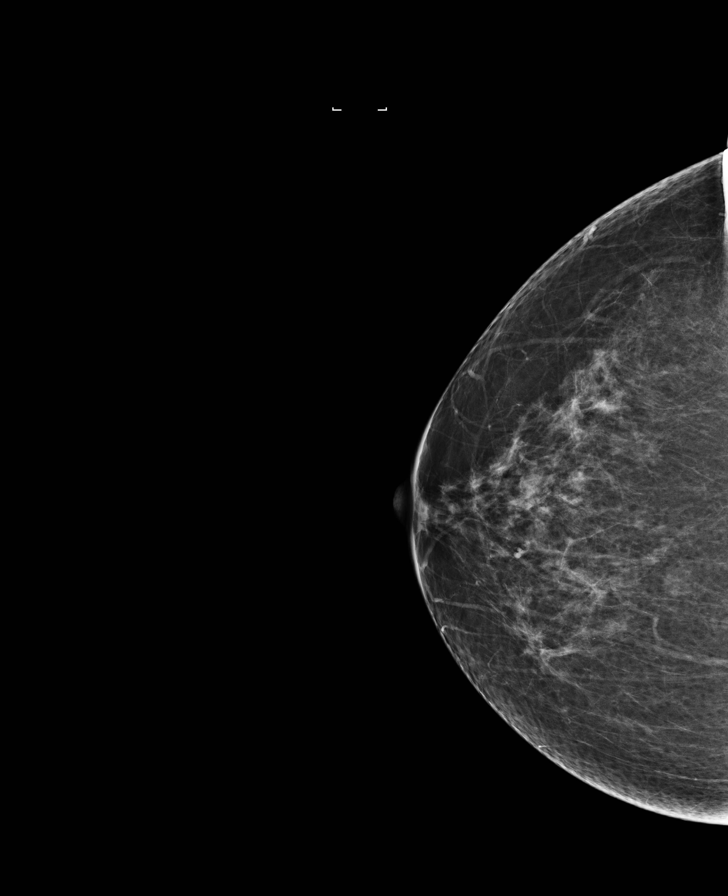

[R MLO synth-2D]
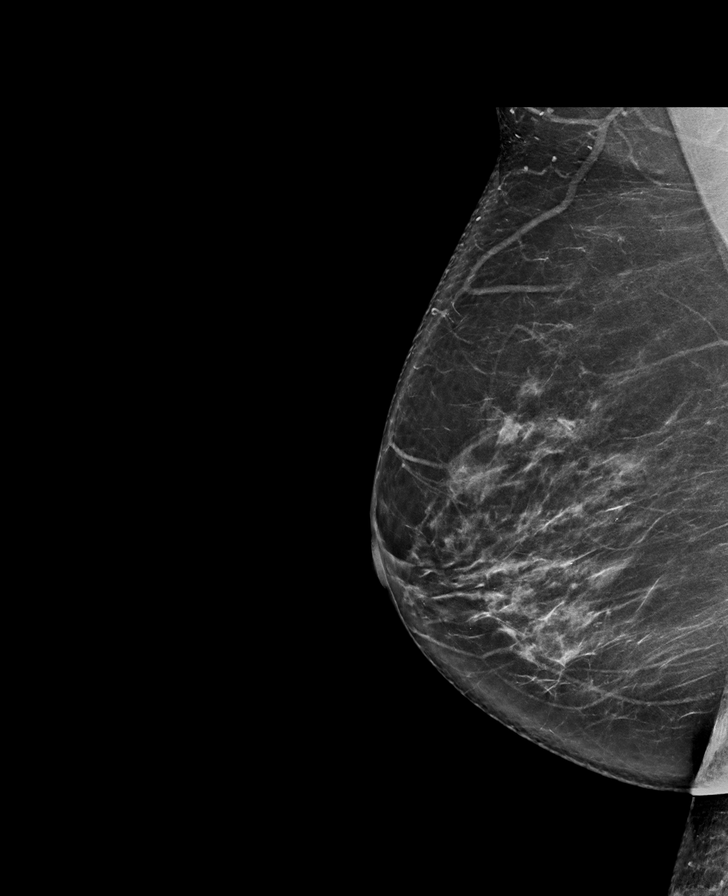

[8 of 28 positions shown; findings below may reference images not displayed]

ACR Breast Density Category b: There are scattered areas of
fibroglandular density.
FINDINGS: There are no findings suspicious for malignancy. Images were
processed with CAD.
IMPRESSION: No mammographic evidence of malignancy. A result letter of this
screening mammogram will be mailed directly to the patient.

RECOMMENDATION:
Screening mammogram in one year. (Code:[id number])

BI-RADS CATEGORY  1: Negative.

## 2019-08-15 ENCOUNTER — Encounter: Payer: Self-pay | Admitting: Internal Medicine

## 2019-08-21 ENCOUNTER — Encounter: Payer: Self-pay | Admitting: Internal Medicine

## 2019-08-21 ENCOUNTER — Other Ambulatory Visit: Payer: Self-pay

## 2019-08-21 ENCOUNTER — Ambulatory Visit (INDEPENDENT_AMBULATORY_CARE_PROVIDER_SITE_OTHER): Payer: Medicare HMO | Admitting: Internal Medicine

## 2019-08-21 VITALS — BP 118/70 | HR 76 | Temp 96.5°F | Ht 67.5 in | Wt 219.2 lb

## 2019-08-21 DIAGNOSIS — R05 Cough: Secondary | ICD-10-CM | POA: Diagnosis not present

## 2019-08-21 DIAGNOSIS — I10 Essential (primary) hypertension: Secondary | ICD-10-CM

## 2019-08-21 DIAGNOSIS — Z1231 Encounter for screening mammogram for malignant neoplasm of breast: Secondary | ICD-10-CM | POA: Diagnosis not present

## 2019-08-21 DIAGNOSIS — F32A Depression, unspecified: Secondary | ICD-10-CM

## 2019-08-21 DIAGNOSIS — K219 Gastro-esophageal reflux disease without esophagitis: Secondary | ICD-10-CM | POA: Diagnosis not present

## 2019-08-21 DIAGNOSIS — Z0000 Encounter for general adult medical examination without abnormal findings: Secondary | ICD-10-CM

## 2019-08-21 DIAGNOSIS — R739 Hyperglycemia, unspecified: Secondary | ICD-10-CM | POA: Diagnosis not present

## 2019-08-21 DIAGNOSIS — R059 Cough, unspecified: Secondary | ICD-10-CM

## 2019-08-21 DIAGNOSIS — F320 Major depressive disorder, single episode, mild: Secondary | ICD-10-CM | POA: Diagnosis not present

## 2019-08-21 LAB — CBC WITH DIFFERENTIAL/PLATELET
Basophils Absolute: 0 10*3/uL (ref 0.0–0.1)
Basophils Relative: 0.3 % (ref 0.0–3.0)
Eosinophils Absolute: 0.2 10*3/uL (ref 0.0–0.7)
Eosinophils Relative: 2.7 % (ref 0.0–5.0)
HCT: 36.1 % (ref 36.0–46.0)
Hemoglobin: 12.2 g/dL (ref 12.0–15.0)
Lymphocytes Relative: 37.4 % (ref 12.0–46.0)
Lymphs Abs: 2.5 10*3/uL (ref 0.7–4.0)
MCHC: 33.8 g/dL (ref 30.0–36.0)
MCV: 89.7 fl (ref 78.0–100.0)
Monocytes Absolute: 0.5 10*3/uL (ref 0.1–1.0)
Monocytes Relative: 7.8 % (ref 3.0–12.0)
Neutro Abs: 3.4 10*3/uL (ref 1.4–7.7)
Neutrophils Relative %: 51.8 % (ref 43.0–77.0)
Platelets: 280 10*3/uL (ref 150.0–400.0)
RBC: 4.03 Mil/uL (ref 3.87–5.11)
RDW: 13.7 % (ref 11.5–15.5)
WBC: 6.6 10*3/uL (ref 4.0–10.5)

## 2019-08-21 LAB — HEPATIC FUNCTION PANEL
ALT: 13 U/L (ref 0–35)
AST: 17 U/L (ref 0–37)
Albumin: 4.3 g/dL (ref 3.5–5.2)
Alkaline Phosphatase: 68 U/L (ref 39–117)
Bilirubin, Direct: 0.1 mg/dL (ref 0.0–0.3)
Total Bilirubin: 0.5 mg/dL (ref 0.2–1.2)
Total Protein: 7.1 g/dL (ref 6.0–8.3)

## 2019-08-21 LAB — BASIC METABOLIC PANEL
BUN: 17 mg/dL (ref 6–23)
CO2: 30 mEq/L (ref 19–32)
Calcium: 9.7 mg/dL (ref 8.4–10.5)
Chloride: 104 mEq/L (ref 96–112)
Creatinine, Ser: 0.99 mg/dL (ref 0.40–1.20)
GFR: 54.91 mL/min — ABNORMAL LOW (ref >60.00)
Glucose, Bld: 88 mg/dL (ref 70–99)
Potassium: 4.1 mEq/L (ref 3.5–5.1)
Sodium: 141 mEq/L (ref 135–145)

## 2019-08-21 LAB — LIPID PANEL
Cholesterol: 203 mg/dL — ABNORMAL HIGH (ref 0–200)
HDL: 46.5 mg/dL (ref >39.00)
LDL Cholesterol: 133 mg/dL — ABNORMAL HIGH (ref 0–99)
NonHDL: 156.1
Total CHOL/HDL Ratio: 4
Triglycerides: 115 mg/dL (ref 0.0–149.0)
VLDL: 23 mg/dL (ref 0.0–40.0)

## 2019-08-21 LAB — HEMOGLOBIN A1C: Hgb A1c MFr Bld: 6 % (ref 4.6–6.5)

## 2019-08-21 LAB — TSH: TSH: 2.25 u[IU]/mL (ref 0.35–4.50)

## 2019-08-21 NOTE — Assessment & Plan Note (Addendum)
Physical today 08/21/19.  Mammogram 06/05/18 - Birads I.  Schedule mammogram.   Colonoscopy 06/2016 - internal hemorrhoids.  Recommended f/u in 10 years.

## 2019-08-21 NOTE — Patient Instructions (Addendum)
Add pepcid 20mg  - take one tablet 30 minutes before your evening meal

## 2019-08-21 NOTE — Progress Notes (Signed)
[name], female   DOB: [birth year], 74 y.o.   MRN: [id number]   Subjective:    [name], female    DOB: 06-Jul-1945, 74 y.o.   MRN: [id number]  HPI This visit occurred during the SARS-CoV-2 public health emergency.  Safety protocols were in place, including screening questions prior to the visit, additional usage of staff PPE, and extensive cleaning of exam room while observing appropriate contact time as indicated for disinfecting solutions.  Patient here for her physical exam.  She reports she is doing relatively well.  Has seen Dr Erlene Quan for recurrent UTI.  Recommend topical estrogen cream.  With constipation.  Some lower pelvic discomfort.  Question if related.  Discussed taking cirtucel.  Some congestion in am - dry cough.  Occasional acid reflux.  Discussed taking nexium.  Went to ER for back pain.  Not seen.  States she cleaned her car - and felt she aggravated her back.  Not an issue now.  No chest pain.  Breathing overall stable.  Increased stress. On zoloft.  Feels stable.     Past Medical History:  Diagnosis Date  . Allergy   . Depression   . History of chicken pox   . Hx: UTI (urinary tract infection)   . Hypertension    Past Surgical History:  Procedure Laterality Date  . COLONOSCOPY WITH PROPOFOL N/A 06/20/2016   Procedure: COLONOSCOPY WITH PROPOFOL;  Surgeon: Lucilla Lame, MD;  Location: [facility] END[facility]COPY;  Service: Endoscopy;  Laterality: N/A;  . ESOPHAGOGASTRODUODENOSCOPY (EGD) WITH PROPOFOL N/A 06/20/2016   Procedure: ESOPHAGOGASTRODUODENOSCOPY (EGD) WITH PROPOFOL;  Surgeon: Lucilla Lame, MD;  Location: [facility] ENDOSCOPY;  Service: Endoscopy;  Laterality: N/A;  . TONSILLECTOMY     as a child  . TUMOR REMOVAL     Family History  Problem Relation Age of Onset  . Hypertension Mother   . Cancer Sister        colon cancer  . Hypertension Sister   . Diabetes Sister   . Hypertension Brother   . Hypertension Maternal Aunt   . Breast cancer  Maternal Aunt 60  . Hypertension Maternal Uncle   . Stroke Maternal Grandmother   . Hypertension Maternal Grandmother    Social History   Socioeconomic History  . Marital status: Divorced    Spouse name: Not on file  . Number of children: Not on file  . Years of education: Not on file  . Highest education level: Not on file  Occupational History  . Not on file  Tobacco Use  . Smoking status: Never Smoker  . Smokeless tobacco: Never Used  Substance and Sexual Activity  . Alcohol use: No    Alcohol/week: 0.0 standard drinks  . Drug use: No  . Sexual activity: Not Currently  Other Topics Concern  . Not on file  Social History Narrative  . Not on file   Social Determinants of Health   Financial Resource Strain: Low Risk   . Difficulty of Paying Living Expenses: Not hard at all  Food Insecurity: No Food Insecurity  . Worried About Charity fundraiser in the Last Year: Never true  . Ran Out of Food in the Last Year: Never true  Transportation Needs: No Transportation Needs  . Lack of Transportation (Medical): No  . Lack of Transportation (Non-Medical): No  Physical Activity: Insufficiently Active  . Days of Exercise per Week: 1 day  . Minutes of Exercise per Session: 20 min  Stress: No  Stress Concern Present  . Feeling of Stress : Not at all  Social Connections: Unknown  . Frequency of Communication with Friends and Family: More than three times a week  . Frequency of Social Gatherings with Friends and Family: More than three times a week  . Attends Religious Services: Not on file  . Active Member of Clubs or Organizations: Not on file  . Attends Archivist Meetings: Not on file  . Marital Status: Not on file    Outpatient Encounter Medications as of 08/21/2019  Medication Sig  . conjugated estrogens (PREMARIN) vaginal cream Place 1 Applicatorful vaginally daily. Use pea sized amount M-W-Fr before bedtime  . esomeprazole (NEXIUM) 40 MG capsule Take 1 capsule  (40 mg total) by mouth daily at 12 noon.  Marland Kitchen lisinopril-hydrochlorothiazide (ZESTORETIC) 10-12.5 MG tablet TAKE 1 TABLET EVERY DAY  . sertraline (ZOLOFT) 25 MG tablet TAKE 1 TABLET EVERY DAY   No facility-administered encounter medications on file as of 08/21/2019.    Review of Systems  Constitutional: Negative for appetite change and unexpected weight change.  HENT: Negative for sinus pressure.        Some am congestion as outlined.    Eyes: Negative for pain and visual disturbance.  Respiratory: Negative for cough, chest tightness and shortness of breath.   Cardiovascular: Negative for chest pain, palpitations and leg swelling.  Gastrointestinal: Negative for abdominal pain, diarrhea, nausea and vomiting.       Some issues with constipation as outlined.  Occasional acid reflux.    Genitourinary: Negative for difficulty urinating and dysuria.  Musculoskeletal: Negative for joint swelling and myalgias.  Skin: Negative for color change and rash.  Neurological: Negative for dizziness, light-headedness and headaches.  Hematological: Negative for adenopathy. Does not bruise/bleed easily.  Psychiatric/Behavioral: Negative for agitation and dysphoric mood.       Increased stress as outlined.         Objective:    Physical Exam Vitals reviewed.  Constitutional:      General: She is not in acute distress.    Appearance: Normal appearance. She is well-developed.  HENT:     Head: Normocephalic and atraumatic.     Right Ear: External ear normal.     Left Ear: External ear normal.  Eyes:     General: No scleral icterus.       Right eye: No discharge.        Left eye: No discharge.     Conjunctiva/sclera: Conjunctivae normal.  Neck:     Thyroid: No thyromegaly.  Cardiovascular:     Rate and Rhythm: Normal rate and regular rhythm.  Pulmonary:     Effort: No tachypnea, accessory muscle usage or respiratory distress.     Breath sounds: Normal breath sounds. No decreased breath sounds or  wheezing.  Chest:     Breasts:        Right: No inverted nipple, mass, nipple discharge or tenderness (no axillary adenopathy).        Left: No inverted nipple, mass, nipple discharge or tenderness (no axilarry adenopathy).  Abdominal:     General: Bowel sounds are normal.     Palpations: Abdomen is soft.     Tenderness: There is no abdominal tenderness.  Musculoskeletal:        General: No swelling or tenderness.     Cervical back: Neck supple. No tenderness.  Lymphadenopathy:     Cervical: No cervical adenopathy.  Skin:    Findings: No erythema or rash.  Neurological:     Mental Status: She is alert and oriented to person, place, and time.  Psychiatric:        Mood and Affect: Mood normal.        Behavior: Behavior normal.     BP 118/70   Pulse 76   Temp (!) 96.5 F (35.8 C) (Temporal)   Ht 5' 7.5" (1.715 m)   Wt 219 lb 3.2 oz (99.4 kg)   SpO2 97%   BMI 33.82 kg/m  Wt Readings from Last 3 Encounters:  08/21/19 219 lb 3.2 oz (99.4 kg)  05/14/19 215 lb (97.5 kg)  04/08/19 214 lb (97.1 kg)     Lab Results  Component Value Date   WBC 6.6 08/21/2019   HGB 12.2 08/21/2019   HCT 36.1 08/21/2019   PLT 280.0 08/21/2019   GLUCOSE 88 08/21/2019   CHOL 203 (H) 08/21/2019   TRIG 115.0 08/21/2019   HDL 46.50 08/21/2019   LDLCALC 133 (H) 08/21/2019   ALT 13 08/21/2019   AST 17 08/21/2019   NA 141 08/21/2019   K 4.1 08/21/2019   CL 104 08/21/2019   CREATININE 0.99 08/21/2019   BUN 17 08/21/2019   CO2 30 08/21/2019   TSH 2.25 08/21/2019   HGBA1C 6.0 08/21/2019    DG Knee 1-2 Views Left  Result Date: 12/24/2018 CLINICAL DATA:  Persistent LEFT knee pain EXAM: LEFT KNEE - 1-2 VIEW COMPARISON:  None. FINDINGS: No evidence of fracture, dislocation, or joint effusion. No evidence of arthropathy or other focal bone abnormality. Soft tissues are unremarkable. IMPRESSION: Negative. Electronically Signed   By: Franki Cabot M.D.   On: 12/24/2018 16:06       Assessment &  Plan:   Problem List Items Addressed This Visit    Cough    Occasional dry cough reported. Treat acid reflux.  Nasal spray if needed.  Follow.        Essential hypertension, benign    Blood pressure under good control.  Continue same medication regimen - on lisinopril/hctz.  Follow pressures. Follow metabolic panel.        GERD (gastroesophageal reflux disease)    Nexium/pepcid as discussed.  Follow.        Health care maintenance    Physical today 08/21/19.  Mammogram 06/05/18 - Birads I.  Schedule mammogram.   Colonoscopy 06/2016 - internal hemorrhoids.  Recommended f/u in 10 years.        Hyperglycemia    Low carb diet and exercise.  Follow met b and a1c.       Mild depression ([place])    On zoloft.  Increased stress.  She feels overall stable.  Follow.         Other Visit Diagnoses    Routine general medical examination at a health care facility    -  Primary   Encounter for screening mammogram for malignant neoplasm of breast       Relevant Orders   MM 3D SCREEN BREAST BILATERAL       Einar Pheasant, MD

## 2019-08-28 ENCOUNTER — Telehealth: Payer: Self-pay | Admitting: Internal Medicine

## 2019-08-28 ENCOUNTER — Other Ambulatory Visit: Payer: Self-pay

## 2019-08-28 MED ORDER — ROSUVASTATIN CALCIUM 10 MG PO TABS: 10.0000 mg | ORAL_TABLET | Freq: Every day | ORAL | 0 refills | Status: DC

## 2019-08-28 NOTE — Telephone Encounter (Signed)
See result note.  

## 2019-08-28 NOTE — Telephone Encounter (Signed)
Pt called about lab results. °

## 2019-08-31 ENCOUNTER — Telehealth: Payer: Self-pay | Admitting: Internal Medicine

## 2019-08-31 ENCOUNTER — Encounter: Payer: Self-pay | Admitting: Internal Medicine

## 2019-08-31 NOTE — Assessment & Plan Note (Signed)
Nexium/pepcid as discussed.  Follow.

## 2019-08-31 NOTE — Telephone Encounter (Signed)
I have placed the order for her mammogram.  She is overdue.  Please schedule if pt agreeable.  Thanks.

## 2019-08-31 NOTE — Assessment & Plan Note (Signed)
Low carb diet and exercise.  Follow met b and a1c.  

## 2019-08-31 NOTE — Assessment & Plan Note (Signed)
Occasional dry cough reported. Treat acid reflux.  Nasal spray if needed.  Follow.

## 2019-08-31 NOTE — Assessment & Plan Note (Signed)
Blood pressure under good control.  Continue same medication regimen - on lisinopril/hctz.  Follow pressures. Follow metabolic panel.

## 2019-08-31 NOTE — Assessment & Plan Note (Signed)
On zoloft.  Increased stress.  She feels overall stable.  Follow.

## 2019-09-04 NOTE — Telephone Encounter (Signed)
I called patient & she stated that she would give [place] a call today to scheduled. Phone number was provided.

## 2019-10-02 ENCOUNTER — Ambulatory Visit
Admission: RE | Admit: 2019-10-02 | Discharge: 2019-10-02 | Disposition: A | Discharge status: Home / Self Care | Payer: Medicare HMO | Source: Ambulatory Visit | Attending: Internal Medicine | Admitting: Internal Medicine

## 2019-10-02 DIAGNOSIS — Z1231 Encounter for screening mammogram for malignant neoplasm of breast: Secondary | ICD-10-CM | POA: Insufficient documentation

## 2019-10-09 ENCOUNTER — Ambulatory Visit (INDEPENDENT_AMBULATORY_CARE_PROVIDER_SITE_OTHER): Payer: Medicare HMO | Admitting: Internal Medicine

## 2019-10-09 ENCOUNTER — Other Ambulatory Visit: Payer: Self-pay

## 2019-10-09 ENCOUNTER — Encounter: Payer: Self-pay | Admitting: Internal Medicine

## 2019-10-09 VITALS — BP 124/70 | HR 81 | Temp 97.0°F | Resp 16 | Ht 68.0 in | Wt 218.4 lb

## 2019-10-09 DIAGNOSIS — E7800 Pure hypercholesterolemia, unspecified: Secondary | ICD-10-CM

## 2019-10-09 DIAGNOSIS — F32A Depression, unspecified: Secondary | ICD-10-CM

## 2019-10-09 DIAGNOSIS — F320 Major depressive disorder, single episode, mild: Secondary | ICD-10-CM | POA: Diagnosis not present

## 2019-10-09 DIAGNOSIS — R739 Hyperglycemia, unspecified: Secondary | ICD-10-CM

## 2019-10-09 DIAGNOSIS — R0602 Shortness of breath: Secondary | ICD-10-CM | POA: Diagnosis not present

## 2019-10-09 DIAGNOSIS — I10 Essential (primary) hypertension: Secondary | ICD-10-CM

## 2019-10-09 DIAGNOSIS — R42 Dizziness and giddiness: Secondary | ICD-10-CM | POA: Diagnosis not present

## 2019-10-09 DIAGNOSIS — E785 Hyperlipidemia, unspecified: Secondary | ICD-10-CM | POA: Insufficient documentation

## 2019-10-09 LAB — HEPATIC FUNCTION PANEL
ALT: 12 U/L (ref 0–35)
AST: 15 U/L (ref 0–37)
Albumin: 4.4 g/dL (ref 3.5–5.2)
Alkaline Phosphatase: 62 U/L (ref 39–117)
Bilirubin, Direct: 0.1 mg/dL (ref 0.0–0.3)
Total Bilirubin: 0.5 mg/dL (ref 0.2–1.2)
Total Protein: 6.8 g/dL (ref 6.0–8.3)

## 2019-10-09 NOTE — Progress Notes (Signed)
[name], female   DOB: [birth year], 74 y.o.   MRN: [id number]   Subjective:    [name], female    DOB: [birth year], 73 y.o.   MRN: [id number]  HPI This visit occurred during the SARS-CoV-2 public health emergency.  Safety protocols were in place, including screening questions prior to the visit, additional usage of staff PPE, and extensive cleaning of exam room while observing appropriate contact time as indicated for disinfecting solutions.  Patient here for a scheduled follow up.  Here to follow up regarding her blood pressure, cholesterol and increased stress.  She reports she is doing relatively well.  Increased stress with family issues.  She is also anxious about her health - cholesterol, etc.  Has noticed some intermittent episodes where she will have to catch her breath.  No chest pain.  Some sob.  Occasionally a little light headed and more unsteady.  No significant dizziness.  No headache.  Has noticed these symptoms over the last month.  She is concerned about the possibility of heart disease.  Thinking about this more -  treating cholesterol, etc.  No abdominal pain or bowel change reported.  No increased cough or congestion noted.     Past Medical History:  Diagnosis Date  . Allergy   . Depression   . History of chicken pox   . Hx: UTI (urinary tract infection)   . Hypertension    Past Surgical History:  Procedure Laterality Date  . COLONOSCOPY WITH PROPOFOL N/A 06/20/2016   Procedure: COLONOSCOPY WITH PROPOFOL;  Surgeon: Lucilla Lame, MD;  Location: [facility] ENDOSCOPY;  Service: Endoscopy;  Laterality: N/A;  . ESOPHAGOGASTRODUODENOSCOPY (EGD) WITH PROPOFOL N/A 06/20/2016   Procedure: ESOPHAGOGASTRODUODENOSCOPY (EGD) WITH PROPOFOL;  Surgeon: Lucilla Lame, MD;  Location: [facility] ENDOSCOPY;  Service: Endoscopy;  Laterality: N/A;  . TONSILLECTOMY     as a child  . TUMOR REMOVAL     Family History  Problem Relation Age of Onset  . Hypertension Mother     . Cancer Sister        colon cancer  . Hypertension Sister   . Diabetes Sister   . Hypertension Brother   . Hypertension Maternal Aunt   . Breast cancer Maternal Aunt 60  . Hypertension Maternal Uncle   . Stroke Maternal Grandmother   . Hypertension Maternal Grandmother    Social History   Socioeconomic History  . Marital status: Divorced    Spouse name: Not on file  . Number of children: Not on file  . Years of education: Not on file  . Highest education level: Not on file  Occupational History  . Not on file  Tobacco Use  . Smoking status: Never Smoker  . Smokeless tobacco: Never Used  Substance and Sexual Activity  . Alcohol use: No    Alcohol/week: 0.0 standard drinks  . Drug use: No  . Sexual activity: Not Currently  Other Topics Concern  . Not on file  Social History Narrative  . Not on file   Social Determinants of Health   Financial Resource Strain: Low Risk   . Difficulty of Paying Living Expenses: Not hard at all  Food Insecurity: No Food Insecurity  . Worried About Charity fundraiser in the Last Year: Never true  . Ran Out of Food in the Last Year: Never true  Transportation Needs: No Transportation Needs  . Lack of Transportation (Medical): No  . Lack of Transportation (Non-Medical): No  Physical  Activity: Insufficiently Active  . Days of Exercise per Week: 1 day  . Minutes of Exercise per Session: 20 min  Stress: No Stress Concern Present  . Feeling of Stress : Not at all  Social Connections: Unknown  . Frequency of Communication with Friends and Family: More than three times a week  . Frequency of Social Gatherings with Friends and Family: More than three times a week  . Attends Religious Services: Not on file  . Active Member of Clubs or Organizations: Not on file  . Attends Archivist Meetings: Not on file  . Marital Status: Not on file    Outpatient Encounter Medications as of 10/09/2019  Medication Sig  . conjugated estrogens  (PREMARIN) vaginal cream Place 1 Applicatorful vaginally daily. Use pea sized amount M-W-Fr before bedtime  . esomeprazole (NEXIUM) 40 MG capsule Take 1 capsule (40 mg total) by mouth daily at 12 noon.  Marland Kitchen lisinopril-hydrochlorothiazide (ZESTORETIC) 10-12.5 MG tablet TAKE 1 TABLET EVERY DAY  . rosuvastatin (CRESTOR) 10 MG tablet Take 1 tablet (10 mg total) by mouth daily.  . sertraline (ZOLOFT) 25 MG tablet TAKE 1 TABLET EVERY DAY   No facility-administered encounter medications on file as of 10/09/2019.    Review of Systems  Constitutional: Negative for appetite change and unexpected weight change.  HENT: Negative for congestion and sinus pressure.   Respiratory: Positive for shortness of breath. Negative for cough and chest tightness.   Cardiovascular: Negative for chest pain, palpitations and leg swelling.  Gastrointestinal: Negative for abdominal pain, diarrhea, nausea and vomiting.  Genitourinary: Negative for difficulty urinating and dysuria.  Musculoskeletal: Negative for joint swelling and myalgias.  Skin: Negative for color change and rash.  Neurological: Positive for light-headedness. Negative for dizziness and headaches.  Psychiatric/Behavioral: Negative for agitation and dysphoric mood.       Objective:    Physical Exam Constitutional:      General: She is not in acute distress.    Appearance: Normal appearance.  HENT:     Head: Normocephalic and atraumatic.     Right Ear: External ear normal.     Left Ear: External ear normal.  Eyes:     General: No scleral icterus.       Right eye: No discharge.        Left eye: No discharge.     Conjunctiva/sclera: Conjunctivae normal.  Neck:     Thyroid: No thyromegaly.  Cardiovascular:     Rate and Rhythm: Normal rate and regular rhythm.  Pulmonary:     Effort: No respiratory distress.     Breath sounds: Normal breath sounds. No wheezing.  Abdominal:     General: Bowel sounds are normal.     Palpations: Abdomen is soft.      Tenderness: There is no abdominal tenderness.  Musculoskeletal:        General: No swelling or tenderness.     Cervical back: Neck supple. No tenderness.  Lymphadenopathy:     Cervical: No cervical adenopathy.  Skin:    Findings: No erythema or rash.  Neurological:     Mental Status: She is alert.  Psychiatric:        Mood and Affect: Mood normal.        Behavior: Behavior normal.     BP 124/70   Pulse 81   Temp (!) 97 F (36.1 C)   Resp 16   Ht '5\' 8"'  (1.727 m)   Wt 218 lb 6.4 oz (99.1 kg)  SpO2 97%   BMI 33.21 kg/m  Wt Readings from Last 3 Encounters:  10/09/19 218 lb 6.4 oz (99.1 kg)  08/21/19 219 lb 3.2 oz (99.4 kg)  05/14/19 215 lb (97.5 kg)     Lab Results  Component Value Date   WBC 6.6 08/21/2019   HGB 12.2 08/21/2019   HCT 36.1 08/21/2019   PLT 280.0 08/21/2019   GLUCOSE 88 08/21/2019   CHOL 203 (H) 08/21/2019   TRIG 115.0 08/21/2019   HDL 46.50 08/21/2019   LDLCALC 133 (H) 08/21/2019   ALT 12 10/09/2019   AST 15 10/09/2019   NA 141 08/21/2019   K 4.1 08/21/2019   CL 104 08/21/2019   CREATININE 0.99 08/21/2019   BUN 17 08/21/2019   CO2 30 08/21/2019   TSH 2.25 08/21/2019   HGBA1C 6.0 08/21/2019    MM 3D SCREEN BREAST BILATERAL  Result Date: 10/02/2019 CLINICAL DATA:  Screening. EXAM: DIGITAL SCREENING BILATERAL MAMMOGRAM WITH TOMO AND CAD COMPARISON:  Previous exam(s). ACR Breast Density Category c: The breast tissue is heterogeneously dense, which may obscure small masses. FINDINGS: There are no findings suspicious for malignancy. Images were processed with CAD. IMPRESSION: No mammographic evidence of malignancy. A result letter of this screening mammogram will be mailed directly to the patient. RECOMMENDATION: Screening mammogram in one year. (Code:SM-B-01Y) BI-RADS CATEGORY  1: Negative. Electronically Signed   By: Audie Pinto M.D.   On: 10/02/2019 11:49       Assessment & Plan:   Problem List Items Addressed This Visit    Essential  hypertension, benign    Blood pressure has been under good control.  Continue lisinopril/hctz.  Follow pressures.  Follow metabolic panel.       Hypercholesteremia    Started crestor.  Check liver panel today.  Will follow up with fasting labs prior to her next appt.        Relevant Orders   Hepatic function panel (Completed)   Hyperglycemia    Low carb diet and exercise.  Follow met b and a1c.        Light headedness    Occasionally will notice light headedness.  Stay hydrated.  Cardiac w/up as outlined.  Follow.        Relevant Orders   Ambulatory referral to Cardiology   Mild depression ([place])    On zoloft.  Discussed.  Overall she feels she is handling things relatively well.  Desires no further intervention at this time.  Follow.        SOB (shortness of breath) - Primary    Has noticed having to "catch her breath" /some sob recently as outlined.  EKG - SR with flattening and TWI III. Given symptoms and risk factors will refer to cardiology for evaluation and question of need for any further cardiac w/up.  Pt in agreement.        Relevant Orders   EKG 12-Lead   Ambulatory referral to Cardiology       Einar Pheasant, MD

## 2019-10-09 NOTE — Assessment & Plan Note (Signed)
Started crestor.  Check liver panel today.  Will follow up with fasting labs prior to her next appt.

## 2019-10-09 NOTE — Assessment & Plan Note (Signed)
Low carb diet and exercise.  Follow met b and a1c.   

## 2019-10-10 ENCOUNTER — Encounter: Payer: Self-pay | Admitting: Internal Medicine

## 2019-10-13 ENCOUNTER — Encounter: Payer: Self-pay | Admitting: Internal Medicine

## 2019-10-13 NOTE — Assessment & Plan Note (Signed)
Occasionally will notice light headedness.  Stay hydrated.  Cardiac w/up as outlined.  Follow.

## 2019-10-13 NOTE — Assessment & Plan Note (Signed)
On zoloft.  Discussed.  Overall she feels she is handling things relatively well.  Desires no further intervention at this time.  Follow.

## 2019-10-13 NOTE — Assessment & Plan Note (Signed)
Blood pressure has been under good control.  Continue lisinopril/hctz.  Follow pressures.  Follow metabolic panel.

## 2019-10-13 NOTE — Assessment & Plan Note (Signed)
Has noticed having to "catch her breath" /some sob recently as outlined.  EKG - SR with flattening and TWI III. Given symptoms and risk factors will refer to cardiology for evaluation and question of need for any further cardiac w/up.  Pt in agreement.

## 2019-10-15 ENCOUNTER — Other Ambulatory Visit: Payer: Self-pay | Admitting: Internal Medicine

## 2019-10-15 MED ORDER — ROSUVASTATIN CALCIUM 10 MG PO TABS: 10.0000 mg | ORAL_TABLET | Freq: Every day | ORAL | 0 refills | Status: DC

## 2019-10-15 MED ORDER — ESOMEPRAZOLE MAGNESIUM 40 MG PO CPDR: 40.0000 mg | DELAYED_RELEASE_CAPSULE | Freq: Every day | ORAL | 0 refills | Status: DC

## 2019-10-17 ENCOUNTER — Ambulatory Visit: Payer: Medicare HMO | Admitting: Cardiology

## 2019-10-17 ENCOUNTER — Encounter: Payer: Self-pay | Admitting: Cardiology

## 2019-10-17 ENCOUNTER — Other Ambulatory Visit: Payer: Self-pay

## 2019-10-17 VITALS — BP 145/86 | HR 92 | Ht 67.25 in | Wt 218.2 lb

## 2019-10-17 DIAGNOSIS — R0602 Shortness of breath: Secondary | ICD-10-CM

## 2019-10-17 DIAGNOSIS — I10 Essential (primary) hypertension: Secondary | ICD-10-CM | POA: Diagnosis not present

## 2019-10-17 DIAGNOSIS — E7800 Pure hypercholesterolemia, unspecified: Secondary | ICD-10-CM | POA: Diagnosis not present

## 2019-10-17 NOTE — Patient Instructions (Signed)
Medication Instructions:  No changes  *If you need a refill on your cardiac medications before your next appointment, please call your pharmacy*   Lab Work: None  If you have labs (blood work) drawn today and your tests are completely normal, you will receive your results only by: Marland Kitchen MyChart Message (if you have MyChart) OR . A paper copy in the mail If you have any lab test that is abnormal or we need to change your treatment, we will call you to review the results.   Testing/Procedures: Your physician has requested that you have an echocardiogram. Echocardiography is a painless test that uses sound waves to create images of your heart. It provides your doctor with information about the size and shape of your heart and how well your heart's chambers and valves are working. This procedure takes approximately one hour. There are no restrictions for this procedure.     Follow-Up: At [facility], you and your health needs are our priority.  As part of our continuing mission to provide you with exceptional heart care, we have created designated Provider Care Teams.  These Care Teams include your primary Cardiologist (physician) and Advanced Practice Providers (APPs -  Physician Assistants and Nurse Practitioners) who all work together to provide you with the care you need, when you need it.  Your next appointment:   Follow up after testing.   The format for your next appointment:   In Person  Provider:    You may see Debbe Odea, MD or one of the following Advanced Practice Providers on your designated Care Team:    Nicolasa Ducking, NP  Eula Listen, PA-C  Marisue Ivan, PA-C

## 2019-10-17 NOTE — Progress Notes (Signed)
Cardiology Office Note:    Date:  10/17/2019   ID:  [name], DOB [birth year], MRN [id number]  PCP:  Dale [place], MD  Cardiologist:  Debbe Odea, MD  Electrophysiologist:  None   Referring MD: Dale [place], MD   Chief Complaint  Patient presents with   office visit    lightheadedness and DOE; Meds verbally reviewed with patient.    History of Present Illness:    Susan Miller is a 74 y.o. female with a hx of hypertension who presents due to shortness of breath and abnormal EKG.  Patient states being chronically short of breath.  She however exercises on a stationary bike for 10 minutes 2 times a week.  She denies any chest pain or shortness of breath when she exercises.  She endorses gaining weight since her last visit which had made her overall deconditioned.  She saw her primary care provider for regular visit where EKG was deemed abnormal.  She otherwise feels okay apart from occasional shortness of breath sometimes when she walks.  He states the pandemic overall has had an effect on her.   Past Medical History:  Diagnosis Date   Allergy    Depression    History of chicken pox    Hx: UTI (urinary tract infection)    Hypertension     Past Surgical History:  Procedure Laterality Date   COLONOSCOPY WITH PROPOFOL N/A 06/20/2016   Procedure: COLONOSCOPY WITH PROPOFOL;  Surgeon: Midge Minium, MD;  Location: [facility] ENDOSCOPY;  Service: Endoscopy;  Laterality: N/A;   ESOPHAGOGASTRODUODENOSCOPY (EGD) WITH PROPOFOL N/A 06/20/2016   Procedure: ESOPHAGOGASTRODUODENOSCOPY (EGD) WITH PROPOFOL;  Surgeon: Midge Minium, MD;  Location: [facility] ENDOSCOPY;  Service: Endoscopy;  Laterality: N/A;   TONSILLECTOMY     as a child   TUMOR REMOVAL      Current Medications: Current Meds  Medication Sig   conjugated estrogens (PREMARIN) vaginal cream Place 1 Applicatorful vaginally daily. Use pea sized amount M-W-Fr before bedtime   esomeprazole (NEXIUM) 40 MG capsule Take  1 capsule (40 mg total) by mouth daily at 12 noon.   lisinopril-hydrochlorothiazide (ZESTORETIC) 10-12.5 MG tablet TAKE 1 TABLET EVERY DAY   rosuvastatin (CRESTOR) 10 MG tablet Take 1 tablet (10 mg total) by mouth daily.   sertraline (ZOLOFT) 25 MG tablet TAKE 1 TABLET EVERY DAY     Allergies:   Codeine, Iodinated diagnostic agents, and Nitrofurantoin monohyd macro   Social History   Socioeconomic History   Marital status: Divorced    Spouse name: Not on file   Number of children: Not on file   Years of education: Not on file   Highest education level: Not on file  Occupational History   Not on file  Tobacco Use   Smoking status: Never Smoker   Smokeless tobacco: Never Used  Vaping Use   Vaping Use: Never used  Substance and Sexual Activity   Alcohol use: No    Alcohol/week: 0.0 standard drinks   Drug use: No   Sexual activity: Not Currently  Other Topics Concern   Not on file  Social History Narrative   Not on file   Social Determinants of Health   Financial Resource Strain: Low Risk    Difficulty of Paying Living Expenses: Not hard at all  Food Insecurity: No Food Insecurity   Worried About Programme researcher, broadcasting/film/video in the Last Year: Never true   Ran Out of Food in the Last Year: Never true  Transportation Needs: No Transportation  Needs   Lack of Transportation (Medical): No   Lack of Transportation (Non-Medical): No  Physical Activity: Insufficiently Active   Days of Exercise per Week: 1 day   Minutes of Exercise per Session: 20 min  Stress: No Stress Concern Present   Feeling of Stress : Not at all  Social Connections: Unknown   Frequency of Communication with Friends and Family: More than three times a week   Frequency of Social Gatherings with Friends and Family: More than three times a week   Attends Religious Services: Not on Scientist, clinical (histocompatibility and immunogenetics) or Organizations: Not on file   Attends Banker Meetings: Not on  file   Marital Status: Not on file     Family History: The patient's family history includes Breast cancer (age of onset: 48) in her maternal aunt; Cancer in her sister; Diabetes in her sister; Hypertension in her brother, maternal aunt, maternal grandmother, maternal uncle, mother, and sister; Stroke in her maternal grandmother.  ROS:   Please see the history of present illness.     All other systems reviewed and are negative.  EKGs/Labs/Other Studies Reviewed:    The following studies were reviewed today:   EKG:  EKG is  ordered today.  The ekg ordered today demonstrates normal sinus rhythm, normal ECG.  Recent Labs: 11/09/2018: B Natriuretic Peptide 90.0 08/21/2019: BUN 17; Creatinine, Ser 0.99; Hemoglobin 12.2; Platelets 280.0; Potassium 4.1; Sodium 141; TSH 2.25 10/09/2019: ALT 12  Recent Lipid Panel    Component Value Date/Time   CHOL 203 (H) 08/21/2019 1113   CHOL 182 04/23/2017 0759   TRIG 115.0 08/21/2019 1113   HDL 46.50 08/21/2019 1113   HDL 43 04/23/2017 0759   CHOLHDL 4 08/21/2019 1113   VLDL 23.0 08/21/2019 1113   LDLCALC 133 (H) 08/21/2019 1113   LDLCALC 120 (H) 04/23/2017 0759    Physical Exam:    VS:  BP (!) 145/86 (BP Location: Left Arm, Patient Position: Sitting, Cuff Size: Normal)    Pulse 92    Ht 5' 7.25" (1.708 m)    Wt 218 lb 4 oz (99 kg)    SpO2 97%    BMI 33.93 kg/m     Wt Readings from Last 3 Encounters:  10/17/19 218 lb 4 oz (99 kg)  10/09/19 218 lb 6.4 oz (99.1 kg)  08/21/19 219 lb 3.2 oz (99.4 kg)     GEN:  Well nourished, well developed in no acute distress HEENT: Normal NECK: No JVD; No carotid bruits LYMPHATICS: No lymphadenopathy CARDIAC: RRR, no murmurs, rubs, gallops RESPIRATORY:  Clear to auscultation without rales, wheezing or rhonchi  ABDOMEN: Soft, non-tender, non-distended MUSCULOSKELETAL:  No edema; No deformity  SKIN: Warm and dry NEUROLOGIC:  Alert and oriented x 3 PSYCHIATRIC:  Normal affect   ASSESSMENT:    1. SOB  (shortness of breath)   2. Essential hypertension   3. Pure hypercholesterolemia      PLAN:    In order of problems listed above:  1. Patient with nonspecific shortness of breath.  She has had the symptoms for a while now.  Will get echocardiogram to rule out any structural heart defects.  EKG obtained today was normal.  She exercises twice a week for 10 minutes on a stationary bike without any symptoms of chest pain or shortness of breath.  I believe her symptoms are likely due to deconditioning.  Patient was encouraged to continue exercising as tolerated. 2. History of hypertension, BP elevated  today, last couple of visits have been normal.  Continue current BP meds for now. 3. History of hyperlipidemia, continue statin.  Follow-up after echocardiogram.   This note was generated in part or whole with voice recognition software. Voice recognition is usually quite accurate but there are transcription errors that can and very often do occur. I apologize for any typographical errors that were not detected and corrected.    Medication Adjustments/Labs and Tests Ordered: Current medicines are reviewed at length with the patient today.  Concerns regarding medicines are outlined above.  Orders Placed This Encounter  Procedures   EKG 12-Lead   ECHOCARDIOGRAM COMPLETE   No orders of the defined types were placed in this encounter.   Patient Instructions  Medication Instructions:  No changes  *If you need a refill on your cardiac medications before your next appointment, please call your pharmacy*   Lab Work: None  If you have labs (blood work) drawn today and your tests are completely normal, you will receive your results only by:  [place] (if you have MyChart) OR  A paper copy in the mail If you have any lab test that is abnormal or we need to change your treatment, we will call you to review the results.   Testing/Procedures: Your physician has requested that you  have an echocardiogram. Echocardiography is a painless test that uses sound waves to create images of your heart. It provides your doctor with information about the size and shape of your heart and how well your hearts chambers and valves are working. This procedure takes approximately one hour. There are no restrictions for this procedure.     Follow-Up: At [facility], you and your health needs are our priority.  As part of our continuing mission to provide you with exceptional heart care, we have created designated Provider Care Teams.  These Care Teams include your primary Cardiologist (physician) and Advanced Practice Providers (APPs -  Physician Assistants and Nurse Practitioners) who all work together to provide you with the care you need, when you need it.  Your next appointment:   Follow up after testing.   The format for your next appointment:   In Person  Provider:    You may see Kate Sable, MD or one of the following Advanced Practice Providers on your designated Care Team:    Murray Hodgkins, NP  Christell Faith, PA-C  Marrianne Mood, PA-C      Signed, Kate Sable, MD  10/17/2019 2:19 PM    [place]

## 2019-11-21 ENCOUNTER — Ambulatory Visit (INDEPENDENT_AMBULATORY_CARE_PROVIDER_SITE_OTHER): Payer: Medicare HMO

## 2019-11-21 ENCOUNTER — Other Ambulatory Visit: Payer: Self-pay

## 2019-11-21 DIAGNOSIS — R0602 Shortness of breath: Secondary | ICD-10-CM | POA: Diagnosis not present

## 2019-11-21 LAB — ECHOCARDIOGRAM COMPLETE
AR max vel: 3.29 cm2
AV Area VTI: 3.39 cm2
AV Area mean vel: 3.04 cm2
AV Mean grad: 3 mmHg
AV Peak grad: 4.6 mmHg
Ao pk vel: 1.07 m/s
Area-P 1/2: 2.72 cm2
Calc EF: 53.9 %
S' Lateral: 3.1 cm
Single Plane A2C EF: 52.8 %
Single Plane A4C EF: 55.8 %

## 2019-11-27 ENCOUNTER — Other Ambulatory Visit: Payer: Self-pay

## 2019-11-27 ENCOUNTER — Ambulatory Visit (INDEPENDENT_AMBULATORY_CARE_PROVIDER_SITE_OTHER): Payer: Medicare HMO | Admitting: Internal Medicine

## 2019-11-27 VITALS — BP 122/70 | HR 100 | Temp 98.0°F | Resp 16 | Ht 67.0 in | Wt 217.0 lb

## 2019-11-27 DIAGNOSIS — R300 Dysuria: Secondary | ICD-10-CM

## 2019-11-27 DIAGNOSIS — E559 Vitamin D deficiency, unspecified: Secondary | ICD-10-CM

## 2019-11-27 DIAGNOSIS — E7800 Pure hypercholesterolemia, unspecified: Secondary | ICD-10-CM | POA: Diagnosis not present

## 2019-11-27 DIAGNOSIS — R739 Hyperglycemia, unspecified: Secondary | ICD-10-CM

## 2019-11-27 DIAGNOSIS — I10 Essential (primary) hypertension: Secondary | ICD-10-CM

## 2019-11-27 DIAGNOSIS — R0683 Snoring: Secondary | ICD-10-CM

## 2019-11-27 DIAGNOSIS — F32A Depression, unspecified: Secondary | ICD-10-CM

## 2019-11-27 DIAGNOSIS — K219 Gastro-esophageal reflux disease without esophagitis: Secondary | ICD-10-CM

## 2019-11-27 DIAGNOSIS — F320 Major depressive disorder, single episode, mild: Secondary | ICD-10-CM | POA: Diagnosis not present

## 2019-11-27 LAB — POCT URINALYSIS DIPSTICK
Glucose, UA: NEGATIVE
Ketones, UA: NEGATIVE
Protein, UA: POSITIVE — AB
Spec Grav, UA: 1.025 (ref 1.010–1.025)
Urobilinogen, UA: 0.2 E.U./dL
pH, UA: 5.5 (ref 5.0–8.0)

## 2019-11-27 LAB — URINALYSIS, MICROSCOPIC ONLY

## 2019-11-27 MED ORDER — CEFDINIR 250 MG/5ML PO SUSR: ORAL | 0 refills | Status: DC

## 2019-11-27 NOTE — Progress Notes (Signed)
[name], female   DOB: 1945-06-04, 74 y.o.   MRN: [id number]   Subjective:    [name], female    DOB: 1946-03-26, 74 y.o.   MRN: [id number]  HPI This visit occurred during the SARS-CoV-2 public health emergency.  Safety protocols were in place, including screening questions prior to the visit, additional usage of staff PPE, and extensive cleaning of exam room while observing appropriate contact time as indicated for disinfecting solutions.  Patient here for a scheduled follow up.  Still with increased stress - stress with family issues.  Discussed with her today.  Overall she feels she is handling things ok.  Desires no further intervention.  Not exercising.  No chest pain.  Saw cardiology recently for sob.  Note and w/up reviewed.  Alpha ok.  Bowels stable.  No significant abdominal pain.  No back pain.  Does report dysuria.  No vaginal symptoms.  No blood.  Dysuria started in the last 24 hours.  She has noticed over the last 2 months - increased nocturia.  Also reports increased daytime somnolence, snores and wakes up not feeling rested.    Past Medical History:  Diagnosis Date  . Allergy   . Depression   . History of chicken pox   . Hx: UTI (urinary tract infection)   . Hypertension    Past Surgical History:  Procedure Laterality Date  . COLONOSCOPY WITH PROPOFOL N/A 06/20/2016   Procedure: COLONOSCOPY WITH PROPOFOL;  Surgeon: Lucilla Lame, MD;  Location: [facility] END[facility]COPY;  Service: Endoscopy;  Laterality: N/A;  . ESOPHAGOGASTRODUODENOSCOPY (EGD) WITH PROPOFOL N/A 06/20/2016   Procedure: ESOPHAGOGASTRODUODENOSCOPY (EGD) WITH PROPOFOL;  Surgeon: Lucilla Lame, MD;  Location: [facility] ENDOSCOPY;  Service: Endoscopy;  Laterality: N/A;  . TONSILLECTOMY     as a child  . TUMOR REMOVAL     Family History  Problem Relation Age of Onset  . Hypertension Mother   . Cancer Sister        colon cancer  . Hypertension Sister   . Diabetes Sister   . Hypertension  Brother   . Hypertension Maternal Aunt   . Breast cancer Maternal Aunt 60  . Hypertension Maternal Uncle   . Stroke Maternal Grandmother   . Hypertension Maternal Grandmother    Social History   Socioeconomic History  . Marital status: Divorced    Spouse name: Not on file  . Number of children: Not on file  . Years of education: Not on file  . Highest education level: Not on file  Occupational History  . Not on file  Tobacco Use  . Smoking status: Never Smoker  . Smokeless tobacco: Never Used  Vaping Use  . Vaping Use: Never used  Substance and Sexual Activity  . Alcohol use: No    Alcohol/week: 0.0 standard drinks  . Drug use: No  . Sexual activity: Not Currently  Other Topics Concern  . Not on file  Social History Narrative  . Not on file   Social Determinants of Health   Financial Resource Strain: Low Risk   . Difficulty of Paying Living Expenses: Not hard at all  Food Insecurity: No Food Insecurity  . Worried About Charity fundraiser in the Last Year: Never true  . Ran Out of Food in the Last Year: Never true  Transportation Needs: No Transportation Needs  . Lack of Transportation (Medical): No  . Lack of Transportation (Non-Medical): No  Physical Activity: Insufficiently Active  . Days  of Exercise per Week: 1 day  . Minutes of Exercise per Session: 20 min  Stress: No Stress Concern Present  . Feeling of Stress : Not at all  Social Connections: Unknown  . Frequency of Communication with Friends and Family: More than three times a week  . Frequency of Social Gatherings with Friends and Family: More than three times a week  . Attends Religious Services: Not on file  . Active Member of Clubs or Organizations: Not on file  . Attends Archivist Meetings: Not on file  . Marital Status: Not on file    Outpatient Encounter Medications as of 11/27/2019  Medication Sig  . conjugated estrogens (PREMARIN) vaginal cream Place 1 Applicatorful vaginally daily.  Use pea sized amount M-W-Fr before bedtime  . esomeprazole (NEXIUM) 40 MG capsule Take 1 capsule (40 mg total) by mouth daily at 12 noon.  Marland Kitchen lisinopril-hydrochlorothiazide (ZESTORETIC) 10-12.5 MG tablet TAKE 1 TABLET EVERY DAY  . rosuvastatin (CRESTOR) 10 MG tablet Take 1 tablet (10 mg total) by mouth daily.  . sertraline (ZOLOFT) 25 MG tablet TAKE 1 TABLET EVERY DAY  . cefdinir (OMNICEF) 250 MG/5ML suspension Take 6cc's (34m) bid x 5 days   No facility-administered encounter medications on file as of 11/27/2019.    Review of Systems  Constitutional: Negative for appetite change and unexpected weight change.  HENT: Negative for congestion and sinus pressure.   Respiratory: Negative for cough, chest tightness and shortness of breath.   Cardiovascular: Negative for chest pain, palpitations and leg swelling.  Gastrointestinal: Negative for abdominal pain, diarrhea and vomiting.  Genitourinary: Positive for dysuria and frequency. Negative for difficulty urinating.       Nocturia  Musculoskeletal: Negative for joint swelling and myalgias.  Neurological: Negative for dizziness, light-headedness and headaches.  Psychiatric/Behavioral: Negative for agitation and dysphoric mood.       Increased stress.         Objective:    Physical Exam Vitals reviewed.  Constitutional:      General: She is not in acute distress.    Appearance: Normal appearance.  HENT:     Head: Normocephalic and atraumatic.     Right Ear: External ear normal.     Left Ear: External ear normal.  Eyes:     General: No scleral icterus.       Right eye: No discharge.        Left eye: No discharge.     Conjunctiva/sclera: Conjunctivae normal.  Neck:     Thyroid: No thyromegaly.  Cardiovascular:     Rate and Rhythm: Normal rate and regular rhythm.  Pulmonary:     Effort: No respiratory distress.     Breath sounds: Normal breath sounds. No wheezing.  Abdominal:     General: Bowel sounds are normal.      Palpations: Abdomen is soft.     Tenderness: There is no abdominal tenderness.  Musculoskeletal:        General: No swelling or tenderness.     Cervical back: Neck supple. No tenderness.  Lymphadenopathy:     Cervical: No cervical adenopathy.  Skin:    Findings: No erythema or rash.  Neurological:     Mental Status: She is alert.  Psychiatric:        Mood and Affect: Mood normal.        Behavior: Behavior normal.     BP 122/70   Pulse 100   Temp 98 F (36.7 C)   Resp 16  Ht '5\' 7"'  (1.702 m)   Wt 217 lb (98.4 kg)   SpO2 98%   BMI 33.99 kg/m  Wt Readings from Last 3 Encounters:  11/27/19 217 lb (98.4 kg)  10/17/19 218 lb 4 oz (99 kg)  10/09/19 218 lb 6.4 oz (99.1 kg)     Lab Results  Component Value Date   WBC 6.6 08/21/2019   HGB 12.2 08/21/2019   HCT 36.1 08/21/2019   PLT 280.0 08/21/2019   GLUCOSE 88 08/21/2019   CHOL 203 (H) 08/21/2019   TRIG 115.0 08/21/2019   HDL 46.50 08/21/2019   LDLCALC 133 (H) 08/21/2019   ALT 12 10/09/2019   AST 15 10/09/2019   NA 141 08/21/2019   K 4.1 08/21/2019   CL 104 08/21/2019   CREATININE 0.99 08/21/2019   BUN 17 08/21/2019   CO2 30 08/21/2019   TSH 2.25 08/21/2019   HGBA1C 6.0 08/21/2019    MM 3D SCREEN BREAST BILATERAL  Result Date: 10/02/2019 CLINICAL DATA:  Screening. EXAM: DIGITAL SCREENING BILATERAL MAMMOGRAM WITH TOMO AND CAD COMPARISON:  Previous exam(s). ACR Breast Density Category c: The breast tissue is heterogeneously dense, which may obscure small masses. FINDINGS: There are no findings suspicious for malignancy. Images were processed with CAD. IMPRESSION: No mammographic evidence of malignancy. A result letter of this screening mammogram will be mailed directly to the patient. RECOMMENDATION: Screening mammogram in one year. (Code:SM-B-01Y) BI-RADS CATEGORY  1: Negative. Electronically Signed   By: Audie Pinto M.D.   On: 10/02/2019 11:49       Assessment & Plan:   Problem List Items Addressed This  Visit    Dysuria - Primary    Symptoms and urine appear to be c/w UTI.  Treat with omnicef.  Await urine culture.       Relevant Orders   Urine Culture (Completed)   Urine Microscopic (Completed)   POCT urinalysis dipstick (Completed)   Essential hypertension, benign    Blood pressures as outlined.  Continue lisinopril/hctz.  Follow pressures.  Follow metabolic panel.       GERD (gastroesophageal reflux disease)    No upper symptoms reported.  On nexium.       Hypercholesteremia    On statin medication.  Low cholesterol diet and exercise.  Follow lipid panel and liver function tests.        Hyperglycemia    Low carb diet and exercise.  Follow met b and a1c.       Mild depression ([place])    On zoloft. Discussed with her today.  Desires no further intervention.  Follow.        Snoring    Snoring, increased fatigue and daytime somnolence.  Discussed possibility of sleep apnea.  Refer to pulmonary for further evaluation.        Relevant Orders   Ambulatory referral to Pulmonology   Vitamin D deficiency    Follow vitamin d level.           Einar Pheasant, MD

## 2019-11-29 ENCOUNTER — Other Ambulatory Visit: Payer: Self-pay | Admitting: Internal Medicine

## 2019-11-29 DIAGNOSIS — R319 Hematuria, unspecified: Secondary | ICD-10-CM

## 2019-11-29 LAB — URINE CULTURE
MICRO NUMBER:: 10737305
SPECIMEN QUALITY:: ADEQUATE

## 2019-11-29 NOTE — Progress Notes (Signed)
Order placed for f/u urinalysis 

## 2019-12-07 ENCOUNTER — Encounter: Payer: Self-pay | Admitting: Internal Medicine

## 2019-12-07 DIAGNOSIS — R300 Dysuria: Secondary | ICD-10-CM | POA: Insufficient documentation

## 2019-12-07 DIAGNOSIS — R0683 Snoring: Secondary | ICD-10-CM | POA: Insufficient documentation

## 2019-12-07 NOTE — Assessment & Plan Note (Signed)
On statin medication.  Low cholesterol diet and exercise.  Follow lipid panel and liver function tests.

## 2019-12-07 NOTE — Assessment & Plan Note (Signed)
Symptoms and urine appear to be c/w UTI.  Treat with omnicef.  Await urine culture.

## 2019-12-07 NOTE — Assessment & Plan Note (Signed)
Blood pressures as outlined.  Continue lisinopril/hctz.  Follow pressures.  Follow metabolic panel.

## 2019-12-07 NOTE — Assessment & Plan Note (Signed)
On zoloft. Discussed with her today.  Desires no further intervention.  Follow.

## 2019-12-07 NOTE — Assessment & Plan Note (Signed)
Snoring, increased fatigue and daytime somnolence.  Discussed possibility of sleep apnea.  Refer to pulmonary for further evaluation.

## 2019-12-07 NOTE — Assessment & Plan Note (Signed)
No upper symptoms reported.  On nexium.   

## 2019-12-07 NOTE — Assessment & Plan Note (Signed)
Low carb diet and exercise.  Follow met b and a1c.  

## 2019-12-07 NOTE — Assessment & Plan Note (Signed)
Follow vitamin d level.   

## 2019-12-12 DIAGNOSIS — H2513 Age-related nuclear cataract, bilateral: Secondary | ICD-10-CM | POA: Diagnosis not present

## 2019-12-17 ENCOUNTER — Other Ambulatory Visit: Payer: Self-pay | Admitting: Internal Medicine

## 2019-12-25 ENCOUNTER — Other Ambulatory Visit (INDEPENDENT_AMBULATORY_CARE_PROVIDER_SITE_OTHER): Payer: Medicare HMO

## 2019-12-25 ENCOUNTER — Other Ambulatory Visit: Payer: Self-pay

## 2019-12-25 DIAGNOSIS — R319 Hematuria, unspecified: Secondary | ICD-10-CM

## 2019-12-25 LAB — URINALYSIS, ROUTINE W REFLEX MICROSCOPIC
Bilirubin Urine: NEGATIVE
Hgb urine dipstick: NEGATIVE
Ketones, ur: NEGATIVE
Nitrite: NEGATIVE
Specific Gravity, Urine: 1.02 (ref 1.000–1.030)
Total Protein, Urine: NEGATIVE
Urine Glucose: NEGATIVE
Urobilinogen, UA: 0.2 (ref 0.0–1.0)
pH: 6.5 (ref 5.0–8.0)

## 2020-01-29 ENCOUNTER — Encounter: Payer: Self-pay | Admitting: Adult Health

## 2020-01-29 ENCOUNTER — Other Ambulatory Visit: Payer: Self-pay

## 2020-01-29 ENCOUNTER — Ambulatory Visit: Payer: Medicare HMO | Admitting: Adult Health

## 2020-01-29 VITALS — BP 132/78 | HR 85 | Temp 97.3°F | Ht 67.0 in | Wt 222.0 lb

## 2020-01-29 DIAGNOSIS — Z23 Encounter for immunization: Secondary | ICD-10-CM | POA: Diagnosis not present

## 2020-01-29 DIAGNOSIS — G4719 Other hypersomnia: Secondary | ICD-10-CM | POA: Diagnosis not present

## 2020-01-29 NOTE — Progress Notes (Signed)
@Patient  ID: , female    DOB: 11-Apr-1946, 74 y.o.   MRN: [id number]  Chief Complaint  Patient presents with  . sleep consult    Referring provider: [id number], MD  HPI: 74 year old female with history of hypertension presents January 29, 2020 for sleep consult for restless sleep, snoring, daytime sleepiness Medical history significant for seasonal allergies , hypertension and Hyperlipidemia.   TEST/EVENTS :   01/29/2020 Sleep consult  Patient presents today for a sleep consult.  She was referred by her primary care provider.  Patient complains of daytime sleepiness, loud snoring.  She says she typically gets up once or twice at nighttime.  Typically goes to bed around 10:50 PM.  Takes her about 20 minutes to fall asleep.  Gets up sometimes up to 3 times a night.  And gets up in the morning around 7 AM.  She has noticed that her weights been trending up over the last couple years about 20 pounds.  Epworth score 6.  Patient says she has loud snoring, restless sleep feels tired when she gets up in the morning and has daytime sleepiness.  Can fall asleep easily if she is watching TV or sitting and active. Family members report loud snoring .  No symptoms of cataplexy or sleep paralysis .   Social history patient is retired.  She is a never smoker. Son lives with her at home . No pets. No hobbies.  Not active , sedentary . Does housework .   No history of stroke or CHF .   Drinks 3 cups of coffee in am and 2 cups of sweet tea in evening .   Allergies  Allergen Reactions  . Codeine Hives  . Iodinated Diagnostic Agents Swelling  . Nitrofurantoin Monohyd Macro Nausea Only    Immunization History  Administered Date(s) Administered  . Fluad Quad(high Dose 65+) 01/31/2019  . Influenza, High Dose Seasonal PF 01/11/2016, 04/27/2017, 01/18/2018  . Influenza,inj,Quad PF,6+ Mos 02/24/2013, 02/27/2014, 02/22/2015  . Pneumococcal Conjugate-13 11/06/2016  . Pneumococcal  Polysaccharide-23 12/06/2017    Past Medical History:  Diagnosis Date  . Allergy   . Depression   . History of chicken pox   . Hx: UTI (urinary tract infection)   . Hypertension     Tobacco History: Social History   Tobacco Use  Smoking Status Never Smoker  Smokeless Tobacco Never Used   Counseling given: Not Answered   Outpatient Medications Prior to Visit  Medication Sig Dispense Refill  . conjugated estrogens (PREMARIN) vaginal cream Place 1 Applicatorful vaginally daily. Use pea sized amount M-W-Fr before bedtime 42.5 g 12  . esomeprazole (NEXIUM) 40 MG capsule TAKE 1 CAPSULE (40 MG TOTAL) BY MOUTH DAILY AT 12 NOON. 90 capsule 0  . lisinopril-hydrochlorothiazide (ZESTORETIC) 10-12.5 MG tablet TAKE 1 TABLET EVERY DAY 90 tablet 1  . rosuvastatin (CRESTOR) 10 MG tablet TAKE 1 TABLET (10 MG TOTAL) BY MOUTH DAILY. 90 tablet 0  . sertraline (ZOLOFT) 25 MG tablet TAKE 1 TABLET EVERY DAY 90 tablet 1  . cefdinir (OMNICEF) 250 MG/5ML suspension Take 6cc's (300mg ) bid x 5 days 60 mL 0   No facility-administered medications prior to visit.     Review of Systems:   Constitutional:   No  weight loss, night sweats,  Fevers, chills,  +fatigue, or  Lassitude.++weight gain   HEENT:   No headaches,  Difficulty swallowing,  Tooth/dental problems, or  Sore throat,  No sneezing, itching, ear ache,  +nasal congestion, post nasal drip,   CV:  No chest pain,  Orthopnea, PND, swelling in lower extremities, anasarca, dizziness, palpitations, syncope.   GI  No heartburn, indigestion, abdominal pain, nausea, vomiting, diarrhea, change in bowel habits, loss of appetite, bloody stools.   Resp: + shortness of breath with exertion or at rest.  No excess mucus, no productive cough,  No non-productive cough,  No coughing up of blood.  No change in color of mucus.  No wheezing.  No chest wall deformity  Skin: no rash or lesions.  GU: no dysuria, change in color of urine, no urgency  or frequency.  No flank pain, no hematuria   MS:  No joint pain or swelling.  No decreased range of motion.  No back pain.    Physical Exam  BP 132/78 (BP Location: Left Arm, Cuff Size: Normal)   Pulse 85   Temp (!) 97.3 F (36.3 C) (Temporal)   Ht 5\' 7"  (1.702 m)   Wt 222 lb (100.7 kg)   SpO2 97%   BMI 34.77 kg/m   GEN: A/Ox3; pleasant , NAD, BMI 34    HEENT:  /AT,    NOSE-clear, THROAT-clear, no lesions, no postnasal drip or exudate noted. Class 3 MP airway   NECK:  Supple w/ fair ROM; no JVD; normal carotid impulses w/o bruits; no thyromegaly or nodules palpated; no lymphadenopathy.    RESP  Clear  P & A; w/o, wheezes/ rales/ or rhonchi. no accessory muscle use, no dullness to percussion  CARD:  RRR, no m/r/g, no peripheral edema, pulses intact, no cyanosis or clubbing.  GI:   Soft & nt; nml bowel sounds; no organomegaly or masses detected.   Musco: Warm bil, no deformities or joint swelling noted.   Neuro: alert, no focal deficits noted.    Skin: Warm, no lesions or rashes    Lab Results:  CBC    Component Value Date/Time   WBC 6.6 08/21/2019 1113   RBC 4.03 08/21/2019 1113   HGB 12.2 08/21/2019 1113   HGB 12.3 04/27/2017 1130   HCT 36.1 08/21/2019 1113   HCT 36.7 04/27/2017 1130   PLT 280.0 08/21/2019 1113   PLT 294 04/27/2017 1130   MCV 89.7 08/21/2019 1113   MCV 89 04/27/2017 1130   MCH 30.1 06/12/2019 2030   MCHC 33.8 08/21/2019 1113   RDW 13.7 08/21/2019 1113   RDW 13.6 04/27/2017 1130   LYMPHSABS 2.5 08/21/2019 1113   LYMPHSABS 2.9 04/27/2017 1130   MONOABS 0.5 08/21/2019 1113   EOSABS 0.2 08/21/2019 1113   EOSABS 0.2 04/27/2017 1130   BASOSABS 0.0 08/21/2019 1113   BASOSABS 0.0 04/27/2017 1130    BMET    Component Value Date/Time   NA 141 08/21/2019 1113   NA 147 (H) 04/23/2017 0759   K 4.1 08/21/2019 1113   CL 104 08/21/2019 1113   CO2 30 08/21/2019 1113   GLUCOSE 88 08/21/2019 1113   BUN 17 08/21/2019 1113   BUN 19 04/23/2017  0759   CREATININE 0.99 08/21/2019 1113   CALCIUM 9.7 08/21/2019 1113   GFRNONAA 50 (L) 06/12/2019 2030   GFRAA 58 (L) 06/12/2019 2030    BNP    Component Value Date/Time   BNP 90.0 11/09/2018 0849    ProBNP No results found for: PROBNP  Imaging: No results found.    No flowsheet data found.  No results found for: NITRICOXIDE      Assessment &  Plan:   Daytime hypersomnolence Daytime hypersomnolence, restless sleep, snoring with BMI of 34.  Patient has risk factors for underlying obstructive sleep apnea.  We will proceed with a home sleep study . Healthy sleep regimen discussed in detail with patient. Patient education on underlying obstructive sleep apnea and potential treatment options if present.  Plan  Patient Instructions  Set up for home sleep study .  Healthy sleep regimen Cut back on caffeine, try to cut off by 2 pm.  Work on healthy weight loss.  Increase activity as tolerated  Do not drive if sleepy.  Follow up for telemedicine visit to discuss results 6 weeks.  Flu shot today .          Rubye Oaks, NP 01/29/2020

## 2020-01-29 NOTE — Progress Notes (Signed)
Reviewed and agree with assessment/plan.   Wildon Cuevas, MD [facility] 01/29/2020, 11:05 AM Pager:  [phone]  

## 2020-01-29 NOTE — Assessment & Plan Note (Signed)
Daytime hypersomnolence, restless sleep, snoring with BMI of 34.  Patient has risk factors for underlying obstructive sleep apnea.  We will proceed with a home sleep study . Healthy sleep regimen discussed in detail with patient. Patient education on underlying obstructive sleep apnea and potential treatment options if present.  Plan  Patient Instructions  Set up for home sleep study .  Healthy sleep regimen Cut back on caffeine, try to cut off by 2 pm.  Work on healthy weight loss.  Increase activity as tolerated  Do not drive if sleepy.  Follow up for telemedicine visit to discuss results 6 weeks.  Flu shot today .

## 2020-01-29 NOTE — Patient Instructions (Signed)
Set up for home sleep study .  Healthy sleep regimen Cut back on caffeine, try to cut off by 2 pm.  Work on healthy weight loss.  Increase activity as tolerated  Do not drive if sleepy.  Follow up for telemedicine visit to discuss results 6 weeks.  Flu shot today .

## 2020-02-05 ENCOUNTER — Encounter: Payer: Self-pay | Admitting: Internal Medicine

## 2020-02-06 NOTE — Telephone Encounter (Signed)
LMTCB

## 2020-02-11 ENCOUNTER — Telehealth: Payer: Self-pay | Admitting: Adult Health

## 2020-02-11 NOTE — Telephone Encounter (Signed)
Left message on VM to cb & scheduled.

## 2020-02-11 NOTE — Telephone Encounter (Signed)
Feels like bilateral legs are real tight but denies swelling on both legs" Skin is tight" , and toe on right foot throbs, and left leg feels like bugs crawling on her leg and other times there is a sharpe pain, this has been happening for the past year, NO swelling discomfort rated at 2 on 0 to pain scale.  Scheduled patient for Friday 02/20/20 advised patient if develops sharpe pain in calf or thigh , has heat or redness or swelling needs to be evaluated sooner at [facility] or call office back patient agreed. FYI

## 2020-02-11 NOTE — Telephone Encounter (Addendum)
Pt did not answer the phone when I called her to schedule the HST.  I will attempt to reach her again later.

## 2020-02-13 NOTE — Telephone Encounter (Signed)
Pt has been scheduled for 10/13 @ 3 in [place].

## 2020-02-18 ENCOUNTER — Ambulatory Visit: Payer: Medicare HMO

## 2020-02-18 DIAGNOSIS — G4733 Obstructive sleep apnea (adult) (pediatric): Secondary | ICD-10-CM | POA: Diagnosis not present

## 2020-02-18 DIAGNOSIS — G4719 Other hypersomnia: Secondary | ICD-10-CM

## 2020-02-20 ENCOUNTER — Encounter: Payer: Self-pay | Admitting: Internal Medicine

## 2020-02-20 ENCOUNTER — Other Ambulatory Visit: Payer: Self-pay

## 2020-02-20 ENCOUNTER — Ambulatory Visit (INDEPENDENT_AMBULATORY_CARE_PROVIDER_SITE_OTHER): Payer: Medicare HMO | Admitting: Internal Medicine

## 2020-02-20 VITALS — BP 108/70 | HR 80 | Temp 98.5°F | Resp 16 | Ht 67.0 in | Wt 220.0 lb

## 2020-02-20 DIAGNOSIS — I10 Essential (primary) hypertension: Secondary | ICD-10-CM | POA: Diagnosis not present

## 2020-02-20 DIAGNOSIS — E559 Vitamin D deficiency, unspecified: Secondary | ICD-10-CM | POA: Diagnosis not present

## 2020-02-20 DIAGNOSIS — G4719 Other hypersomnia: Secondary | ICD-10-CM

## 2020-02-20 DIAGNOSIS — E7800 Pure hypercholesterolemia, unspecified: Secondary | ICD-10-CM | POA: Diagnosis not present

## 2020-02-20 DIAGNOSIS — R200 Anesthesia of skin: Secondary | ICD-10-CM | POA: Diagnosis not present

## 2020-02-20 DIAGNOSIS — R739 Hyperglycemia, unspecified: Secondary | ICD-10-CM | POA: Diagnosis not present

## 2020-02-20 DIAGNOSIS — R202 Paresthesia of skin: Secondary | ICD-10-CM | POA: Diagnosis not present

## 2020-02-20 DIAGNOSIS — G4733 Obstructive sleep apnea (adult) (pediatric): Secondary | ICD-10-CM | POA: Diagnosis not present

## 2020-02-20 LAB — BASIC METABOLIC PANEL
BUN: 18 mg/dL (ref 6–23)
CO2: 29 mEq/L (ref 19–32)
Calcium: 9.6 mg/dL (ref 8.4–10.5)
Chloride: 103 mEq/L (ref 96–112)
Creatinine, Ser: 1.03 mg/dL (ref 0.40–1.20)
GFR: 53.52 mL/min — ABNORMAL LOW (ref >60.00)
Glucose, Bld: 93 mg/dL (ref 70–99)
Potassium: 4 mEq/L (ref 3.5–5.1)
Sodium: 138 mEq/L (ref 135–145)

## 2020-02-20 LAB — HEPATIC FUNCTION PANEL
ALT: 13 U/L (ref 0–35)
AST: 16 U/L (ref 0–37)
Albumin: 4.2 g/dL (ref 3.5–5.2)
Alkaline Phosphatase: 68 U/L (ref 39–117)
Bilirubin, Direct: 0.1 mg/dL (ref 0.0–0.3)
Total Bilirubin: 0.5 mg/dL (ref 0.2–1.2)
Total Protein: 6.8 g/dL (ref 6.0–8.3)

## 2020-02-20 LAB — CBC WITH DIFFERENTIAL/PLATELET
Basophils Absolute: 0 10*3/uL (ref 0.0–0.1)
Basophils Relative: 0.4 % (ref 0.0–3.0)
Eosinophils Absolute: 0.2 10*3/uL (ref 0.0–0.7)
Eosinophils Relative: 3.2 % (ref 0.0–5.0)
HCT: 36.6 % (ref 36.0–46.0)
Hemoglobin: 12.3 g/dL (ref 12.0–15.0)
Lymphocytes Relative: 35.1 % (ref 12.0–46.0)
Lymphs Abs: 2.6 10*3/uL (ref 0.7–4.0)
MCHC: 33.5 g/dL (ref 30.0–36.0)
MCV: 91.2 fl (ref 78.0–100.0)
Monocytes Absolute: 0.7 10*3/uL (ref 0.1–1.0)
Monocytes Relative: 9.2 % (ref 3.0–12.0)
Neutro Abs: 3.8 10*3/uL (ref 1.4–7.7)
Neutrophils Relative %: 52.1 % (ref 43.0–77.0)
Platelets: 250 10*3/uL (ref 150.0–400.0)
RBC: 4.01 Mil/uL (ref 3.87–5.11)
RDW: 13.2 % (ref 11.5–15.5)
WBC: 7.3 10*3/uL (ref 4.0–10.5)

## 2020-02-20 LAB — HEMOGLOBIN A1C: Hgb A1c MFr Bld: 6.4 % (ref 4.6–6.5)

## 2020-02-20 LAB — TSH: TSH: 2.46 u[IU]/mL (ref 0.35–4.50)

## 2020-02-20 LAB — VITAMIN B12: Vitamin B-12: 210 pg/mL — ABNORMAL LOW (ref 211–911)

## 2020-02-20 LAB — VITAMIN D 25 HYDROXY (VIT D DEFICIENCY, FRACTURES): VITD: 18.38 ng/mL — ABNORMAL LOW (ref 30.00–100.00)

## 2020-02-20 NOTE — Progress Notes (Addendum)
[name], female   DOB: [birth year], 74 y.o.   MRN: [id number]   Subjective:    [name], female    DOB: 04-16-1946, 74 y.o.   MRN: [id number]  HPI This visit occurred during the SARS-CoV-2 public health emergency.  Safety protocols were in place, including screening questions prior to the visit, additional usage of staff PPE, and extensive cleaning of exam room while observing appropriate contact time as indicated for disinfecting solutions.  Patient here for a work in appt.  Work in for concerns regarding leg pain.  On questioning her, she reports noticing some intermittent pain - right second toe.  Both feet feel tight.  Feels like her skin is stretched.  Left lower leg - up to knee - feels like bugs crawling on her leg.  Has been an issue for a while, but seems to be worse recently.  No weakness.  No falls.  No known triggers.  Tries to stay active.  Did have her sleep study.  Had flu vaccine - when getting sleep study.    Past Medical History:  Diagnosis Date  . Allergy   . Depression   . History of chicken pox   . Hx: UTI (urinary tract infection)   . Hypertension    Past Surgical History:  Procedure Laterality Date  . COLONOSCOPY WITH PROPOFOL N/A 06/20/2016   Procedure: COLONOSCOPY WITH PROPOFOL;  Surgeon: Midge Minium, MD;  Location: [facility];  Service: Endoscopy;  Laterality: N/A;  . ESOPHAGOGASTRODUODENOSCOPY (EGD) WITH PROPOFOL N/A 06/20/2016   Procedure: ESOPHAGOGASTRODUODENOSCOPY (EGD) WITH PROPOFOL;  Surgeon: Midge Minium, MD;  Location: [facility] ENDOSCOPY;  Service: Endoscopy;  Laterality: N/A;  . TONSILLECTOMY     as a child  . TUMOR REMOVAL     Family History  Problem Relation Age of Onset  . Hypertension Mother   . Cancer Sister        colon cancer  . Hypertension Sister   . Diabetes Sister   . Hypertension Brother   . Hypertension Maternal Aunt   . Breast cancer Maternal Aunt 60  . Hypertension Maternal Uncle   . Stroke  Maternal Grandmother   . Hypertension Maternal Grandmother    Social History   Socioeconomic History  . Marital status: Divorced    Spouse name: Not on file  . Number of children: Not on file  . Years of education: Not on file  . Highest education level: Not on file  Occupational History  . Not on file  Tobacco Use  . Smoking status: Never Smoker  . Smokeless tobacco: Never Used  Vaping Use  . Vaping Use: Never used  Substance and Sexual Activity  . Alcohol use: No    Alcohol/week: 0.0 standard drinks  . Drug use: No  . Sexual activity: Not Currently  Other Topics Concern  . Not on file  Social History Narrative  . Not on file   Social Determinants of Health   Financial Resource Strain: Low Risk   . Difficulty of Paying Living Expenses: Not hard at all  Food Insecurity: No Food Insecurity  . Worried About Programme researcher, broadcasting/film/video in the Last Year: Never true  . Ran Out of Food in the Last Year: Never true  Transportation Needs: No Transportation Needs  . Lack of Transportation (Medical): No  . Lack of Transportation (Non-Medical): No  Physical Activity: Insufficiently Active  . Days of Exercise per Week: 1 day  . Minutes of Exercise per  Session: 20 min  Stress: No Stress Concern Present  . Feeling of Stress : Not at all  Social Connections: Unknown  . Frequency of Communication with Friends and Family: More than three times a week  . Frequency of Social Gatherings with Friends and Family: More than three times a week  . Attends Religious Services: Not on file  . Active Member of Clubs or Organizations: Not on file  . Attends Archivist Meetings: Not on file  . Marital Status: Not on file    Outpatient Encounter Medications as of 02/20/2020  Medication Sig  . conjugated estrogens (PREMARIN) vaginal cream Place 1 Applicatorful vaginally daily. Use pea sized amount M-W-Fr before bedtime  . esomeprazole (NEXIUM) 40 MG capsule TAKE 1 CAPSULE (40 MG TOTAL) BY  MOUTH DAILY AT 12 NOON.  Marland Kitchen lisinopril-hydrochlorothiazide (ZESTORETIC) 10-12.5 MG tablet TAKE 1 TABLET EVERY DAY  . rosuvastatin (CRESTOR) 10 MG tablet TAKE 1 TABLET (10 MG TOTAL) BY MOUTH DAILY.  Marland Kitchen sertraline (ZOLOFT) 25 MG tablet TAKE 1 TABLET EVERY DAY   No facility-administered encounter medications on file as of 02/20/2020.    Review of Systems  Constitutional: Negative for appetite change and unexpected weight change.  Respiratory: Negative for cough, chest tightness and shortness of breath.   Cardiovascular: Negative for chest pain, palpitations and leg swelling.  Gastrointestinal: Negative for nausea and vomiting.  Musculoskeletal: Negative for joint swelling and myalgias.       Sensation change - feet.  Feel like bugs crawling - leg.    Skin: Negative for color change and rash.  Neurological: Negative for dizziness and headaches.  Psychiatric/Behavioral: Negative for agitation and dysphoric mood.       Objective:    Physical Exam Constitutional:      General: She is not in acute distress.    Appearance: Normal appearance.  HENT:     Head: Normocephalic and atraumatic.     Right Ear: External ear normal.     Left Ear: External ear normal.  Neck:     Thyroid: No thyromegaly.  Cardiovascular:     Rate and Rhythm: Normal rate and regular rhythm.  Pulmonary:     Effort: No respiratory distress.     Breath sounds: Normal breath sounds. No wheezing.  Abdominal:     General: Bowel sounds are normal.     Palpations: Abdomen is soft.     Tenderness: There is no abdominal tenderness.  Musculoskeletal:        General: No swelling or tenderness.     Cervical back: Neck supple.  Lymphadenopathy:     Cervical: No cervical adenopathy.  Skin:    Findings: No erythema.  Neurological:     Mental Status: She is alert.     Comments: Some minimal decreased sensation - toes when compared to ankle/legs.  No weakness.    Psychiatric:        Mood and Affect: Mood normal.         Behavior: Behavior normal.     BP 108/70   Pulse 80   Temp 98.5 F (36.9 C) (Oral)   Resp 16   Ht _0  (1.702 m)   Wt 220 lb (99.8 kg)   SpO2 98%   BMI 34.46 kg/m  Wt Readings from Last 3 Encounters:  02/20/20 220 lb (99.8 kg)  01/29/20 222 lb (100.7 kg)  11/27/19 217 lb (98.4 kg)     Lab Results  Component Value Date   WBC 7.3 02/20/2020  HGB 12.3 02/20/2020   HCT 36.6 02/20/2020   PLT 250.0 02/20/2020   GLUCOSE 93 02/20/2020   CHOL 203 (H) 08/21/2019   TRIG 115.0 08/21/2019   HDL 46.50 08/21/2019   LDLCALC 133 (H) 08/21/2019   ALT 13 02/20/2020   AST 16 02/20/2020   NA 138 02/20/2020   K 4.0 02/20/2020   CL 103 02/20/2020   CREATININE 1.03 02/20/2020   BUN 18 02/20/2020   CO2 29 02/20/2020   TSH 2.46 02/20/2020   HGBA1C 6.4 02/20/2020    MM 3D SCREEN BREAST BILATERAL  Result Date: 10/02/2019 CLINICAL DATA:  Screening. EXAM: DIGITAL SCREENING BILATERAL MAMMOGRAM WITH TOMO AND CAD COMPARISON:  Previous exam(s). ACR Breast Density Category c: The breast tissue is heterogeneously dense, which may obscure small masses. FINDINGS: There are no findings suspicious for malignancy. Images were processed with CAD. IMPRESSION: No mammographic evidence of malignancy. A result letter of this screening mammogram will be mailed directly to the patient. RECOMMENDATION: Screening mammogram in one year. (Code:SM-B-01Y) BI-RADS CATEGORY  1: Negative. Electronically Signed   By: Audie Pinto M.D.   On: 10/02/2019 11:49       Assessment & Plan:   Problem List Items Addressed This Visit    Vitamin D deficiency - Primary   Relevant Orders   VITAMIN D 25 Hydroxy (Vit-D Deficiency, Fractures) (Completed)   Numbness and tingling of both feet    Sensation change - feet and leg as outlined.  Check cbc, tsh and b12.  Also schedule for [facility] for further evaluation.        Relevant Orders   Vitamin B12 (Completed)   Ambulatory referral to Neurology   Hyperglycemia    Low carb  diet and exercise.  Follow met b and a1c.       Relevant Orders   Hemoglobin A1c (Completed)   Hypercholesteremia    On statin medication.  Has eaten today.  Unable to check cholesterol.  Check liver panel.       Relevant Orders   Hepatic function panel (Completed)   Essential hypertension, benign    Blood pressure doing well.  Continue lisinopril/hctz.  Follow pressures.  Follow metabolic panel.       Relevant Orders   TSH (Completed)   CBC with Differential/Platelet (Completed)   Basic metabolic panel (Completed)   Daytime hypersomnolence    Saw pulmonary.  Had her sleep study. Planning to follow up with pulmonary.            Einar Pheasant, MD

## 2020-02-21 ENCOUNTER — Encounter: Payer: Self-pay | Admitting: Internal Medicine

## 2020-02-21 NOTE — Assessment & Plan Note (Signed)
On statin medication.  Has eaten today.  Unable to check cholesterol.  Check liver panel.

## 2020-02-21 NOTE — Assessment & Plan Note (Signed)
Saw pulmonary.  Had her sleep study. Planning to follow up with pulmonary.

## 2020-02-21 NOTE — Assessment & Plan Note (Signed)
Sensation change - feet and leg as outlined.  Check cbc, tsh and b12.  Also schedule for [facility] for further evaluation.

## 2020-02-21 NOTE — Assessment & Plan Note (Signed)
Blood pressure doing well.  Continue lisinopril/hctz.  Follow pressures.  Follow metabolic panel.  

## 2020-02-21 NOTE — Addendum Note (Signed)
Addended by: Charm Barges on: 02/21/2020 09:20 PM   Modules accepted: Orders

## 2020-02-21 NOTE — Assessment & Plan Note (Signed)
Low carb diet and exercise.  Follow met b and a1c.  

## 2020-02-24 ENCOUNTER — Telehealth: Payer: Self-pay | Admitting: Adult Health

## 2020-02-24 NOTE — Telephone Encounter (Signed)
-----   Message from Coralyn Helling, MD sent at 02/20/2020  1:10 PM EDT ----- Home sleep study from 02/18/20 showed mild obstructive sleep apnea with an AHI of 5.4 and SpO2 low of 87%.  Thanks.  [place]

## 2020-02-24 NOTE — Telephone Encounter (Signed)
Called patient and scheduled a televisit for patient to discuss results of home sleep study on Tuesday 03/02/20 at 11 am.  Nothing further needed.

## 2020-02-24 NOTE — Telephone Encounter (Signed)
Please set up telemedicine/video visit to discuss test results

## 2020-02-26 ENCOUNTER — Ambulatory Visit (INDEPENDENT_AMBULATORY_CARE_PROVIDER_SITE_OTHER): Payer: Medicare HMO

## 2020-02-26 ENCOUNTER — Other Ambulatory Visit: Payer: Self-pay

## 2020-02-26 DIAGNOSIS — E538 Deficiency of other specified B group vitamins: Secondary | ICD-10-CM

## 2020-02-26 MED ORDER — CYANOCOBALAMIN 1000 MCG/ML IJ SOLN
1000.0000 ug | Freq: Once | INTRAMUSCULAR | Status: AC
  Administered 2020-02-26: 1000 ug via INTRAMUSCULAR

## 2020-02-26 NOTE — Progress Notes (Addendum)
Patient presented for B 12 injection to left deltoid, patient voiced no concerns nor showed any signs of distress during injection.  Reviewed.  Dr Scott 

## 2020-03-02 ENCOUNTER — Other Ambulatory Visit: Payer: Self-pay

## 2020-03-02 ENCOUNTER — Ambulatory Visit (INDEPENDENT_AMBULATORY_CARE_PROVIDER_SITE_OTHER): Payer: Medicare HMO | Admitting: Adult Health

## 2020-03-02 DIAGNOSIS — G4733 Obstructive sleep apnea (adult) (pediatric): Secondary | ICD-10-CM

## 2020-03-02 NOTE — Patient Instructions (Addendum)
Work on healthy weight loss Healthy sleep regimen as discussed Avoid sedating medications Do not drive if sleepy Follow-up in 6 months with Dr. Belia Heman and As needed

## 2020-03-02 NOTE — Progress Notes (Signed)
Virtual Visit via Telephone Note  I connected with Susan Miller on 03/02/20 at 11:00 AM EDT by telephone and verified that I am speaking with the correct person using two identifiers.  Location: Patient: Home  Provider: Office    I discussed the limitations, risks, security and privacy concerns of performing an evaluation and management service by telephone and the availability of in person appointments. I also discussed with the patient that there may be a patient responsible charge related to this service. The patient expressed understanding and agreed to proceed.   History of Present Illness: 74 year old female with history of hypertension seen for sleep consult January 29, 2020 for daytime sleepiness and snoring.  She was found to have very mild sleep apnea.  Today's telemedicine visit is for a follow-up for sleep apnea.  As above patient was seen for sleep consult last month.  She had daytime sleepiness restless sleep and snoring.  She was set up for a home sleep study that showed mild obstructive sleep apnea with AHI of 5.4/hour and SPO2 low at 87%.  We discussed first test results.  Patient does have symptoms but she considers a mild.  We discussed sleep apnea and potential symptoms and complications of sleep apnea.  We went over potential treatment options including weight loss, oral appliance, CPAP.  Patient says she would like to begin with a weight loss program and positional sleep.  We did discuss avoiding sedating medications.  Avoiding driving when sleepy.   Observations/Objective: Home sleep study February 18, 2020 showed mild obstructive sleep apnea with AHI 5.4/hour and SPO2 low at 87%.  Assessment and Plan: Mild obstructive sleep apnea-minimum symptom burden. Patient prefers to try weight loss Along with positional sleep Patient education on sleep apnea.  She is aware to avoid sedating medications  Plan  Patient Instructions  Work on healthy weight loss Healthy sleep  regimen as discussed Avoid sedating medications Do not drive if sleepy Follow-up in 6 months with Dr. Belia Heman and As needed         Follow Up Instructions:    I discussed the assessment and treatment plan with the patient. The patient was provided an opportunity to ask questions and all were answered. The patient agreed with the plan and demonstrated an understanding of the instructions.   The patient was advised to call back or seek an in-person evaluation if the symptoms worsen or if the condition fails to improve as anticipated.  I provided 22 minutes of non-face-to-face time during this encounter.   Rubye Oaks, NP

## 2020-03-04 ENCOUNTER — Other Ambulatory Visit: Payer: Self-pay

## 2020-03-04 ENCOUNTER — Ambulatory Visit (INDEPENDENT_AMBULATORY_CARE_PROVIDER_SITE_OTHER): Payer: Medicare HMO

## 2020-03-04 DIAGNOSIS — E538 Deficiency of other specified B group vitamins: Secondary | ICD-10-CM

## 2020-03-04 MED ORDER — CYANOCOBALAMIN 1000 MCG/ML IJ SOLN
1000.0000 ug | Freq: Once | INTRAMUSCULAR | Status: AC
  Administered 2020-03-04: 1000 ug via INTRAMUSCULAR

## 2020-03-04 NOTE — Progress Notes (Addendum)
Patient presented for B 12 injection to left deltoid, patient voiced no concerns nor showed any signs of distress during injection.  Reviewed.  Dr Scott 

## 2020-03-11 ENCOUNTER — Other Ambulatory Visit: Payer: Self-pay

## 2020-03-11 ENCOUNTER — Ambulatory Visit (INDEPENDENT_AMBULATORY_CARE_PROVIDER_SITE_OTHER): Payer: Medicare HMO

## 2020-03-11 DIAGNOSIS — E538 Deficiency of other specified B group vitamins: Secondary | ICD-10-CM | POA: Diagnosis not present

## 2020-03-11 MED ORDER — CYANOCOBALAMIN 1000 MCG/ML IJ SOLN
1000.0000 ug | Freq: Once | INTRAMUSCULAR | Status: AC
  Administered 2020-03-11: 1000 ug via INTRAMUSCULAR

## 2020-03-11 NOTE — Progress Notes (Addendum)
Patient presented for B 12 injection to left deltoid, patient voiced no concerns nor showed any signs of distress during injection.  Reviewed.  Dr Scott 

## 2020-03-12 ENCOUNTER — Other Ambulatory Visit: Payer: Self-pay | Admitting: Internal Medicine

## 2020-03-18 ENCOUNTER — Ambulatory Visit (INDEPENDENT_AMBULATORY_CARE_PROVIDER_SITE_OTHER): Payer: Medicare HMO

## 2020-03-18 ENCOUNTER — Other Ambulatory Visit: Payer: Self-pay

## 2020-03-18 DIAGNOSIS — E538 Deficiency of other specified B group vitamins: Secondary | ICD-10-CM

## 2020-03-18 MED ORDER — CYANOCOBALAMIN 1000 MCG/ML IJ SOLN
1000.0000 ug | Freq: Once | INTRAMUSCULAR | Status: AC
  Administered 2020-03-18: 1000 ug via INTRAMUSCULAR

## 2020-03-18 NOTE — Progress Notes (Addendum)
Patient presented for B 12 injection to left deltoid, patient voiced no concerns nor showed any signs of distress during injection.  Reviewed.  Dr Scott 

## 2020-04-06 ENCOUNTER — Ambulatory Visit (INDEPENDENT_AMBULATORY_CARE_PROVIDER_SITE_OTHER): Payer: Medicare HMO | Admitting: Internal Medicine

## 2020-04-06 ENCOUNTER — Other Ambulatory Visit: Payer: Self-pay

## 2020-04-06 DIAGNOSIS — G4733 Obstructive sleep apnea (adult) (pediatric): Secondary | ICD-10-CM | POA: Diagnosis not present

## 2020-04-06 DIAGNOSIS — E7800 Pure hypercholesterolemia, unspecified: Secondary | ICD-10-CM | POA: Diagnosis not present

## 2020-04-06 DIAGNOSIS — R200 Anesthesia of skin: Secondary | ICD-10-CM

## 2020-04-06 DIAGNOSIS — K219 Gastro-esophageal reflux disease without esophagitis: Secondary | ICD-10-CM

## 2020-04-06 DIAGNOSIS — R739 Hyperglycemia, unspecified: Secondary | ICD-10-CM | POA: Diagnosis not present

## 2020-04-06 DIAGNOSIS — F320 Major depressive disorder, single episode, mild: Secondary | ICD-10-CM

## 2020-04-06 DIAGNOSIS — Z9109 Other allergy status, other than to drugs and biological substances: Secondary | ICD-10-CM | POA: Diagnosis not present

## 2020-04-06 DIAGNOSIS — I10 Essential (primary) hypertension: Secondary | ICD-10-CM

## 2020-04-06 DIAGNOSIS — R202 Paresthesia of skin: Secondary | ICD-10-CM

## 2020-04-06 DIAGNOSIS — E559 Vitamin D deficiency, unspecified: Secondary | ICD-10-CM

## 2020-04-06 DIAGNOSIS — F32A Depression, unspecified: Secondary | ICD-10-CM

## 2020-04-06 NOTE — Progress Notes (Signed)
[name], female   DOB: 23-Jul-1945, 74 y.o.   MRN: [id number]   Subjective:    [name], female    DOB: [birth year], 74 y.o.   MRN: [id number]  HPI This visit occurred during the SARS-CoV-2 public health emergency.  Safety protocols were in place, including screening questions prior to the visit, additional usage of staff PPE, and extensive cleaning of exam room while observing appropriate contact time as indicated for disinfecting solutions.  Patient here for a scheduled follow up.  She reports noticing some increased cough recently. States occurs every year.  Feels is related to her allergies.  Noticed some drainage as well.  Started over this week - taking allergy medication.  Has helped some.  No chest pain or sob reported.  No abdominal pain or bowel change reported.  On nexium for acid reflux.  Feels if she watches her diet and takes nexium, controls.  Was having numbness in her feet.  Is scheduled to see Dr Manuella Ghazi next week.  Has noticed pain - ball of her right foot.  Worse at times.  No pain with palpation.     Past Medical History:  Diagnosis Date  . Allergy   . Depression   . History of chicken pox   . Hx: UTI (urinary tract infection)   . Hypertension    Past Surgical History:  Procedure Laterality Date  . COLONOSCOPY WITH PROPOFOL N/A 06/20/2016   Procedure: COLONOSCOPY WITH PROPOFOL;  Surgeon: Lucilla Lame, MD;  Location: [facility];  Service: Endoscopy;  Laterality: N/A;  . ESOPHAGOGASTRODUODENOSCOPY (EGD) WITH PROPOFOL N/A 06/20/2016   Procedure: ESOPHAGOGASTRODUODENOSCOPY (EGD) WITH PROPOFOL;  Surgeon: Lucilla Lame, MD;  Location: [facility] END[facility]COPY;  Service: Endoscopy;  Laterality: N/A;  . TONSILLECTOMY     as a child  . TUMOR REMOVAL     Family History  Problem Relation Age of Onset  . Hypertension Mother   . Cancer Sister        colon cancer  . Hypertension Sister   . Diabetes Sister   . Hypertension Brother   . Hypertension  Maternal Aunt   . Breast cancer Maternal Aunt 60  . Hypertension Maternal Uncle   . Stroke Maternal Grandmother   . Hypertension Maternal Grandmother    Social History   Socioeconomic History  . Marital status: Divorced    Spouse name: Not on file  . Number of children: Not on file  . Years of education: Not on file  . Highest education level: Not on file  Occupational History  . Not on file  Tobacco Use  . Smoking status: Never Smoker  . Smokeless tobacco: Never Used  Vaping Use  . Vaping Use: Never used  Substance and Sexual Activity  . Alcohol use: No    Alcohol/week: 0.0 standard drinks  . Drug use: No  . Sexual activity: Not Currently  Other Topics Concern  . Not on file  Social History Narrative  . Not on file   Social Determinants of Health   Financial Resource Strain: Low Risk   . Difficulty of Paying Living Expenses: Not hard at all  Food Insecurity: No Food Insecurity  . Worried About Charity fundraiser in the Last Year: Never true  . Ran Out of Food in the Last Year: Never true  Transportation Needs: No Transportation Needs  . Lack of Transportation (Medical): No  . Lack of Transportation (Non-Medical): No  Physical Activity: Insufficiently Active  . Days  of Exercise per Week: 1 day  . Minutes of Exercise per Session: 20 min  Stress: No Stress Concern Present  . Feeling of Stress : Not at all  Social Connections: Unknown  . Frequency of Communication with Friends and Family: More than three times a week  . Frequency of Social Gatherings with Friends and Family: More than three times a week  . Attends Religious Services: Not on file  . Active Member of Clubs or Organizations: Not on file  . Attends Archivist Meetings: Not on file  . Marital Status: Not on file    Outpatient Encounter Medications as of 04/06/2020  Medication Sig  . cholecalciferol (VITAMIN D3) 25 MCG (1000 UNIT) tablet Take by mouth daily. 2,000 units daily, taking a gummy   . conjugated estrogens (PREMARIN) vaginal cream Place 1 Applicatorful vaginally daily. Use pea sized amount M-W-Fr before bedtime  . Cyanocobalamin (VITAMIN B-12 IJ) Inject as directed. Weekly for 3 weeks and then monthly  . esomeprazole (NEXIUM) 40 MG capsule TAKE 1 CAPSULE (40 MG TOTAL) BY MOUTH DAILY AT 12 NOON.  Marland Kitchen lisinopril-hydrochlorothiazide (ZESTORETIC) 10-12.5 MG tablet TAKE 1 TABLET EVERY DAY  . rosuvastatin (CRESTOR) 10 MG tablet TAKE 1 TABLET EVERY DAY  . sertraline (ZOLOFT) 25 MG tablet TAKE 1 TABLET EVERY DAY   No facility-administered encounter medications on file as of 04/06/2020.    Review of Systems  Constitutional: Negative for appetite change and unexpected weight change.  HENT: Positive for congestion and postnasal drip.   Respiratory: Negative for chest tightness and shortness of breath.        No significant cough.   Cardiovascular: Negative for chest pain, palpitations and leg swelling.  Gastrointestinal: Negative for abdominal pain, diarrhea, nausea and vomiting.  Genitourinary: Negative for difficulty urinating and dysuria.  Musculoskeletal: Negative for joint swelling and myalgias.  Skin: Negative for color change and rash.  Neurological: Negative for dizziness and headaches.       Numbness/tingling feet as outlined.    Psychiatric/Behavioral: Negative for agitation and dysphoric mood.       Objective:    Physical Exam Vitals reviewed.  Constitutional:      General: She is not in acute distress.    Appearance: Normal appearance.  HENT:     Head: Normocephalic and atraumatic.     Right Ear: External ear normal.     Left Ear: External ear normal.     Mouth/Throat:     Mouth: Oropharynx is clear and moist.  Eyes:     General: No scleral icterus.       Right eye: No discharge.        Left eye: No discharge.     Conjunctiva/sclera: Conjunctivae normal.  Neck:     Thyroid: No thyromegaly.  Cardiovascular:     Rate and Rhythm: Normal rate and  regular rhythm.  Pulmonary:     Effort: No respiratory distress.     Breath sounds: Normal breath sounds. No wheezing.  Abdominal:     General: Bowel sounds are normal.     Palpations: Abdomen is soft.     Tenderness: There is no abdominal tenderness.  Musculoskeletal:        General: No swelling, tenderness or edema.     Cervical back: Neck supple. No tenderness.  Lymphadenopathy:     Cervical: No cervical adenopathy.  Skin:    Findings: No erythema or rash.  Neurological:     Mental Status: She is alert.  Psychiatric:  Mood and Affect: Mood normal.        Behavior: Behavior normal.     BP 128/70   Pulse 90   Temp 97.9 F (36.6 C) (Oral)   Resp 16   Ht '5\' 7"'  (1.702 m)   Wt 225 lb 12.8 oz (102.4 kg)   SpO2 98%   BMI 35.37 kg/m  Wt Readings from Last 3 Encounters:  04/06/20 225 lb 12.8 oz (102.4 kg)  02/20/20 220 lb (99.8 kg)  01/29/20 222 lb (100.7 kg)     Lab Results  Component Value Date   WBC 7.3 02/20/2020   HGB 12.3 02/20/2020   HCT 36.6 02/20/2020   PLT 250.0 02/20/2020   GLUCOSE 93 02/20/2020   CHOL 203 (H) 08/21/2019   TRIG 115.0 08/21/2019   HDL 46.50 08/21/2019   LDLCALC 133 (H) 08/21/2019   ALT 13 02/20/2020   AST 16 02/20/2020   NA 138 02/20/2020   K 4.0 02/20/2020   CL 103 02/20/2020   CREATININE 1.03 02/20/2020   BUN 18 02/20/2020   CO2 29 02/20/2020   TSH 2.46 02/20/2020   HGBA1C 6.4 02/20/2020    MM 3D SCREEN BREAST BILATERAL  Result Date: 10/02/2019 CLINICAL DATA:  Screening. EXAM: DIGITAL SCREENING BILATERAL MAMMOGRAM WITH TOMO AND CAD COMPARISON:  Previous exam(s). ACR Breast Density Category c: The breast tissue is heterogeneously dense, which may obscure small masses. FINDINGS: There are no findings suspicious for malignancy. Images were processed with CAD. IMPRESSION: No mammographic evidence of malignancy. A result letter of this screening mammogram will be mailed directly to the patient. RECOMMENDATION: Screening mammogram  in one year. (Code:SM-B-01Y) BI-RADS CATEGORY  1: Negative. Electronically Signed   By: Audie Pinto M.D.   On: 10/02/2019 11:49       Assessment & Plan:   Problem List Items Addressed This Visit    Vitamin D deficiency    Follow vitamin D level.       OSA (obstructive sleep apnea)    Saw pulmonary.  Note reviewed.  Wanted to work on weight loss.  Recommended f/u in 6 months.  Last evaluated 03/02/20.        Numbness and tingling of both feet    Recent B12 level low.  Continue injections.  Is scheduled for neurology evaluation next week - [facility].        Mild depression ([place])    On zoloft.  Feels things are stable.  Follow.       Hyperglycemia    Low carb diet and exercise.  Follow met b and a1c.       Relevant Orders   Hemoglobin A1c   Hypercholesteremia    On crestor.  Low cholesterol diet and exercise.  Follow lipid panel and liver function tests.        Relevant Orders   Hepatic function panel   Lipid panel   GERD (gastroesophageal reflux disease)    On nexium.  No upper symptoms if watches diet and takes nexium.  Follow.       Essential hypertension, benign    Blood pressure doing well on lisinopril/hctz.  Follow pressures.  Follow metabolic panel.       Relevant Orders   Basic metabolic panel   Environmental allergies    Congestion as outlined.  Started allergy medication. Improved.  Follow.            Einar Pheasant, MD

## 2020-04-06 NOTE — Patient Instructions (Addendum)
Saline nasal spray - flush nose twice a day  nasacort nasal spray - 2 sprays each nostril one time per day.  Do this in the evening.

## 2020-04-14 DIAGNOSIS — E531 Pyridoxine deficiency: Secondary | ICD-10-CM | POA: Diagnosis not present

## 2020-04-14 DIAGNOSIS — R202 Paresthesia of skin: Secondary | ICD-10-CM | POA: Diagnosis not present

## 2020-04-14 DIAGNOSIS — E559 Vitamin D deficiency, unspecified: Secondary | ICD-10-CM | POA: Diagnosis not present

## 2020-04-14 DIAGNOSIS — E519 Thiamine deficiency, unspecified: Secondary | ICD-10-CM | POA: Diagnosis not present

## 2020-04-14 DIAGNOSIS — R200 Anesthesia of skin: Secondary | ICD-10-CM | POA: Diagnosis not present

## 2020-04-14 DIAGNOSIS — E538 Deficiency of other specified B group vitamins: Secondary | ICD-10-CM | POA: Diagnosis not present

## 2020-04-18 ENCOUNTER — Encounter: Payer: Self-pay | Admitting: Internal Medicine

## 2020-04-18 DIAGNOSIS — G4733 Obstructive sleep apnea (adult) (pediatric): Secondary | ICD-10-CM | POA: Insufficient documentation

## 2020-04-18 NOTE — Assessment & Plan Note (Signed)
On zoloft.  Feels things are stable.  Follow.  

## 2020-04-18 NOTE — Assessment & Plan Note (Signed)
Saw pulmonary.  Note reviewed.  Wanted to work on weight loss.  Recommended f/u in 6 months.  Last evaluated 03/02/20.

## 2020-04-18 NOTE — Assessment & Plan Note (Signed)
Follow vitamin D level.  

## 2020-04-18 NOTE — Assessment & Plan Note (Signed)
Blood pressure doing well on lisinopril/hctz.  Follow pressures.  Follow metabolic panel.

## 2020-04-18 NOTE — Assessment & Plan Note (Signed)
Congestion as outlined.  Started allergy medication. Improved.  Follow.

## 2020-04-18 NOTE — Assessment & Plan Note (Signed)
On crestor.  Low cholesterol diet and exercise.  Follow lipid panel and liver function tests.   

## 2020-04-18 NOTE — Assessment & Plan Note (Signed)
Low carb diet and exercise.  Follow met b and a1c.  

## 2020-04-18 NOTE — Assessment & Plan Note (Signed)
Recent B12 level low.  Continue injections.  Is scheduled for neurology evaluation next week - [facility].

## 2020-04-18 NOTE — Assessment & Plan Note (Signed)
On nexium.  No upper symptoms if watches diet and takes nexium.  Follow.

## 2020-04-20 ENCOUNTER — Other Ambulatory Visit: Payer: Self-pay

## 2020-04-20 ENCOUNTER — Ambulatory Visit (INDEPENDENT_AMBULATORY_CARE_PROVIDER_SITE_OTHER): Payer: Medicare HMO

## 2020-04-20 DIAGNOSIS — E538 Deficiency of other specified B group vitamins: Secondary | ICD-10-CM

## 2020-04-20 MED ORDER — CYANOCOBALAMIN 1000 MCG/ML IJ SOLN
1000.0000 ug | Freq: Once | INTRAMUSCULAR | Status: AC
  Administered 2020-04-20: 14:00:00 1000 ug via INTRAMUSCULAR

## 2020-04-20 NOTE — Progress Notes (Signed)
Patient presented for B 12 injection to left deltoid, patient voiced no concerns nor showed any signs of distress during injection. 

## 2020-04-28 DIAGNOSIS — R200 Anesthesia of skin: Secondary | ICD-10-CM | POA: Diagnosis not present

## 2020-04-28 DIAGNOSIS — R202 Paresthesia of skin: Secondary | ICD-10-CM | POA: Diagnosis not present

## 2020-05-06 ENCOUNTER — Other Ambulatory Visit: Payer: Self-pay | Admitting: Internal Medicine

## 2020-05-14 ENCOUNTER — Ambulatory Visit (INDEPENDENT_AMBULATORY_CARE_PROVIDER_SITE_OTHER): Payer: Medicare HMO

## 2020-05-14 VITALS — Ht 67.0 in | Wt 225.0 lb

## 2020-05-14 DIAGNOSIS — Z0000 Encounter for general adult medical examination without abnormal findings: Secondary | ICD-10-CM

## 2020-05-14 NOTE — Patient Instructions (Addendum)
Susan Miller , Thank you for taking time to come for your Medicare Wellness Visit. I appreciate your ongoing commitment to your health goals. Please review the following plan we discussed and let me know if I can assist you in the future.   These are the goals we discussed: Goals    . DIET - INCREASE WATER INTAKE     Stay hydrated       This is a list of the screening recommended for you and due dates:  Health Maintenance  Topic Date Due  . Tetanus Vaccine  Never done  . COVID-19 Vaccine (3 - Booster for Pfizer series) 01/14/2020  . Mammogram  10/01/2020  . Colon Cancer Screening  06/20/2026  . Flu Shot  Completed  . DEXA scan (bone density measurement)  Completed  .  Hepatitis C: One time screening is recommended by Center for Disease Control  (CDC) for  adults born from [birth year] through 1965.   Completed  . Pneumonia vaccines  Completed   Immunizations Immunization History  Administered Date(s) Administered  . Fluad Quad(high Dose 65+) 01/31/2019, 01/29/2020  . Influenza, High Dose Seasonal PF 01/11/2016, 04/27/2017, 01/18/2018  . Influenza,inj,Quad PF,6+ Mos 02/24/2013, 02/27/2014, 02/22/2015  . PFIZER SARS-COV-2 Vaccination 06/23/2019, 07/14/2019  . Pneumococcal Conjugate-13 11/06/2016  . Pneumococcal Polysaccharide-23 12/06/2017   Keep all routine maintenance appointments.   Next scheduled lab 05/27/20 @ 8:45 fasting lab and B12 injection.  Follow up 06/01/20 @ 9:30.  Advanced directives: not yet completed  Conditions/risks identified: none new  Follow up in one year for your annual wellness visit.   Preventive Care 66 Years and Older, Female Preventive care refers to lifestyle choices and visits with your health care provider that can promote health and wellness. What does preventive care include?  A yearly physical exam. This is also called an annual well check.  Dental exams once or twice a year.  Routine eye exams. Ask your health care provider how often you  should have your eyes checked.  Personal lifestyle choices, including:  Daily care of your teeth and gums.  Regular physical activity.  Eating a healthy diet.  Avoiding tobacco and drug use.  Limiting alcohol use.  Practicing safe sex.  Taking low-dose aspirin every day.  Taking vitamin and mineral supplements as recommended by your health care provider. What happens during an annual well check? The services and screenings done by your health care provider during your annual well check will depend on your age, overall health, lifestyle risk factors, and family history of disease. Counseling  Your health care provider may ask you questions about your:  Alcohol use.  Tobacco use.  Drug use.  Emotional well-being.  Home and relationship well-being.  Sexual activity.  Eating habits.  History of falls.  Memory and ability to understand (cognition).  Work and work Astronomer.  Reproductive health. Screening  You may have the following tests or measurements:  Height, weight, and BMI.  Blood pressure.  Lipid and cholesterol levels. These may be checked every 5 years, or more frequently if you are over 60 years old.  Skin check.  Lung cancer screening. You may have this screening every year starting at age 67 if you have a 30-pack-year history of smoking and currently smoke or have quit within the past 15 years.  Fecal occult blood test (FOBT) of the stool. You may have this test every year starting at age 41.  Flexible sigmoidoscopy or colonoscopy. You may have a sigmoidoscopy every 5 years  or a colonoscopy every 10 years starting at age 54.  Hepatitis C blood test.  Hepatitis B blood test.  Sexually transmitted disease (STD) testing.  Diabetes screening. This is done by checking your blood sugar (glucose) after you have not eaten for a while (fasting). You may have this done every 1-3 years.  Bone density scan. This is done to screen for osteoporosis.  You may have this done starting at age 48.  Mammogram. This may be done every 1-2 years. Talk to your health care provider about how often you should have regular mammograms. Talk with your health care provider about your test results, treatment options, and if necessary, the need for more tests. Vaccines  Your health care provider may recommend certain vaccines, such as:  Influenza vaccine. This is recommended every year.  Tetanus, diphtheria, and acellular pertussis (Tdap, Td) vaccine. You may need a Td booster every 10 years.  Zoster vaccine. You may need this after age 58.  Pneumococcal 13-valent conjugate (PCV13) vaccine. One dose is recommended after age 59.  Pneumococcal polysaccharide (PPSV23) vaccine. One dose is recommended after age 47. Talk to your health care provider about which screenings and vaccines you need and how often you need them. This information is not intended to replace advice given to you by your health care provider. Make sure you discuss any questions you have with your health care provider. Document Released: 05/21/2015 Document Revised: 01/12/2016 Document Reviewed: 02/23/2015 Elsevier Interactive Patient Education  2017 [employer] Prevention in the Home Falls can cause injuries. They can happen to people of all ages. There are many things you can do to make your home safe and to help prevent falls. What can I do on the outside of my home?  Regularly fix the edges of walkways and driveways and fix any cracks.  Remove anything that might make you trip as you walk through a door, such as a raised step or threshold.  Trim any bushes or trees on the path to your home.  Use bright outdoor lighting.  Clear any walking paths of anything that might make someone trip, such as rocks or tools.  Regularly check to see if handrails are loose or broken. Make sure that both sides of any steps have handrails.  Any raised decks and porches should have  guardrails on the edges.  Have any leaves, snow, or ice cleared regularly.  Use sand or salt on walking paths during winter.  Clean up any spills in your garage right away. This includes oil or grease spills. What can I do in the bathroom?  Use night lights.  Install grab bars by the toilet and in the tub and shower. Do not use towel bars as grab bars.  Use non-skid mats or decals in the tub or shower.  If you need to sit down in the shower, use a plastic, non-slip stool.  Keep the floor dry. Clean up any water that spills on the floor as soon as it happens.  Remove soap buildup in the tub or shower regularly.  Attach bath mats securely with double-sided non-slip rug tape.  Do not have throw rugs and other things on the floor that can make you trip. What can I do in the bedroom?  Use night lights.  Make sure that you have a light by your bed that is easy to reach.  Do not use any sheets or blankets that are too big for your bed. They should not hang down  onto the floor.  Have a firm chair that has side arms. You can use this for support while you get dressed.  Do not have throw rugs and other things on the floor that can make you trip. What can I do in the kitchen?  Clean up any spills right away.  Avoid walking on wet floors.  Keep items that you use a lot in easy-to-reach places.  If you need to reach something above you, use a strong step stool that has a grab bar.  Keep electrical cords out of the way.  Do not use floor polish or wax that makes floors slippery. If you must use wax, use non-skid floor wax.  Do not have throw rugs and other things on the floor that can make you trip. What can I do with my stairs?  Do not leave any items on the stairs.  Make sure that there are handrails on both sides of the stairs and use them. Fix handrails that are broken or loose. Make sure that handrails are as long as the stairways.  Check any carpeting to make sure that  it is firmly attached to the stairs. Fix any carpet that is loose or worn.  Avoid having throw rugs at the top or bottom of the stairs. If you do have throw rugs, attach them to the floor with carpet tape.  Make sure that you have a light switch at the top of the stairs and the bottom of the stairs. If you do not have them, ask someone to add them for you. What else can I do to help prevent falls?  Wear shoes that:  Do not have high heels.  Have rubber bottoms.  Are comfortable and fit you well.  Are closed at the toe. Do not wear sandals.  If you use a stepladder:  Make sure that it is fully opened. Do not climb a closed stepladder.  Make sure that both sides of the stepladder are locked into place.  Ask someone to hold it for you, if possible.  Clearly mark and make sure that you can see:  Any grab bars or handrails.  First and last steps.  Where the edge of each step is.  Use tools that help you move around (mobility aids) if they are needed. These include:  Canes.  Walkers.  Scooters.  Crutches.  Turn on the lights when you go into a dark area. Replace any light bulbs as soon as they burn out.  Set up your furniture so you have a clear path. Avoid moving your furniture around.  If any of your floors are uneven, fix them.  If there are any pets around you, be aware of where they are.  Review your medicines with your doctor. Some medicines can make you feel dizzy. This can increase your chance of falling. Ask your doctor what other things that you can do to help prevent falls. This information is not intended to replace advice given to you by your health care provider. Make sure you discuss any questions you have with your health care provider. Document Released: 02/18/2009 Document Revised: 09/30/2015 Document Reviewed: 05/29/2014 Elsevier Interactive Patient Education  2017 [employer].

## 2020-05-14 NOTE — Progress Notes (Signed)
Subjective:   Susan Miller is a 75 y.o. female who presents for Medicare Annual (Subsequent) preventive examination.  Review of Systems    No ROS.  Medicare Wellness Virtual Visit.   Cardiac Risk Factors include: advanced age (>14men, >29 women);hypertension     Objective:    Today's Vitals   05/14/20 1333  Weight: 225 lb (102.1 kg)  Height: 5\' 7"  (1.702 m)   Body mass index is 35.24 kg/m.  Advanced Directives 05/14/2020 06/12/2019 05/14/2019 01/08/2019 11/09/2018 12/06/2017 12/07/2016  Does Patient Have a Medical Advance Directive? No No No No No No No  Does patient want to make changes to medical advance directive? - - - No - Patient declined - No - Patient declined -  Would patient like information on creating a medical advance directive? No - Patient declined No - Patient declined No - Patient declined - - - Yes (MAU/Ambulatory/Procedural Areas - Information given)    Current Medications (verified) Outpatient Encounter Medications as of 05/14/2020  Medication Sig  . cholecalciferol (VITAMIN D3) 25 MCG (1000 UNIT) tablet Take by mouth daily. 2,000 units daily, taking a gummy  . conjugated estrogens (PREMARIN) vaginal cream Place 1 Applicatorful vaginally daily. Use pea sized amount M-W-Fr before bedtime  . Cyanocobalamin (VITAMIN B-12 IJ) Inject as directed. Weekly for 3 weeks and then monthly  . esomeprazole (NEXIUM) 40 MG capsule TAKE 1 CAPSULE (40 MG TOTAL) BY MOUTH DAILY AT 12 NOON.  07/12/2020 lisinopril-hydrochlorothiazide (ZESTORETIC) 10-12.5 MG tablet TAKE 1 TABLET EVERY DAY  . rosuvastatin (CRESTOR) 10 MG tablet TAKE 1 TABLET EVERY DAY  . sertraline (ZOLOFT) 25 MG tablet TAKE 1 TABLET EVERY DAY   No facility-administered encounter medications on file as of 05/14/2020.    Allergies (verified) Codeine, Iodinated diagnostic agents, and Nitrofurantoin monohyd macro   History: Past Medical History:  Diagnosis Date  . Allergy   . Depression   . History of chicken pox   . Hx: UTI  (urinary tract infection)   . Hypertension    Past Surgical History:  Procedure Laterality Date  . COLONOSCOPY WITH PROPOFOL N/A 06/20/2016   Procedure: COLONOSCOPY WITH PROPOFOL;  Surgeon: 06/22/2016, MD;  Location: [facility] ENDOSCOPY;  Service: Endoscopy;  Laterality: N/A;  . ESOPHAGOGASTRODUODENOSCOPY (EGD) WITH PROPOFOL N/A 06/20/2016   Procedure: ESOPHAGOGASTRODUODENOSCOPY (EGD) WITH PROPOFOL;  Surgeon: 06/22/2016, MD;  Location: [facility] ENDOSCOPY;  Service: Endoscopy;  Laterality: N/A;  . TONSILLECTOMY     as a child  . TUMOR REMOVAL     Family History  Problem Relation Age of Onset  . Hypertension Mother   . Cancer Sister        colon cancer  . Hypertension Sister   . Diabetes Sister   . Hypertension Brother   . Hypertension Maternal Aunt   . Breast cancer Maternal Aunt 60  . Hypertension Maternal Uncle   . Stroke Maternal Grandmother   . Hypertension Maternal Grandmother    Social History   Socioeconomic History  . Marital status: Divorced    Spouse name: Not on file  . Number of children: Not on file  . Years of education: Not on file  . Highest education level: Not on file  Occupational History  . Not on file  Tobacco Use  . Smoking status: Never Smoker  . Smokeless tobacco: Never Used  Vaping Use  . Vaping Use: Never used  Substance and Sexual Activity  . Alcohol use: No    Alcohol/week: 0.0 standard drinks  . Drug  use: No  . Sexual activity: Not Currently  Other Topics Concern  . Not on file  Social History Narrative  . Not on file   Social Determinants of Health   Financial Resource Strain: Low Risk   . Difficulty of Paying Living Expenses: Not hard at all  Food Insecurity: No Food Insecurity  . Worried About Charity fundraiser in the Last Year: Never true  . Ran Out of Food in the Last Year: Never true  Transportation Needs: No Transportation Needs  . Lack of Transportation (Medical): No  . Lack of Transportation (Non-Medical): No  Physical  Activity: Unknown  . Days of Exercise per Week: 0 days  . Minutes of Exercise per Session: Not on file  Stress: No Stress Concern Present  . Feeling of Stress : Not at all  Social Connections: Unknown  . Frequency of Communication with Friends and Family: More than three times a week  . Frequency of Social Gatherings with Friends and Family: More than three times a week  . Attends Religious Services: Not on file  . Active Member of Clubs or Organizations: Not on file  . Attends Archivist Meetings: Not on file  . Marital Status: Not on file    Tobacco Counseling Counseling given: Not Answered   Clinical Intake:  Pre-visit preparation completed: Yes        Diabetes: No  How often do you need to have someone help you when you read instructions, pamphlets, or other written materials from your doctor or pharmacy?: 1 - Never   Interpreter Needed?: No      Activities of Daily Living In your present state of health, do you have any difficulty performing the following activities: 05/14/2020  Hearing? N  Vision? N  Difficulty concentrating or making decisions? N  Walking or climbing stairs? Y  Comment Paces self with walking  Dressing or bathing? N  Doing errands, shopping? N  Preparing Food and eating ? N  Using the Toilet? N  In the past six months, have you accidently leaked urine? N  Do you have problems with loss of bowel control? N  Managing your Medications? N  Managing your Finances? N  Housekeeping or managing your Housekeeping? N  Some recent data might be hidden    Patient Care Team: Einar Pheasant, MD as PCP - General (Internal Medicine) Kate Sable, MD as PCP - Cardiology (Cardiology)  Indicate any recent Medical Services you may have received from other than [facility] providers in the past year (date may be approximate).     Assessment:   This is a routine wellness examination for [name].  I connected with Susan Miller today by telephone and  verified that I am speaking with the correct person using two identifiers. Location patient: home Location provider: work Persons participating in the virtual visit: patient, Marine scientist.    I discussed the limitations, risks, security and privacy concerns of performing an evaluation and management service by telephone and the availability of in person appointments. The patient expressed understanding and verbally consented to this telephonic visit.    Interactive audio and video telecommunications were attempted between this provider and patient, however failed, due to patient having technical difficulties OR patient did not have access to video capability.  We continued and completed visit with audio only.  Some vital signs may be absent or patient reported.   Hearing/Vision screen  Hearing Screening   125Hz  250Hz  500Hz  1000Hz  2000Hz  3000Hz  4000Hz  6000Hz  8000Hz   Right ear:  Left ear:           Comments: Patient is able to hear conversational tones without difficulty. No issues reported.  Vision Screening Comments: Wears corrective lenses  Visual acuity not assessed per patient preference since they have regular follow up with the ophthalmologist.  Dietary issues and exercise activities discussed: Current Exercise Habits: The patient does not participate in regular exercise at present  Healthy diet Good water intake  Goals    . DIET - INCREASE WATER INTAKE     Stay hydrated      Depression Screen PHQ 2/9 Scores 05/14/2020 10/09/2019 08/21/2019 05/14/2019 04/08/2019 12/24/2018 12/06/2017  PHQ - 2 Score 0 0 0 3 3 5 2   PHQ- 9 Score - 0 - 6 13 12 5     Fall Risk Fall Risk  05/14/2020 08/21/2019 05/14/2019 12/06/2017 12/07/2016  Falls in the past year? 0 0 1 No No  Number falls in past yr: 0 0 0 - -  Injury with Fall? 0 - 0 - -  Comment - - She tripped over a bag outside that she did not see. - -  Follow up Falls evaluation completed Falls evaluation completed Falls evaluation completed;Falls  prevention discussed - -    FALL RISK PREVENTION PERTAINING TO THE HOME: Handrails in use when climbing stairs? Yes Home free of loose throw rugs in walkways, pet beds, electrical cords, etc? Yes  Adequate lighting in your home to reduce risk of falls? Yes   ASSISTIVE DEVICES UTILIZED TO PREVENT FALLS: Life alert? No  Use of a cane, walker or w/c? No  Grab bars in the bathroom? Yes  Shower chair or bench in shower? Yes  Elevated toilet seat or a handicapped toilet? Yes   TIMED UP AND GO: Was the test performed? No . Virtual visit.   Cognitive Function: MMSE - Mini Mental State Exam 12/06/2017 12/07/2016  Orientation to time 5 5  Orientation to Place 5 5  Registration 3 3  Attention/ Calculation 5 5  Recall 3 3  Language- name 2 objects 2 2  Language- repeat 1 1  Language- follow 3 step command 3 3  Language- read & follow direction 1 1  Write a sentence 1 1  Copy design 1 1  Total score 30 30     6CIT Screen 05/14/2020 05/14/2019  What Year? 0 points 0 points  What month? 0 points 0 points  What time? 0 points 0 points  Count back from 20 0 points 0 points  Months in reverse 0 points 0 points  Repeat phrase 0 points 0 points  Total Score 0 0    Immunizations Immunization History  Administered Date(s) Administered  . Fluad Quad(high Dose 65+) 01/31/2019, 01/29/2020  . Influenza, High Dose Seasonal PF 01/11/2016, 04/27/2017, 01/18/2018  . Influenza,inj,Quad PF,6+ Mos 02/24/2013, 02/27/2014, 02/22/2015  . PFIZER SARS-COV-2 Vaccination 06/23/2019, 07/14/2019  . Pneumococcal Conjugate-13 11/06/2016  . Pneumococcal Polysaccharide-23 12/06/2017   TDAP status: Due, Education has been provided regarding the importance of this vaccine. Advised may receive this vaccine at local pharmacy or Health Dept. Aware to provide a copy of the vaccination record if obtained from local pharmacy or Health Dept. Verbalized acceptance and understanding. Deferred.   Covid Booster- not yet  completed.  Health Maintenance Health Maintenance  Topic Date Due  . TETANUS/TDAP  Never done  . COVID-19 Vaccine (3 - Booster for Pfizer series) 01/14/2020  . MAMMOGRAM  10/01/2020  . COLONOSCOPY (Pts 45-66yrs Insurance coverage will need to  be confirmed)  06/20/2026  . INFLUENZA VACCINE  Completed  . DEXA SCAN  Completed  . Hepatitis C Screening  Completed  . PNA vac Low Risk Adult  Completed   Colorectal cancer screening: Type of screening: Colonoscopy. Completed 06/20/16. Repeat every 10 years.  Mammogram status: Completed 10/02/19. Repeat every year.MM 3D SCREEN BREAST BILATERAL.  Bone Density status: Completed 02/01/17. Results reflect: Bone density results: OSTEOPENIA. Repeat every 2-5 years. cholecalciferol (VITAMIN D3) 25 MCG (1000 UNIT) tablet.  Lung Cancer Screening: (Low Dose CT Chest recommended if Age 52-80 years, 30 pack-year currently smoking OR have quit w/in 15years.) does not qualify.   Hepatitis C Screening: Completed 04/18/16.  Vision Screening: Recommended annual ophthalmology exams for early detection of glaucoma and other disorders of the eye. Is the patient up to date with their annual eye exam?  Yes  Who is the provider or what is the name of the office in which the patient attends annual eye exams? [facility].   Dental Screening: Recommended annual dental exams for proper oral hygiene. Visits every 6-12 months. Dr. Beverely Pace.  Community Resource Referral / Chronic Care Management: CRR required this visit?  No   CCM required this visit?  No      Plan:   Keep all routine maintenance appointments.   Next scheduled lab 05/27/20 @ 8:45 fasting lab and B12 injection  Follow up 06/01/20 @ 9:30   I have personally reviewed and noted the following in the patient's chart:   . Medical and social history . Use of alcohol, tobacco or illicit drugs  . Current medications and supplements . Functional ability and status . Nutritional status . Physical  activity . Advanced directives . List of other physicians . Hospitalizations, surgeries, and ER visits in previous 12 months . Vitals . Screenings to include cognitive, depression, and falls . Referrals and appointments  In addition, I have reviewed and discussed with patient certain preventive protocols, quality metrics, and best practice recommendations. A written personalized care plan for preventive services as well as general preventive health recommendations were provided to patient via mychart.     Ashok Pall, LPN   0/01/3234

## 2020-05-18 ENCOUNTER — Encounter: Payer: Self-pay | Admitting: Internal Medicine

## 2020-05-18 ENCOUNTER — Other Ambulatory Visit (INDEPENDENT_AMBULATORY_CARE_PROVIDER_SITE_OTHER): Payer: Medicare HMO

## 2020-05-18 ENCOUNTER — Telehealth (INDEPENDENT_AMBULATORY_CARE_PROVIDER_SITE_OTHER): Payer: Medicare HMO | Admitting: Internal Medicine

## 2020-05-18 ENCOUNTER — Other Ambulatory Visit: Payer: Self-pay

## 2020-05-18 VITALS — Ht 67.0 in | Wt 225.0 lb

## 2020-05-18 DIAGNOSIS — R300 Dysuria: Secondary | ICD-10-CM

## 2020-05-18 DIAGNOSIS — R059 Cough, unspecified: Secondary | ICD-10-CM | POA: Diagnosis not present

## 2020-05-18 LAB — URINALYSIS, ROUTINE W REFLEX MICROSCOPIC
Bilirubin Urine: NEGATIVE
Hgb urine dipstick: NEGATIVE
Ketones, ur: NEGATIVE
Nitrite: NEGATIVE
RBC / HPF: NONE SEEN (ref 0–?)
Specific Gravity, Urine: 1.015 (ref 1.000–1.030)
Total Protein, Urine: NEGATIVE
Urine Glucose: NEGATIVE
Urobilinogen, UA: 0.2 (ref 0.0–1.0)
pH: 7 (ref 5.0–8.0)

## 2020-05-18 MED ORDER — CEFDINIR 250 MG/5ML PO SUSR: ORAL | 0 refills | Status: DC

## 2020-05-18 NOTE — Progress Notes (Signed)
[name], female   DOB: [birth year], 75 y.o.   MRN: [id number]   Virtual Visit via video Note  This visit type was conducted due to national recommendations for restrictions regarding the COVID-19 pandemic (e.g. social distancing).  This format is felt to be most appropriate for this patient at this time.  All issues noted in this document were discussed and addressed.  No physical exam was performed (except for noted visual exam findings with Video Visits).   I connected with Susan Miller by a video enabled telemedicine application and verified that I am speaking with the correct person using two identifiers. Location patient: home Location provider: work Persons participating in the virtual visit: patient, provider  The limitations, risks, security and privacy concerns of performing an evaluation and management service by video and the availability of in person appointments have been discussed.  It has also been discussed with the patient that there may be a patient responsible charge related to this service. The patient expressed understanding and agreed to proceed.   Reason for visit: work in appt  HPI: Work in appt with concerns regarding possible UTI and increased cough and congestion. States urinary symptoms started 3-4 days ago. Noticed burning with urination.  Feels similar to previous UTI.  No hematuria.  No vomiting or abdominal pain reported.  Also reported that her sinuses are stopped up more this am.  has had some nasal congestion and stuffy head.  Feels more puffy now.  Phlegm in throat.  Cough.  Coughing makes her throat irritated.  No chest congestion or sob.  No chest pain reported.  Cough and throat congestion present over the last 7-10 days.  Using honey cough syrup and robitussin.  Discussed nasacort.  Increased sinus pressure.  Discussed possible covid.  No known exposures.      ROS: See pertinent positives and negatives per HPI.  Past Medical History:   Diagnosis Date  . Allergy   . Depression   . History of chicken pox   . Hx: UTI (urinary tract infection)   . Hypertension     Past Surgical History:  Procedure Laterality Date  . COLONOSCOPY WITH PROPOFOL N/A 06/20/2016   Procedure: COLONOSCOPY WITH PROPOFOL;  Surgeon: Midge Minium, MD;  Location: [facility];  Service: Endoscopy;  Laterality: N/A;  . ESOPHAGOGASTRODUODENOSCOPY (EGD) WITH PROPOFOL N/A 06/20/2016   Procedure: ESOPHAGOGASTRODUODENOSCOPY (EGD) WITH PROPOFOL;  Surgeon: Midge Minium, MD;  Location: [facility] ENDOSCOPY;  Service: Endoscopy;  Laterality: N/A;  . TONSILLECTOMY     as a child  . TUMOR REMOVAL      Family History  Problem Relation Age of Onset  . Hypertension Mother   . Cancer Sister        colon cancer  . Hypertension Sister   . Diabetes Sister   . Hypertension Brother   . Hypertension Maternal Aunt   . Breast cancer Maternal Aunt 60  . Hypertension Maternal Uncle   . Stroke Maternal Grandmother   . Hypertension Maternal Grandmother     SOCIAL HX: reviewed.    Current Outpatient Medications:  .  cefdinir (OMNICEF) 250 MG/5ML suspension, Take 300mg  bid (6cc's bid) for 5 days., Disp: 60 mL, Rfl: 0 .  cholecalciferol (VITAMIN D3) 25 MCG (1000 UNIT) tablet, Take by mouth daily. 2,000 units daily, taking a gummy, Disp: , Rfl:  .  conjugated estrogens (PREMARIN) vaginal cream, Place 1 Applicatorful vaginally daily. Use pea sized amount M-W-Fr before bedtime, Disp: 42.5 g, Rfl: 12 .  Cyanocobalamin (VITAMIN B-12 IJ), Inject as directed. Weekly for 3 weeks and then monthly, Disp: , Rfl:  .  esomeprazole (NEXIUM) 40 MG capsule, TAKE 1 CAPSULE (40 MG TOTAL) BY MOUTH DAILY AT 12 NOON., Disp: 90 capsule, Rfl: 0 .  lisinopril-hydrochlorothiazide (ZESTORETIC) 10-12.5 MG tablet, TAKE 1 TABLET EVERY DAY, Disp: 90 tablet, Rfl: 1 .  rosuvastatin (CRESTOR) 10 MG tablet, TAKE 1 TABLET EVERY DAY, Disp: 90 tablet, Rfl: 0 .  sertraline (ZOLOFT) 25 MG tablet, TAKE 1 TABLET  EVERY DAY, Disp: 90 tablet, Rfl: 1  EXAM:  GENERAL: alert, oriented, appears well and in no acute distress  HEENT: atraumatic, conjunttiva clear, no obvious abnormalities on inspection of external nose and ears  NECK: normal movements of the head and neck  LUNGS: on inspection no signs of respiratory distress, breathing rate appears normal, no obvious gross SOB, gasping or wheezing  CV: no obvious cyanosis  PSYCH/NEURO: pleasant and cooperative, no obvious depression or anxiety, speech and thought processing grossly intact  ASSESSMENT AND PLAN:  Discussed the following assessment and plan:  Problem List Items Addressed This Visit    Cough - Primary    Increased congestion and cough as outlined.  Increased sinus pressure and throat congestion.  nasacort nasal spray and saline nasal spray as directed.  Robitussin DM.  Given persistent symptoms over more than one week with increased sinus pressure, etc, will treat for sinus infection.  omnicef as directed.  Probiotic as directed.  Follow.  Send in update.  Will schedule her for covid testing.  Discussed quarantine.       Relevant Orders   COVID-19, Flu A+B and RSV (Completed)   Dysuria    Symptoms and urinalysis as outlined.  Appear to be c/w UTI.  The omnicef should cover.  Await culture results.  Follow.  Notify me if symptoms persists or worsen.            I discussed the assessment and treatment plan with the patient. The patient was provided an opportunity to ask questions and all were answered. The patient agreed with the plan and demonstrated an understanding of the instructions.   The patient was advised to call back or seek an in-person evaluation if the symptoms worsen or if the condition fails to improve as anticipated.    Dale Wiseman, MD

## 2020-05-18 NOTE — Telephone Encounter (Signed)
Pt scheduled for labs to come in and leave urine and added to end of day for virtual

## 2020-05-19 ENCOUNTER — Other Ambulatory Visit: Payer: Medicare HMO

## 2020-05-19 DIAGNOSIS — R059 Cough, unspecified: Secondary | ICD-10-CM

## 2020-05-22 LAB — COVID-19, FLU A+B AND RSV
Influenza A, NAA: NOT DETECTED
Influenza B, NAA: NOT DETECTED
RSV, NAA: NOT DETECTED
SARS-CoV-2, NAA: NOT DETECTED

## 2020-05-23 ENCOUNTER — Encounter: Payer: Self-pay | Admitting: Internal Medicine

## 2020-05-23 NOTE — Assessment & Plan Note (Addendum)
Increased congestion and cough as outlined.  Increased sinus pressure and throat congestion.  nasacort nasal spray and saline nasal spray as directed.  Robitussin DM.  Given persistent symptoms over more than one week with increased sinus pressure, etc, will treat for sinus infection.  omnicef as directed.  Probiotic as directed.  Follow.  Send in update.  Will schedule her for covid testing.  Discussed quarantine.

## 2020-05-23 NOTE — Assessment & Plan Note (Signed)
Symptoms and urinalysis as outlined.  Appear to be c/w UTI.  The omnicef should cover.  Await culture results.  Follow.  Notify me if symptoms persists or worsen.

## 2020-05-26 ENCOUNTER — Encounter: Payer: Self-pay | Admitting: Internal Medicine

## 2020-05-26 NOTE — Telephone Encounter (Signed)
If she is having persistent increased cough/congestion - see if agreeable to be reevaluated to be able to better determine what is needed - for example, cough medication, steroids, etc.  Please schedule her for a virtual visit.  Thanks

## 2020-05-27 ENCOUNTER — Other Ambulatory Visit: Payer: Medicare HMO

## 2020-05-27 ENCOUNTER — Other Ambulatory Visit: Payer: Self-pay

## 2020-05-27 ENCOUNTER — Ambulatory Visit: Payer: Medicare HMO

## 2020-05-27 NOTE — Telephone Encounter (Signed)
Patient scheduled for f/u visit tomorrow.

## 2020-05-27 NOTE — Telephone Encounter (Signed)
LMTCB

## 2020-05-28 ENCOUNTER — Other Ambulatory Visit: Payer: Self-pay | Admitting: Internal Medicine

## 2020-05-28 ENCOUNTER — Telehealth (INDEPENDENT_AMBULATORY_CARE_PROVIDER_SITE_OTHER): Payer: Medicare HMO | Admitting: Internal Medicine

## 2020-05-28 ENCOUNTER — Encounter: Payer: Self-pay | Admitting: Internal Medicine

## 2020-05-28 DIAGNOSIS — R059 Cough, unspecified: Secondary | ICD-10-CM | POA: Diagnosis not present

## 2020-05-28 DIAGNOSIS — N390 Urinary tract infection, site not specified: Secondary | ICD-10-CM | POA: Diagnosis not present

## 2020-05-28 MED ORDER — BENZONATATE 100 MG PO CAPS: 100.0000 mg | ORAL_CAPSULE | Freq: Three times a day (TID) | ORAL | 0 refills | Status: DC | PRN

## 2020-05-28 MED ORDER — CEFDINIR 250 MG/5ML PO SUSR: ORAL | 0 refills | Status: DC

## 2020-05-28 MED ORDER — ALBUTEROL SULFATE HFA 108 (90 BASE) MCG/ACT IN AERS: 2.0000 | INHALATION_SPRAY | Freq: Four times a day (QID) | RESPIRATORY_TRACT | 0 refills | Status: DC | PRN

## 2020-05-28 NOTE — Progress Notes (Signed)
[name], female   DOB: 1945/10/13, 75 y.o.   MRN: [id number]   Virtual Visit via video Note  This visit type was conducted due to national recommendations for restrictions regarding the COVID-19 pandemic (e.g. social distancing).  This format is felt to be most appropriate for this patient at this time.  All issues noted in this document were discussed and addressed.  No physical exam was performed (except for noted visual exam findings with Video Visits).   I connected with Susan Miller by a video enabled telemedicine application and verified that I am speaking with the correct person using two identifiers. Location patient: home Location provider: home office Persons participating in the virtual visit: patient, provider  The limitations, risks, security and privacy concerns of performing an evaluation and management service by video and the availability of in person appointments have been discussed.  It has also been discussed with the patient that there may be a patient responsible charge related to this service. The patient expressed understanding and agreed to proceed.   Reason for visit: work in appt  HPI: Was seen 05/18/20 for work in with concerns regarding a possible UTI and increased cough and congestion.  Was prescribed omnicef for possible UTI.  Instructed to use robitussin DM. covid negative.  Urinary symptoms have resolved.  Reports increased phlegm in throat.  Increased nasal stuffiness.  Using a saline mist - working well.  No headache. No fever.  Throat irritated from coughing.  No chest tightness or sob.  Increased cough.  Used a vaporizer last night.  Helped.      ROS: See pertinent positives and negatives per HPI.  Past Medical History:  Diagnosis Date  . Allergy   . Depression   . History of chicken pox   . Hx: UTI (urinary tract infection)   . Hypertension     Past Surgical History:  Procedure Laterality Date  . COLONOSCOPY WITH PROPOFOL N/A  06/20/2016   Procedure: COLONOSCOPY WITH PROPOFOL;  Surgeon: Midge Minium, MD;  Location: [facility];  Service: Endoscopy;  Laterality: N/A;  . ESOPHAGOGASTRODUODENOSCOPY (EGD) WITH PROPOFOL N/A 06/20/2016   Procedure: ESOPHAGOGASTRODUODENOSCOPY (EGD) WITH PROPOFOL;  Surgeon: Midge Minium, MD;  Location: [facility];  Service: Endoscopy;  Laterality: N/A;  . TONSILLECTOMY     as a child  . TUMOR REMOVAL      Family History  Problem Relation Age of Onset  . Hypertension Mother   . Cancer Sister        colon cancer  . Hypertension Sister   . Diabetes Sister   . Hypertension Brother   . Hypertension Maternal Aunt   . Breast cancer Maternal Aunt 60  . Hypertension Maternal Uncle   . Stroke Maternal Grandmother   . Hypertension Maternal Grandmother     SOCIAL HX: reviewed.    Current Outpatient Medications:  .  benzonatate (TESSALON PERLES) 100 MG capsule, Take 1 capsule (100 mg total) by mouth 3 (three) times daily as needed for cough., Disp: 21 capsule, Rfl: 0 .  albuterol (VENTOLIN HFA) 108 (90 Base) MCG/ACT inhaler, INHALE 2 PUFFS INTO THE LUNGS EVERY 6 HOURS AS NEEDED FOR WHEEZING OR SHORTNESS OF BREATH, Disp: 54 g, Rfl: 1 .  cefdinir (OMNICEF) 250 MG/5ML suspension, Take 300mg  bid (6cc's bid) for 7 days, Disp: 100 mL, Rfl: 0 .  cholecalciferol (VITAMIN D3) 25 MCG (1000 UNIT) tablet, Take by mouth daily. 2,000 units daily, taking a gummy, Disp: , Rfl:  .  conjugated  estrogens (PREMARIN) vaginal cream, Place 1 Applicatorful vaginally daily. Use pea sized amount M-W-Fr before bedtime, Disp: 42.5 g, Rfl: 12 .  Cyanocobalamin (VITAMIN B-12 IJ), Inject as directed. Weekly for 3 weeks and then monthly, Disp: , Rfl:  .  esomeprazole (NEXIUM) 40 MG capsule, TAKE 1 CAPSULE (40 MG TOTAL) BY MOUTH DAILY AT 12 NOON., Disp: 90 capsule, Rfl: 0 .  lisinopril-hydrochlorothiazide (ZESTORETIC) 10-12.5 MG tablet, TAKE 1 TABLET EVERY DAY, Disp: 90 tablet, Rfl: 1 .  rosuvastatin (CRESTOR) 10 MG  tablet, TAKE 1 TABLET EVERY DAY, Disp: 90 tablet, Rfl: 0 .  sertraline (ZOLOFT) 25 MG tablet, TAKE 1 TABLET EVERY DAY, Disp: 90 tablet, Rfl: 1  EXAM:  GENERAL: alert, oriented, appears well and in no acute distress  HEENT: atraumatic, conjunttiva clear, no obvious abnormalities on inspection of external nose and ears  NECK: normal movements of the head and neck  LUNGS: on inspection no signs of respiratory distress, breathing rate appears normal, no obvious gross SOB, gasping or wheezing  CV: no obvious cyanosis  PSYCH/NEURO: pleasant and cooperative, no obvious depression or anxiety, speech and thought processing grossly intact  ASSESSMENT AND PLAN:  Discussed the following assessment and plan:  Problem List Items Addressed This Visit    Cough    Increased cough and congestion as outlined.  Persistent.  Recently treated with omnicef and taking robitussin.  Extend out omnicef.  Continue robitussin and saline mist.  Continue vaporizer.  Tessalon perles as directed.  Albuterol inhaler prn.  Follow closely. Discussed if persistent cough, will obtain cxr.         Frequent UTI    No urinary symptoms now.  Has seen urology.  Using estrogen cream.        Relevant Medications   cefdinir (OMNICEF) 250 MG/5ML suspension       I discussed the assessment and treatment plan with the patient. The patient was provided an opportunity to ask questions and all were answered. The patient agreed with the plan and demonstrated an understanding of the instructions.   The patient was advised to call back or seek an in-person evaluation if the symptoms worsen or if the condition fails to improve as anticipated.   Dale [place], MD

## 2020-05-31 ENCOUNTER — Encounter: Payer: Self-pay | Admitting: Internal Medicine

## 2020-05-31 NOTE — Assessment & Plan Note (Signed)
Increased cough and congestion as outlined.  Persistent.  Recently treated with omnicef and taking robitussin.  Extend out omnicef.  Continue robitussin and saline mist.  Continue vaporizer.  Tessalon perles as directed.  Albuterol inhaler prn.  Follow closely. Discussed if persistent cough, will obtain cxr.

## 2020-05-31 NOTE — Assessment & Plan Note (Signed)
No urinary symptoms now.  Has seen urology.  Using estrogen cream.

## 2020-06-01 ENCOUNTER — Ambulatory Visit: Payer: Medicare HMO | Admitting: Internal Medicine

## 2020-06-07 ENCOUNTER — Encounter: Payer: Self-pay | Admitting: Internal Medicine

## 2020-06-07 DIAGNOSIS — R053 Chronic cough: Secondary | ICD-10-CM

## 2020-06-09 ENCOUNTER — Other Ambulatory Visit: Payer: Self-pay

## 2020-06-09 ENCOUNTER — Ambulatory Visit
Admission: RE | Admit: 2020-06-09 | Discharge: 2020-06-09 | Disposition: A | Discharge status: Home / Self Care | Payer: Medicare HMO | Source: Ambulatory Visit | Attending: Internal Medicine | Admitting: Internal Medicine

## 2020-06-09 DIAGNOSIS — R053 Chronic cough: Secondary | ICD-10-CM | POA: Insufficient documentation

## 2020-06-09 DIAGNOSIS — R059 Cough, unspecified: Secondary | ICD-10-CM | POA: Diagnosis not present

## 2020-06-09 NOTE — Telephone Encounter (Signed)
See my chart message.  I have placed the order for the cxr to be done at [facility].  Please notify pt that she can go by [facility] to get her cxr and to let us know if any change or acute problems.

## 2020-06-18 ENCOUNTER — Encounter: Payer: Self-pay | Admitting: Internal Medicine

## 2020-06-25 ENCOUNTER — Other Ambulatory Visit: Payer: Self-pay

## 2020-06-25 ENCOUNTER — Other Ambulatory Visit: Payer: Self-pay | Admitting: Internal Medicine

## 2020-06-25 ENCOUNTER — Other Ambulatory Visit (INDEPENDENT_AMBULATORY_CARE_PROVIDER_SITE_OTHER): Payer: Medicare HMO

## 2020-06-25 DIAGNOSIS — I10 Essential (primary) hypertension: Secondary | ICD-10-CM

## 2020-06-25 DIAGNOSIS — R739 Hyperglycemia, unspecified: Secondary | ICD-10-CM | POA: Diagnosis not present

## 2020-06-25 DIAGNOSIS — E7800 Pure hypercholesterolemia, unspecified: Secondary | ICD-10-CM

## 2020-06-25 LAB — LIPID PANEL
Cholesterol: 120 mg/dL (ref 0–200)
HDL: 45.9 mg/dL (ref >39.00)
LDL Cholesterol: 54 mg/dL (ref 0–99)
NonHDL: 74.3
Total CHOL/HDL Ratio: 3
Triglycerides: 100 mg/dL (ref 0.0–149.0)
VLDL: 20 mg/dL (ref 0.0–40.0)

## 2020-06-25 LAB — HEPATIC FUNCTION PANEL
ALT: 12 U/L (ref 0–35)
AST: 16 U/L (ref 0–37)
Albumin: 4.1 g/dL (ref 3.5–5.2)
Alkaline Phosphatase: 62 U/L (ref 39–117)
Bilirubin, Direct: 0.1 mg/dL (ref 0.0–0.3)
Total Bilirubin: 0.5 mg/dL (ref 0.2–1.2)
Total Protein: 6.9 g/dL (ref 6.0–8.3)

## 2020-06-25 LAB — BASIC METABOLIC PANEL
BUN: 18 mg/dL (ref 6–23)
CO2: 29 mEq/L (ref 19–32)
Calcium: 9.8 mg/dL (ref 8.4–10.5)
Chloride: 105 mEq/L (ref 96–112)
Creatinine, Ser: 1.1 mg/dL (ref 0.40–1.20)
GFR: 49.57 mL/min — ABNORMAL LOW (ref >60.00)
Glucose, Bld: 121 mg/dL — ABNORMAL HIGH (ref 70–99)
Potassium: 4.2 mEq/L (ref 3.5–5.1)
Sodium: 142 mEq/L (ref 135–145)

## 2020-06-25 LAB — HEMOGLOBIN A1C: Hgb A1c MFr Bld: 6.2 % (ref 4.6–6.5)

## 2020-06-29 ENCOUNTER — Other Ambulatory Visit: Payer: Self-pay

## 2020-06-29 ENCOUNTER — Ambulatory Visit (INDEPENDENT_AMBULATORY_CARE_PROVIDER_SITE_OTHER): Payer: Medicare HMO | Admitting: Internal Medicine

## 2020-06-29 ENCOUNTER — Other Ambulatory Visit: Payer: Self-pay | Admitting: Internal Medicine

## 2020-06-29 DIAGNOSIS — F32A Depression, unspecified: Secondary | ICD-10-CM

## 2020-06-29 DIAGNOSIS — K219 Gastro-esophageal reflux disease without esophagitis: Secondary | ICD-10-CM | POA: Diagnosis not present

## 2020-06-29 DIAGNOSIS — G4733 Obstructive sleep apnea (adult) (pediatric): Secondary | ICD-10-CM | POA: Diagnosis not present

## 2020-06-29 DIAGNOSIS — F320 Major depressive disorder, single episode, mild: Secondary | ICD-10-CM

## 2020-06-29 DIAGNOSIS — I10 Essential (primary) hypertension: Secondary | ICD-10-CM

## 2020-06-29 DIAGNOSIS — R739 Hyperglycemia, unspecified: Secondary | ICD-10-CM | POA: Diagnosis not present

## 2020-06-29 DIAGNOSIS — E538 Deficiency of other specified B group vitamins: Secondary | ICD-10-CM

## 2020-06-29 DIAGNOSIS — R059 Cough, unspecified: Secondary | ICD-10-CM

## 2020-06-29 DIAGNOSIS — E7800 Pure hypercholesterolemia, unspecified: Secondary | ICD-10-CM | POA: Diagnosis not present

## 2020-06-29 MED ORDER — CYANOCOBALAMIN 1000 MCG/ML IJ SOLN
1000.0000 ug | Freq: Once | INTRAMUSCULAR | Status: AC
  Administered 2020-06-29: 1000 ug via INTRAMUSCULAR

## 2020-06-29 MED ORDER — TELMISARTAN 20 MG PO TABS: 20.0000 mg | ORAL_TABLET | Freq: Every day | ORAL | 1 refills | Status: DC

## 2020-06-29 MED ORDER — HYDROCHLOROTHIAZIDE 12.5 MG PO CAPS: 12.5000 mg | ORAL_CAPSULE | Freq: Every day | ORAL | 1 refills | Status: DC

## 2020-06-29 NOTE — Patient Instructions (Signed)
Add pepcid - 30 minutes before evening meal  Stop lisinopril/hctz Start hctz 12.5mg  - one per day micardis 20mg  - one per dya.

## 2020-06-29 NOTE — Progress Notes (Signed)
[name], female   DOB: 1945-12-04, 75 y.o.   MRN: [id number]   Subjective:    [name], female    DOB: April 14, 1946, 75 y.o.   MRN: [id number]  HPI This visit occurred during the SARS-CoV-2 public health emergency.  Safety protocols were in place, including screening questions prior to the visit, additional usage of staff PPE, and extensive cleaning of exam room while observing appropriate contact time as indicated for disinfecting solutions.  Patient here for a scheduled follow up.  Here to follow up regarding persistent cough.  Also follow up regarding her blood pressure and increased stress.  Persistent stress.  Discussed.  Overall handling things relatively well.  She does not feel needs any further intervention at this time.  She has had problems recently with phlegm - in her throat. Persistent cough.  Previously treated with omnicef.  cxr clear.  Taking mucinex and using nasal spray.  States her head feels sticky.  No chest pain or sob reported.  No fever. Previous covid test negative.  Taking nexium.  If misses one day, will have symptoms.  States is relatively well controlled on the medication.  No dysphagia.    Past Medical History:  Diagnosis Date  . Allergy   . Depression   . History of chicken pox   . Hx: UTI (urinary tract infection)   . Hypertension    Past Surgical History:  Procedure Laterality Date  . COLONOSCOPY WITH PROPOFOL N/A 06/20/2016   Procedure: COLONOSCOPY WITH PROPOFOL;  Surgeon: Lucilla Lame, MD;  Location: [facility] END[facility];  Service: Endoscopy;  Laterality: N/A;  . ESOPHAGOGASTRODUODENOSCOPY (EGD) WITH PROPOFOL N/A 06/20/2016   Procedure: ESOPHAGOGASTRODUODENOSCOPY (EGD) WITH PROPOFOL;  Surgeon: Lucilla Lame, MD;  Location: [facility] ENDOSCOPY;  Service: Endoscopy;  Laterality: N/A;  . TONSILLECTOMY     as a child  . TUMOR REMOVAL     Family History  Problem Relation Age of Onset  . Hypertension Mother   . Cancer Sister         colon cancer  . Hypertension Sister   . Diabetes Sister   . Hypertension Brother   . Hypertension Maternal Aunt   . Breast cancer Maternal Aunt 60  . Hypertension Maternal Uncle   . Stroke Maternal Grandmother   . Hypertension Maternal Grandmother    Social History   Socioeconomic History  . Marital status: Divorced    Spouse name: Not on file  . Number of children: Not on file  . Years of education: Not on file  . Highest education level: Not on file  Occupational History  . Not on file  Tobacco Use  . Smoking status: Never Smoker  . Smokeless tobacco: Never Used  Vaping Use  . Vaping Use: Never used  Substance and Sexual Activity  . Alcohol use: No    Alcohol/week: 0.0 standard drinks  . Drug use: No  . Sexual activity: Not Currently  Other Topics Concern  . Not on file  Social History Narrative  . Not on file   Social Determinants of Health   Financial Resource Strain: Low Risk   . Difficulty of Paying Living Expenses: Not hard at all  Food Insecurity: No Food Insecurity  . Worried About Charity fundraiser in the Last Year: Never true  . Ran Out of Food in the Last Year: Never true  Transportation Needs: No Transportation Needs  . Lack of Transportation (Medical): No  . Lack of Transportation (Non-Medical): No  Physical Activity: Unknown  . Days of Exercise per Week: 0 days  . Minutes of Exercise per Session: Not on file  Stress: No Stress Concern Present  . Feeling of Stress : Not at all  Social Connections: Unknown  . Frequency of Communication with Friends and Family: More than three times a week  . Frequency of Social Gatherings with Friends and Family: More than three times a week  . Attends Religious Services: Not on file  . Active Member of Clubs or Organizations: Not on file  . Attends Archivist Meetings: Not on file  . Marital Status: Not on file    Outpatient Encounter Medications as of 06/29/2020  Medication Sig  . [DISCONTINUED]  hydrochlorothiazide (MICROZIDE) 12.5 MG capsule Take 1 capsule (12.5 mg total) by mouth daily.  . [DISCONTINUED] telmisartan (MICARDIS) 20 MG tablet Take 1 tablet (20 mg total) by mouth daily.  Marland Kitchen albuterol (VENTOLIN HFA) 108 (90 Base) MCG/ACT inhaler INHALE 2 PUFFS INTO THE LUNGS EVERY 6 HOURS AS NEEDED FOR WHEEZING OR SHORTNESS OF BREATH  . benzonatate (TESSALON PERLES) 100 MG capsule Take 1 capsule (100 mg total) by mouth 3 (three) times daily as needed for cough.  . cefdinir (OMNICEF) 250 MG/5ML suspension Take 369m bid (6cc's bid) for 7 days  . cholecalciferol (VITAMIN D3) 25 MCG (1000 UNIT) tablet Take by mouth daily. 2,000 units daily, taking a gummy  . conjugated estrogens (PREMARIN) vaginal cream Place 1 Applicatorful vaginally daily. Use pea sized amount M-W-Fr before bedtime  . Cyanocobalamin (VITAMIN B-12 IJ) Inject as directed. Weekly for 3 weeks and then monthly  . esomeprazole (NEXIUM) 40 MG capsule TAKE 1 CAPSULE DAILY AT 12 NOON  . rosuvastatin (CRESTOR) 10 MG tablet TAKE 1 TABLET EVERY DAY  . sertraline (ZOLOFT) 25 MG tablet TAKE 1 TABLET EVERY DAY  . [DISCONTINUED] lisinopril-hydrochlorothiazide (ZESTORETIC) 10-12.5 MG tablet TAKE 1 TABLET EVERY DAY  . [EXPIRED] cyanocobalamin ((VITAMIN B-12)) injection 1,000 mcg    No facility-administered encounter medications on file as of 06/29/2020.    Review of Systems  Constitutional: Negative for appetite change, fever and unexpected weight change.  HENT: Negative for sinus pressure.        Describes head feels sticky.  Some mucus in her throat.   Respiratory: Negative for cough, chest tightness and shortness of breath.   Cardiovascular: Negative for chest pain, palpitations and leg swelling.  Gastrointestinal: Negative for abdominal pain, diarrhea, nausea and vomiting.  Genitourinary: Negative for difficulty urinating and dysuria.  Musculoskeletal: Negative for joint swelling and myalgias.  Skin: Negative for color change and  rash.  Neurological: Negative for dizziness, light-headedness and headaches.  Psychiatric/Behavioral: Negative for agitation and dysphoric mood.       Objective:    Physical Exam Vitals reviewed.  Constitutional:      General: She is not in acute distress.    Appearance: Normal appearance.  HENT:     Head: Normocephalic and atraumatic.     Right Ear: External ear normal.     Left Ear: External ear normal.     Mouth/Throat:     Mouth: Oropharynx is clear and moist.  Eyes:     General:        Right eye: No discharge.        Left eye: No discharge.     Conjunctiva/sclera: Conjunctivae normal.  Neck:     Thyroid: No thyromegaly.  Cardiovascular:     Rate and Rhythm: Normal rate and regular rhythm.  Pulmonary:  Effort: No respiratory distress.     Breath sounds: Normal breath sounds. No wheezing.  Abdominal:     General: Bowel sounds are normal.     Palpations: Abdomen is soft.     Tenderness: There is no abdominal tenderness.  Musculoskeletal:        General: No swelling, tenderness or edema.     Cervical back: Neck supple. No tenderness.  Lymphadenopathy:     Cervical: No cervical adenopathy.  Skin:    Findings: No erythema or rash.  Neurological:     Mental Status: She is alert.  Psychiatric:        Mood and Affect: Mood normal.        Behavior: Behavior normal.     BP 122/80   Pulse 89   Temp 98 F (36.7 C) (Oral)   Resp 16   Ht '5\' 7"'  (1.702 m)   Wt 227 lb 3.2 oz (103.1 kg)   SpO2 97%   BMI 35.58 kg/m  Wt Readings from Last 3 Encounters:  06/29/20 227 lb 3.2 oz (103.1 kg)  05/18/20 225 lb (102.1 kg)  05/14/20 225 lb (102.1 kg)     Lab Results  Component Value Date   WBC 7.3 02/20/2020   HGB 12.3 02/20/2020   HCT 36.6 02/20/2020   PLT 250.0 02/20/2020   GLUCOSE 121 (H) 06/25/2020   CHOL 120 06/25/2020   TRIG 100.0 06/25/2020   HDL 45.90 06/25/2020   LDLCALC 54 06/25/2020   ALT 12 06/25/2020   AST 16 06/25/2020   NA 142 06/25/2020   K  4.2 06/25/2020   CL 105 06/25/2020   CREATININE 1.10 06/25/2020   BUN 18 06/25/2020   CO2 29 06/25/2020   TSH 2.46 02/20/2020   HGBA1C 6.2 06/25/2020    DG Chest 2 View  Result Date: 06/09/2020 CLINICAL DATA:  Persistent cough for 3 weeks EXAM: CHEST - 2 VIEW COMPARISON:  06/12/2019 FINDINGS: Cardiac shadow is stable. Lungs are well aerated bilaterally. No focal infiltrate or sizable effusion is seen. Previously seen nodular density in the right medial lung base is not well appreciated on this exam. No acute bony abnormality is seen. Stable density is noted over the midthoracic spine as previously described. This likely represents osteophytic change. IMPRESSION: No acute abnormality noted. Electronically Signed   By: Inez Catalina M.D.   On: 06/09/2020 22:16       Assessment & Plan:   Problem List Items Addressed This Visit    Cough    Persistent.  covid test negative.  Treated previously for sinus/uri.  Continue saline nasal spray and mucinex.  Add pepcid in evening.  Continue nexium in am.  Stop ace inhibitor.  Previous cxr clear.  Discussed further w/up.  Refer to pulmonary.        Relevant Orders   Ambulatory referral to Pulmonology   Essential hypertension, benign    Blood pressure controlled.  Question if lisinopril could be contributing to persistent issues with cough.  Will stop lisinopril/hctz.  Start hctz 12.42m q day.  Also add micardis 230mq day.  Follow pressures.  Follow metabolic panel.       GERD (gastroesophageal reflux disease)    On nexium.  States if misses one dose, will notice flare.  Question if contributing to cough/congestion. Continue nexium in am.  Add pepcid in pm.  Follow.        Hypercholesteremia    On crestor.  Low cholesterol diet and exercise.  Follow lipid  panel and liver function tests.        Hyperglycemia    Low carb diet and exercise.  Follow met b and a1c.       Mild depression ([place])    Discussed. On zoloft.  Does not feel needs any  further intervention.  Follow.       OSA (obstructive sleep apnea)    Saw pulmonary.  Wanted to work on weight loss.  Recommended f/u in 6 months.  Last seen 02/2020.        Other Visit Diagnoses    B12 deficiency       Relevant Medications   cyanocobalamin ((VITAMIN B-12)) injection 1,000 mcg (Completed)       Einar Pheasant, MD

## 2020-06-30 ENCOUNTER — Encounter: Payer: Self-pay | Admitting: Internal Medicine

## 2020-06-30 NOTE — Assessment & Plan Note (Signed)
Saw pulmonary.  Wanted to work on weight loss.  Recommended f/u in 6 months.  Last seen 02/2020.

## 2020-06-30 NOTE — Assessment & Plan Note (Signed)
Blood pressure controlled.  Question if lisinopril could be contributing to persistent issues with cough.  Will stop lisinopril/hctz.  Start hctz 12.5mg  q day.  Also add micardis 20mg  q day.  Follow pressures.  Follow metabolic panel.

## 2020-06-30 NOTE — Assessment & Plan Note (Signed)
On nexium.  States if misses one dose, will notice flare.  Question if contributing to cough/congestion. Continue nexium in am.  Add pepcid in pm.  Follow.

## 2020-06-30 NOTE — Assessment & Plan Note (Signed)
Persistent.  covid test negative.  Treated previously for sinus/uri.  Continue saline nasal spray and mucinex.  Add pepcid in evening.  Continue nexium in am.  Stop ace inhibitor.  Previous cxr clear.  Discussed further w/up.  Refer to pulmonary.

## 2020-06-30 NOTE — Assessment & Plan Note (Signed)
On crestor.  Low cholesterol diet and exercise.  Follow lipid panel and liver function tests.   

## 2020-06-30 NOTE — Assessment & Plan Note (Signed)
Discussed. On zoloft.  Does not feel needs any further intervention.  Follow.

## 2020-06-30 NOTE — Assessment & Plan Note (Signed)
Low carb diet and exercise.  Follow met b and a1c.  

## 2020-07-07 ENCOUNTER — Encounter: Payer: Self-pay | Admitting: Internal Medicine

## 2020-07-15 ENCOUNTER — Encounter: Payer: Self-pay | Admitting: Internal Medicine

## 2020-07-15 DIAGNOSIS — R0981 Nasal congestion: Secondary | ICD-10-CM

## 2020-07-17 NOTE — Telephone Encounter (Signed)
Order placed for ENT referral.   

## 2020-08-02 ENCOUNTER — Telehealth: Payer: Medicare HMO | Admitting: Physician Assistant

## 2020-08-02 DIAGNOSIS — R300 Dysuria: Secondary | ICD-10-CM | POA: Diagnosis not present

## 2020-08-02 MED ORDER — CEPHALEXIN 250 MG/5ML PO SUSR
500.0000 mg | Freq: Two times a day (BID) | ORAL | 0 refills | Status: DC
Start: 2020-08-02 — End: 2021-05-03

## 2020-08-02 NOTE — Progress Notes (Signed)

## 2020-08-02 NOTE — Progress Notes (Signed)
I have spent 5 minutes in review of e-visit questionnaire, review and updating patient chart, medical decision making and response to patient.   Duston Smolenski Cody Kyland No, PA-C    

## 2020-08-06 ENCOUNTER — Other Ambulatory Visit: Payer: Self-pay | Admitting: Otolaryngology

## 2020-08-06 DIAGNOSIS — J301 Allergic rhinitis due to pollen: Secondary | ICD-10-CM | POA: Diagnosis not present

## 2020-08-06 DIAGNOSIS — J324 Chronic pansinusitis: Secondary | ICD-10-CM

## 2020-08-06 DIAGNOSIS — R059 Cough, unspecified: Secondary | ICD-10-CM | POA: Diagnosis not present

## 2020-08-11 ENCOUNTER — Ambulatory Visit: Payer: Medicare HMO | Admitting: Internal Medicine

## 2020-08-11 ENCOUNTER — Other Ambulatory Visit: Payer: Self-pay

## 2020-08-11 ENCOUNTER — Encounter: Payer: Self-pay | Admitting: Internal Medicine

## 2020-08-11 VITALS — BP 156/78 | HR 94 | Temp 97.1°F | Resp 97 | Ht 67.0 in | Wt 228.8 lb

## 2020-08-11 DIAGNOSIS — R059 Cough, unspecified: Secondary | ICD-10-CM | POA: Diagnosis not present

## 2020-08-11 DIAGNOSIS — G4733 Obstructive sleep apnea (adult) (pediatric): Secondary | ICD-10-CM | POA: Diagnosis not present

## 2020-08-11 NOTE — Progress Notes (Signed)
@Patient  ID: , female    DOB: 11-21-1945, 75 y.o.   MRN: [id number]  Referring provider: [id number], MD  SYNOPSIS 75 year old female with history of hypertension presents January 29, 2020 for sleep consult for restless sleep, snoring, daytime sleepiness Medical history significant for seasonal allergies , hypertension and Hyperlipidemia. **February 18, 2020 showed mild obstructive sleep apnea with AHI 5.4/hour and SPO2 low at 87%   CC  follow up MILD OSA  HPI Non-smoker, no pets, no hobbies Not active, does housework  No h/o CHF or stroke  Patient has had persistent cough for several months, scribed as nonproductive intermittent chest congestion Has seen ENT -she is obtaining CT of the head and sinuses, patient was given prednisone and will sprays sprays-treatment has helped  Patient has been treated with albuterol inhaler as well with Flovent HFA  Patient does have a history of mild OSA diagnosed in October 2021 Patient currently has history of GERD on PPI seems to be controlled at this time        Allergies  Allergen Reactions  . Codeine Hives  . Iodinated Diagnostic Agents Swelling  . Nitrofurantoin Monohyd Macro Nausea Only    Immunization History  Administered Date(s) Administered  . Fluad Quad(high Dose 65+) 01/31/2019, 01/29/2020  . Influenza, High Dose Seasonal PF 01/11/2016, 04/27/2017, 01/18/2018  . Influenza,inj,Quad PF,6+ Mos 02/24/2013, 02/27/2014, 02/22/2015  . PFIZER(Purple Top)SARS-COV-2 Vaccination 06/23/2019, 07/14/2019  . Pneumococcal Conjugate-13 11/06/2016  . Pneumococcal Polysaccharide-23 12/06/2017    Past Medical History:  Diagnosis Date  . Allergy   . Depression   . History of chicken pox   . Hx: UTI (urinary tract infection)   . Hypertension     Tobacco History: Social History   Tobacco Use  Smoking Status Never Smoker  Smokeless Tobacco Never Used   Counseling given: Not Answered   Outpatient  Medications Prior to Visit  Medication Sig Dispense Refill  . albuterol (VENTOLIN HFA) 108 (90 Base) MCG/ACT inhaler INHALE 2 PUFFS INTO THE LUNGS EVERY 6 HOURS AS NEEDED FOR WHEEZING OR SHORTNESS OF BREATH 54 g 1  . cholecalciferol (VITAMIN D3) 25 MCG (1000 UNIT) tablet Take by mouth daily. 2,000 units daily, taking a gummy    . conjugated estrogens (PREMARIN) vaginal cream Place 1 Applicatorful vaginally daily. Use pea sized amount M-W-Fr before bedtime 42.5 g 12  . Cyanocobalamin (VITAMIN B-12 IJ) Inject as directed. Weekly for 3 weeks and then monthly    . esomeprazole (NEXIUM) 40 MG capsule TAKE 1 CAPSULE DAILY AT 12 NOON 90 capsule 0  . hydrochlorothiazide (MICROZIDE) 12.5 MG capsule TAKE 1 CAPSULE(12.5 MG) BY MOUTH DAILY 90 capsule 1  . rosuvastatin (CRESTOR) 10 MG tablet TAKE 1 TABLET EVERY DAY 90 tablet 0  . sertraline (ZOLOFT) 25 MG tablet TAKE 1 TABLET EVERY DAY 90 tablet 1  . telmisartan (MICARDIS) 20 MG tablet TAKE 1 TABLET(20 MG) BY MOUTH DAILY 90 tablet 1   No facility-administered medications prior to visit.     Review of Systems:  Gen:  Denies  fever, sweats, chills weight loss  HEENT: Denies blurred vision, double vision, ear pain, eye pain, hearing loss, nose bleeds, sore throat Cardiac:  No dizziness, chest pain or heaviness, chest tightness,edema, No JVD Resp:   No cough, -sputum production, -shortness of breath,-wheezing, -hemoptysis,  Gi: Denies swallowing difficulty, stomach pain, nausea or vomiting, diarrhea, constipation, bowel incontinence Gu:  Denies bladder incontinence, burning urine Ext:   Denies Joint pain, stiffness or swelling Skin: Denies  skin rash, easy bruising or bleeding or hives Endoc:  Denies polyuria, polydipsia , polyphagia or weight change Psych:   Denies depression, insomnia or hallucinations  Other:  All other systems negative  BP (!) 156/78 (BP Location: Left Arm, Patient Position: Sitting, Cuff Size: Normal)   Pulse 94   Temp (!) 97.1  F (36.2 C) (Temporal)   Resp (!) 97   Ht 5\' 7"  (1.702 m)   Wt 228 lb 12.8 oz (103.8 kg)   BMI 35.84 kg/m   Physical Examination:   General Appearance: No distress  Neuro:without focal findings,  speech normal,  HEENT: PERRLA, EOM intact.   Pulmonary: normal breath sounds, No wheezing.  CardiovascularNormal S1,S2.  No m/r/g.   Abdomen: Benign, Soft, non-tender. Renal:  No costovertebral tenderness  GU:  Not performed at this time. Endoc: No evident thyromegaly Skin:   warm, no rashes, no ecchymosis  Extremities: normal, no cyanosis, clubbing. PSYCHIATRIC: Mood, affect within normal limits.   ALL OTHER ROS ARE NEGATIVE      Lab Results:  CBC    Component Value Date/Time   WBC 7.3 02/20/2020 1050   RBC 4.01 02/20/2020 1050   HGB 12.3 02/20/2020 1050   HGB 12.3 04/27/2017 1130   HCT 36.6 02/20/2020 1050   HCT 36.7 04/27/2017 1130   PLT 250.0 02/20/2020 1050   PLT 294 04/27/2017 1130   MCV 91.2 02/20/2020 1050   MCV 89 04/27/2017 1130   MCH 30.1 06/12/2019 2030   MCHC 33.5 02/20/2020 1050   RDW 13.2 02/20/2020 1050   RDW 13.6 04/27/2017 1130   LYMPHSABS 2.6 02/20/2020 1050   LYMPHSABS 2.9 04/27/2017 1130   MONOABS 0.7 02/20/2020 1050   EOSABS 0.2 02/20/2020 1050   EOSABS 0.2 04/27/2017 1130   BASOSABS 0.0 02/20/2020 1050   BASOSABS 0.0 04/27/2017 1130    BMET    Component Value Date/Time   NA 142 06/25/2020 0815   NA 147 (H) 04/23/2017 0759   K 4.2 06/25/2020 0815   CL 105 06/25/2020 0815   CO2 29 06/25/2020 0815   GLUCOSE 121 (H) 06/25/2020 0815   BUN 18 06/25/2020 0815   BUN 19 04/23/2017 0759   CREATININE 1.10 06/25/2020 0815   CALCIUM 9.8 06/25/2020 0815   GFRNONAA 50 (L) 06/12/2019 2030   GFRAA 58 (L) 06/12/2019 2030    BNP    Component Value Date/Time   BNP 90.0 11/09/2018 0849    ASSESSMENT/PLAN  75 yo obese white female with underlying OSA with chronic intermittant nonproductive cough with signs and symptoms of  Reactive airways  disease with underlying obesity and GERD. At this time, I would recommend starting AUTO CPAP therapy as well as continue with FLOVENT HFA and albuterol as needed   COVID-19 EDUCATION: The signs and symptoms of COVID-19 were discussed with the patient. Importance of hand Hygiene and Social Distancing   MEDICATION ADJUSTMENTS/LABS AND TESTS ORDERED: Start auto CPAP 5 to 12 cm water pressure Continue Flovent HFA as prescribed Follow-up ENT recommendations Continue albuterol as needed Recommend weight loss Continue Nexium for GERD  CURRENT MEDICATIONS REVIEWED AT LENGTH WITH PATIENT TODAY   Patient satisfied with Plan of action and management. All questions answered  Follow up 3 months  Total time spent 37 mins  66, M.D.  [facility]  Medical Director [facility] Medical Director [facility]

## 2020-08-11 NOTE — Patient Instructions (Signed)
Start auto CPAP 5 to 12 cm water pressure Continue Flovent HFA as prescribed Follow-up ENT recommendations Continue albuterol as needed Recommend weight loss Continue Nexium for GERD

## 2020-08-17 ENCOUNTER — Encounter: Payer: Self-pay | Admitting: Internal Medicine

## 2020-08-17 ENCOUNTER — Other Ambulatory Visit: Payer: Self-pay

## 2020-08-17 ENCOUNTER — Ambulatory Visit (INDEPENDENT_AMBULATORY_CARE_PROVIDER_SITE_OTHER): Payer: Medicare HMO | Admitting: Internal Medicine

## 2020-08-17 VITALS — BP 112/58 | HR 101 | Temp 97.6°F | Ht 67.0 in | Wt 223.6 lb

## 2020-08-17 DIAGNOSIS — Z1231 Encounter for screening mammogram for malignant neoplasm of breast: Secondary | ICD-10-CM

## 2020-08-17 DIAGNOSIS — R739 Hyperglycemia, unspecified: Secondary | ICD-10-CM

## 2020-08-17 DIAGNOSIS — I10 Essential (primary) hypertension: Secondary | ICD-10-CM

## 2020-08-17 DIAGNOSIS — R059 Cough, unspecified: Secondary | ICD-10-CM | POA: Diagnosis not present

## 2020-08-17 DIAGNOSIS — K219 Gastro-esophageal reflux disease without esophagitis: Secondary | ICD-10-CM | POA: Diagnosis not present

## 2020-08-17 DIAGNOSIS — F320 Major depressive disorder, single episode, mild: Secondary | ICD-10-CM

## 2020-08-17 DIAGNOSIS — E7800 Pure hypercholesterolemia, unspecified: Secondary | ICD-10-CM | POA: Diagnosis not present

## 2020-08-17 DIAGNOSIS — G4733 Obstructive sleep apnea (adult) (pediatric): Secondary | ICD-10-CM | POA: Diagnosis not present

## 2020-08-17 DIAGNOSIS — F32A Depression, unspecified: Secondary | ICD-10-CM

## 2020-08-17 MED ORDER — HYDROCHLOROTHIAZIDE 25 MG PO TABS: ORAL_TABLET | ORAL | 2 refills | Status: DC

## 2020-08-17 MED ORDER — LOSARTAN POTASSIUM 25 MG PO TABS: 25.0000 mg | ORAL_TABLET | Freq: Every day | ORAL | 2 refills | Status: DC

## 2020-08-17 NOTE — Patient Instructions (Signed)
Stop lisinopril/hctz  Remain off micardis  Start hctz 25mg  1/2 tablet per day Start losartan 25mg  one per day

## 2020-08-17 NOTE — Progress Notes (Signed)
[name], female   DOB: 03-10-1946, 75 y.o.   MRN: [id number]   Subjective:    [name], female    DOB: 1946/05/01, 75 y.o.   MRN: [id number]  HPI This visit occurred during the SARS-CoV-2 public health emergency.  Safety protocols were in place, including screening questions prior to the visit, additional usage of staff PPE, and extensive cleaning of exam room while observing appropriate contact time as indicated for disinfecting solutions.  Patient here for a scheduled follow up.  Here to follow up regarding her blood pressure and persistent cough.  Changes made last visit with her blood pressure medication.  Had stopped her ace inhibitor due to concern could be contributing to her cough.  Was changed to 12.64m hctz and micardis.  She reports she could not swallow the capsule and she restarted her previous medication.  Blood pressure doing well.  No chest pain.  No increased sob reported.  Scheduled for CT head/sinus next week.  Seeing ENT.  Using nasal spray.  Has flovent/albuterol.  Mucus improved.  Using inhaler bid.  Saw Dr KMortimer Fries4/6/22.  Recommended starting auto cpap.  No acid reflux reported.  No abdominal pain.  Bowels moving.    Past Medical History:  Diagnosis Date  . Allergy   . Depression   . History of chicken pox   . Hx: UTI (urinary tract infection)   . Hypertension    Past Surgical History:  Procedure Laterality Date  . COLONOSCOPY WITH PROPOFOL N/A 06/20/2016   Procedure: COLONOSCOPY WITH PROPOFOL;  Surgeon: DLucilla Lame MD;  Location: [facility] ENDOSCOPY;  Service: Endoscopy;  Laterality: N/A;  . ESOPHAGOGASTRODUODENOSCOPY (EGD) WITH PROPOFOL N/A 06/20/2016   Procedure: ESOPHAGOGASTRODUODENOSCOPY (EGD) WITH PROPOFOL;  Surgeon: DLucilla Lame MD;  Location: [facility] ENDOSCOPY;  Service: Endoscopy;  Laterality: N/A;  . TONSILLECTOMY     as a child  . TUMOR REMOVAL     Family History  Problem Relation Age of Onset  . Hypertension Mother   .  Cancer Sister        colon cancer  . Hypertension Sister   . Diabetes Sister   . Hypertension Brother   . Hypertension Maternal Aunt   . Breast cancer Maternal Aunt 60  . Hypertension Maternal Uncle   . Stroke Maternal Grandmother   . Hypertension Maternal Grandmother    Social History   Socioeconomic History  . Marital status: Divorced    Spouse name: Not on file  . Number of children: Not on file  . Years of education: Not on file  . Highest education level: Not on file  Occupational History  . Not on file  Tobacco Use  . Smoking status: Never Smoker  . Smokeless tobacco: Never Used  Vaping Use  . Vaping Use: Never used  Substance and Sexual Activity  . Alcohol use: No    Alcohol/week: 0.0 standard drinks  . Drug use: No  . Sexual activity: Not Currently  Other Topics Concern  . Not on file  Social History Narrative  . Not on file   Social Determinants of Health   Financial Resource Strain: Low Risk   . Difficulty of Paying Living Expenses: Not hard at all  Food Insecurity: No Food Insecurity  . Worried About RCharity fundraiserin the Last Year: Never true  . Ran Out of Food in the Last Year: Never true  Transportation Needs: No Transportation Needs  . Lack of Transportation (Medical): No  .  Lack of Transportation (Non-Medical): No  Physical Activity: Unknown  . Days of Exercise per Week: 0 days  . Minutes of Exercise per Session: Not on file  Stress: No Stress Concern Present  . Feeling of Stress : Not at all  Social Connections: Unknown  . Frequency of Communication with Friends and Family: More than three times a week  . Frequency of Social Gatherings with Friends and Family: More than three times a week  . Attends Religious Services: Not on file  . Active Member of Clubs or Organizations: Not on file  . Attends Archivist Meetings: Not on file  . Marital Status: Not on file    Outpatient Encounter Medications as of 08/17/2020  Medication  Sig  . albuterol (VENTOLIN HFA) 108 (90 Base) MCG/ACT inhaler INHALE 2 PUFFS INTO THE LUNGS EVERY 6 HOURS AS NEEDED FOR WHEEZING OR SHORTNESS OF BREATH  . azelastine (ASTELIN) 0.1 % nasal spray Place 2 sprays into both nostrils 2 (two) times daily. Use in each nostril as directed  . cholecalciferol (VITAMIN D3) 25 MCG (1000 UNIT) tablet Take by mouth daily. 2,000 units daily, taking a gummy  . conjugated estrogens (PREMARIN) vaginal cream Place 1 Applicatorful vaginally daily. Use pea sized amount M-W-Fr before bedtime  . Cyanocobalamin (VITAMIN B-12 IJ) Inject as directed. Weekly for 3 weeks and then monthly  . esomeprazole (NEXIUM) 40 MG capsule TAKE 1 CAPSULE DAILY AT 12 NOON  . fluticasone (VERAMYST) 27.5 MCG/SPRAY nasal spray Place 2 sprays into the nose daily.  . Fluticasone Propionate, Inhal, (FLOVENT IN) Inhale into the lungs.  . hydrochlorothiazide (HYDRODIURIL) 25 MG tablet Take 1/2 tablet q day  . losartan (COZAAR) 25 MG tablet Take 1 tablet (25 mg total) by mouth daily.  . rosuvastatin (CRESTOR) 10 MG tablet TAKE 1 TABLET EVERY DAY  . sertraline (ZOLOFT) 25 MG tablet TAKE 1 TABLET EVERY DAY  . [DISCONTINUED] hydrochlorothiazide (MICROZIDE) 12.5 MG capsule TAKE 1 CAPSULE(12.5 MG) BY MOUTH DAILY  . [DISCONTINUED] lisinopril-hydrochlorothiazide (ZESTORETIC) 10-12.5 MG tablet   . [DISCONTINUED] telmisartan (MICARDIS) 20 MG tablet TAKE 1 TABLET(20 MG) BY MOUTH DAILY   No facility-administered encounter medications on file as of 08/17/2020.    Review of Systems  Constitutional: Negative for appetite change and unexpected weight change.  HENT: Negative for congestion and sinus pressure.   Respiratory: Negative for chest tightness and shortness of breath.        Persistent cough as outlined.   Cardiovascular: Negative for chest pain, palpitations and leg swelling.  Gastrointestinal: Negative for abdominal pain, diarrhea, nausea and vomiting.  Genitourinary: Negative for difficulty  urinating and dysuria.  Musculoskeletal: Negative for joint swelling and myalgias.  Skin: Negative for color change and rash.  Neurological: Negative for dizziness, light-headedness and headaches.  Psychiatric/Behavioral: Negative for agitation and dysphoric mood.       Objective:    Physical Exam Vitals reviewed.  Constitutional:      General: She is not in acute distress.    Appearance: Normal appearance.  HENT:     Head: Normocephalic and atraumatic.     Right Ear: External ear normal.     Left Ear: External ear normal.  Eyes:     General: No scleral icterus.       Right eye: No discharge.        Left eye: No discharge.     Conjunctiva/sclera: Conjunctivae normal.  Neck:     Thyroid: No thyromegaly.  Cardiovascular:     Rate and  Rhythm: Normal rate and regular rhythm.  Pulmonary:     Effort: No respiratory distress.     Breath sounds: Normal breath sounds. No wheezing.  Abdominal:     General: Bowel sounds are normal.     Palpations: Abdomen is soft.     Tenderness: There is no abdominal tenderness.  Musculoskeletal:        General: No swelling or tenderness.     Cervical back: Neck supple. No tenderness.  Lymphadenopathy:     Cervical: No cervical adenopathy.  Skin:    Findings: No erythema or rash.  Neurological:     Mental Status: She is alert.  Psychiatric:        Mood and Affect: Mood normal.        Behavior: Behavior normal.     BP (!) 112/58   Pulse (!) 101   Temp 97.6 F (36.4 C)   Ht '5\' 7"'  (1.702 m)   Wt 223 lb 9.6 oz (101.4 kg)   SpO2 97%   BMI 35.02 kg/m  Wt Readings from Last 3 Encounters:  08/18/20 226 lb (102.5 kg)  08/17/20 223 lb 9.6 oz (101.4 kg)  08/11/20 228 lb 12.8 oz (103.8 kg)     Lab Results  Component Value Date   WBC 7.3 02/20/2020   HGB 12.3 02/20/2020   HCT 36.6 02/20/2020   PLT 250.0 02/20/2020   GLUCOSE 121 (H) 06/25/2020   CHOL 120 06/25/2020   TRIG 100.0 06/25/2020   HDL 45.90 06/25/2020   LDLCALC 54  06/25/2020   ALT 12 06/25/2020   AST 16 06/25/2020   NA 142 06/25/2020   K 4.2 06/25/2020   CL 105 06/25/2020   CREATININE 1.10 06/25/2020   BUN 18 06/25/2020   CO2 29 06/25/2020   TSH 2.46 02/20/2020   HGBA1C 6.2 06/25/2020    DG Chest 2 View  Result Date: 06/09/2020 CLINICAL DATA:  Persistent cough for 3 weeks EXAM: CHEST - 2 VIEW COMPARISON:  06/12/2019 FINDINGS: Cardiac shadow is stable. Lungs are well aerated bilaterally. No focal infiltrate or sizable effusion is seen. Previously seen nodular density in the right medial lung base is not well appreciated on this exam. No acute bony abnormality is seen. Stable density is noted over the midthoracic spine as previously described. This likely represents osteophytic change. IMPRESSION: No acute abnormality noted. Electronically Signed   By: Inez Catalina M.D.   On: 06/09/2020 22:16       Assessment & Plan:   Problem List Items Addressed This Visit    Cough    History of persistent chronic cough.  Continue saline nasal spray.  Using flovent bid.  Has albuterol if needed.  Seeing ENT.  Planning for CT head and sinus next week.  Continue treatment for reflux.  Adjust blood pressure medication as outlined.  CXR 06/09/20 - no acute abnormality.       Essential hypertension, benign    Blood pressure doing well.  Discussed ace inhibitor possibly contributing to persistent cough.  Unable to swallow previous medication and she restarted lisionopril/hctz.  Discussed can change hctz to 45m 1/2 tablet q day and avoid the 12.536mcapsule.  She is agreeable.  Will also start losartan 2553m day.  Follow pressures.  Follow metabolic panel.       Relevant Medications   hydrochlorothiazide (HYDRODIURIL) 25 MG tablet   losartan (COZAAR) 25 MG tablet   Other Relevant Orders   Basic metabolic panel   GERD (gastroesophageal reflux disease)  No acid reflux symptoms reported.  Continue nexium.       Hypercholesteremia    Continue crestor.  Low  cholesterol diet and exercise.  Follow lipid panel and liver function tests.       Relevant Medications   hydrochlorothiazide (HYDRODIURIL) 25 MG tablet   losartan (COZAAR) 25 MG tablet   Other Relevant Orders   Hepatic function panel   Lipid panel   Hyperglycemia    Low carb diet and exercise.  Follow met b and a1c.       Relevant Orders   Hemoglobin A1c   Mild depression (HCC)    Continue zoloft.  Appears to be stable.  Follow.       OSA (obstructive sleep apnea)    Just evaluated by pulmonary. Planning for auto cpap.  Follow.        Other Visit Diagnoses    Visit for screening mammogram    -  Primary   Relevant Orders   MM 3D SCREEN BREAST BILATERAL       Einar Pheasant, MD

## 2020-08-18 ENCOUNTER — Ambulatory Visit
Admission: RE | Admit: 2020-08-18 | Discharge: 2020-08-18 | Disposition: A | Discharge status: Home / Self Care | Payer: Medicare HMO | Source: Ambulatory Visit | Attending: Emergency Medicine | Admitting: Emergency Medicine

## 2020-08-18 VITALS — BP 117/76 | HR 105 | Temp 97.7°F | Resp 16 | Wt 226.0 lb

## 2020-08-18 DIAGNOSIS — R300 Dysuria: Secondary | ICD-10-CM | POA: Insufficient documentation

## 2020-08-18 LAB — POCT URINALYSIS DIP (MANUAL ENTRY)
Bilirubin, UA: NEGATIVE
Glucose, UA: NEGATIVE mg/dL
Ketones, POC UA: NEGATIVE mg/dL
Nitrite, UA: NEGATIVE
Protein Ur, POC: NEGATIVE mg/dL
Spec Grav, UA: 1.025 (ref 1.010–1.025)
Urobilinogen, UA: 0.2 E.U./dL
pH, UA: 6 (ref 5.0–8.0)

## 2020-08-18 MED ORDER — CEPHALEXIN 250 MG/5ML PO SUSR: 500.0000 mg | Freq: Two times a day (BID) | ORAL | 0 refills | Status: AC

## 2020-08-18 NOTE — ED Triage Notes (Addendum)
Patient presents to Urgent Care with complaints of increased urgency, dysuria x 4 days. Treating with pomegranate and cranberry juice. Pt states she sees urologist for freq UTIs.   Denies fever, hematuria, or abdominal pain,

## 2020-08-18 NOTE — Discharge Instructions (Signed)
Take the antibiotic as directed.  The urine culture is pending.  We will call you if it shows the need to change or discontinue your antibiotic.    Follow up with your primary care provider if your symptoms are not improving.    

## 2020-08-18 NOTE — ED Provider Notes (Signed)
Susan Miller    CSN: [id number] Arrival date & time: 08/18/20  1325      History   Chief Complaint Chief Complaint  Patient presents with  . Dysuria  . Appointment    1400    HPI Susan Miller is a 75 y.o. female.   Patient presents with 4-day history of dysuria and urinary urgency.  She denies fever, chills, hematuria, abdominal pain, back pain, pelvic pain, or other symptoms.  Treatment attempted at home with cranberry juice and pomegranate.  Patient is followed by urology for frequent UTIs.  Her medical history includes frequent UTIs, hypertension, depression.  The history is provided by the patient and medical records.    Past Medical History:  Diagnosis Date  . Allergy   . Depression   . History of chicken pox   . Hx: UTI (urinary tract infection)   . Hypertension     Patient Active Problem List   Diagnosis Date Noted  . OSA (obstructive sleep apnea) 04/18/2020  . Numbness and tingling of both feet 02/20/2020  . Daytime hypersomnolence 01/29/2020  . Dysuria 12/07/2019  . Snoring 12/07/2019  . Hypercholesteremia 10/09/2019  . Back pain 11/15/2018  . SOB (shortness of breath) 11/15/2018  . Decreased GFR 11/14/2018  . Hyperbilirubinemia 11/14/2018  . Abdominal bloating 12/09/2017  . Osteopenia 04/29/2017  . Hyperglycemia 04/27/2017  . Reflux esophagitis   . Heartburn   . Special screening for malignant neoplasms, colon   . Hair loss 10/26/2015  . Fatigue 10/21/2015  . Vitamin D deficiency 03/15/2015  . Weight loss counseling, encounter for 02/22/2015  . Facial erythema 07/27/2014  . Health care maintenance 07/05/2014  . BP (high blood pressure) 03/26/2014  . Headache 03/08/2014  . Eyelid lesion 03/08/2014  . Shingles 11/16/2013  . Light headedness 06/22/2013  . Cough 05/18/2013  . GERD (gastroesophageal reflux disease) 05/18/2013  . Swelling of extremity 03/01/2013  . Left knee pain 03/01/2013  . Essential hypertension, benign 12/15/2012   . Frequent UTI 12/15/2012  . Mild depression (HCC) 12/15/2012  . Environmental allergies 12/15/2012    Past Surgical History:  Procedure Laterality Date  . COLONOSCOPY WITH PROPOFOL N/A 06/20/2016   Procedure: COLONOSCOPY WITH PROPOFOL;  Surgeon: Midge Minium, MD;  Location: [facility] ENDOSCOPY;  Service: Endoscopy;  Laterality: N/A;  . ESOPHAGOGASTRODUODENOSCOPY (EGD) WITH PROPOFOL N/A 06/20/2016   Procedure: ESOPHAGOGASTRODUODENOSCOPY (EGD) WITH PROPOFOL;  Surgeon: Midge Minium, MD;  Location: [facility] ENDOSCOPY;  Service: Endoscopy;  Laterality: N/A;  . TONSILLECTOMY     as a child  . TUMOR REMOVAL      OB History   No obstetric history on file.      Home Medications    Prior to Admission medications   Medication Sig Start Date End Date Taking? Authorizing Provider  cephALEXin (KEFLEX) 250 MG/5ML suspension Take 10 mLs (500 mg total) by mouth 2 (two) times daily for 5 days. 08/18/20 08/23/20 Yes Mickie Bail, NP  albuterol (VENTOLIN HFA) 108 (90 Base) MCG/ACT inhaler INHALE 2 PUFFS INTO THE LUNGS EVERY 6 HOURS AS NEEDED FOR WHEEZING OR SHORTNESS OF BREATH 05/28/20   Dale Ehrenfeld, MD  azelastine (ASTELIN) 0.1 % nasal spray Place 2 sprays into both nostrils 2 (two) times daily. Use in each nostril as directed    [provider]  cholecalciferol (VITAMIN D3) 25 MCG (1000 UNIT) tablet Take by mouth daily. 2,000 units daily, taking a gummy    [provider]  conjugated estrogens (PREMARIN) vaginal cream Place 1  Applicatorful vaginally daily. Use pea sized amount M-W-Fr before bedtime 02/12/19   Vanna [place], MD  Cyanocobalamin (VITAMIN B-12 IJ) Inject as directed. Weekly for 3 weeks and then monthly    [provider]  esomeprazole (NEXIUM) 40 MG capsule TAKE 1 CAPSULE DAILY AT 12 NOON 06/28/20   Dale [place], MD  fluticasone (VERAMYST) 27.5 MCG/SPRAY nasal spray Place 2 sprays into the nose daily.    [provider]  Fluticasone Propionate, Inhal,  (FLOVENT IN) Inhale into the lungs.    [provider]  hydrochlorothiazide (HYDRODIURIL) 25 MG tablet Take 1/2 tablet q day 08/17/20   Dale Pocahontas, MD  losartan (COZAAR) 25 MG tablet Take 1 tablet (25 mg total) by mouth daily. 08/17/20   Dale [place], MD  rosuvastatin (CRESTOR) 10 MG tablet TAKE 1 TABLET EVERY DAY 06/28/20   Dale [place], MD  sertraline (ZOLOFT) 25 MG tablet TAKE 1 TABLET EVERY DAY 06/28/20   Dale [place], MD    Family History Family History  Problem Relation Age of Onset  . Hypertension Mother   . Cancer Sister        colon cancer  . Hypertension Sister   . Diabetes Sister   . Hypertension Brother   . Hypertension Maternal Aunt   . Breast cancer Maternal Aunt 60  . Hypertension Maternal Uncle   . Stroke Maternal Grandmother   . Hypertension Maternal Grandmother     Social History Social History   Tobacco Use  . Smoking status: Never Smoker  . Smokeless tobacco: Never Used  Vaping Use  . Vaping Use: Never used  Substance Use Topics  . Alcohol use: No    Alcohol/week: 0.0 standard drinks  . Drug use: No     Allergies   Codeine, Iodinated diagnostic agents, and Nitrofurantoin monohyd macro   Review of Systems Review of Systems  Constitutional: Negative for chills and fever.  HENT: Negative for ear pain and sore throat.   Eyes: Negative for pain and visual disturbance.  Respiratory: Negative for cough and shortness of breath.   Cardiovascular: Negative for chest pain and palpitations.  Gastrointestinal: Negative for abdominal pain and vomiting.  Genitourinary: Positive for dysuria and urgency. Negative for flank pain, hematuria, pelvic pain and vaginal discharge.  Musculoskeletal: Negative for arthralgias and back pain.  Skin: Negative for color change and rash.  Neurological: Negative for seizures and syncope.  All other systems reviewed and are negative.    Physical Exam Triage Vital Signs ED Triage Vitals  Enc Vitals  Group     BP      Pulse      Resp      Temp      Temp src      SpO2      Weight      Height      Head Circumference      Peak Flow      Pain Score      Pain Loc      Pain Edu?      Excl. in GC?    No data found.  Updated Vital Signs BP 117/76 (BP Location: Right Arm)   Pulse (!) 105   Temp 97.7 F (36.5 C) (Temporal)   Resp 16   Wt 226 lb (102.5 kg)   SpO2 95%   BMI 35.40 kg/m   Visual Acuity Right Eye Distance:   Left Eye Distance:   Bilateral Distance:    Right Eye Near:   Left Eye Near:  Bilateral Near:     Physical Exam Vitals and nursing note reviewed.  Constitutional:      General: She is not in acute distress.    Appearance: She is well-developed. She is not ill-appearing.  HENT:     Head: Normocephalic and atraumatic.     Mouth/Throat:     Mouth: Mucous membranes are moist.  Eyes:     Conjunctiva/sclera: Conjunctivae normal.  Cardiovascular:     Rate and Rhythm: Normal rate and regular rhythm.     Heart sounds: Normal heart sounds.  Pulmonary:     Effort: Pulmonary effort is normal. No respiratory distress.     Breath sounds: Normal breath sounds.  Abdominal:     Palpations: Abdomen is soft.     Tenderness: There is no abdominal tenderness. There is no right CVA tenderness, left CVA tenderness, guarding or rebound.  Musculoskeletal:     Cervical back: Neck supple.  Skin:    General: Skin is warm and dry.  Neurological:     General: No focal deficit present.     Mental Status: She is alert and oriented to person, place, and time.     Gait: Gait normal.  Psychiatric:        Mood and Affect: Mood normal.        Behavior: Behavior normal.      UC Treatments / Results  Labs (all labs ordered are listed, but only abnormal results are displayed) Labs Reviewed  POCT URINALYSIS DIP (MANUAL ENTRY) - Abnormal; Notable for the following components:      Result Value   Clarity, UA cloudy (*)    Blood, UA trace-intact (*)    Leukocytes, UA  Small (1+) (*)    All other components within normal limits  URINE CULTURE    EKG   Radiology No results found.  Procedures Procedures (including critical care time)  Medications Ordered in UC Medications - No data to display  Initial Impression / Assessment and Plan / UC Course  I have reviewed the triage vital signs and the nursing notes.  Pertinent labs & imaging results that were available during my care of the patient were reviewed by me and considered in my medical decision making (see chart for details).   Dysuria.  Urine culture pending.  Treating with Keflex.  Discussed with patient that we will call her if the culture shows the need to change or discontinue the antibiotic.  Instructed her to follow-up with her PCP or urologist if her symptoms are not improving.  She agrees to plan of care.   Final Clinical Impressions(s) / UC Diagnoses   Final diagnoses:  Dysuria     Discharge Instructions     Take the antibiotic as directed.  The urine culture is pending.  We will call you if it shows the need to change or discontinue your antibiotic.    Follow up with your primary care provider if your symptoms are not improving.       ED Prescriptions    Medication Sig Dispense Auth. Provider   cephALEXin (KEFLEX) 250 MG/5ML suspension Take 10 mLs (500 mg total) by mouth 2 (two) times daily for 5 days. 100 mL Mickie Bail, NP     PDMP not reviewed this encounter.   Mickie Bail, NP 08/18/20 [phone]

## 2020-08-20 LAB — URINE CULTURE: Culture: >=100000 — AB

## 2020-08-23 ENCOUNTER — Encounter: Payer: Self-pay | Admitting: Internal Medicine

## 2020-08-23 NOTE — Assessment & Plan Note (Signed)
Continue crestor.  Low cholesterol diet and exercise. Follow lipid panel and liver function tests.   

## 2020-08-23 NOTE — Assessment & Plan Note (Signed)
No acid reflux symptoms reported.  Continue nexium.  

## 2020-08-23 NOTE — Assessment & Plan Note (Addendum)
History of persistent chronic cough.  Continue saline nasal spray.  Using flovent bid.  Has albuterol if needed.  Seeing ENT.  Planning for CT head and sinus next week.  Continue treatment for reflux.  Adjust blood pressure medication as outlined.  CXR 06/09/20 - no acute abnormality.

## 2020-08-23 NOTE — Assessment & Plan Note (Signed)
Continue zoloft.  Appears to be stable.  Follow.   °

## 2020-08-23 NOTE — Assessment & Plan Note (Signed)
Low carb diet and exercise.  Follow met b and a1c.  

## 2020-08-23 NOTE — Assessment & Plan Note (Signed)
Blood pressure doing well.  Discussed ace inhibitor possibly contributing to persistent cough.  Unable to swallow previous medication and she restarted lisionopril/hctz.  Discussed can change hctz to 25mg  1/2 tablet q day and avoid the 12.5mg  capsule.  She is agreeable.  Will also start losartan 25mg  q day.  Follow pressures.  Follow metabolic panel.

## 2020-08-23 NOTE — Assessment & Plan Note (Signed)
Just evaluated by pulmonary. Planning for auto cpap.  Follow.

## 2020-08-24 ENCOUNTER — Ambulatory Visit
Admission: RE | Admit: 2020-08-24 | Discharge: 2020-08-24 | Disposition: A | Discharge status: Home / Self Care | Payer: Medicare HMO | Source: Ambulatory Visit | Attending: Otolaryngology | Admitting: Otolaryngology

## 2020-08-24 ENCOUNTER — Other Ambulatory Visit: Payer: Self-pay

## 2020-08-24 DIAGNOSIS — J324 Chronic pansinusitis: Secondary | ICD-10-CM | POA: Insufficient documentation

## 2020-08-24 DIAGNOSIS — J3489 Other specified disorders of nose and nasal sinuses: Secondary | ICD-10-CM | POA: Diagnosis not present

## 2020-08-24 DIAGNOSIS — J0110 Acute frontal sinusitis, unspecified: Secondary | ICD-10-CM | POA: Diagnosis not present

## 2020-08-24 DIAGNOSIS — J321 Chronic frontal sinusitis: Secondary | ICD-10-CM | POA: Diagnosis not present

## 2020-08-31 DIAGNOSIS — J301 Allergic rhinitis due to pollen: Secondary | ICD-10-CM | POA: Diagnosis not present

## 2020-09-01 ENCOUNTER — Other Ambulatory Visit: Payer: Self-pay

## 2020-09-01 ENCOUNTER — Ambulatory Visit (HOSPITAL_BASED_OUTPATIENT_CLINIC_OR_DEPARTMENT_OTHER): Payer: Medicare HMO | Attending: Internal Medicine | Admitting: Internal Medicine

## 2020-09-01 DIAGNOSIS — G4733 Obstructive sleep apnea (adult) (pediatric): Secondary | ICD-10-CM

## 2020-10-13 ENCOUNTER — Other Ambulatory Visit: Payer: Self-pay | Admitting: Internal Medicine

## 2020-11-15 ENCOUNTER — Other Ambulatory Visit: Payer: Self-pay | Admitting: Internal Medicine

## 2020-11-16 ENCOUNTER — Other Ambulatory Visit (INDEPENDENT_AMBULATORY_CARE_PROVIDER_SITE_OTHER): Payer: Medicare HMO

## 2020-11-16 ENCOUNTER — Other Ambulatory Visit: Payer: Self-pay

## 2020-11-16 DIAGNOSIS — R739 Hyperglycemia, unspecified: Secondary | ICD-10-CM

## 2020-11-16 DIAGNOSIS — E7800 Pure hypercholesterolemia, unspecified: Secondary | ICD-10-CM

## 2020-11-16 DIAGNOSIS — I10 Essential (primary) hypertension: Secondary | ICD-10-CM

## 2020-11-16 LAB — BASIC METABOLIC PANEL
BUN: 16 mg/dL (ref 6–23)
CO2: 27 mEq/L (ref 19–32)
Calcium: 9.2 mg/dL (ref 8.4–10.5)
Chloride: 106 mEq/L (ref 96–112)
Creatinine, Ser: 0.95 mg/dL (ref 0.40–1.20)
GFR: 58.94 mL/min — ABNORMAL LOW (ref >60.00)
Glucose, Bld: 114 mg/dL — ABNORMAL HIGH (ref 70–99)
Potassium: 3.6 mEq/L (ref 3.5–5.1)
Sodium: 141 mEq/L (ref 135–145)

## 2020-11-16 LAB — HEPATIC FUNCTION PANEL
ALT: 10 U/L (ref 0–35)
AST: 13 U/L (ref 0–37)
Albumin: 4.1 g/dL (ref 3.5–5.2)
Alkaline Phosphatase: 64 U/L (ref 39–117)
Bilirubin, Direct: 0.1 mg/dL (ref 0.0–0.3)
Total Bilirubin: 0.5 mg/dL (ref 0.2–1.2)
Total Protein: 6.7 g/dL (ref 6.0–8.3)

## 2020-11-16 LAB — LIPID PANEL
Cholesterol: 125 mg/dL (ref 0–200)
HDL: 43.8 mg/dL (ref >39.00)
LDL Cholesterol: 64 mg/dL (ref 0–99)
NonHDL: 81.36
Total CHOL/HDL Ratio: 3
Triglycerides: 89 mg/dL (ref 0.0–149.0)
VLDL: 17.8 mg/dL (ref 0.0–40.0)

## 2020-11-16 LAB — HEMOGLOBIN A1C: Hgb A1c MFr Bld: 6.3 % (ref 4.6–6.5)

## 2020-11-18 ENCOUNTER — Other Ambulatory Visit: Payer: Self-pay

## 2020-11-18 ENCOUNTER — Encounter: Payer: Medicare HMO | Admitting: Internal Medicine

## 2020-11-18 ENCOUNTER — Encounter: Payer: Self-pay | Admitting: Internal Medicine

## 2020-11-19 MED ORDER — HYDROCHLOROTHIAZIDE 25 MG PO TABS: ORAL_TABLET | ORAL | 1 refills | Status: DC

## 2020-11-19 MED ORDER — LOSARTAN POTASSIUM 25 MG PO TABS: ORAL_TABLET | ORAL | 1 refills | Status: DC

## 2020-11-19 NOTE — Telephone Encounter (Signed)
Medications have been refilled. Pt is aware that they were sent to [facility] order.   Appt that was available on 11/25/2020 is no longer available.

## 2020-11-19 NOTE — Telephone Encounter (Signed)
Ok to refill her medications.  Kidney function improved from previous check. Per message, she has been rescheduled to October.  I can see her next week  - we have opening 11/25/20.  Please schedule.  Thanks

## 2020-11-19 NOTE — Telephone Encounter (Signed)
I can see her 12/01/20 at 12:00.  Please schedule and notify her of appt date and time.

## 2020-11-22 NOTE — Telephone Encounter (Signed)
Patient aware and scheduled with Dr Lorin Picket on 7/28 at 2:00

## 2020-12-02 ENCOUNTER — Other Ambulatory Visit: Payer: Self-pay

## 2020-12-02 ENCOUNTER — Ambulatory Visit (INDEPENDENT_AMBULATORY_CARE_PROVIDER_SITE_OTHER): Payer: Medicare HMO | Admitting: Internal Medicine

## 2020-12-02 VITALS — BP 112/70 | HR 79 | Temp 97.8°F | Resp 16 | Ht 67.0 in | Wt 224.0 lb

## 2020-12-02 DIAGNOSIS — R944 Abnormal results of kidney function studies: Secondary | ICD-10-CM | POA: Diagnosis not present

## 2020-12-02 DIAGNOSIS — K219 Gastro-esophageal reflux disease without esophagitis: Secondary | ICD-10-CM | POA: Diagnosis not present

## 2020-12-02 DIAGNOSIS — R739 Hyperglycemia, unspecified: Secondary | ICD-10-CM | POA: Diagnosis not present

## 2020-12-02 DIAGNOSIS — L918 Other hypertrophic disorders of the skin: Secondary | ICD-10-CM | POA: Diagnosis not present

## 2020-12-02 DIAGNOSIS — E7800 Pure hypercholesterolemia, unspecified: Secondary | ICD-10-CM

## 2020-12-02 DIAGNOSIS — F320 Major depressive disorder, single episode, mild: Secondary | ICD-10-CM | POA: Diagnosis not present

## 2020-12-02 DIAGNOSIS — E538 Deficiency of other specified B group vitamins: Secondary | ICD-10-CM

## 2020-12-02 DIAGNOSIS — G4733 Obstructive sleep apnea (adult) (pediatric): Secondary | ICD-10-CM | POA: Diagnosis not present

## 2020-12-02 DIAGNOSIS — R200 Anesthesia of skin: Secondary | ICD-10-CM | POA: Diagnosis not present

## 2020-12-02 DIAGNOSIS — I10 Essential (primary) hypertension: Secondary | ICD-10-CM

## 2020-12-02 DIAGNOSIS — F32A Depression, unspecified: Secondary | ICD-10-CM

## 2020-12-02 DIAGNOSIS — E559 Vitamin D deficiency, unspecified: Secondary | ICD-10-CM

## 2020-12-02 DIAGNOSIS — R202 Paresthesia of skin: Secondary | ICD-10-CM

## 2020-12-02 NOTE — Progress Notes (Signed)
[name], female   DOB: 01/25/1946, 75 y.o.   MRN: [id number]   Subjective:    [name], female    DOB: 12-29-1945, 75 y.o.   MRN: [id number]  HPI This visit occurred during the SARS-CoV-2 public health emergency.  Safety protocols were in place, including screening questions prior to the visit, additional usage of staff PPE, and extensive cleaning of exam room while observing appropriate contact time as indicated for disinfecting solutions.   Patient here for scheduled follow up.  Here to follow-up regarding her blood pressure, cholesterol and increased stress.  Diagnosed with obstructive sleep apnea.  Recommended auto CPAP.  Blood pressure medication adjusted previously.  She was changed to ARB to see if this will help with cough.  Breathing stable.  No increased cough or congestion reported.  No chest pain.  Watching salt intake.  No abdominal pain or bowel change.  Request referral to Dr. Darrick Huntsman for evaluation skin tags.   Past Medical History:  Diagnosis Date   Allergy    Depression    History of chicken pox    Hx: UTI (urinary tract infection)    Hypertension    Past Surgical History:  Procedure Laterality Date   COLONOSCOPY WITH PROPOFOL N/A 06/20/2016   Procedure: COLONOSCOPY WITH PROPOFOL;  Surgeon: Lucilla Lame, MD;  Location: [facility] ENDOSCOPY;  Service: Endoscopy;  Laterality: N/A;   ESOPHAGOGASTRODUODENOSCOPY (EGD) WITH PROPOFOL N/A 06/20/2016   Procedure: ESOPHAGOGASTRODUODENOSCOPY (EGD) WITH PROPOFOL;  Surgeon: Lucilla Lame, MD;  Location: [facility] ENDOSCOPY;  Service: Endoscopy;  Laterality: N/A;   TONSILLECTOMY     as a child   TUMOR REMOVAL     Family History  Problem Relation Age of Onset   Hypertension Mother    Cancer Sister        colon cancer   Hypertension Sister    Diabetes Sister    Hypertension Brother    Hypertension Maternal Aunt    Breast cancer Maternal Aunt 27   Hypertension Maternal Uncle    Stroke Maternal  Grandmother    Hypertension Maternal Grandmother    Social History   Socioeconomic History   Marital status: Divorced    Spouse name: Not on file   Number of children: Not on file   Years of education: Not on file   Highest education level: Not on file  Occupational History   Not on file  Tobacco Use   Smoking status: Never   Smokeless tobacco: Never  Vaping Use   Vaping Use: Never used  Substance and Sexual Activity   Alcohol use: No    Alcohol/week: 0.0 standard drinks   Drug use: No   Sexual activity: Not Currently  Other Topics Concern   Not on file  Social History Narrative   Not on file   Social Determinants of Health   Financial Resource Strain: Low Risk    Difficulty of Paying Living Expenses: Not hard at all  Food Insecurity: No Food Insecurity   Worried About Charity fundraiser in the Last Year: Never true   [place] in the Last Year: Never true  Transportation Needs: No Transportation Needs   Lack of Transportation (Medical): No   Lack of Transportation (Non-Medical): No  Physical Activity: Unknown   Days of Exercise per Week: 0 days   Minutes of Exercise per Session: Not on file  Stress: No Stress Concern Present   Feeling of Stress : Not at all  Social  Connections: Unknown   Frequency of Communication with Friends and Family: More than three times a week   Frequency of Social Gatherings with Friends and Family: More than three times a week   Attends Religious Services: Not on Electrical engineer or Organizations: Not on file   Attends Archivist Meetings: Not on file   Marital Status: Not on file    Review of Systems  Constitutional:  Negative for appetite change and unexpected weight change.  HENT:  Negative for congestion and sinus pressure.   Respiratory:  Negative for cough, chest tightness and shortness of breath.   Cardiovascular:  Negative for chest pain, palpitations and leg swelling.  Gastrointestinal:   Negative for abdominal pain, diarrhea, nausea and vomiting.  Genitourinary:  Negative for difficulty urinating and dysuria.  Musculoskeletal:  Negative for joint swelling and myalgias.  Skin:  Negative for color change and rash.  Neurological:  Negative for dizziness, light-headedness and headaches.  Psychiatric/Behavioral:  Negative for agitation and dysphoric mood.       Objective:    Physical Exam Vitals reviewed.  Constitutional:      General: She is not in acute distress.    Appearance: Normal appearance.  HENT:     Head: Normocephalic and atraumatic.     Right Ear: External ear normal.     Left Ear: External ear normal.  Eyes:     General: No scleral icterus.       Right eye: No discharge.        Left eye: No discharge.     Conjunctiva/sclera: Conjunctivae normal.  Neck:     Thyroid: No thyromegaly.  Cardiovascular:     Rate and Rhythm: Normal rate and regular rhythm.  Pulmonary:     Effort: No respiratory distress.     Breath sounds: Normal breath sounds. No wheezing.  Abdominal:     General: Bowel sounds are normal.     Palpations: Abdomen is soft.     Tenderness: There is no abdominal tenderness.  Musculoskeletal:        General: No swelling or tenderness.     Cervical back: Neck supple. No tenderness.  Lymphadenopathy:     Cervical: No cervical adenopathy.  Skin:    Findings: No erythema or rash.  Neurological:     Mental Status: She is alert.  Psychiatric:        Mood and Affect: Mood normal.        Behavior: Behavior normal.    BP 112/70   Pulse 79   Temp 97.8 F (36.6 C)   Resp 16   Ht '5\' 7"'  (1.702 m)   Wt 224 lb (101.6 kg)   SpO2 98%   BMI 35.08 kg/m  Wt Readings from Last 3 Encounters:  12/02/20 224 lb (101.6 kg)  08/18/20 226 lb (102.5 kg)  08/17/20 223 lb 9.6 oz (101.4 kg)    Outpatient Encounter Medications as of 12/02/2020  Medication Sig   conjugated estrogens (PREMARIN) vaginal cream Place 1 Applicatorful vaginally daily. Use pea  sized amount M-W-Fr before bedtime   Cyanocobalamin (VITAMIN B-12 IJ) Inject as directed. Weekly for 3 weeks and then monthly   esomeprazole (NEXIUM) 40 MG capsule TAKE 1 CAPSULE DAILY AT 12 NOON   hydrochlorothiazide (HYDRODIURIL) 25 MG tablet Take 1/2 tablet q day   losartan (COZAAR) 25 MG tablet TAKE 1 TABLET(25 MG) BY MOUTH DAILY   rosuvastatin (CRESTOR) 10 MG tablet TAKE 1 TABLET EVERY DAY  sertraline (ZOLOFT) 25 MG tablet TAKE 1 TABLET EVERY DAY   [DISCONTINUED] albuterol (VENTOLIN HFA) 108 (90 Base) MCG/ACT inhaler INHALE 2 PUFFS INTO THE LUNGS EVERY 6 HOURS AS NEEDED FOR WHEEZING OR SHORTNESS OF BREATH   [DISCONTINUED] azelastine (ASTELIN) 0.1 % nasal spray Place 2 sprays into both nostrils 2 (two) times daily. Use in each nostril as directed   [DISCONTINUED] cholecalciferol (VITAMIN D3) 25 MCG (1000 UNIT) tablet Take by mouth daily. 2,000 units daily, taking a gummy   [DISCONTINUED] fluticasone (VERAMYST) 27.5 MCG/SPRAY nasal spray Place 2 sprays into the nose daily.   [DISCONTINUED] Fluticasone Propionate, Inhal, (FLOVENT IN) Inhale into the lungs.   No facility-administered encounter medications on file as of 12/02/2020.     Lab Results  Component Value Date   WBC 7.3 02/20/2020   HGB 12.3 02/20/2020   HCT 36.6 02/20/2020   PLT 250.0 02/20/2020   GLUCOSE 114 (H) 11/16/2020   CHOL 125 11/16/2020   TRIG 89.0 11/16/2020   HDL 43.80 11/16/2020   LDLCALC 64 11/16/2020   ALT 10 11/16/2020   AST 13 11/16/2020   NA 141 11/16/2020   K 3.6 11/16/2020   CL 106 11/16/2020   CREATININE 0.95 11/16/2020   BUN 16 11/16/2020   CO2 27 11/16/2020   TSH 2.46 02/20/2020   HGBA1C 6.3 11/16/2020    CT MAXILLOFACIAL WO CONTRAST  Result Date: 08/24/2020 CLINICAL DATA:  Sinus congestion and drainage over the last 4 months. EXAM: CT MAXILLOFACIAL WITHOUT CONTRAST TECHNIQUE: Multidetector CT images of the paranasal sinuses were obtained using the standard protocol without intravenous  contrast. COMPARISON:  MRI 09/09/2010 FINDINGS: Paranasal sinuses: Frontal: Normally aerated. Patent frontal sinus drainage pathways. Ethmoid: Normally aerated. Maxillary: Normally aerated. Sphenoid: Normally aerated. Patent sphenoethmoidal recesses. Right ostiomeatal unit: Patent. Left ostiomeatal unit: Patent. Nasal passages: Patent. Nasal septum bows 4 mm towards the right with a right spur. Anatomy: No pneumatization superior to anterior ethmoid notches. Symmetric and intact olfactory grooves and fovea ethmoidalis, Keros III (8-56m). Sellar sphenoid pneumatization pattern. No dehiscence of carotid or optic canals. No onodi cell. Other: None IMPRESSION: Normally aerated paranasal sinuses. Patent sinus drainage pathways. Nasal septum bows 4 mm towards the right with a right spur. Electronically Signed   By: MNelson ChimesM.D.   On: 08/24/2020 13:32       Assessment & Plan:   Problem List Items Addressed This Visit     B12 deficiency    Receiving B12 replacement.  Check B12 level with next labs.       Relevant Orders   Vitamin B12   Cutaneous skin tags    Noted under breast.  Refer to dermatology for evaluation.       Relevant Orders   Ambulatory referral to Dermatology   Decreased GFR    GFR on recent check 58.9.  Improved.  Continue to avoid anti-inflammatories.  Follow.   Stay hydrated.       Essential hypertension, benign    Doing well on losartan and hctz.  Continue current medication regimen.  Follow pressures.  Follow metabolic panel.       Relevant Orders   Basic metabolic panel   GERD (gastroesophageal reflux disease)    No acid reflux symptoms reported.  Continue nexium.        Hypercholesteremia    Continue crestor.  Low cholesterol diet and exercise.  Follow lipid panel and liver function tests.        Relevant Orders   Basic metabolic panel  Hepatic function panel   Lipid panel   CBC with Differential/Platelet   Lipid panel   TSH   Hepatic function  panel   Hyperglycemia - Primary    Low carb diet and exercise.  Follow met b and a1c.        Relevant Orders   Hemoglobin A1c   Hemoglobin A1c   Mild depression (HCC)    Discussed.  Overall she feels she is handling things relatively well.  Does not feel she needs any further invention at this time.  Continue Zoloft.       Numbness and tingling of both feet    Nerve conduction studies revealed generalized sensory polyneuropathy.  Continue B12 supplementation.       OSA (obstructive sleep apnea)    Evaluated by pulmonary.  Diagnosed with mild obstructive sleep apnea.  Recommended auto CPAP.       Vitamin D deficiency    Follow vitamin D level.        Relevant Orders   VITAMIN D 25 Hydroxy (Vit-D Deficiency, Fractures)     Einar Pheasant, MD

## 2020-12-12 ENCOUNTER — Encounter: Payer: Self-pay | Admitting: Internal Medicine

## 2020-12-12 DIAGNOSIS — E538 Deficiency of other specified B group vitamins: Secondary | ICD-10-CM | POA: Insufficient documentation

## 2020-12-12 DIAGNOSIS — L918 Other hypertrophic disorders of the skin: Secondary | ICD-10-CM | POA: Insufficient documentation

## 2020-12-12 NOTE — Assessment & Plan Note (Signed)
Doing well on losartan and hctz.  Continue current medication regimen.  Follow pressures.  Follow metabolic panel.

## 2020-12-12 NOTE — Assessment & Plan Note (Signed)
Low carb diet and exercise.  Follow met b and a1c.  

## 2020-12-12 NOTE — Assessment & Plan Note (Signed)
GFR on recent check 58.9.  Improved.  Continue to avoid anti-inflammatories.  Follow.   Stay hydrated.

## 2020-12-12 NOTE — Assessment & Plan Note (Signed)
Receiving B12 replacement.  Check B12 level with next labs.

## 2020-12-12 NOTE — Assessment & Plan Note (Signed)
Noted under breast.  Refer to dermatology for evaluation.

## 2020-12-12 NOTE — Assessment & Plan Note (Signed)
No acid reflux symptoms reported.  Continue nexium.  

## 2020-12-12 NOTE — Assessment & Plan Note (Signed)
Evaluated by pulmonary.  Diagnosed with mild obstructive sleep apnea.  Recommended auto CPAP.

## 2020-12-12 NOTE — Assessment & Plan Note (Signed)
Nerve conduction studies revealed generalized sensory polyneuropathy.  Continue B12 supplementation.

## 2020-12-12 NOTE — Assessment & Plan Note (Signed)
Follow vitamin D level.  

## 2020-12-12 NOTE — Assessment & Plan Note (Signed)
Discussed.  Overall she feels she is handling things relatively well.  Does not feel she needs any further invention at this time.  Continue Zoloft.

## 2020-12-12 NOTE — Assessment & Plan Note (Signed)
Continue crestor.  Low cholesterol diet and exercise. Follow lipid panel and liver function tests.   

## 2020-12-16 ENCOUNTER — Other Ambulatory Visit: Payer: Self-pay

## 2020-12-16 ENCOUNTER — Ambulatory Visit
Admission: RE | Admit: 2020-12-16 | Discharge: 2020-12-16 | Disposition: A | Discharge status: Home / Self Care | Payer: Medicare HMO | Source: Ambulatory Visit | Attending: Internal Medicine | Admitting: Internal Medicine

## 2020-12-16 DIAGNOSIS — Z1231 Encounter for screening mammogram for malignant neoplasm of breast: Secondary | ICD-10-CM | POA: Diagnosis not present

## 2020-12-18 ENCOUNTER — Ambulatory Visit: Payer: Medicare HMO

## 2020-12-18 ENCOUNTER — Telehealth: Payer: Medicare HMO | Admitting: Nurse Practitioner

## 2020-12-18 ENCOUNTER — Encounter: Payer: Self-pay | Admitting: Nurse Practitioner

## 2020-12-18 DIAGNOSIS — U071 COVID-19: Secondary | ICD-10-CM

## 2020-12-18 MED ORDER — MOLNUPIRAVIR EUA 200MG CAPSULE: 4.0000 | ORAL_CAPSULE | Freq: Two times a day (BID) | ORAL | 0 refills | Status: AC

## 2020-12-18 NOTE — Patient Instructions (Signed)
You are being prescribed MOLNUPIRAVIR for COVID-19 infection.      Please pick up your prescription at: [facility] pharmacy called into [facility]   Please call the pharmacy or go through the drive through vs going inside if you are picking up the mediation yourself to prevent further spread. If prescribed to a [facility] affiliated pharmacy, a pharmacist will bring the medication out to your car.   ADMINISTRATION INSTRUCTIONS: Take with or without food. Swallow the tablets whole. Don't chew, crush, or break the medications because it might not work as well  For each dose of the medication, you should be taking FOUR tablets at one time, TWICE a day   Finish your full five-day course of Molnupiravir even if you feel better before you're done. Stopping this medication too early can make it less effective to prevent severe illness related to COVID19.    Molnupiravir is prescribed for YOU ONLY. Don't share it with others, even if they have similar symptoms as you. This medication might not be right for everyone.   Make sure to take steps to protect yourself and others while you're taking this medication in order to get well soon and to prevent others from getting sick with COVID-19.   **If you are of childbearing potential (any gender) - it is advised to not get pregnant while taking this medication and recommended that condoms are used for female partners the next 3 months after taking the medication out of extreme caution    COMMON SIDE EFFECTS: Diarrhea Nausea  Dizziness    If your COVID-19 symptoms get worse, get medical help right away. Call 911 if you experience symptoms such as worsening cough, trouble breathing, chest pain that doesn't go away, confusion, a hard time staying awake, and pale or blue-colored skin. This medication won't prevent all COVID-19 cases from getting worse.

## 2020-12-18 NOTE — Progress Notes (Signed)
Virtual Visit Consent   Susan Miller, you are scheduled for a virtual visit with Mary-Margaret Daphine Deutscher, FNP, a [facility] provider, today.     Just as with appointments in the office, your consent must be obtained to participate.  Your consent will be active for this visit and any virtual visit you may have with one of our providers in the next 365 days.     If you have a MyChart account, a copy of this consent can be sent to you electronically.  All virtual visits are billed to your insurance company just like a traditional visit in the office.    As this is a virtual visit, video technology does not allow for your provider to perform a traditional examination.  This may limit your provider's ability to fully assess your condition.  If your provider identifies any concerns that need to be evaluated in person or the need to arrange testing (such as labs, EKG, etc.), we will make arrangements to do so.     Although advances in technology are sophisticated, we cannot ensure that it will always work on either your end or our end.  If the connection with a video visit is poor, the visit may have to be switched to a telephone visit.  With either a video or telephone visit, we are not always able to ensure that we have a secure connection.     I need to obtain your verbal consent now.   Are you willing to proceed with your visit today? YES   Susan Miller has provided verbal consent on 12/18/2020 for a virtual visit (video or telephone).   Mary-Margaret Daphine Deutscher, FNP   Date: 12/18/2020 7:29 PM   Virtual Visit via Video Note   I, Mary-Margaret Daphine Deutscher, connected with Susan Miller, 13-May-1945) on 12/18/20 at  7:45 PM EDT by a video-enabled telemedicine application and verified that I am speaking with the correct person using two identifiers.  Location: Patient: Virtual Visit Location Patient: Home Provider: Virtual Visit Location Provider: Mobile   I discussed the limitations of  evaluation and management by telemedicine and the availability of in person appointments. The patient expressed understanding and agreed to proceed.    History of Present Illness: Susan Miller is a 75 y.o. who identifies as a female who was assigned female at birth, and is being seen today for covid positive.  HPI: Patient went to urgent care [facility] today for covid test but they told her her rsults would not be back for 5 days, so she left. She says that she woke up with headache, chills, sweating , ciugh and congestion. She stopped on way home from [facility] and got a home covid test which was positive. Her son was dx Thursday with covid.    Review of Systems  Constitutional:  Positive for chills and fever.  HENT:  Positive for congestion. Negative for ear discharge, ear pain, sinus pain and sore throat.   Respiratory:  Positive for cough and sputum production. Negative for shortness of breath.   Musculoskeletal:  Positive for myalgias.  Neurological:  Positive for headaches.   Problems:  Patient Active Problem List   Diagnosis Date Noted   Cutaneous skin tags 12/12/2020   B12 deficiency 12/12/2020   OSA (obstructive sleep apnea) 04/18/2020   Numbness and tingling of both feet 02/20/2020   Daytime hypersomnolence 01/29/2020   Dysuria 12/07/2019   Snoring 12/07/2019   Hypercholesteremia 10/09/2019   Back pain 11/15/2018  SOB (shortness of breath) 11/15/2018   Decreased GFR 11/14/2018   Hyperbilirubinemia 11/14/2018   Abdominal bloating 12/09/2017   Osteopenia 04/29/2017   Hyperglycemia 04/27/2017   Reflux esophagitis    Heartburn    Special screening for malignant neoplasms, colon    Hair loss 10/26/2015   Fatigue 10/21/2015   Vitamin D deficiency 03/15/2015   Weight loss counseling, encounter for 02/22/2015   Facial erythema 07/27/2014   Health care maintenance 07/05/2014   BP (high blood pressure) 03/26/2014   Headache 03/08/2014   Eyelid lesion 03/08/2014   Shingles  11/16/2013   Light headedness 06/22/2013   Cough 05/18/2013   GERD (gastroesophageal reflux disease) 05/18/2013   Swelling of extremity 03/01/2013   Left knee pain 03/01/2013   Essential hypertension, benign 12/15/2012   Frequent UTI 12/15/2012   Mild depression (HCC) 12/15/2012   Environmental allergies 12/15/2012    Allergies:  Allergies  Allergen Reactions   Codeine Hives   Iodinated Diagnostic Agents Swelling   Nitrofurantoin Monohyd Macro Nausea Only   Medications:  Current Outpatient Medications:    conjugated estrogens (PREMARIN) vaginal cream, Place 1 Applicatorful vaginally daily. Use pea sized amount M-W-Fr before bedtime, Disp: 42.5 g, Rfl: 12   Cyanocobalamin (VITAMIN B-12 IJ), Inject as directed. Weekly for 3 weeks and then monthly, Disp: , Rfl:    esomeprazole (NEXIUM) 40 MG capsule, TAKE 1 CAPSULE DAILY AT 12 NOON, Disp: 90 capsule, Rfl: 0   hydrochlorothiazide (HYDRODIURIL) 25 MG tablet, Take 1/2 tablet q day, Disp: 45 tablet, Rfl: 1   losartan (COZAAR) 25 MG tablet, TAKE 1 TABLET(25 MG) BY MOUTH DAILY, Disp: 90 tablet, Rfl: 1   rosuvastatin (CRESTOR) 10 MG tablet, TAKE 1 TABLET EVERY DAY, Disp: 90 tablet, Rfl: 0   sertraline (ZOLOFT) 25 MG tablet, TAKE 1 TABLET EVERY DAY, Disp: 90 tablet, Rfl: 1  Observations/Objective: Patient is well-developed, well-nourished in no acute distress.  Resting comfortably  at home.  Head is normocephalic, atraumatic.  No labored breathing.  Speech is clear and coherent with logical content.  Patient is alert and oriented at baseline.  Voice hoarse Slight cough Rubbing forehead during visit.  Assessment and Plan:  Susan Miller in today with chief complaint of Covid Positive   1. Lab test positive for detection of COVID-19 virus 1. Take meds as prescribed 2. Use a cool mist humidifier especially during the winter months and when heat has been humid. 3. Use saline nose sprays frequently 4. Saline irrigations of the nose  can be very helpful if done frequently.  * 4X daily for 1 week*  * Use of a nettie pot can be helpful with this. Follow directions with this* 5. Drink plenty of fluids 6. Keep thermostat turn down low 7.For any cough or congestion  Use plain Mucinex- regular strength or max strength is fine   * Children- consult with Pharmacist for dosing 8. For fever or aces or pains- take tylenol or ibuprofen appropriate for age and weight.  * for fevers greater than 101 orally you may alternate ibuprofen and tylenol every  3 hours.   Meds ordered this encounter  Medications   molnupiravir EUA 200 mg CAPS    Sig: Take 4 capsules (800 mg total) by mouth 2 (two) times daily for 5 days.    Dispense:  40 capsule    Refill:  0    Order Specific Question:   Supervising Provider    Answer:   Eber Hong [[id number]]  Follow Up Instructions: I discussed the assessment and treatment plan with the patient. The patient was provided an opportunity to ask questions and all were answered. The patient agreed with the plan and demonstrated an understanding of the instructions.  A copy of instructions were sent to the patient via MyChart.  The patient was advised to call back or seek an in-person evaluation if the symptoms worsen or if the condition fails to improve as anticipated.  Time:  I spent 10 minutes with the patient via telehealth technology discussing the above problems/concerns.    Mary-Margaret Daphine Deutscher, FNP

## 2020-12-22 ENCOUNTER — Encounter: Payer: Self-pay | Admitting: Internal Medicine

## 2020-12-23 NOTE — Telephone Encounter (Signed)
Patient aware of below and will call back if OTC treatment does not work.

## 2020-12-23 NOTE — Telephone Encounter (Signed)
She can use nasacort nasal spray - 2 sprays each nostril one time per day.  Also, if nasal congestion - robitussin or mucinex.  If cough - delsym cough syrup.  If needs or wants an appt can work in for virtual appt.

## 2020-12-23 NOTE — Telephone Encounter (Signed)
Dx with covid on Saturday. Prescribed oral antiviral medication. Patient is wondering what she can use for allergy symptoms (itchy watery eyes, sneezing, etc) Denies sob, chest tightness, etc. She has one day left of her oral antiviral medication and is feeling much better. She just wanted to confirm what she could use OTC.

## 2021-01-04 ENCOUNTER — Encounter: Payer: Self-pay | Admitting: Internal Medicine

## 2021-01-06 ENCOUNTER — Telehealth: Payer: Medicare HMO | Admitting: Family Medicine

## 2021-01-06 ENCOUNTER — Encounter: Payer: Self-pay | Admitting: Family Medicine

## 2021-01-06 DIAGNOSIS — J0100 Acute maxillary sinusitis, unspecified: Secondary | ICD-10-CM | POA: Diagnosis not present

## 2021-01-06 DIAGNOSIS — R059 Cough, unspecified: Secondary | ICD-10-CM | POA: Diagnosis not present

## 2021-01-06 MED ORDER — BENZONATATE 100 MG PO CAPS: 100.0000 mg | ORAL_CAPSULE | Freq: Two times a day (BID) | ORAL | 0 refills | Status: DC | PRN

## 2021-01-06 MED ORDER — AZITHROMYCIN 250 MG PO TABS: ORAL_TABLET | ORAL | 0 refills | Status: AC

## 2021-01-06 NOTE — Telephone Encounter (Signed)
Patient did virtual visit and started abx.

## 2021-01-06 NOTE — Progress Notes (Signed)
[name], Susan Miller are scheduled for a virtual visit with your provider today.    Just as we do with appointments in the office, we must obtain your consent to participate.  Your consent will be active for this visit and any virtual visit you may have with one of our providers in the next 365 days.    If you have a MyChart account, I can also send a copy of this consent to you electronically.  All virtual visits are billed to your insurance company just like a traditional visit in the office.  As this is a virtual visit, video technology does not allow for your provider to perform a traditional examination.  This may limit your provider's ability to fully assess your condition.  If your provider identifies any concerns that need to be evaluated in person or the need to arrange testing such as labs, EKG, etc, we will make arrangements to do so.    Although advances in technology are sophisticated, we cannot ensure that it will always work on either your end or our end.  If the connection with a video visit is poor, we may have to switch to a telephone visit.  With either a video or telephone visit, we are not always able to ensure that we have a secure connection.   I need to obtain your verbal consent now.   Are you willing to proceed with your visit today?   Susan Miller has provided verbal consent on 01/06/2021 for a virtual visit (video or telephone).   Susan Finner, NP 01/06/2021  10:32 AM   Date:  01/06/2021   ID:  Susan Miller, DOB 10/28/1945, MRN [id number]  Patient Location: Home Provider Location: Home Office   Participants: Patient and Provider for Visit and Wrap up  Method of visit: Video  Location of Patient: Home Location of Provider: Home Office Consent was obtain for visit over the video. Services rendered by provider: Visit was performed via video  A video enabled telemedicine application was used and I verified that I am speaking with the correct person using two  identifiers.  PCP:  Dale Bevington, MD   Chief Complaint:  sinus infection symptoms   History of Present Illness:    Susan Miller is a 75 y.o. female with history as stated below. Presents video telehealth for an acute care visit due to new onset sinus symptoms post covid on 12/18/20. She had Covid on August 13, took meds- molnupiravir.   Started around Sunday 8/28 is having stuffy head-headache, coughing spells especially bad at night, itchy ears, too right teeth hurting, face reported to look swollen under right eye.  Denies having fevers, chills, shortness of breath, chest pain, ear pain, sore throat or exposure to covid or other sick contacts. Modifying factors include: tylenol  No other aggravating or relieving factors.  No other c/o.   Past Medical, Surgical, Social History, Allergies, and Medications have been Reviewed.  Past Medical History:  Diagnosis Date   Allergy    Depression    History of chicken pox    Hx: UTI (urinary tract infection)    Hypertension     No outpatient medications have been marked as taking for the 01/06/21 encounter (Appointment) with [facility] PROVIDER.     Allergies:   Codeine, Iodinated diagnostic agents, and Nitrofurantoin monohyd macro   ROS See HPI for history of present illness.  Physical Exam Constitutional:      Appearance: Normal appearance.  HENT:  Head: Normocephalic.  Eyes:     Conjunctiva/sclera: Conjunctivae normal.  Pulmonary:     Effort: Pulmonary effort is normal.     Comments: Mild cough appreciated  Musculoskeletal:        General: Normal range of motion.     Cervical back: Normal range of motion.  Neurological:     Mental Status: She is alert and oriented to person, place, and time.  Psychiatric:        Mood and Affect: Mood normal.        Behavior: Behavior normal.        Thought Content: Thought content normal.        Judgment: Judgment normal.              A&P  1. Acute non-recurrent  maxillary sinusitis Secondary infection post covid -repeat covid test recommended Zpak for sinus infection OTC measures reviewed   Reviewed side effects, risks and benefits of medication.   Patient acknowledged agreement and understanding of the plan.    - azithromycin (ZITHROMAX) 250 MG tablet; Take 2 tablets on day 1, then 1 tablet daily on days 2 through 5  Dispense: 6 tablet; Refill: 0 - benzonatate (TESSALON) 100 MG capsule; Take 1 capsule (100 mg total) by mouth 2 (two) times daily as needed for cough.  Dispense: 20 capsule; Refill: 0  2. Cough Cough is worse symptom Perles provided   Reviewed side effects, risks and benefits of medication.   Patient acknowledged agreement and understanding of the plan.    - benzonatate (TESSALON) 100 MG capsule; Take 1 capsule (100 mg total) by mouth 2 (two) times daily as needed for cough.  Dispense: 20 capsule; Refill: 0  I discussed the assessment and treatment plan with the patient. The patient was provided an opportunity to ask questions and all were answered. The patient agreed with the plan and demonstrated an understanding of the instructions.   The patient was advised to call back or seek an in-person evaluation if the symptoms worsen or if the condition fails to improve as anticipated.   The above assessment and management plan was discussed with the patient. The patient verbalized understanding of and has agreed to the management plan. Patient is aware to call the clinic if symptoms persist or worsen. Patient is aware when to return to the clinic for a follow-up visit. Patient educated on when it is appropriate to go to the emergency department.    Time:   Today, I have spent 10 minutes with the patient with telehealth technology discussing the above problems, reviewing the chart, previous notes, medications and orders.   Medication Changes: No orders of the defined types were placed in this encounter.    Disposition:  Follow  up PCP Signed, Susan Finner, NP  01/06/2021 10:32 AM

## 2021-01-06 NOTE — Patient Instructions (Addendum)
I appreciate the opportunity to provide you with care for your health and wellness.  Please take all your medication in full.  Consider a repeat COVID test - if positive please stay home for 5 days from symptoms and are without symptoms and fever.   Please continue to practice social distancing to keep you, your family, and our community safe.  If you must go out, please wear a mask and practice good handwashing.  Have a wonderful day. With Gratitude, Tereasa Coop, DNP, AGNP-BC

## 2021-01-12 ENCOUNTER — Encounter: Payer: Self-pay | Admitting: Internal Medicine

## 2021-01-12 ENCOUNTER — Other Ambulatory Visit: Payer: Self-pay | Admitting: Internal Medicine

## 2021-01-13 NOTE — Telephone Encounter (Signed)
If persistent symptoms, despite medication - needs reevaluation.  If acute symptoms - acute care.  If agreeable, can place referral to ENT

## 2021-01-13 NOTE — Telephone Encounter (Signed)
Placed call to pt. Pt states shes just still having a lot of pressure and congestion in her head. Pt reports no other symptoms. Please advise.

## 2021-01-19 DIAGNOSIS — G4733 Obstructive sleep apnea (adult) (pediatric): Secondary | ICD-10-CM | POA: Diagnosis not present

## 2021-01-26 DIAGNOSIS — J0101 Acute recurrent maxillary sinusitis: Secondary | ICD-10-CM | POA: Diagnosis not present

## 2021-02-11 DIAGNOSIS — R208 Other disturbances of skin sensation: Secondary | ICD-10-CM | POA: Diagnosis not present

## 2021-02-11 DIAGNOSIS — L538 Other specified erythematous conditions: Secondary | ICD-10-CM | POA: Diagnosis not present

## 2021-02-11 DIAGNOSIS — L820 Inflamed seborrheic keratosis: Secondary | ICD-10-CM | POA: Diagnosis not present

## 2021-02-18 DIAGNOSIS — G4733 Obstructive sleep apnea (adult) (pediatric): Secondary | ICD-10-CM | POA: Diagnosis not present

## 2021-02-21 ENCOUNTER — Encounter: Payer: Medicare HMO | Admitting: Internal Medicine

## 2021-03-01 DIAGNOSIS — B9689 Other specified bacterial agents as the cause of diseases classified elsewhere: Secondary | ICD-10-CM | POA: Diagnosis not present

## 2021-03-01 DIAGNOSIS — J019 Acute sinusitis, unspecified: Secondary | ICD-10-CM | POA: Diagnosis not present

## 2021-03-11 ENCOUNTER — Other Ambulatory Visit: Payer: Self-pay | Admitting: Internal Medicine

## 2021-03-18 ENCOUNTER — Telehealth: Payer: Self-pay | Admitting: Internal Medicine

## 2021-03-18 ENCOUNTER — Other Ambulatory Visit: Payer: Self-pay

## 2021-03-18 ENCOUNTER — Telehealth (INDEPENDENT_AMBULATORY_CARE_PROVIDER_SITE_OTHER): Payer: Medicare HMO | Admitting: Internal Medicine

## 2021-03-18 DIAGNOSIS — R300 Dysuria: Secondary | ICD-10-CM | POA: Diagnosis not present

## 2021-03-18 DIAGNOSIS — I10 Essential (primary) hypertension: Secondary | ICD-10-CM

## 2021-03-18 DIAGNOSIS — N3000 Acute cystitis without hematuria: Secondary | ICD-10-CM

## 2021-03-18 LAB — POCT URINALYSIS DIPSTICK
Bilirubin, UA: NEGATIVE
Glucose, UA: NEGATIVE
Ketones, UA: NEGATIVE
Nitrite, UA: NEGATIVE
Protein, UA: NEGATIVE
Spec Grav, UA: 1.02 (ref 1.010–1.025)
Urobilinogen, UA: 0.2 E.U./dL
pH, UA: 7 (ref 5.0–8.0)

## 2021-03-18 MED ORDER — CEPHALEXIN 250 MG/5ML PO SUSR: 500.0000 mg | Freq: Two times a day (BID) | ORAL | 0 refills | Status: DC

## 2021-03-18 NOTE — Progress Notes (Signed)
[name], female   DOB: 1945/10/31, 75 y.o.   MRN: [id number]   Virtual Visit via telephone Note  This visit type was conducted due to national recommendations for restrictions regarding the COVID-19 pandemic (e.g. social distancing).  This format is felt to be most appropriate for this patient at this time.  All issues noted in this document were discussed and addressed.  No physical exam was performed (except for noted visual exam findings with Video Visits).   I connected with Cordelia Poche by telephone and verified that I am speaking with the correct person using two identifiers. Location patient: home Location provider: work  Persons participating in the visit: patient, provider  The limitations, risks, security and privacy concerns of performing an evaluation and management service by telephone and the availability of in person appointments have been discussed.  It has also been discussed with the patient that there may be a patient responsible charge related to this service. The patient expressed understanding and agreed to proceed.   Reason for visit: work in appt.   HPI: Covid 12/2020.  Previous sinus infection.  Abx.  Headaches.  Headache better.  Some fatigue.  No cough or congestion.  No chest pain. Noticed this am - dysuria.  Some lower abdominal/pelvic pressure.  Increased frequency.  No hematuria.  No vaginal symptoms.  No vomiting  feels similar to previous urinary tract infections.    ROS: See pertinent positives and negatives per HPI.  Past Medical History:  Diagnosis Date   Allergy    Depression    History of chicken pox    Hx: UTI (urinary tract infection)    Hypertension     Past Surgical History:  Procedure Laterality Date   COLONOSCOPY WITH PROPOFOL N/A 06/20/2016   Procedure: COLONOSCOPY WITH PROPOFOL;  Surgeon: Lucilla Lame, MD;  Location: [facility] ENDOSCOPY;  Service: Endoscopy;  Laterality: N/A;   ESOPHAGOGASTRODUODENOSCOPY (EGD) WITH PROPOFOL  N/A 06/20/2016   Procedure: ESOPHAGOGASTRODUODENOSCOPY (EGD) WITH PROPOFOL;  Surgeon: Lucilla Lame, MD;  Location: [facility] ENDOSCOPY;  Service: Endoscopy;  Laterality: N/A;   TONSILLECTOMY     as a child   TUMOR REMOVAL      Family History  Problem Relation Age of Onset   Hypertension Mother    Cancer Sister        colon cancer   Hypertension Sister    Diabetes Sister    Hypertension Brother    Hypertension Maternal Aunt    Breast cancer Maternal Aunt 60   Hypertension Maternal Uncle    Stroke Maternal Grandmother    Hypertension Maternal Grandmother     SOCIAL HX: reviewed.    Current Outpatient Medications:    cephALEXin (KEFLEX) 250 MG/5ML suspension, Take 10 mLs (500 mg total) by mouth 2 (two) times daily., Disp: 100 mL, Rfl: 0   conjugated estrogens (PREMARIN) vaginal cream, Place 1 Applicatorful vaginally daily. Use pea sized amount M-W-Fr before bedtime, Disp: 42.5 g, Rfl: 12   Cyanocobalamin (VITAMIN B-12 IJ), Inject as directed. Weekly for 3 weeks and then monthly, Disp: , Rfl:    esomeprazole (NEXIUM) 40 MG capsule, TAKE 1 CAPSULE DAILY AT 12 NOON, Disp: 90 capsule, Rfl: 0   hydrochlorothiazide (HYDRODIURIL) 25 MG tablet, Take 1/2 tablet q day, Disp: 45 tablet, Rfl: 1   losartan (COZAAR) 25 MG tablet, TAKE 1 TABLET(25 MG) BY MOUTH DAILY, Disp: 90 tablet, Rfl: 1   rosuvastatin (CRESTOR) 10 MG tablet, TAKE 1 TABLET EVERY DAY, Disp: 90 tablet, Rfl: 0  sertraline (ZOLOFT) 25 MG tablet, TAKE 1 TABLET EVERY DAY, Disp: 90 tablet, Rfl: 1  EXAM:  GENERAL: alert. Sounds to be in no acute distress.  Answering questions appropriately.    PSYCH/NEURO: pleasant and cooperative, no obvious depression or anxiety, speech and thought processing grossly intact  ASSESSMENT AND PLAN:  Discussed the following assessment and plan:  Problem List Items Addressed This Visit     Dysuria   Relevant Orders   Urinalysis, Routine w reflex microscopic (Completed)   POCT urinalysis dipstick  (Completed)   Essential hypertension, benign    On losartan and hctz.  Continue current medication regimen.  Follow pressures.        UTI (urinary tract infection)    Symptoms and urine dip appear to be c/w UTI.  Treat with keflex suspension.  Stay hydrated.  Follow.  Call with update.  Await culture results.        Relevant Medications   cephALEXin (KEFLEX) 250 MG/5ML suspension    Return if symptoms worsen or fail to improve, for keep scheduled.   I discussed the assessment and treatment plan with the patient. The patient was provided an opportunity to ask questions and all were answered. The patient agreed with the plan and demonstrated an understanding of the instructions.   The patient was advised to call back or seek an in-person evaluation if the symptoms worsen or if the condition fails to improve as anticipated.  I provided 22 minutes of non-face-to-face time during this encounter.   Dale Fairview, MD

## 2021-03-18 NOTE — Telephone Encounter (Signed)
Pt called in requesting medication to be prescribe for UTI. Pt would like call back.

## 2021-03-18 NOTE — Telephone Encounter (Signed)
Patient did a home test and was positive for UTI Patient is having burning, urinary retention, frequency. Denies fever, chills, back pain, abd pain, nausea, etc. Symptoms started this morning. Advised that since it is the weekend and we have not collected a urine, she does need to be evaluated. Patient stated that she would like to do a virtual with PCP if able and collect urine to send for culture on Monday BUT if unable to do, she is agreeable to my chart e-visit. Advised that I would check with PCP.

## 2021-03-18 NOTE — Telephone Encounter (Signed)
See if she can collect and bring in a urine sample this pm.  Check urine dip, urine micro and urine culture.  Can do virtual this pm if can drop off urine.

## 2021-03-18 NOTE — Telephone Encounter (Signed)
Patient is coming in to give urine sample this PM and will do virtual visit this PM. Added to nurse schedule and Dr Marina Goodell schedule.

## 2021-03-19 LAB — URINALYSIS, ROUTINE W REFLEX MICROSCOPIC
Bilirubin Urine: NEGATIVE
Glucose, UA: NEGATIVE
Hgb urine dipstick: NEGATIVE
Ketones, ur: NEGATIVE
Nitrite: NEGATIVE
Protein, ur: NEGATIVE
Specific Gravity, Urine: 1.012 (ref 1.001–1.035)
pH: 7.5 (ref 5.0–8.0)

## 2021-03-20 LAB — URINE CULTURE
MICRO NUMBER:: 12626610
SPECIMEN QUALITY:: ADEQUATE

## 2021-03-24 ENCOUNTER — Telehealth: Payer: Self-pay | Admitting: Internal Medicine

## 2021-03-24 NOTE — Telephone Encounter (Signed)
Can see if she is having symptoms.  If she is not having symptoms, then do not need urine sample.  Is scheduled for fasting labs 04/05/21 - can just keep that appt.  Let me know if questions.

## 2021-03-24 NOTE — Telephone Encounter (Signed)
LM for patient. Looks like appt has already been cancelled.

## 2021-03-24 NOTE — Telephone Encounter (Signed)
Patient has a lab appt 03/25/2021, there are orders in for blood but not Urine. Does the Pt need a Urine test, because on the appt schedule it says Urine.

## 2021-03-25 ENCOUNTER — Other Ambulatory Visit: Payer: Medicare HMO

## 2021-03-27 ENCOUNTER — Encounter: Payer: Self-pay | Admitting: Internal Medicine

## 2021-03-27 DIAGNOSIS — N390 Urinary tract infection, site not specified: Secondary | ICD-10-CM | POA: Insufficient documentation

## 2021-03-27 NOTE — Assessment & Plan Note (Signed)
On losartan and hctz.  Continue current medication regimen.  Follow pressures.   

## 2021-03-27 NOTE — Assessment & Plan Note (Signed)
Symptoms and urine dip appear to be c/w UTI.  Treat with keflex suspension.  Stay hydrated.  Follow.  Call with update.  Await culture results.

## 2021-04-05 ENCOUNTER — Other Ambulatory Visit: Payer: Self-pay

## 2021-04-05 ENCOUNTER — Other Ambulatory Visit (INDEPENDENT_AMBULATORY_CARE_PROVIDER_SITE_OTHER): Payer: Medicare HMO

## 2021-04-05 DIAGNOSIS — I10 Essential (primary) hypertension: Secondary | ICD-10-CM

## 2021-04-05 DIAGNOSIS — E7800 Pure hypercholesterolemia, unspecified: Secondary | ICD-10-CM

## 2021-04-05 DIAGNOSIS — E538 Deficiency of other specified B group vitamins: Secondary | ICD-10-CM

## 2021-04-05 DIAGNOSIS — R739 Hyperglycemia, unspecified: Secondary | ICD-10-CM

## 2021-04-05 DIAGNOSIS — E559 Vitamin D deficiency, unspecified: Secondary | ICD-10-CM | POA: Diagnosis not present

## 2021-04-05 LAB — LIPID PANEL
Cholesterol: 128 mg/dL (ref 0–200)
HDL: 45.5 mg/dL (ref >39.00)
LDL Cholesterol: 65 mg/dL (ref 0–99)
NonHDL: 82.34
Total CHOL/HDL Ratio: 3
Triglycerides: 87 mg/dL (ref 0.0–149.0)
VLDL: 17.4 mg/dL (ref 0.0–40.0)

## 2021-04-05 LAB — CBC WITH DIFFERENTIAL/PLATELET
Basophils Absolute: 0 10*3/uL (ref 0.0–0.1)
Basophils Relative: 0.3 % (ref 0.0–3.0)
Eosinophils Absolute: 0.2 10*3/uL (ref 0.0–0.7)
Eosinophils Relative: 3.9 % (ref 0.0–5.0)
HCT: 32.6 % — ABNORMAL LOW (ref 36.0–46.0)
Hemoglobin: 10.9 g/dL — ABNORMAL LOW (ref 12.0–15.0)
Lymphocytes Relative: 40.7 % (ref 12.0–46.0)
Lymphs Abs: 2.4 10*3/uL (ref 0.7–4.0)
MCHC: 33.4 g/dL (ref 30.0–36.0)
MCV: 88.3 fl (ref 78.0–100.0)
Monocytes Absolute: 0.6 10*3/uL (ref 0.1–1.0)
Monocytes Relative: 9.5 % (ref 3.0–12.0)
Neutro Abs: 2.7 10*3/uL (ref 1.4–7.7)
Neutrophils Relative %: 45.6 % (ref 43.0–77.0)
Platelets: 253 10*3/uL (ref 150.0–400.0)
RBC: 3.69 Mil/uL — ABNORMAL LOW (ref 3.87–5.11)
RDW: 13.9 % (ref 11.5–15.5)
WBC: 5.8 10*3/uL (ref 4.0–10.5)

## 2021-04-05 LAB — VITAMIN D 25 HYDROXY (VIT D DEFICIENCY, FRACTURES): VITD: 17.61 ng/mL — ABNORMAL LOW (ref 30.00–100.00)

## 2021-04-05 LAB — BASIC METABOLIC PANEL
BUN: 13 mg/dL (ref 6–23)
CO2: 29 mEq/L (ref 19–32)
Calcium: 9.4 mg/dL (ref 8.4–10.5)
Chloride: 106 mEq/L (ref 96–112)
Creatinine, Ser: 0.98 mg/dL (ref 0.40–1.20)
GFR: 56.63 mL/min — ABNORMAL LOW (ref >60.00)
Glucose, Bld: 111 mg/dL — ABNORMAL HIGH (ref 70–99)
Potassium: 4.1 mEq/L (ref 3.5–5.1)
Sodium: 143 mEq/L (ref 135–145)

## 2021-04-05 LAB — HEPATIC FUNCTION PANEL
ALT: 13 U/L (ref 0–35)
AST: 15 U/L (ref 0–37)
Albumin: 4.1 g/dL (ref 3.5–5.2)
Alkaline Phosphatase: 57 U/L (ref 39–117)
Bilirubin, Direct: 0.1 mg/dL (ref 0.0–0.3)
Total Bilirubin: 0.4 mg/dL (ref 0.2–1.2)
Total Protein: 6.3 g/dL (ref 6.0–8.3)

## 2021-04-05 LAB — HEMOGLOBIN A1C: Hgb A1c MFr Bld: 6.6 % — ABNORMAL HIGH (ref 4.6–6.5)

## 2021-04-05 LAB — TSH: TSH: 3.11 u[IU]/mL (ref 0.35–5.50)

## 2021-04-05 LAB — VITAMIN B12: Vitamin B-12: 246 pg/mL (ref 211–911)

## 2021-04-06 ENCOUNTER — Other Ambulatory Visit: Payer: Self-pay | Admitting: Internal Medicine

## 2021-04-06 ENCOUNTER — Other Ambulatory Visit (INDEPENDENT_AMBULATORY_CARE_PROVIDER_SITE_OTHER): Payer: Medicare HMO

## 2021-04-06 DIAGNOSIS — D649 Anemia, unspecified: Secondary | ICD-10-CM | POA: Diagnosis not present

## 2021-04-06 LAB — IBC + FERRITIN
Ferritin: 6 ng/mL — ABNORMAL LOW (ref 10.0–291.0)
Iron: 45 ug/dL (ref 42–145)
Saturation Ratios: 11.9 % — ABNORMAL LOW (ref 20.0–50.0)
TIBC: 376.6 ug/dL (ref 250.0–450.0)
Transferrin: 269 mg/dL (ref 212.0–360.0)

## 2021-04-06 NOTE — Progress Notes (Signed)
Order placed for add on lab.  °

## 2021-04-07 ENCOUNTER — Other Ambulatory Visit: Payer: Self-pay

## 2021-04-07 ENCOUNTER — Ambulatory Visit (INDEPENDENT_AMBULATORY_CARE_PROVIDER_SITE_OTHER): Payer: Medicare HMO | Admitting: Internal Medicine

## 2021-04-07 ENCOUNTER — Encounter: Payer: Self-pay | Admitting: Internal Medicine

## 2021-04-07 VITALS — BP 144/80 | HR 82 | Temp 98.0°F | Resp 16 | Ht 67.0 in | Wt 230.0 lb

## 2021-04-07 DIAGNOSIS — D649 Anemia, unspecified: Secondary | ICD-10-CM | POA: Diagnosis not present

## 2021-04-07 DIAGNOSIS — E7800 Pure hypercholesterolemia, unspecified: Secondary | ICD-10-CM | POA: Diagnosis not present

## 2021-04-07 DIAGNOSIS — F32A Depression, unspecified: Secondary | ICD-10-CM | POA: Diagnosis not present

## 2021-04-07 DIAGNOSIS — I10 Essential (primary) hypertension: Secondary | ICD-10-CM | POA: Diagnosis not present

## 2021-04-07 DIAGNOSIS — R051 Acute cough: Secondary | ICD-10-CM

## 2021-04-07 DIAGNOSIS — K219 Gastro-esophageal reflux disease without esophagitis: Secondary | ICD-10-CM | POA: Diagnosis not present

## 2021-04-07 DIAGNOSIS — R739 Hyperglycemia, unspecified: Secondary | ICD-10-CM | POA: Diagnosis not present

## 2021-04-07 DIAGNOSIS — E559 Vitamin D deficiency, unspecified: Secondary | ICD-10-CM

## 2021-04-07 DIAGNOSIS — I700 Atherosclerosis of aorta: Secondary | ICD-10-CM

## 2021-04-07 DIAGNOSIS — E538 Deficiency of other specified B group vitamins: Secondary | ICD-10-CM | POA: Diagnosis not present

## 2021-04-07 NOTE — Progress Notes (Addendum)
[name], female   DOB: [birth year], 75 y.o.   MRN: [id number]   Subjective:    Patient ID: Lonzo Candy, female    DOB: 04-24-1946, 75 y.o.   MRN: [id number]  This visit occurred during the SARS-CoV-2 public health emergency.  Safety protocols were in place, including screening questions prior to the visit, additional usage of staff PPE, and extensive cleaning of exam room while observing appropriate contact time as indicated for disinfecting solutions.   Patient here for a scheduled follow up.   Chief Complaint  Patient presents with   Cough   Follow-up   .   HPI Noticed after Thanksgiving - increased cough.  No sore throat.  No headache.  No fever.  Some acid reflux.  No chest pain or sob.  No abdominal pain or bowel change reported.  Discussed recent labs.  Low iron.  Low b12 and low vitamin D.  Unable to swallow pills.     Past Medical History:  Diagnosis Date   Allergy    Depression    History of chicken pox    Hx: UTI (urinary tract infection)    Hypertension    Past Surgical History:  Procedure Laterality Date   COLONOSCOPY WITH PROPOFOL N/A 06/20/2016   Procedure: COLONOSCOPY WITH PROPOFOL;  Surgeon: Lucilla Lame, MD;  Location: [facility];  Service: Endoscopy;  Laterality: N/A;   ESOPHAGOGASTRODUODENOSCOPY (EGD) WITH PROPOFOL N/A 06/20/2016   Procedure: ESOPHAGOGASTRODUODENOSCOPY (EGD) WITH PROPOFOL;  Surgeon: Lucilla Lame, MD;  Location: [facility];  Service: Endoscopy;  Laterality: N/A;   TONSILLECTOMY     as a child   TUMOR REMOVAL     Family History  Problem Relation Age of Onset   Hypertension Mother    Cancer Sister        colon cancer   Hypertension Sister    Diabetes Sister    Hypertension Brother    Hypertension Maternal Aunt    Breast cancer Maternal Aunt 21   Hypertension Maternal Uncle    Stroke Maternal Grandmother    Hypertension Maternal Grandmother    Social History   Socioeconomic History   Marital status: Divorced     Spouse name: Not on file   Number of children: Not on file   Years of education: Not on file   Highest education level: Not on file  Occupational History   Not on file  Tobacco Use   Smoking status: Never   Smokeless tobacco: Never  Vaping Use   Vaping Use: Never used  Substance and Sexual Activity   Alcohol use: No    Alcohol/week: 0.0 standard drinks   Drug use: No   Sexual activity: Not Currently  Other Topics Concern   Not on file  Social History Narrative   Not on file   Social Determinants of Health   Financial Resource Strain: Low Risk    Difficulty of Paying Living Expenses: Not hard at all  Food Insecurity: No Food Insecurity   Worried About Charity fundraiser in the Last Year: Never true   [place] in the Last Year: Never true  Transportation Needs: No Transportation Needs   Lack of Transportation (Medical): No   Lack of Transportation (Non-Medical): No  Physical Activity: Unknown   Days of Exercise per Week: 0 days   Minutes of Exercise per Session: Not on file  Stress: No Stress Concern Present   Feeling of Stress : Not at all  Social Connections: Unknown  Frequency of Communication with Friends and Family: More than three times a week   Frequency of Social Gatherings with Friends and Family: More than three times a week   Attends Religious Services: Not on Electrical engineer or Organizations: Not on file   Attends Archivist Meetings: Not on file   Marital Status: Not on file     Review of Systems  Constitutional:  Negative for appetite change and unexpected weight change.  HENT:  Positive for congestion. Negative for sore throat.   Respiratory:  Positive for cough. Negative for chest tightness and shortness of breath.   Cardiovascular:  Negative for chest pain, palpitations and leg swelling.  Gastrointestinal:  Negative for abdominal pain, diarrhea, nausea and vomiting.  Genitourinary:  Negative for difficulty  urinating and dysuria.  Musculoskeletal:  Negative for myalgias and neck pain.  Skin:  Negative for color change and rash.  Neurological:  Negative for dizziness, light-headedness and headaches.  Psychiatric/Behavioral:  Negative for agitation and dysphoric mood.       Objective:     BP (!) 144/80   Pulse 82   Temp 98 F (36.7 C) (Oral)   Resp 16   Ht _0  (1.702 m)   Wt 230 lb (104.3 kg)   SpO2 99%   BMI 36.02 kg/m  Wt Readings from Last 3 Encounters:  04/07/21 230 lb (104.3 kg)  12/02/20 224 lb (101.6 kg)  08/18/20 226 lb (102.5 kg)    Physical Exam Vitals reviewed.  Constitutional:      General: She is not in acute distress.    Appearance: Normal appearance.  HENT:     Head: Normocephalic and atraumatic.     Right Ear: External ear normal.     Left Ear: External ear normal.  Eyes:     General: No scleral icterus.       Right eye: No discharge.        Left eye: No discharge.     Conjunctiva/sclera: Conjunctivae normal.  Neck:     Thyroid: No thyromegaly.  Cardiovascular:     Rate and Rhythm: Normal rate and regular rhythm.  Pulmonary:     Effort: No respiratory distress.     Breath sounds: Normal breath sounds. No wheezing.  Abdominal:     General: Bowel sounds are normal.     Palpations: Abdomen is soft.     Tenderness: There is no abdominal tenderness.  Musculoskeletal:        General: No swelling or tenderness.     Cervical back: Neck supple. No tenderness.  Lymphadenopathy:     Cervical: No cervical adenopathy.  Skin:    Findings: No erythema or rash.  Neurological:     Mental Status: She is alert.  Psychiatric:        Mood and Affect: Mood normal.        Behavior: Behavior normal.     Outpatient Encounter Medications as of 04/07/2021  Medication Sig   cephALEXin (KEFLEX) 250 MG/5ML suspension Take 10 mLs (500 mg total) by mouth 2 (two) times daily.   conjugated estrogens (PREMARIN) vaginal cream Place 1 Applicatorful vaginally daily. Use pea  sized amount M-W-Fr before bedtime   Cyanocobalamin (VITAMIN B-12 IJ) Inject as directed. Weekly for 3 weeks and then monthly   esomeprazole (NEXIUM) 40 MG capsule TAKE 1 CAPSULE DAILY AT 12 NOON   hydrochlorothiazide (HYDRODIURIL) 25 MG tablet Take 1/2 tablet q day   losartan (COZAAR) 25 MG  tablet TAKE 1 TABLET(25 MG) BY MOUTH DAILY   rosuvastatin (CRESTOR) 10 MG tablet TAKE 1 TABLET EVERY DAY   sertraline (ZOLOFT) 25 MG tablet TAKE 1 TABLET EVERY DAY   No facility-administered encounter medications on file as of 04/07/2021.     Lab Results  Component Value Date   WBC 5.8 04/05/2021   HGB 10.9 (L) 04/05/2021   HCT 32.6 (L) 04/05/2021   PLT 253.0 04/05/2021   GLUCOSE 111 (H) 04/05/2021   CHOL 128 04/05/2021   TRIG 87.0 04/05/2021   HDL 45.50 04/05/2021   LDLCALC 65 04/05/2021   ALT 13 04/05/2021   AST 15 04/05/2021   NA 143 04/05/2021   K 4.1 04/05/2021   CL 106 04/05/2021   CREATININE 0.98 04/05/2021   BUN 13 04/05/2021   CO2 29 04/05/2021   TSH 3.11 04/05/2021   HGBA1C 6.6 (H) 04/05/2021    MM 3D SCREEN BREAST BILATERAL  Result Date: 12/17/2020 CLINICAL DATA:  Screening. EXAM: DIGITAL SCREENING BILATERAL MAMMOGRAM WITH TOMOSYNTHESIS AND CAD TECHNIQUE: Bilateral screening digital craniocaudal and mediolateral oblique mammograms were obtained. Bilateral screening digital breast tomosynthesis was performed. The images were evaluated with computer-aided detection. COMPARISON:  Previous exam(s). ACR Breast Density Category b: There are scattered areas of fibroglandular density. FINDINGS: There are no findings suspicious for malignancy. IMPRESSION: No mammographic evidence of malignancy. A result letter of this screening mammogram will be mailed directly to the patient. RECOMMENDATION: Screening mammogram in one year. (Code:SM-B-01Y) BI-RADS CATEGORY  1: Negative. Electronically Signed   By: Margarette Canada M.D.   On: 12/17/2020 12:31      Assessment & Plan:   Problem List Items  Addressed This Visit     Anemia    hgb decreased on recent check.  Iron low.  Need to start iron supplements.  Unable to swallow tablets.  Given IDA, refer to GI for further evaluation.        Relevant Orders   Ambulatory referral to Gastroenterology   Aortic atherosclerosis ([place])    Continue crestor.       B12 deficiency    B12 injection today.  Continue B12 injection 1036mg q week x 3 more weeks and then 10022m q month.        Cough - Primary    Increased cough as outlined.  Treat with robitussin DM and steroid nasal spray.  Swab for flu, RSV and covid.  Discussed quarantine guidelines.  Follow.  Call with update.       Relevant Orders   COVID-19, Flu A+B and RSV (Completed)   Essential hypertension, benign    On losartan and hctz.  Continue current medication regimen.  Follow pressures.        GERD (gastroesophageal reflux disease)    No acid reflux symptoms reported.  Continue nexium.       Hypercholesteremia    Continue crestor.  Low cholesterol diet and exercise.  Follow lipid panel and liver function tests.       Hyperglycemia    Low carb diet and exercise.  Follow met b and a1c.       Mild depression    Discussed.  Overall she feels she is handling things relatively well.  Does not feel she needs any further invention at this time.  Continue Zoloft.      Vitamin D deficiency    Will start vitamin D supplements - gummies.  Follow.         ChEinar PheasantMD

## 2021-04-07 NOTE — Patient Instructions (Addendum)
Robitussin DM - twice a day as needed for cough and congestion   Nasacort nasal spray - 2 sprays each nostril one time per day.

## 2021-04-08 LAB — COVID-19, FLU A+B AND RSV
Influenza A, NAA: DETECTED — AB
Influenza B, NAA: NOT DETECTED
RSV, NAA: NOT DETECTED
SARS-CoV-2, NAA: NOT DETECTED

## 2021-04-10 ENCOUNTER — Encounter: Payer: Self-pay | Admitting: Internal Medicine

## 2021-04-10 DIAGNOSIS — I700 Atherosclerosis of aorta: Secondary | ICD-10-CM | POA: Insufficient documentation

## 2021-04-10 DIAGNOSIS — D509 Iron deficiency anemia, unspecified: Secondary | ICD-10-CM | POA: Insufficient documentation

## 2021-04-10 DIAGNOSIS — D649 Anemia, unspecified: Secondary | ICD-10-CM | POA: Insufficient documentation

## 2021-04-10 NOTE — Assessment & Plan Note (Signed)
Continue crestor.  Low cholesterol diet and exercise. Follow lipid panel and liver function tests.   

## 2021-04-10 NOTE — Assessment & Plan Note (Signed)
Will start vitamin D supplements - gummies.  Follow.

## 2021-04-10 NOTE — Assessment & Plan Note (Signed)
On losartan and hctz.  Continue current medication regimen.  Follow pressures.   

## 2021-04-10 NOTE — Assessment & Plan Note (Signed)
No acid reflux symptoms reported.  Continue nexium.  

## 2021-04-10 NOTE — Assessment & Plan Note (Signed)
Discussed.  Overall she feels she is handling things relatively well.  Does not feel she needs any further invention at this time.  Continue Zoloft.

## 2021-04-10 NOTE — Assessment & Plan Note (Signed)
Low carb diet and exercise.  Follow met b and a1c.

## 2021-04-10 NOTE — Assessment & Plan Note (Signed)
B12 injection today.  Continue B12 injection q week x 3 more weeks and then q month.

## 2021-04-10 NOTE — Assessment & Plan Note (Signed)
Continue crestor 

## 2021-04-10 NOTE — Assessment & Plan Note (Signed)
hgb decreased on recent check.  Iron low.  Need to start iron supplements.  Unable to swallow tablets.  Given IDA, refer to GI for further evaluation.

## 2021-04-10 NOTE — Assessment & Plan Note (Signed)
Increased cough as outlined.  Treat with robitussin DM and steroid nasal spray.  Swab for flu, RSV and covid.  Discussed quarantine guidelines.  Follow.  Call with update.

## 2021-04-13 ENCOUNTER — Encounter: Payer: Self-pay | Admitting: Internal Medicine

## 2021-04-13 MED ORDER — FERROUS SULFATE 220 (44 FE) MG/5ML PO ELIX: 220.0000 mg | ORAL_SOLUTION | Freq: Every day | ORAL | 3 refills | Status: DC

## 2021-04-13 NOTE — Telephone Encounter (Signed)
It appears that she did not check out. Per note, needs a f/u appt in 3 months and needs to be scheduled for B12 injection q week x 3 more weeks and then q month.  Liquid iron sent in to [facility].

## 2021-04-14 DIAGNOSIS — H524 Presbyopia: Secondary | ICD-10-CM | POA: Diagnosis not present

## 2021-04-14 NOTE — Telephone Encounter (Signed)
Patient scheduled.

## 2021-04-17 ENCOUNTER — Other Ambulatory Visit: Payer: Self-pay | Admitting: Internal Medicine

## 2021-04-18 ENCOUNTER — Other Ambulatory Visit: Payer: Self-pay

## 2021-04-18 ENCOUNTER — Ambulatory Visit (INDEPENDENT_AMBULATORY_CARE_PROVIDER_SITE_OTHER): Payer: Medicare HMO

## 2021-04-18 DIAGNOSIS — E538 Deficiency of other specified B group vitamins: Secondary | ICD-10-CM

## 2021-04-18 MED ORDER — CYANOCOBALAMIN 1000 MCG/ML IJ SOLN
1000.0000 ug | Freq: Once | INTRAMUSCULAR | Status: AC
  Administered 2021-04-18: 1000 ug via INTRAMUSCULAR

## 2021-04-18 NOTE — Progress Notes (Signed)
Patient presented for B 12 injection to left deltoid, patient voiced no concerns nor showed any signs of distress during injection. 

## 2021-04-25 ENCOUNTER — Other Ambulatory Visit: Payer: Self-pay

## 2021-04-25 ENCOUNTER — Ambulatory Visit (INDEPENDENT_AMBULATORY_CARE_PROVIDER_SITE_OTHER): Payer: Medicare HMO

## 2021-04-25 DIAGNOSIS — E538 Deficiency of other specified B group vitamins: Secondary | ICD-10-CM | POA: Diagnosis not present

## 2021-04-25 MED ORDER — CYANOCOBALAMIN 1000 MCG/ML IJ SOLN
1000.0000 ug | Freq: Once | INTRAMUSCULAR | Status: AC
  Administered 2021-04-25: 15:00:00 1000 ug via INTRAMUSCULAR

## 2021-04-25 NOTE — Progress Notes (Signed)
Patient presented for B 12 injection to left deltoid, patient voiced no concerns nor showed any signs of distress during injection. 

## 2021-05-03 ENCOUNTER — Ambulatory Visit (INDEPENDENT_AMBULATORY_CARE_PROVIDER_SITE_OTHER): Payer: Medicare HMO | Admitting: Adult Health

## 2021-05-03 ENCOUNTER — Telehealth: Payer: Self-pay | Admitting: Internal Medicine

## 2021-05-03 ENCOUNTER — Encounter: Payer: Self-pay | Admitting: Adult Health

## 2021-05-03 ENCOUNTER — Other Ambulatory Visit: Payer: Self-pay

## 2021-05-03 ENCOUNTER — Ambulatory Visit: Payer: Medicare HMO

## 2021-05-03 VITALS — BP 138/82 | HR 100 | Resp 16 | Ht 67.01 in | Wt 229.4 lb

## 2021-05-03 DIAGNOSIS — R300 Dysuria: Secondary | ICD-10-CM | POA: Diagnosis not present

## 2021-05-03 DIAGNOSIS — N3000 Acute cystitis without hematuria: Secondary | ICD-10-CM | POA: Diagnosis not present

## 2021-05-03 DIAGNOSIS — E538 Deficiency of other specified B group vitamins: Secondary | ICD-10-CM

## 2021-05-03 LAB — POCT URINALYSIS DIPSTICK
Bilirubin, UA: NEGATIVE
Glucose, UA: NEGATIVE
Ketones, UA: NEGATIVE
Nitrite, UA: NEGATIVE
Protein, UA: NEGATIVE
Spec Grav, UA: 1.015 (ref 1.010–1.025)
Urobilinogen, UA: 0.2 E.U./dL
pH, UA: 5.5 (ref 5.0–8.0)

## 2021-05-03 MED ORDER — CEPHALEXIN 250 MG/5ML PO SUSR: 500.0000 mg | Freq: Two times a day (BID) | ORAL | 0 refills | Status: AC

## 2021-05-03 MED ORDER — CYANOCOBALAMIN 1000 MCG/ML IJ SOLN
1000.0000 ug | Freq: Once | INTRAMUSCULAR | Status: AC
  Administered 2021-05-03: 16:00:00 1000 ug via INTRAMUSCULAR

## 2021-05-03 NOTE — Progress Notes (Signed)
Please send for urine microscopic and urine culture.

## 2021-05-03 NOTE — Telephone Encounter (Signed)
Pt calling in regards to uti. Pt is coming in at 2:15pm today for a NV. Pt is wanting to know if she can drop off a sample to be tested

## 2021-05-03 NOTE — Telephone Encounter (Signed)
Pt having burning, frequency, pressure, etc. She is coming in today for NV and will leave urine while she is here. Sched with Dr Birdie Sons tomorrow. Dip has been ordered to have results for appt.

## 2021-05-03 NOTE — Progress Notes (Signed)
Acute Office Visit  Subjective:    [name], female    DOB: 07/16/1945, 75 y.o.   MRN: [id number]  Chief Complaint  Patient presents with   Urinary Frequency   Urinary Urgency   Dysuria    Urinary Tract Infection  This is a new problem. The current episode started yesterday. The problem has been gradually worsening. The quality of the pain is described as burning. The pain is mild. The fever has been present for Less than 1 day. There is No history of pyelonephritis. Associated symptoms include urgency. Pertinent negatives include no chills, discharge, flank pain, frequency, hematuria, hesitancy, nausea, possible pregnancy, sweats or vomiting. The treatment provided mild relief. There is no history of recurrent UTIs.  Patient is in today for urinary symptoms as above, denies any abdominal or flank pain.  Patient  denies any fever, body aches,chills, rash, chest pain, shortness of breath, nausea, vomiting, or diarrhea.  Denies dizziness, lightheadedness, pre syncopal or syncopal episodes.    Past Medical History:  Diagnosis Date   Allergy    Depression    History of chicken pox    Hx: UTI (urinary tract infection)    Hypertension     Past Surgical History:  Procedure Laterality Date   COLONOSCOPY WITH PROPOFOL N/A 06/20/2016   Procedure: COLONOSCOPY WITH PROPOFOL;  Surgeon: Lucilla Lame, MD;  Location: [facility];  Service: Endoscopy;  Laterality: N/A;   ESOPHAGOGASTRODUODENOSCOPY (EGD) WITH PROPOFOL N/A 06/20/2016   Procedure: ESOPHAGOGASTRODUODENOSCOPY (EGD) WITH PROPOFOL;  Surgeon: Lucilla Lame, MD;  Location: [facility];  Service: Endoscopy;  Laterality: N/A;   TONSILLECTOMY     as a child   TUMOR REMOVAL      Family History  Problem Relation Age of Onset   Hypertension Mother    Cancer Sister        colon cancer   Hypertension Sister    Diabetes Sister    Hypertension Brother    Hypertension Maternal Aunt    Breast cancer Maternal Aunt 45    Hypertension Maternal Uncle    Stroke Maternal Grandmother    Hypertension Maternal Grandmother     Social History   Socioeconomic History   Marital status: Divorced    Spouse name: Not on file   Number of children: Not on file   Years of education: Not on file   Highest education level: Not on file  Occupational History   Not on file  Tobacco Use   Smoking status: Never   Smokeless tobacco: Never  Vaping Use   Vaping Use: Never used  Substance and Sexual Activity   Alcohol use: No    Alcohol/week: 0.0 standard drinks   Drug use: No   Sexual activity: Not Currently  Other Topics Concern   Not on file  Social History Narrative   Not on file   Social Determinants of Health   Financial Resource Strain: Low Risk    Difficulty of Paying Living Expenses: Not hard at all  Food Insecurity: No Food Insecurity   Worried About Charity fundraiser in the Last Year: Never true   [place] in the Last Year: Never true  Transportation Needs: No Transportation Needs   Lack of Transportation (Medical): No   Lack of Transportation (Non-Medical): No  Physical Activity: Unknown   Days of Exercise per Week: 0 days   Minutes of Exercise per Session: Not on file  Stress: No Stress Concern Present   Feeling of  Stress : Not at all  Social Connections: Unknown   Frequency of Communication with Friends and Family: More than three times a week   Frequency of Social Gatherings with Friends and Family: More than three times a week   Attends Religious Services: Not on Electrical engineer or Organizations: Not on file   Attends Archivist Meetings: Not on file   Marital Status: Not on file  Intimate Partner Violence: Not At Risk   Fear of Current or Ex-Partner: No   Emotionally Abused: No   Physically Abused: No   Sexually Abused: No    Outpatient Medications Prior to Visit  Medication Sig Dispense Refill   conjugated estrogens (PREMARIN) vaginal cream Place  1 Applicatorful vaginally daily. Use pea sized amount M-W-Fr before bedtime 42.5 g 12   Cyanocobalamin (VITAMIN B-12 IJ) Inject as directed. Weekly for 3 weeks and then monthly     esomeprazole (NEXIUM) 40 MG capsule TAKE 1 CAPSULE DAILY AT 12 NOON 90 capsule 0   ferrous sulfate (FEROSUL) 220 (44 Fe) MG/5ML solution Take 5 mLs (220 mg total) by mouth daily. 150 mL 3   hydrochlorothiazide (HYDRODIURIL) 25 MG tablet TAKE 1/2 TABLET EVERY DAY 45 tablet 1   losartan (COZAAR) 25 MG tablet TAKE 1 TABLET(25 MG) BY MOUTH DAILY 90 tablet 1   rosuvastatin (CRESTOR) 10 MG tablet TAKE 1 TABLET EVERY DAY 90 tablet 0   sertraline (ZOLOFT) 25 MG tablet TAKE 1 TABLET EVERY DAY 90 tablet 1   cephALEXin (KEFLEX) 250 MG/5ML suspension Take 10 mLs (500 mg total) by mouth 2 (two) times daily. 100 mL 0   No facility-administered medications prior to visit.    Allergies  Allergen Reactions   Codeine Hives   Iodinated Contrast Media Swelling   Nitrofurantoin Monohyd Macro Nausea Only    Review of Systems  Constitutional:  Negative for chills.  HENT: Negative.    Respiratory: Negative.    Cardiovascular: Negative.   Gastrointestinal: Negative.  Negative for nausea and vomiting.  Genitourinary:  Positive for dysuria and urgency. Negative for decreased urine volume, difficulty urinating, dyspareunia, enuresis, flank pain, frequency, genital sores, hematuria, hesitancy, menstrual problem, pelvic pain, vaginal bleeding, vaginal discharge and vaginal pain.  Musculoskeletal: Negative.   Neurological: Negative.   Hematological: Negative.   Psychiatric/Behavioral: Negative.        Objective:    Physical Exam Vitals reviewed.  Constitutional:      General: She is not in acute distress.    Appearance: She is well-developed. She is not diaphoretic.     Interventions: She is not intubated. HENT:     Head: Normocephalic and atraumatic.     Right Ear: External ear normal.     Left Ear: External ear normal.      Nose: Nose normal.     Mouth/Throat:     Pharynx: No oropharyngeal exudate.  Eyes:     General: Lids are normal. No scleral icterus.       Right eye: No discharge.        Left eye: No discharge.     Conjunctiva/sclera: Conjunctivae normal.     Right eye: Right conjunctiva is not injected. No exudate or hemorrhage.    Left eye: Left conjunctiva is not injected. No exudate or hemorrhage.    Pupils: Pupils are equal, round, and reactive to light.  Neck:     Thyroid: No thyroid mass or thyromegaly.     Vascular: Normal carotid pulses. No  carotid bruit, hepatojugular reflux or JVD.     Trachea: Trachea and phonation normal. No tracheal tenderness or tracheal deviation.     Meningeal: Brudzinski's sign and Kernig's sign absent.  Cardiovascular:     Rate and Rhythm: Normal rate and regular rhythm.     Pulses: Normal pulses.          Radial pulses are 2+ on the right side and 2+ on the left side.       Dorsalis pedis pulses are 2+ on the right side and 2+ on the left side.       Posterior tibial pulses are 2+ on the right side and 2+ on the left side.     Heart sounds: Normal heart sounds, S1 normal and S2 normal. Heart sounds not distant. No murmur heard.   No friction rub. No gallop.  Pulmonary:     Effort: Pulmonary effort is normal. No tachypnea, bradypnea, accessory muscle usage or respiratory distress. She is not intubated.     Breath sounds: Normal breath sounds. No stridor. No wheezing, rhonchi or rales.  Chest:     Chest wall: No tenderness.  Abdominal:     General: Bowel sounds are normal. There is no distension or abdominal bruit.     Palpations: Abdomen is soft. There is no shifting dullness, fluid wave, hepatomegaly, splenomegaly, mass or pulsatile mass.     Tenderness: There is no abdominal tenderness. There is no guarding or rebound.     Hernia: No hernia is present.  Musculoskeletal:        General: No tenderness or deformity. Normal range of motion.     Cervical back:  Full passive range of motion without pain, normal range of motion and neck supple. No edema, erythema or rigidity. No spinous process tenderness or muscular tenderness. Normal range of motion.  Lymphadenopathy:     Head:     Right side of head: No submental, submandibular, tonsillar, preauricular, posterior auricular or occipital adenopathy.     Left side of head: No submental, submandibular, tonsillar, preauricular, posterior auricular or occipital adenopathy.     Cervical: No cervical adenopathy.     Right cervical: No superficial, deep or posterior cervical adenopathy.    Left cervical: No superficial, deep or posterior cervical adenopathy.     Upper Body:     Right upper body: No supraclavicular or pectoral adenopathy.     Left upper body: No supraclavicular or pectoral adenopathy.  Skin:    General: Skin is warm and dry.     Coloration: Skin is not pale.     Findings: No abrasion, bruising, burn, ecchymosis, erythema, lesion, petechiae or rash.     Nails: There is no clubbing.  Neurological:     Mental Status: She is alert and oriented to person, place, and time.     GCS: GCS eye subscore is 4. GCS verbal subscore is 5. GCS motor subscore is 6.     Cranial Nerves: No cranial nerve deficit.     Sensory: No sensory deficit.     Motor: No tremor, atrophy, abnormal muscle tone or seizure activity.     Coordination: Coordination normal.     Gait: Gait normal.     Deep Tendon Reflexes: Reflexes are normal and symmetric. Reflexes normal. Babinski sign absent on the right side. Babinski sign absent on the left side.     Reflex Scores:      Tricep reflexes are 2+ on the right side and 2+ on the  left side.      Bicep reflexes are 2+ on the right side and 2+ on the left side.      Brachioradialis reflexes are 2+ on the right side and 2+ on the left side.      Patellar reflexes are 2+ on the right side and 2+ on the left side.      Achilles reflexes are 2+ on the right side and 2+ on the left  side. Psychiatric:        Speech: Speech normal.        Behavior: Behavior normal.        Thought Content: Thought content normal.        Judgment: Judgment normal.    BP 138/82    Pulse 100    Resp 16    Ht 5' 7.01" (1.702 m)    Wt 229 lb 6.4 oz (104.1 kg)    SpO2 96%    BMI 35.92 kg/m  Wt Readings from Last 3 Encounters:  05/03/21 229 lb 6.4 oz (104.1 kg)  04/07/21 230 lb (104.3 kg)  12/02/20 224 lb (101.6 kg)    Health Maintenance Due  Topic Date Due   TETANUS/TDAP  Never done   COVID-19 Vaccine (3 - Pfizer risk series) 08/11/2019    There are no preventive care reminders to display for this patient.   Lab Results  Component Value Date   TSH 3.11 04/05/2021   Lab Results  Component Value Date   WBC 5.8 04/05/2021   HGB 10.9 (L) 04/05/2021   HCT 32.6 (L) 04/05/2021   MCV 88.3 04/05/2021   PLT 253.0 04/05/2021   Lab Results  Component Value Date   NA 143 04/05/2021   K 4.1 04/05/2021   CO2 29 04/05/2021   GLUCOSE 111 (H) 04/05/2021   BUN 13 04/05/2021   CREATININE 0.98 04/05/2021   BILITOT 0.4 04/05/2021   ALKPHOS 57 04/05/2021   AST 15 04/05/2021   ALT 13 04/05/2021   PROT 6.3 04/05/2021   ALBUMIN 4.1 04/05/2021   CALCIUM 9.4 04/05/2021   ANIONGAP 9 06/12/2019   GFR 56.63 (L) 04/05/2021   Lab Results  Component Value Date   CHOL 128 04/05/2021   Lab Results  Component Value Date   HDL 45.50 04/05/2021   Lab Results  Component Value Date   LDLCALC 65 04/05/2021   Lab Results  Component Value Date   TRIG 87.0 04/05/2021   Lab Results  Component Value Date   CHOLHDL 3 04/05/2021   Lab Results  Component Value Date   HGBA1C 6.6 (H) 04/05/2021       Assessment & Plan:   Problem List Items Addressed This Visit       Genitourinary   UTI (urinary tract infection) - Primary   Relevant Medications   cephALEXin (KEFLEX) 250 MG/5ML suspension     Other   Dysuria   Relevant Medications   cephALEXin (KEFLEX) 250 MG/5ML suspension    Other Relevant Orders   Urine Microscopic Only   Urine Culture   POCT Urinalysis Dipstick (Completed)     Meds ordered this encounter  Medications   cephALEXin (KEFLEX) 250 MG/5ML suspension    Sig: Take 10 mLs (500 mg total) by mouth 2 (two) times daily for 7 days.    Dispense:  140 mL    Refill:  0   Repeat urine microscopic and urine culture in 2 weeks after completing antibiotics. Urine culture and urine microscopic today.   Red  Flags discussed. The patient was given clear instructions to go to ER or return to medical center if any red flags develop, symptoms do not improve, worsen or new problems develop. They verbalized understanding.  Return if symptoms worsen or fail to improve, for at any time for any worsening symptoms, Go to Emergency room/ urgent care if worse.    Jairo Ben, FNP

## 2021-05-03 NOTE — Progress Notes (Signed)
Patient presented for B 12 injection to left deltoid, patient voiced no concerns nor showed any signs of distress during injection. 

## 2021-05-03 NOTE — Patient Instructions (Signed)
Urinary Tract Infection, Adult °A urinary tract infection (UTI) is an infection of any part of the urinary tract. The urinary tract includes: °The kidneys. °The ureters. °The bladder. °The urethra. °These organs make, store, and get rid of pee (urine) in the body. °What are the causes? °This infection is caused by germs (bacteria) in your genital area. These germs grow and cause swelling (inflammation) of your urinary tract. °What increases the risk? °The following factors may make you more likely to develop this condition: °Using a small, thin tube (catheter) to drain pee. °Not being able to control when you pee or poop (incontinence). °Being female. If you are female, these things can increase the risk: °Using these methods to prevent pregnancy: °A medicine that kills sperm (spermicide). °A device that blocks sperm (diaphragm). °Having low levels of a female hormone (estrogen). °Being pregnant. °You are more likely to develop this condition if: °You have genes that add to your risk. °You are sexually active. °You take antibiotic medicines. °You have trouble peeing because of: °A prostate that is bigger than normal, if you are female. °A blockage in the part of your body that drains pee from the bladder. °A kidney stone. °A nerve condition that affects your bladder. °Not getting enough to drink. °Not peeing often enough. °You have other conditions, such as: °Diabetes. °A weak disease-fighting system (immune system). °Sickle cell disease. °Gout. °Injury of the spine. °What are the signs or symptoms? °Symptoms of this condition include: °Needing to pee right away. °Peeing small amounts often. °Pain or burning when peeing. °Blood in the pee. °Pee that smells bad or not like normal. °Trouble peeing. °Pee that is cloudy. °Fluid coming from the vagina, if you are female. °Pain in the belly or lower back. °Other symptoms include: °Vomiting. °Not feeling hungry. °Feeling mixed up (confused). This may be the first symptom in  older adults. °Being tired and grouchy (irritable). °A fever. °Watery poop (diarrhea). °How is this treated? °Taking antibiotic medicine. °Taking other medicines. °Drinking enough water. °In some cases, you may need to see a specialist. °Follow these instructions at home: °Medicines °Take over-the-counter and prescription medicines only as told by your doctor. °If you were prescribed an antibiotic medicine, take it as told by your doctor. Do not stop taking it even if you start to feel better. °General instructions °Make sure you: °Pee until your bladder is empty. °Do not hold pee for a long time. °Empty your bladder after sex. °Wipe from front to back after peeing or pooping if you are a female. Use each tissue one time when you wipe. °Drink enough fluid to keep your pee pale yellow. °Keep all follow-up visits. °Contact a doctor if: °You do not get better after 1-2 days. °Your symptoms go away and then come back. °Get help right away if: °You have very bad back pain. °You have very bad pain in your lower belly. °You have a fever. °You have chills. °You feeling like you will vomit or you vomit. °Summary °A urinary tract infection (UTI) is an infection of any part of the urinary tract. °This condition is caused by germs in your genital area. °There are many risk factors for a UTI. °Treatment includes antibiotic medicines. °Drink enough fluid to keep your pee pale yellow. °This information is not intended to replace advice given to you by your health care provider. Make sure you discuss any questions you have with your health care provider. °Document Revised: 12/05/2019 Document Reviewed: 12/05/2019 °Elsevier Patient Education © 2022   Elsevier Inc. Cephalexin Capsules or Tablets What is this medication? CEPHALEXIN (sef a LEX in) treats infections caused by bacteria. It belongs to a group of medications called cephalosporin antibiotics. It will not treat colds, the flu, or infections caused by viruses. This medicine  may be used for other purposes; ask your health care provider or pharmacist if you have questions. COMMON BRAND NAME(S): Biocef, Daxbia, Keflex, Keftab What should I tell my care team before I take this medication? They need to know if you have any of these conditions: Bleeding disorder Kidney disease Liver disease Seizures Stomach or intestine problems like colitis An unusual or allergic reaction to cephalexin, other penicillin or cephalosporin antibiotics, other medications, foods, dyes, or preservatives Pregnant or trying to get pregnant Breast-feeding How should I use this medication? Take this medication by mouth. Take it as directed on the prescription label at the same time every day. You can take it with or without food. If it upsets your stomach, take it with food. Take all of this medication unless your care team tells you to stop it early. Keep taking it even if you think you are better. Talk to your care team about the use of this medication in children. While it may be prescribed for selected conditions, precautions do apply. Overdosage: If you think you have taken too much of this medicine contact a poison control center or emergency room at once. NOTE: This medicine is only for you. Do not share this medicine with others. What if I miss a dose? If you miss a dose, take it as soon as you can. If it is almost time for your next dose, take only that dose. Do not take double or extra doses. What may interact with this medication? Probenecid Some other antibiotics This list may not describe all possible interactions. Give your health care provider a list of all the medicines, herbs, non-prescription drugs, or dietary supplements you use. Also tell them if you smoke, drink alcohol, or use illegal drugs. Some items may interact with your medicine. What should I watch for while using this medication? Tell your care team if your symptoms do not start to get better or if they get  worse. Do not treat diarrhea with over the counter products. Contact your care team if you have diarrhea that lasts more than 2 days or if it is severe and watery. This medication may cause serious skin reactions. They can happen weeks to months after starting the medication. Contact your care team right away if you notice fevers or flu-like symptoms with a rash. The rash may be red or purple and then turn into blisters or peeling of the skin. Or, you might notice a red rash with swelling of the face, lips or lymph nodes in your neck or under your arms. If you have diabetes, you may get a false-positive result for sugar in your urine. Check with your care team. What side effects may I notice from receiving this medication? Side effects that you should report to your care team as soon as possible: Allergic reactions--skin rash, itching, hives, swelling of the face, lips, tongue, or throat Redness, blistering, peeling, or loosening of the skin, including inside the mouth Severe diarrhea, fever Unusual vaginal discharge, itching, or odor Side effects that usually do not require medical attention (report to your care team if they continue or are bothersome): Diarrhea Headache Nausea This list may not describe all possible side effects. Call your doctor for medical advice about side  effects. You may report side effects to FDA at 1-800-FDA-1088. Where should I keep my medication? Keep out of the reach of children and pets. Store at room temperature between 20 and 25 degrees C (68 and 77 degrees F). Throw away any unused medication after the expiration date. NOTE: This sheet is a summary. It may not cover all possible information. If you have questions about this medicine, talk to your doctor, pharmacist, or health care provider.  2022 Elsevier/Gold Standard (2020-04-19 00:00:00)

## 2021-05-03 NOTE — Addendum Note (Signed)
Addended by: Sherley Bounds on: 05/03/2021 04:29 PM   Modules accepted: Orders

## 2021-05-04 ENCOUNTER — Ambulatory Visit: Payer: Medicare HMO | Admitting: Family Medicine

## 2021-05-04 LAB — URINALYSIS, MICROSCOPIC ONLY

## 2021-05-04 NOTE — Progress Notes (Signed)
Urine culture pending. Bacteria in urine microscopic.

## 2021-05-05 LAB — URINE CULTURE
MICRO NUMBER:: 12799618
SPECIMEN QUALITY:: ADEQUATE

## 2021-05-05 NOTE — Progress Notes (Signed)
Complete Keflex. Should be sensitive for bacteria.  She should have repeat urine culture and urine microscopic two weeks after completing antibiotics.  Follow treatment plan from office  if not improving or any worsening within 72 hours and also return to office or open medical facility at ANYTIME if any symptoms persist, change, or worsen or you have any further concerns or questions. Call 911 immediately for emergencies.

## 2021-05-05 NOTE — Progress Notes (Signed)
Noted  

## 2021-05-06 ENCOUNTER — Encounter: Payer: Self-pay | Admitting: Family

## 2021-05-06 ENCOUNTER — Ambulatory Visit (INDEPENDENT_AMBULATORY_CARE_PROVIDER_SITE_OTHER): Payer: Medicare HMO | Admitting: Family

## 2021-05-06 DIAGNOSIS — R059 Cough, unspecified: Secondary | ICD-10-CM

## 2021-05-06 DIAGNOSIS — J069 Acute upper respiratory infection, unspecified: Secondary | ICD-10-CM

## 2021-05-06 MED ORDER — PREDNISONE 10 MG (21) PO TBPK: ORAL_TABLET | ORAL | 0 refills | Status: DC

## 2021-05-06 NOTE — Progress Notes (Signed)
Virtual Visit via Telephone Note  I connected with Susan Miller on 05/06/21 at  8:40 AM EST by telephone and verified that I am speaking with the correct person using two identifiers.  Location: Patient: Home  Provider: Lexine Baton   I discussed the limitations, risks, security and privacy concerns of performing an evaluation and management service by telephone and the availability of in person appointments. I also discussed with the patient that there may be a patient responsible charge related to this service. The patient expressed understanding and agreed to proceed.   History of Present Illness: 75 year old female present via phone call with concerns of cough, congestion, nausea x 3 days. She is currently on cephalexin for a UTI. She reports having COVID in August and having the Flu last month. She has taken a home COVID test that was negative. Cough persists but robitussin helps. Reports being unable to swallow pills     Observations/Objective:A&O, NAD   Assessment and Plan: Susan Miller was seen today for acute visit.  Diagnoses and all orders for this visit:  Viral upper respiratory infection  Cough, unspecified type  Other orders -     predniSONE (STERAPRED UNI-PAK 21 TAB) 10 MG (21) TBPK tablet; As directed      Follow Up Instructions:Call the office with any questions or concerns. Recheck as scheduled and sooner as needed.     I discussed the assessment and treatment plan with the patient. The patient was provided an opportunity to ask questions and all were answered. The patient agreed with the plan and demonstrated an understanding of the instructions.   The patient was advised to call back or seek an in-person evaluation if the symptoms worsen or if the condition fails to improve as anticipated.  I provided 20 minutes of non-face-to-face time during this encounter.   Eulis Foster, FNP

## 2021-05-12 ENCOUNTER — Encounter: Payer: Self-pay | Admitting: Internal Medicine

## 2021-05-12 DIAGNOSIS — R053 Chronic cough: Secondary | ICD-10-CM

## 2021-05-13 NOTE — Telephone Encounter (Signed)
Ok.  I am ok with cxr.  Let me know if she needs anything more

## 2021-05-13 NOTE — Telephone Encounter (Signed)
Patient had covid in august, flu last month. Still having persistent cough worse at night. Just completed steroid taper. Patient is requesting chest x-ray to confirm lungs clear. Are you ok with doing a chest x-ray on her? Confirmed no other acute symptoms.

## 2021-05-13 NOTE — Telephone Encounter (Signed)
Noted. Cxr ordered. Patient says she cant go today but will go have it done Monday.

## 2021-05-13 NOTE — Telephone Encounter (Signed)
Yes she completed prednisone taper

## 2021-05-13 NOTE — Telephone Encounter (Signed)
Please confirm she did take the prednisone.  Confirm symptoms have improved.  Per note, no other acute symptoms (such as sob, etc).  I am ok with cxr.  Will need f/u if symptoms persists.

## 2021-05-16 ENCOUNTER — Ambulatory Visit
Admission: RE | Admit: 2021-05-16 | Discharge: 2021-05-16 | Disposition: A | Discharge status: Home / Self Care | Payer: PPO | Attending: Internal Medicine | Admitting: Internal Medicine

## 2021-05-16 ENCOUNTER — Ambulatory Visit
Admission: RE | Admit: 2021-05-16 | Discharge: 2021-05-16 | Disposition: A | Discharge status: Home / Self Care | Payer: PPO | Source: Ambulatory Visit | Attending: Internal Medicine | Admitting: Internal Medicine

## 2021-05-16 DIAGNOSIS — R059 Cough, unspecified: Secondary | ICD-10-CM | POA: Diagnosis not present

## 2021-05-16 DIAGNOSIS — R053 Chronic cough: Secondary | ICD-10-CM | POA: Insufficient documentation

## 2021-05-17 ENCOUNTER — Ambulatory Visit (INDEPENDENT_AMBULATORY_CARE_PROVIDER_SITE_OTHER): Payer: PPO

## 2021-05-17 VITALS — Ht 67.0 in | Wt 229.0 lb

## 2021-05-17 DIAGNOSIS — Z0000 Encounter for general adult medical examination without abnormal findings: Secondary | ICD-10-CM

## 2021-05-17 NOTE — Progress Notes (Signed)
Subjective:   Susan Miller is a 76 y.o. female who presents for Medicare Annual (Subsequent) preventive examination.  Review of Systems    No ROS.  Medicare Wellness Virtual Visit.  Visual/audio telehealth visit, UTA vital signs.   See social history for additional risk factors.   Cardiac Risk Factors include: advanced age (>61men, >58 women)     Objective:    Today's Vitals   05/17/21 1320  Weight: 229 lb (103.9 kg)  Height: 5\' 7"  (1.702 m)   Body mass index is 35.87 kg/m.  Advanced Directives 05/17/2021 05/14/2020 06/12/2019 05/14/2019 01/08/2019 11/09/2018 12/06/2017  Does Patient Have a Medical Advance Directive? No No No No No No No  Does patient want to make changes to medical advance directive? - - - - No - Patient declined - No - Patient declined  Would patient like information on creating a medical advance directive? No - Patient declined No - Patient declined No - Patient declined No - Patient declined - - -    Current Medications (verified) Outpatient Encounter Medications as of 05/17/2021  Medication Sig   conjugated estrogens (PREMARIN) vaginal cream Place 1 Applicatorful vaginally daily. Use pea sized amount M-W-Fr before bedtime   Cyanocobalamin (VITAMIN B-12 IJ) Inject as directed. Weekly for 3 weeks and then monthly   esomeprazole (NEXIUM) 40 MG capsule TAKE 1 CAPSULE DAILY AT 12 NOON   ferrous sulfate (FEROSUL) 220 (44 Fe) MG/5ML solution Take 5 mLs (220 mg total) by mouth daily.   hydrochlorothiazide (HYDRODIURIL) 25 MG tablet TAKE 1/2 TABLET EVERY DAY   losartan (COZAAR) 25 MG tablet TAKE 1 TABLET(25 MG) BY MOUTH DAILY   predniSONE (STERAPRED UNI-PAK 21 TAB) 10 MG (21) TBPK tablet As directed   rosuvastatin (CRESTOR) 10 MG tablet TAKE 1 TABLET EVERY DAY   sertraline (ZOLOFT) 25 MG tablet TAKE 1 TABLET EVERY DAY   No facility-administered encounter medications on file as of 05/17/2021.    Allergies (verified) Codeine, Iodinated contrast media, and Nitrofurantoin  monohyd macro   History: Past Medical History:  Diagnosis Date   Allergy    Depression    History of chicken pox    Hx: UTI (urinary tract infection)    Hypertension    Past Surgical History:  Procedure Laterality Date   COLONOSCOPY WITH PROPOFOL N/A 06/20/2016   Procedure: COLONOSCOPY WITH PROPOFOL;  Surgeon: 06/22/2016, MD;  Location: [facility] ENDOSCOPY;  Service: Endoscopy;  Laterality: N/A;   ESOPHAGOGASTRODUODENOSCOPY (EGD) WITH PROPOFOL N/A 06/20/2016   Procedure: ESOPHAGOGASTRODUODENOSCOPY (EGD) WITH PROPOFOL;  Surgeon: 06/22/2016, MD;  Location: [facility] ENDOSCOPY;  Service: Endoscopy;  Laterality: N/A;   TONSILLECTOMY     as a child   TUMOR REMOVAL     Family History  Problem Relation Age of Onset   Hypertension Mother    Cancer Sister        colon cancer   Hypertension Sister    Diabetes Sister    Hypertension Brother    Hypertension Maternal Aunt    Breast cancer Maternal Aunt 60   Hypertension Maternal Uncle    Stroke Maternal Grandmother    Hypertension Maternal Grandmother    Social History   Socioeconomic History   Marital status: Divorced    Spouse name: Not on file   Number of children: Not on file   Years of education: Not on file   Highest education level: Not on file  Occupational History   Not on file  Tobacco Use   Smoking status: Never  Smokeless tobacco: Never  Vaping Use   Vaping Use: Never used  Substance and Sexual Activity   Alcohol use: No    Alcohol/week: 0.0 standard drinks   Drug use: No   Sexual activity: Not Currently  Other Topics Concern   Not on file  Social History Narrative   Not on file   Social Determinants of Health   Financial Resource Strain: Low Risk    Difficulty of Paying Living Expenses: Not hard at all  Food Insecurity: No Food Insecurity   Worried About Programme researcher, broadcasting/film/videounning Out of Food in the Last Year: Never true   Ran Out of Food in the Last Year: Never true  Transportation Needs: No Transportation Needs   Lack of  Transportation (Medical): No   Lack of Transportation (Non-Medical): No  Physical Activity: Not on file  Stress: No Stress Concern Present   Feeling of Stress : Not at all  Social Connections: Unknown   Frequency of Communication with Friends and Family: More than three times a week   Frequency of Social Gatherings with Friends and Family: More than three times a week   Attends Religious Services: Not on Scientist, clinical (histocompatibility and immunogenetics)file   Active Member of Clubs or Organizations: Not on file   Attends BankerClub or Organization Meetings: Not on file   Marital Status: Not on file    Tobacco Counseling Counseling given: Not Answered   Clinical Intake:  Pre-visit preparation completed: Yes        Diabetes: No  How often do you need to have someone help you when you read instructions, pamphlets, or other written materials from your doctor or pharmacy?: 1 - Never  Interpreter Needed?: No      Activities of Daily Living In your present state of health, do you have any difficulty performing the following activities: 05/17/2021  Hearing? N  Vision? N  Walking or climbing stairs? N  Dressing or bathing? N  Doing errands, shopping? N  Preparing Food and eating ? N  Using the Toilet? N  In the past six months, have you accidently leaked urine? N  Do you have problems with loss of bowel control? N  Managing your Medications? N  Managing your Finances? N  Housekeeping or managing your Housekeeping? N  Some recent data might be hidden    Patient Care Team: Dale DurhamScott, Charlene, MD as PCP - General (Internal Medicine) Debbe OdeaAgbor-Etang, Brian, MD as PCP - Cardiology (Cardiology)  Indicate any recent Medical Services you may have received from other than [facility] providers in the past year (date may be approximate).     Assessment:   This is a routine wellness examination for Susan Miller.  Virtual Visit via Telephone Note  I connected with  Susan SensingLinda H Miller on 05/17/21 at  1:15 PM EST by telephone and verified that I am  speaking with the correct person using two identifiers.   Persons participating in the virtual visit: patient/Nurse Health Advisor   I discussed the limitations, risks, security and privacy concerns of performing an evaluation and management service by telephone and the availability of in person appointments. The patient expressed understanding and agreed to proceed.  Interactive audio and video telecommunications were attempted between this nurse and patient, however failed, due to patient having technical difficulties OR patient did not have access to video capability.  We continued and completed visit with audio only.  Some vital signs may be absent or patient reported.    Hearing/Vision screen Hearing Screening - Comments:: Patient is able to hear  conversational tones without difficulty. No issues reported. Vision Screening - Comments:: [facility]  Wears corrective lenses when reading They have regular follow up with the ophthalmologist  Dietary issues and exercise activities discussed: Current Exercise Habits: The patient does not participate in regular exercise at present Regular  diet    Goals Addressed               This Visit's Progress     Patient Stated     I would like to try the GOLO diet (pt-stated)        Stay hydrated Weight loss goal 50lb      Increase physical activity (pt-stated)        Walk for exercise       Depression Screen PHQ 2/9 Scores 05/17/2021 05/03/2021 04/07/2021 12/02/2020 06/29/2020 05/14/2020 10/09/2019  PHQ - 2 Score 1 2 0 4 2 0 0  PHQ- 9 Score - - - 13 10 - 0    Fall Risk Fall Risk  05/17/2021 05/03/2021 04/07/2021 05/14/2020 08/21/2019  Falls in the past year? 0 0 0 0 0  Number falls in past yr: 0 0 0 0 0  Injury with Fall? - 0 0 0 -  Comment - - - - -  Risk for fall due to : - - No Fall Risks - -  Follow up Falls evaluation completed Falls evaluation completed - Falls evaluation completed Falls evaluation completed     FALL RISK PREVENTION PERTAINING TO THE HOME: Home free of loose throw rugs in walkways, pet beds, electrical cords, etc? Yes  Adequate lighting in your home to reduce risk of falls? Yes   ASSISTIVE DEVICES UTILIZED TO PREVENT FALLS: Life alert? No  Use of a cane, walker or w/c? No   TIMED UP AND GO: Was the test performed? No .   Cognitive Function: Patient is alert and oriented x3.  Enjoys reading  MMSE - Mini Mental State Exam 12/06/2017 12/07/2016  Orientation to time 5 5  Orientation to Place 5 5  Registration 3 3  Attention/ Calculation 5 5  Recall 3 3  Language- name 2 objects 2 2  Language- repeat 1 1  Language- follow 3 step command 3 3  Language- read & follow direction 1 1  Write a sentence 1 1  Copy design 1 1  Total score 30 30     6CIT Screen 05/14/2020 05/14/2019  What Year? 0 points 0 points  What month? 0 points 0 points  What time? 0 points 0 points  Count back from 20 0 points 0 points  Months in reverse 0 points 0 points  Repeat phrase 0 points 0 points  Total Score 0 0    Immunizations Immunization History  Administered Date(s) Administered   Fluad Quad(high Dose 65+) 01/31/2019, 01/29/2020   Influenza, High Dose Seasonal PF 01/11/2016, 04/27/2017, 01/18/2018   Influenza,inj,Quad PF,6+ Mos 02/24/2013, 02/27/2014, 02/22/2015   PFIZER(Purple Top)SARS-COV-2 Vaccination 06/23/2019, 07/14/2019   Pneumococcal Conjugate-13 11/06/2016   Pneumococcal Polysaccharide-23 12/06/2017   TDAP status: Due, Education has been provided regarding the importance of this vaccine. Advised may receive this vaccine at local pharmacy or Health Dept. Aware to provide a copy of the vaccination record if obtained from local pharmacy or Health Dept. Verbalized acceptance and understanding. Deferred.   Screening Tests Health Maintenance  Topic Date Due   COVID-19 Vaccine (3 - Pfizer risk series) 06/02/2021 (Originally 08/11/2019)   Zoster Vaccines- Shingrix (1 of 2)  06/18/2021 (Originally 03/06/1965)  INFLUENZA VACCINE  08/05/2021 (Originally 12/06/2020)   TETANUS/TDAP  05/17/2022 (Originally 03/06/1965)   MAMMOGRAM  12/16/2021   COLONOSCOPY (Pts 45-48yrs Insurance coverage will need to be confirmed)  06/20/2026   Pneumonia Vaccine 79+ Years old  Completed   DEXA SCAN  Completed   Hepatitis C Screening  Completed   HPV VACCINES  Aged Out   Health Maintenance There are no preventive care reminders to display for this patient.  Lung Cancer Screening: (Low Dose CT Chest recommended if Age 56-80 years, 30 pack-year currently smoking OR have quit w/in 15years.) does not qualify.   Vision Screening: Recommended annual ophthalmology exams for early detection of glaucoma and other disorders of the eye.  Dental Screening: Recommended annual dental exams for proper oral hygiene  Community Resource Referral / Chronic Care Management: CRR required this visit?  No   CCM required this visit?  No      Plan:   Keep all routine maintenance appointments.   I have personally reviewed and noted the following in the patients chart:   Medical and social history Use of alcohol, tobacco or illicit drugs  Current medications and supplements including opioid prescriptions. Not taking opioid.  Functional ability and status Nutritional status Physical activity Advanced directives List of other physicians Hospitalizations, surgeries, and ER visits in previous 12 months Vitals Screenings to include cognitive, depression, and falls Referrals and appointments  In addition, I have reviewed and discussed with patient certain preventive protocols, quality metrics, and best practice recommendations. A written personalized care plan for preventive services as well as general preventive health recommendations were provided to patient.     Ashok Pall, LPN   10/29/7626

## 2021-05-17 NOTE — Patient Instructions (Addendum)
Susan Miller , Thank you for taking time to come for your Medicare Wellness Visit. I appreciate your ongoing commitment to your health goals. Please review the following plan we discussed and let me know if I can assist you in the future.   These are the goals we discussed:  Goals       Patient Stated     I would like to try the GOLO diet (pt-stated)      Stay hydrated Weight loss goal 50lb      Increase physical activity (pt-stated)      Walk for exercise        This is a list of the screening recommended for you and due dates:  Health Maintenance  Topic Date Due   COVID-19 Vaccine (3 - Pfizer risk series) 06/02/2021*   Zoster (Shingles) Vaccine (1 of 2) 06/18/2021*   Flu Shot  08/05/2021*   Tetanus Vaccine  05/17/2022*   Mammogram  12/16/2021   Colon Cancer Screening  06/20/2026   Pneumonia Vaccine  Completed   DEXA scan (bone density measurement)  Completed   Hepatitis C Screening: USPSTF Recommendation to screen - Ages 74-79 yo.  Completed   HPV Vaccine  Aged Out  *Topic was postponed. The date shown is not the original due date.    Advanced directives: not yet completed  Conditions/risks identified: none new  Follow up in one year for your annual wellness visit    Preventive Care 65 Years and Older, Female Preventive care refers to lifestyle choices and visits with your health care provider that can promote health and wellness. What does preventive care include? A yearly physical exam. This is also called an annual well check. Dental exams once or twice a year. Routine eye exams. Ask your health care provider how often you should have your eyes checked. Personal lifestyle choices, including: Daily care of your teeth and gums. Regular physical activity. Eating a healthy diet. Avoiding tobacco and drug use. Limiting alcohol use. Practicing safe sex. Taking low-dose aspirin every day. Taking vitamin and mineral supplements as recommended by your health care  provider. What happens during an annual well check? The services and screenings done by your health care provider during your annual well check will depend on your age, overall health, lifestyle risk factors, and family history of disease. Counseling  Your health care provider may ask you questions about your: Alcohol use. Tobacco use. Drug use. Emotional well-being. Home and relationship well-being. Sexual activity. Eating habits. History of falls. Memory and ability to understand (cognition). Work and work Astronomer. Reproductive health. Screening  You may have the following tests or measurements: Height, weight, and BMI. Blood pressure. Lipid and cholesterol levels. These may be checked every 5 years, or more frequently if you are over 22 years old. Skin check. Lung cancer screening. You may have this screening every year starting at age 23 if you have a 30-pack-year history of smoking and currently smoke or have quit within the past 15 years. Fecal occult blood test (FOBT) of the stool. You may have this test every year starting at age 54. Flexible sigmoidoscopy or colonoscopy. You may have a sigmoidoscopy every 5 years or a colonoscopy every 10 years starting at age 67. Hepatitis C blood test. Hepatitis B blood test. Sexually transmitted disease (STD) testing. Diabetes screening. This is done by checking your blood sugar (glucose) after you have not eaten for a while (fasting). You may have this done every 1-3 years. Bone density scan. This  is done to screen for osteoporosis. You may have this done starting at age 76. Mammogram. This may be done every 1-2 years. Talk to your health care provider about how often you should have regular mammograms. Talk with your health care provider about your test results, treatment options, and if necessary, the need for more tests. Vaccines  Your health care provider may recommend certain vaccines, such as: Influenza vaccine. This is  recommended every year. Tetanus, diphtheria, and acellular pertussis (Tdap, Td) vaccine. You may need a Td booster every 10 years. Zoster vaccine. You may need this after age 76. Pneumococcal 13-valent conjugate (PCV13) vaccine. One dose is recommended after age 76. Pneumococcal polysaccharide (PPSV23) vaccine. One dose is recommended after age 76. Talk to your health care provider about which screenings and vaccines you need and how often you need them. This information is not intended to replace advice given to you by your health care provider. Make sure you discuss any questions you have with your health care provider. Document Released: 05/21/2015 Document Revised: 01/12/2016 Document Reviewed: 02/23/2015 Elsevier Interactive Patient Education  2017 [employer]  Fall Prevention in the Home Falls can cause injuries. They can happen to people of all ages. There are many things you can do to make your home safe and to help prevent falls. What can I do on the outside of my home? Regularly fix the edges of walkways and driveways and fix any cracks. Remove anything that might make you trip as you walk through a door, such as a raised step or threshold. Trim any bushes or trees on the path to your home. Use bright outdoor lighting. Clear any walking paths of anything that might make someone trip, such as rocks or tools. Regularly check to see if handrails are loose or broken. Make sure that both sides of any steps have handrails. Any raised decks and porches should have guardrails on the edges. Have any leaves, snow, or ice cleared regularly. Use sand or salt on walking paths during winter. Clean up any spills in your garage right away. This includes oil or grease spills. What can I do in the bathroom? Use night lights. Install grab bars by the toilet and in the tub and shower. Do not use towel bars as grab bars. Use non-skid mats or decals in the tub or shower. If you need to sit down in  the shower, use a plastic, non-slip stool. Keep the floor dry. Clean up any water that spills on the floor as soon as it happens. Remove soap buildup in the tub or shower regularly. Attach bath mats securely with double-sided non-slip rug tape. Do not have throw rugs and other things on the floor that can make you trip. What can I do in the bedroom? Use night lights. Make sure that you have a light by your bed that is easy to reach. Do not use any sheets or blankets that are too big for your bed. They should not hang down onto the floor. Have a firm chair that has side arms. You can use this for support while you get dressed. Do not have throw rugs and other things on the floor that can make you trip. What can I do in the kitchen? Clean up any spills right away. Avoid walking on wet floors. Keep items that you use a lot in easy-to-reach places. If you need to reach something above you, use a strong step stool that has a grab bar. Keep electrical cords out  of the way. Do not use floor polish or wax that makes floors slippery. If you must use wax, use non-skid floor wax. Do not have throw rugs and other things on the floor that can make you trip. What can I do with my stairs? Do not leave any items on the stairs. Make sure that there are handrails on both sides of the stairs and use them. Fix handrails that are broken or loose. Make sure that handrails are as long as the stairways. Check any carpeting to make sure that it is firmly attached to the stairs. Fix any carpet that is loose or worn. Avoid having throw rugs at the top or bottom of the stairs. If you do have throw rugs, attach them to the floor with carpet tape. Make sure that you have a light switch at the top of the stairs and the bottom of the stairs. If you do not have them, ask someone to add them for you. What else can I do to help prevent falls? Wear shoes that: Do not have high heels. Have rubber bottoms. Are comfortable  and fit you well. Are closed at the toe. Do not wear sandals. If you use a stepladder: Make sure that it is fully opened. Do not climb a closed stepladder. Make sure that both sides of the stepladder are locked into place. Ask someone to hold it for you, if possible. Clearly mark and make sure that you can see: Any grab bars or handrails. First and last steps. Where the edge of each step is. Use tools that help you move around (mobility aids) if they are needed. These include: Canes. Walkers. Scooters. Crutches. Turn on the lights when you go into a dark area. Replace any light bulbs as soon as they burn out. Set up your furniture so you have a clear path. Avoid moving your furniture around. If any of your floors are uneven, fix them. If there are any pets around you, be aware of where they are. Review your medicines with your doctor. Some medicines can make you feel dizzy. This can increase your chance of falling. Ask your doctor what other things that you can do to help prevent falls. This information is not intended to replace advice given to you by your health care provider. Make sure you discuss any questions you have with your health care provider. Document Released: 02/18/2009 Document Revised: 09/30/2015 Document Reviewed: 05/29/2014 Elsevier Interactive Patient Education  2017 [employer].

## 2021-05-23 ENCOUNTER — Telehealth: Payer: Self-pay

## 2021-05-23 DIAGNOSIS — R053 Chronic cough: Secondary | ICD-10-CM

## 2021-05-23 NOTE — Telephone Encounter (Signed)
Referral has been placed per result note message.  °

## 2021-05-25 ENCOUNTER — Telehealth: Payer: Self-pay | Admitting: Internal Medicine

## 2021-05-25 NOTE — Telephone Encounter (Signed)
Dr. Belia Heman and Dr. Jayme Cloud, please advise if okay to switch?

## 2021-05-26 ENCOUNTER — Other Ambulatory Visit: Payer: Self-pay

## 2021-05-26 ENCOUNTER — Ambulatory Visit
Admission: RE | Admit: 2021-05-26 | Discharge: 2021-05-26 | Disposition: A | Discharge status: Home / Self Care | Payer: PPO | Source: Ambulatory Visit | Attending: Student | Admitting: Student

## 2021-05-26 VITALS — BP 130/75 | HR 106 | Temp 98.2°F | Resp 18

## 2021-05-26 DIAGNOSIS — N3000 Acute cystitis without hematuria: Secondary | ICD-10-CM | POA: Insufficient documentation

## 2021-05-26 DIAGNOSIS — R300 Dysuria: Secondary | ICD-10-CM | POA: Insufficient documentation

## 2021-05-26 DIAGNOSIS — N390 Urinary tract infection, site not specified: Secondary | ICD-10-CM

## 2021-05-26 DIAGNOSIS — R944 Abnormal results of kidney function studies: Secondary | ICD-10-CM | POA: Diagnosis not present

## 2021-05-26 LAB — POCT URINALYSIS DIP (MANUAL ENTRY)
Bilirubin, UA: NEGATIVE
Blood, UA: NEGATIVE
Glucose, UA: NEGATIVE mg/dL
Nitrite, UA: NEGATIVE
Protein Ur, POC: NEGATIVE mg/dL
Spec Grav, UA: 1.02 (ref 1.010–1.025)
Urobilinogen, UA: 0.2 E.U./dL
pH, UA: 6 (ref 5.0–8.0)

## 2021-05-26 MED ORDER — CEFDINIR 250 MG/5ML PO SUSR: 300.0000 mg | Freq: Two times a day (BID) | ORAL | 0 refills | Status: AC

## 2021-05-26 NOTE — ED Triage Notes (Signed)
Pt was treated for UTI at the end of December with Keflex. Presents today with painful urination with increased urgency and frequency x 1 day.

## 2021-05-26 NOTE — Telephone Encounter (Signed)
ATC patient--no answer with no option to leave vm. Line rang for >1min.  Will call back.  

## 2021-05-26 NOTE — Discharge Instructions (Addendum)
-  Omnicef (Cefdinir) twice daily x5 days -Follow-up with PCP to confirm resolution -Seek additional medical attention if symptoms worsen instead of improve - abd pain, back pain, new fevers/chills, etc.

## 2021-05-26 NOTE — Telephone Encounter (Signed)
It appears that she does have mostly sleep issues please clarify.

## 2021-05-26 NOTE — ED Provider Notes (Signed)
Susan Miller    CSN: [id number] Arrival date & time: 05/26/21  0941      History   Chief Complaint Chief Complaint  Patient presents with   Dysuria    HPI Susan Miller is a 76 y.o. female presenting for possible UTI. History UTI, last 05/03/2021 managed with Keflex and culture showed sensitivity to this. Now with dysuria, frequency x1 day. Denies hematuria, urgency, back pain, n/v/d/abd pain, fevers/chills, abdnormal vaginal discharge. Requires liquid abx.    HPI  Past Medical History:  Diagnosis Date   Allergy    Depression    History of chicken pox    Hx: UTI (urinary tract infection)    Hypertension     Patient Active Problem List   Diagnosis Date Noted   Anemia 04/10/2021   Aortic atherosclerosis ([place]) 04/10/2021   UTI (urinary tract infection) 03/27/2021   Cutaneous skin tags 12/12/2020   B12 deficiency 12/12/2020   OSA (obstructive sleep apnea) 04/18/2020   Numbness and tingling of both feet 02/20/2020   Daytime hypersomnolence 01/29/2020   Dysuria 12/07/2019   Snoring 12/07/2019   Hypercholesteremia 10/09/2019   Back pain 11/15/2018   SOB (shortness of breath) 11/15/2018   Decreased GFR 11/14/2018   Hyperbilirubinemia 11/14/2018   Abdominal bloating 12/09/2017   Osteopenia 04/29/2017   Hyperglycemia 04/27/2017   Reflux esophagitis    Heartburn    Special screening for malignant neoplasms, colon    Hair loss 10/26/2015   Fatigue 10/21/2015   Vitamin D deficiency 03/15/2015   Weight loss counseling, encounter for 02/22/2015   Facial erythema 07/27/2014   Health care maintenance 07/05/2014   BP (high blood pressure) 03/26/2014   Headache 03/08/2014   Eyelid lesion 03/08/2014   Shingles 11/16/2013   Light headedness 06/22/2013   Cough 05/18/2013   GERD (gastroesophageal reflux disease) 05/18/2013   Swelling of extremity 03/01/2013   Left knee pain 03/01/2013   Essential hypertension, benign 12/15/2012   Frequent UTI 12/15/2012    Mild depression 12/15/2012   Environmental allergies 12/15/2012    Past Surgical History:  Procedure Laterality Date   COLONOSCOPY WITH PROPOFOL N/A 06/20/2016   Procedure: COLONOSCOPY WITH PROPOFOL;  Surgeon: Lucilla Lame, MD;  Location: [facility] ENDOSCOPY;  Service: Endoscopy;  Laterality: N/A;   ESOPHAGOGASTRODUODENOSCOPY (EGD) WITH PROPOFOL N/A 06/20/2016   Procedure: ESOPHAGOGASTRODUODENOSCOPY (EGD) WITH PROPOFOL;  Surgeon: Lucilla Lame, MD;  Location: [facility] ENDOSCOPY;  Service: Endoscopy;  Laterality: N/A;   TONSILLECTOMY     as a child   TUMOR REMOVAL      OB History   No obstetric history on file.      Home Medications    Prior to Admission medications   Medication Sig Start Date End Date Taking? Authorizing Provider  cefdinir (OMNICEF) 250 MG/5ML suspension Take 6 mLs (300 mg total) by mouth 2 (two) times daily for 5 days. 05/26/21 05/31/21 Yes Hazel Sams, PA-C  conjugated estrogens (PREMARIN) vaginal cream Place 1 Applicatorful vaginally daily. Use pea sized amount M-W-Fr before bedtime 02/12/19   Hollice Espy, MD  Cyanocobalamin (VITAMIN B-12 IJ) Inject as directed. Weekly for 3 weeks and then monthly    [provider]  esomeprazole (NEXIUM) 40 MG capsule TAKE 1 CAPSULE DAILY AT 12 NOON 03/11/21   Einar Pheasant, MD  ferrous sulfate (FEROSUL) 220 (44 Fe) MG/5ML solution Take 5 mLs (220 mg total) by mouth daily. 04/13/21   Einar Pheasant, MD  hydrochlorothiazide (HYDRODIURIL) 25 MG tablet TAKE 1/2 TABLET EVERY DAY 04/18/21  Einar Pheasant, MD  losartan (COZAAR) 25 MG tablet TAKE 1 TABLET(25 MG) BY MOUTH DAILY 11/19/20   Einar Pheasant, MD  predniSONE (STERAPRED UNI-PAK 21 TAB) 10 MG (21) TBPK tablet As directed 05/06/21   Dutch Quint B, FNP  rosuvastatin (CRESTOR) 10 MG tablet TAKE 1 TABLET EVERY DAY 03/11/21   Einar Pheasant, MD  sertraline (ZOLOFT) 25 MG tablet TAKE 1 TABLET EVERY DAY 01/12/21   Einar Pheasant, MD    Family History Family History  Problem  Relation Age of Onset   Hypertension Mother    Cancer Sister        colon cancer   Hypertension Sister    Diabetes Sister    Hypertension Brother    Hypertension Maternal Aunt    Breast cancer Maternal Aunt 60   Hypertension Maternal Uncle    Stroke Maternal Grandmother    Hypertension Maternal Grandmother     Social History Social History   Tobacco Use   Smoking status: Never   Smokeless tobacco: Never  Vaping Use   Vaping Use: Never used  Substance Use Topics   Alcohol use: No    Alcohol/week: 0.0 standard drinks   Drug use: No     Allergies   Codeine, Iodinated contrast media, and Nitrofurantoin monohyd macro   Review of Systems Review of Systems  Constitutional:  Negative for appetite change, chills, diaphoresis and fever.  Respiratory:  Negative for shortness of breath.   Cardiovascular:  Negative for chest pain.  Gastrointestinal:  Negative for abdominal pain, blood in stool, constipation, diarrhea, nausea and vomiting.  Genitourinary:  Positive for dysuria and frequency. Negative for decreased urine volume, difficulty urinating, flank pain, genital sores, hematuria and urgency.  Musculoskeletal:  Negative for back pain.  Neurological:  Negative for dizziness, weakness and light-headedness.  All other systems reviewed and are negative.   Physical Exam Triage Vital Signs ED Triage Vitals  Enc Vitals Group     BP      Pulse      Resp      Temp      Temp src      SpO2      Weight      Height      Head Circumference      Peak Flow      Pain Score      Pain Loc      Pain Edu?      Excl. in [place]?    No data found.  Updated Vital Signs BP 130/75 (BP Location: Left Arm)    Pulse (!) 106    Temp 98.2 F (36.8 C)    Resp 18    SpO2 98%   Visual Acuity Right Eye Distance:   Left Eye Distance:   Bilateral Distance:    Right Eye Near:   Left Eye Near:    Bilateral Near:     Physical Exam Vitals reviewed.  Constitutional:      General: She is  not in acute distress.    Appearance: Normal appearance. She is not ill-appearing.  HENT:     Head: Normocephalic and atraumatic.     Mouth/Throat:     Mouth: Mucous membranes are moist.     Comments: Moist mucous membranes Eyes:     Extraocular Movements: Extraocular movements intact.     Pupils: Pupils are equal, round, and reactive to light.  Cardiovascular:     Rate and Rhythm: Normal rate and regular rhythm.     Heart  sounds: Normal heart sounds.  Pulmonary:     Effort: Pulmonary effort is normal.     Breath sounds: Normal breath sounds. No wheezing, rhonchi or rales.  Abdominal:     General: Bowel sounds are normal. There is no distension.     Palpations: Abdomen is soft. There is no mass.     Tenderness: There is abdominal tenderness in the suprapubic area. There is no right CVA tenderness, left CVA tenderness, guarding or rebound.  Skin:    General: Skin is warm.     Capillary Refill: Capillary refill takes less than 2 seconds.     Comments: Good skin turgor  Neurological:     General: No focal deficit present.     Mental Status: She is alert and oriented to person, place, and time.  Psychiatric:        Mood and Affect: Mood normal.        Behavior: Behavior normal.     UC Treatments / Results  Labs (all labs ordered are listed, but only abnormal results are displayed) Labs Reviewed  POCT URINALYSIS DIP (MANUAL ENTRY) - Abnormal; Notable for the following components:      Result Value   Ketones, POC UA trace (5) (*)    Leukocytes, UA Small (1+) (*)    All other components within normal limits  URINE CULTURE    EKG   Radiology No results found.  Procedures Procedures (including critical care time)  Medications Ordered in UC Medications - No data to display  Initial Impression / Assessment and Plan / UC Course  I have reviewed the triage vital signs and the nursing notes.  Pertinent labs & imaging results that were available during my care of the  patient were reviewed by me and considered in my medical decision making (see chart for details).     This patient is a very pleasant 76 y.o. year old female presenting with acute cystitis. Last UTI 05/03/2021 managed with Keflex and culture showed sensitivity to this. Today she is afebrile but borderline tachy.   UA today with small leuk.  Culture sent .  Macrobid allergic. Was treated with Keflex 3 weeks ago as above. Also with decreased GFR last BMP, so will avoid Ciprofloxacin and Bactrim. Requires liquid abx. Will send Cefdinir as below. Good hydration. F/u with PCP to confirm resolution.  ED return precautions discussed. Patient verbalizes understanding and agreement.   Coding Level 4 for review of past notes/labs (UA taken by PCP), order and interpretation of labs today, and prescription drug management   Final Clinical Impressions(s) / UC Diagnoses   Final diagnoses:  Dysuria  Acute cystitis without hematuria  Recurrent UTI  Decreased GFR     Discharge Instructions      -Omnicef (Cefdinir) twice daily x5 days -Follow-up with PCP to confirm resolution -Seek additional medical attention if symptoms worsen instead of improve - abd pain, back pain, new fevers/chills, etc.      ED Prescriptions     Medication Sig Dispense Auth. Provider   cefdinir (OMNICEF) 250 MG/5ML suspension Take 6 mLs (300 mg total) by mouth 2 (two) times daily for 5 days. 60 mL Hazel Sams, PA-C      PDMP not reviewed this encounter.   Hazel Sams, PA-C 05/26/21 1028

## 2021-05-27 NOTE — Telephone Encounter (Signed)
Spoke to patient, who stated that she does have a cpap machine but she has a hard time wearing it. She also has a persistent cough. She is aware that Dr. Jayme Cloud does not treat OSA. I recommended that she see a sleep physician. Patient requested that I can pending appt and she will call back to schedule with sleep provider.  Nothing further needed at this time.  Routing to Dr. Jayme Cloud as an Lorain Childes.

## 2021-05-27 NOTE — Telephone Encounter (Signed)
Patient is aware of below message and voiced her understanding.  She will keep scheduled visit for 06/29/2021. Nothing further needed at this time.

## 2021-05-27 NOTE — Telephone Encounter (Signed)
We will be glad to see her for her cough.  We can maybe arrange for her to see Dr. Halford Chessman for her difficulties with sleep equipment.

## 2021-05-28 LAB — URINE CULTURE: Culture: >=100000 — AB

## 2021-06-02 ENCOUNTER — Ambulatory Visit (INDEPENDENT_AMBULATORY_CARE_PROVIDER_SITE_OTHER): Payer: PPO | Admitting: Gastroenterology

## 2021-06-02 ENCOUNTER — Other Ambulatory Visit: Payer: Self-pay

## 2021-06-02 ENCOUNTER — Encounter: Payer: Self-pay | Admitting: Gastroenterology

## 2021-06-02 VITALS — BP 145/75 | HR 88 | Temp 97.3°F | Wt 229.0 lb

## 2021-06-02 DIAGNOSIS — D500 Iron deficiency anemia secondary to blood loss (chronic): Secondary | ICD-10-CM

## 2021-06-02 MED ORDER — NA SULFATE-K SULFATE-MG SULF 17.5-3.13-1.6 GM/177ML PO SOLN: 1.0000 | Freq: Once | ORAL | 0 refills | Status: AC

## 2021-06-02 NOTE — Progress Notes (Signed)
Gastroenterology Consultation  Referring Provider:     Dale Gunbarrel, MD Primary Care Physician:  Dale Los Prados, MD Primary Gastroenterologist:  Dr. Servando Snare     Reason for Consultation:     Iron deficiency anemia        HPI:   Susan Miller is a 76 y.o. y/o female referred for consultation & management of iron deficiency anemia by Dr. Dale Waverly Hall, MD. this patient comes in today after seeing me back in 2018 for a EGD and colonoscopy.  The patient had significant esophagitis at the time of the upper endoscopy and a normal colonoscopy.  The patient was recently found to have anemia with her most recent blood work showing:  Component     Latest Ref Rng & Units 08/21/2019 02/20/2020 04/05/2021  Hemoglobin     12.0 - 15.0 g/dL 32.3 55.7 32.2 (L)  HCT     36.0 - 46.0 % 36.1 36.6 32.6 (L)    She also had iron studies that showed her ferritin to be low.  The ferritin was 6. She reports she takes Nexium and it is working. The patient denies any unexplained weight loss fevers chills black stools or bloody stools.  She also denies any abdominal pain.  Past Medical History:  Diagnosis Date   Allergy    Depression    History of chicken pox    Hx: UTI (urinary tract infection)    Hypertension     Past Surgical History:  Procedure Laterality Date   COLONOSCOPY WITH PROPOFOL N/A 06/20/2016   Procedure: COLONOSCOPY WITH PROPOFOL;  Surgeon: Midge Minium, MD;  Location: [facility];  Service: Endoscopy;  Laterality: N/A;   ESOPHAGOGASTRODUODENOSCOPY (EGD) WITH PROPOFOL N/A 06/20/2016   Procedure: ESOPHAGOGASTRODUODENOSCOPY (EGD) WITH PROPOFOL;  Surgeon: Midge Minium, MD;  Location: [facility]OS[facility];  Service: Endoscopy;  Laterality: N/A;   TONSILLECTOMY     as a child   TUMOR REMOVAL      Prior to Admission medications   Medication Sig Start Date End Date Taking? Authorizing Provider  conjugated estrogens (PREMARIN) vaginal cream Place 1 Applicatorful vaginally daily. Use pea sized  amount M-W-Fr before bedtime 02/12/19   Vanna Scotland, MD  Cyanocobalamin (VITAMIN B-12 IJ) Inject as directed. Weekly for 3 weeks and then monthly    [provider]  esomeprazole (NEXIUM) 40 MG capsule TAKE 1 CAPSULE DAILY AT 12 NOON 03/11/21   Dale Blanding, MD  ferrous sulfate (FEROSUL) 220 (44 Fe) MG/5ML solution Take 5 mLs (220 mg total) by mouth daily. 04/13/21   Dale Sibley, MD  hydrochlorothiazide (HYDRODIURIL) 25 MG tablet TAKE 1/2 TABLET EVERY DAY 04/18/21   Dale Parkway, MD  losartan (COZAAR) 25 MG tablet TAKE 1 TABLET(25 MG) BY MOUTH DAILY 11/19/20   Dale Groton, MD  predniSONE (STERAPRED UNI-PAK 21 TAB) 10 MG (21) TBPK tablet As directed 05/06/21   Worthy Rancher B, FNP  rosuvastatin (CRESTOR) 10 MG tablet TAKE 1 TABLET EVERY DAY 03/11/21   Dale [place], MD  sertraline (ZOLOFT) 25 MG tablet TAKE 1 TABLET EVERY DAY 01/12/21   Dale Wallace, MD    Family History  Problem Relation Age of Onset   Hypertension Mother    Cancer Sister        colon cancer   Hypertension Sister    Diabetes Sister    Hypertension Brother    Hypertension Maternal Aunt    Breast cancer Maternal Aunt 60   Hypertension Maternal Uncle    Stroke Maternal Grandmother  Hypertension Maternal Grandmother      Social History   Tobacco Use   Smoking status: Never   Smokeless tobacco: Never  Vaping Use   Vaping Use: Never used  Substance Use Topics   Alcohol use: No    Alcohol/week: 0.0 standard drinks   Drug use: No    Allergies as of 06/02/2021 - Review Complete 05/26/2021  Allergen Reaction Noted   Codeine Hives 12/13/2012   Iodinated contrast media Swelling 03/03/2015   Nitrofurantoin monohyd macro Nausea Only 03/03/2015    Review of Systems:    All systems reviewed and negative except where noted in HPI.   Physical Exam:  There were no vitals taken for this visit. No LMP recorded. Patient is postmenopausal. General:   Alert,  Well-developed, well-nourished,  pleasant and cooperative in NAD Head:  Normocephalic and atraumatic. Eyes:  Sclera clear, no icterus.   Conjunctiva pink. Ears:  Normal auditory acuity. Neck:  Supple; no masses or thyromegaly. Lungs:  Respirations even and unlabored.  Clear throughout to auscultation.   No wheezes, crackles, or rhonchi. No acute distress. Heart:  Regular rate and rhythm; no murmurs, clicks, rubs, or gallops. Abdomen:  Normal bowel sounds.  No bruits.  Soft, non-tender and non-distended without masses, hepatosplenomegaly or hernias noted.  No guarding or rebound tenderness.  Negative Carnett sign.   Rectal:  Deferred.  Pulses:  Normal pulses noted. Extremities:  No clubbing or edema.  No cyanosis. Neurologic:  Alert and oriented x3;  grossly normal neurologically. Skin:  Intact without significant lesions or rashes.  No jaundice. Lymph Nodes:  No significant cervical adenopathy. Psych:  Alert and cooperative. Normal mood and affect.  Imaging Studies: DG Chest 2 View  Result Date: 05/16/2021 CLINICAL DATA:  76 year old female with persisting cough EXAM: CHEST - 2 VIEW COMPARISON:  06/09/2020 FINDINGS: Cardiomediastinal silhouette unchanged in size and contour. No evidence of central vascular congestion. No interlobular septal thickening. No pneumothorax or pleural effusion. Coarsened interstitial markings, with no confluent airspace disease. No acute displaced fracture. Degenerative changes of the spine. IMPRESSION: Negative for acute cardiopulmonary disease Electronically Signed   By: Gilmer Mor D.O.   On: 05/16/2021 14:18    Assessment and Plan:   Susan Miller is a 76 y.o. y/o female who comes in today with a history of anemia.  The patient's last EGD and colonoscopy were in 2018.  The patient has been explained that iron deficiency can be from GI blood loss in the patient will be set up for an EGD and colonoscopy.  The patient has been explained the plan and agrees with it.  The patient will follow up  with time of the EGD and colonoscopy.    Midge Minium, MD. Clementeen Graham    Note: This dictation was prepared with Dragon dictation along with smaller phrase technology. Any transcriptional errors that result from this process are unintentional.

## 2021-06-02 NOTE — Addendum Note (Signed)
Addended by: Lurlean Nanny on: 06/02/2021 05:19 PM   Modules accepted: Orders

## 2021-06-08 ENCOUNTER — Other Ambulatory Visit: Payer: Self-pay

## 2021-06-08 ENCOUNTER — Ambulatory Visit
Admission: RE | Admit: 2021-06-08 | Discharge: 2021-06-08 | Disposition: A | Discharge status: Home / Self Care | Payer: PPO | Source: Ambulatory Visit | Attending: Emergency Medicine | Admitting: Emergency Medicine

## 2021-06-08 VITALS — BP 127/72 | HR 103 | Temp 98.2°F | Resp 18

## 2021-06-08 DIAGNOSIS — N390 Urinary tract infection, site not specified: Secondary | ICD-10-CM | POA: Diagnosis not present

## 2021-06-08 LAB — POCT URINALYSIS DIP (MANUAL ENTRY)
Bilirubin, UA: NEGATIVE
Glucose, UA: NEGATIVE mg/dL
Nitrite, UA: NEGATIVE
Spec Grav, UA: 1.015 (ref 1.010–1.025)
Urobilinogen, UA: 0.2 E.U./dL
pH, UA: 6.5 (ref 5.0–8.0)

## 2021-06-08 MED ORDER — SULFAMETHOXAZOLE-TRIMETHOPRIM 800-160 MG PO TABS: 1.0000 | ORAL_TABLET | Freq: Two times a day (BID) | ORAL | 0 refills | Status: DC

## 2021-06-08 MED ORDER — SULFAMETHOXAZOLE-TRIMETHOPRIM 200-40 MG/5ML PO SUSP: 160.0000 mg | Freq: Two times a day (BID) | ORAL | 0 refills | Status: AC

## 2021-06-08 NOTE — ED Provider Notes (Signed)
[name]    CSN: [id number] Arrival date & time: 06/08/21  [id number]      History   Chief Complaint Chief Complaint  Patient presents with   Dysuria    HPI Susan Miller is a 76 y.o. female.  Patient presents with dysuria since yesterday; This has improved some today.  She denies fever, chills, abdominal pain, flank pain, pelvic pain, or other symptoms.  Patient was seen by her PCP on 05/03/2021 for acute cystitis; treated with cephalexin.  Seen at this urgent care on 05/26/2021; diagnosed with acute cystitis and recurrent UTI and decreased GFR; treated with cefdinir.  The history is provided by the patient and medical records.   Past Medical History:  Diagnosis Date   Allergy    Depression    History of chicken pox    Hx: UTI (urinary tract infection)    Hypertension     Patient Active Problem List   Diagnosis Date Noted   Anemia 04/10/2021   Aortic atherosclerosis ([place]) 04/10/2021   UTI (urinary tract infection) 03/27/2021   Cutaneous skin tags 12/12/2020   B12 deficiency 12/12/2020   OSA (obstructive sleep apnea) 04/18/2020   Numbness and tingling of both feet 02/20/2020   Daytime hypersomnolence 01/29/2020   Dysuria 12/07/2019   Snoring 12/07/2019   Hypercholesteremia 10/09/2019   Back pain 11/15/2018   SOB (shortness of breath) 11/15/2018   Decreased GFR 11/14/2018   Hyperbilirubinemia 11/14/2018   Abdominal bloating 12/09/2017   Osteopenia 04/29/2017   Hyperglycemia 04/27/2017   Reflux esophagitis    Heartburn    Special screening for malignant neoplasms, colon    Hair loss 10/26/2015   Fatigue 10/21/2015   Vitamin D deficiency 03/15/2015   Weight loss counseling, encounter for 02/22/2015   Facial erythema 07/27/2014   Health care maintenance 07/05/2014   BP (high blood pressure) 03/26/2014   Headache 03/08/2014   Eyelid lesion 03/08/2014   Shingles 11/16/2013   Light headedness 06/22/2013   Cough 05/18/2013   GERD (gastroesophageal reflux  disease) 05/18/2013   Swelling of extremity 03/01/2013   Left knee pain 03/01/2013   Essential hypertension, benign 12/15/2012   Frequent UTI 12/15/2012   Mild depression 12/15/2012   Environmental allergies 12/15/2012    Past Surgical History:  Procedure Laterality Date   COLONOSCOPY WITH PROPOFOL N/A 06/20/2016   Procedure: COLONOSCOPY WITH PROPOFOL;  Surgeon: Lucilla Lame, MD;  Location: [facility];  Service: Endoscopy;  Laterality: N/A;   ESOPHAGOGASTRODUODENOSCOPY (EGD) WITH PROPOFOL N/A 06/20/2016   Procedure: ESOPHAGOGASTRODUODENOSCOPY (EGD) WITH PROPOFOL;  Surgeon: Lucilla Lame, MD;  Location: [facility];  Service: Endoscopy;  Laterality: N/A;   TONSILLECTOMY     as a child   TUMOR REMOVAL      OB History   No obstetric history on file.      Home Medications    Prior to Admission medications   Medication Sig Start Date End Date Taking? Authorizing Provider  sulfamethoxazole-trimethoprim (BACTRIM) 200-40 MG/5ML suspension Take 20 mLs by mouth 2 (two) times daily for 7 days. 06/08/21 06/15/21 Yes Sharion Balloon, NP  conjugated estrogens (PREMARIN) vaginal cream Place 1 Applicatorful vaginally daily. Use pea sized amount M-W-Fr before bedtime 02/12/19   Hollice Espy, MD  Cyanocobalamin (VITAMIN B-12 IJ) Inject as directed. Weekly for 3 weeks and then monthly Patient not taking: Reported on 06/02/2021    [provider]  esomeprazole (NEXIUM) 40 MG capsule TAKE 1 CAPSULE DAILY AT 12 NOON 03/11/21   Einar Pheasant, MD  ferrous sulfate (FEROSUL) 220 (44 Fe) MG/5ML solution Take 5 mLs (220 mg total) by mouth daily. 04/13/21   Einar Pheasant, MD  hydrochlorothiazide (HYDRODIURIL) 25 MG tablet TAKE 1/2 TABLET EVERY DAY 04/18/21   Einar Pheasant, MD  losartan (COZAAR) 25 MG tablet TAKE 1 TABLET(25 MG) BY MOUTH DAILY 11/19/20   Einar Pheasant, MD  rosuvastatin (CRESTOR) 10 MG tablet TAKE 1 TABLET EVERY DAY 03/11/21   Einar Pheasant, MD  sertraline (ZOLOFT) 25 MG tablet  TAKE 1 TABLET EVERY DAY 01/12/21   Einar Pheasant, MD    Family History Family History  Problem Relation Age of Onset   Hypertension Mother    Cancer Sister        colon cancer   Hypertension Sister    Diabetes Sister    Hypertension Brother    Hypertension Maternal Aunt    Breast cancer Maternal Aunt 60   Hypertension Maternal Uncle    Stroke Maternal Grandmother    Hypertension Maternal Grandmother     Social History Social History   Tobacco Use   Smoking status: Never   Smokeless tobacco: Never  Vaping Use   Vaping Use: Never used  Substance Use Topics   Alcohol use: No    Alcohol/week: 0.0 standard drinks   Drug use: No     Allergies   Codeine, Iodinated contrast media, and Nitrofurantoin monohyd macro   Review of Systems Review of Systems  Constitutional:  Negative for chills and fever.  Gastrointestinal:  Negative for abdominal pain, nausea and vomiting.  Genitourinary:  Positive for dysuria. Negative for frequency, hematuria, pelvic pain and vaginal discharge.  Skin:  Negative for color change and rash.  All other systems reviewed and are negative.   Physical Exam Triage Vital Signs ED Triage Vitals  Enc Vitals Group     BP 06/08/21 1013 127/72     Pulse Rate 06/08/21 1013 (!) 103     Resp 06/08/21 1013 18     Temp 06/08/21 1013 98.2 F (36.8 C)     Temp src --      SpO2 06/08/21 1013 96 %     Weight --      Height --      Head Circumference --      Peak Flow --      Pain Score 06/08/21 1024 0     Pain Loc --      Pain Edu? --      Excl. in [place]? --    No data found.  Updated Vital Signs BP 127/72 (BP Location: Left Arm)    Pulse (!) 103    Temp 98.2 F (36.8 C)    Resp 18    SpO2 96%   Visual Acuity Right Eye Distance:   Left Eye Distance:   Bilateral Distance:    Right Eye Near:   Left Eye Near:    Bilateral Near:     Physical Exam Vitals and nursing note reviewed.  Constitutional:      General: She is not in acute distress.     Appearance: She is well-developed. She is not ill-appearing.  HENT:     Mouth/Throat:     Mouth: Mucous membranes are moist.  Cardiovascular:     Rate and Rhythm: Normal rate and regular rhythm.     Heart sounds: Normal heart sounds.  Pulmonary:     Effort: Pulmonary effort is normal. No respiratory distress.     Breath sounds: Normal breath sounds.  Abdominal:  General: Bowel sounds are normal. There is no distension.     Palpations: Abdomen is soft.     Tenderness: There is no abdominal tenderness. There is no right CVA tenderness, left CVA tenderness, guarding or rebound.  Musculoskeletal:     Cervical back: Neck supple.  Skin:    General: Skin is warm and dry.  Neurological:     Mental Status: She is alert.  Psychiatric:        Mood and Affect: Mood normal.        Behavior: Behavior normal.     UC Treatments / Results  Labs (all labs ordered are listed, but only abnormal results are displayed) Labs Reviewed  POCT URINALYSIS DIP (MANUAL ENTRY) - Abnormal; Notable for the following components:      Result Value   Clarity, UA cloudy (*)    Ketones, POC UA trace (5) (*)    Blood, UA trace-intact (*)    Protein Ur, POC trace (*)    Leukocytes, UA Large (3+) (*)    All other components within normal limits  URINE CULTURE    EKG   Radiology No results found.  Procedures Procedures (including critical care time)  Medications Ordered in UC Medications - No data to display  Initial Impression / Assessment and Plan / UC Course  I have reviewed the triage vital signs and the nursing notes.  Pertinent labs & imaging results that were available during my care of the patient were reviewed by me and considered in my medical decision making (see chart for details).   UTI.  Patient was recently treated with cephalexin and cefdinir.  Treating today with Bactrim. Urine culture pending. Discussed with patient that we will call her if the urine culture shows the need to  change or discontinue the antibiotic. Instructed her to follow-up with her PCP next week. Patient agrees to plan of care.      Final Clinical Impressions(s) / UC Diagnoses   Final diagnoses:  Urinary tract infection without hematuria, site unspecified     Discharge Instructions      Take the antibiotic as directed.  The urine culture is pending.  We will call you if it shows the need to change or discontinue your antibiotic.    Follow up with your primary care provider.        ED Prescriptions     Medication Sig Dispense Auth. Provider   sulfamethoxazole-trimethoprim (BACTRIM DS) 800-160 MG tablet  (Status: Discontinued) Take 1 tablet by mouth 2 (two) times daily for 5 days. 10 tablet Sharion Balloon, NP   sulfamethoxazole-trimethoprim (BACTRIM) 200-40 MG/5ML suspension Take 20 mLs by mouth 2 (two) times daily for 7 days. 280 mL Sharion Balloon, NP      PDMP not reviewed this encounter.   Sharion Balloon, NP 06/08/21 1116

## 2021-06-08 NOTE — ED Triage Notes (Signed)
Pt states she had some dysuria yesterday she is feeling better today.

## 2021-06-08 NOTE — Discharge Instructions (Addendum)
Take the antibiotic as directed.  The urine culture is pending.  We will call you if it shows the need to change or discontinue your antibiotic.    Follow up with your primary care provider.    

## 2021-06-11 LAB — URINE CULTURE: Culture: >=100000 — AB

## 2021-06-15 ENCOUNTER — Telehealth: Payer: Self-pay | Admitting: Internal Medicine

## 2021-06-15 NOTE — Telephone Encounter (Signed)
Pt called in stating this is her third UTI and pt does not believe that the medicine is working. Pt want to have urine test done. Pt would like to be called

## 2021-06-15 NOTE — Telephone Encounter (Signed)
Patient finished bactrim today and has had 3 UTIS within the last month. This abx seemed to clear symptoms. Would like to recheck urine to confirm infection is cleared. Advised we typically repeat 2 weeks after abx are complete. Are you ok with this?

## 2021-06-16 ENCOUNTER — Other Ambulatory Visit: Payer: Self-pay | Admitting: Internal Medicine

## 2021-06-16 DIAGNOSIS — N390 Urinary tract infection, site not specified: Secondary | ICD-10-CM

## 2021-06-16 NOTE — Telephone Encounter (Signed)
If she has had 3 UTIs in the last month and is having issues with recurring UTIs, then she needs to see urology.  Recommend referral to urology for evaluation since recurring.  Can confirm with her who she prefers to see.

## 2021-06-16 NOTE — Progress Notes (Signed)
Order placed for referral to urology.  

## 2021-06-16 NOTE — Telephone Encounter (Signed)
Order placed for referral to urology.  

## 2021-06-16 NOTE — Telephone Encounter (Signed)
Patient aware of below and agreeable. Has seen Dr Apolinar Junes in the past

## 2021-06-22 NOTE — Progress Notes (Signed)
06/24/21 12:28 PM   Susan Miller [birth year] [id number]  Referring provider:  Dale Coupland, MD [address] [place],  [place] [address] Chief Complaint  Patient presents with   Recurrent UTI     HPI: Susan Miller is a 76 y.o.female with a personal history of recurrent UTI, atrophic vaginitis, urethral caruncle, and chronic constipation, who presents today for further evaluation of frequent UTI.   She was seen and evaluated on 01/06/2019 with urinary tract symptoms (increased urgency/ frequency) however her urine culture grew low colony count of mixed flora.  Her urinalysis at the time was fairly unremarkable with small leukocytes, 10-20, otherwise a completely unremarkable urinalysis.  She was last seen in clinic on 02/12/2019 for recurrent UTIs she was recommended and behavioral modifications.   She was recently seen in the ED on 06/17/2021 for dysuria. She was seen by her PCP on 05/03/2021 for acute cystitis; treated with cephalexin.  Seen at this urgent care on 05/26/2021; diagnosed with acute cystitis and recurrent UTI and decreased GFR; treated with cefdinir. Urinalysis had trace blood and large leukocytes. Urine culture grew E.coli. She was treated with bactrim.   She reports that she had burning bloating, urinary urgency. She denies any fevers. She reports that she has not been using her estrogen cream.   She reports that she uses wipes usually to clean herself.  These often have perfumes and or are medicated.  She does not feel infection today. She last used antibiotics several weeks ago.  PMH: Past Medical History:  Diagnosis Date   Allergy    Depression    History of chicken pox    Hx: UTI (urinary tract infection)    Hypertension     Surgical History: Past Surgical History:  Procedure Laterality Date   COLONOSCOPY WITH PROPOFOL N/A 06/20/2016   Procedure: COLONOSCOPY WITH PROPOFOL;  Surgeon: Midge Minium, MD;  Location: [facility];   Service: Endoscopy;  Laterality: N/A;   ESOPHAGOGASTRODUODENOSCOPY (EGD) WITH PROPOFOL N/A 06/20/2016   Procedure: ESOPHAGOGASTRODUODENOSCOPY (EGD) WITH PROPOFOL;  Surgeon: Midge Minium, MD;  Location: [facility];  Service: Endoscopy;  Laterality: N/A;   TONSILLECTOMY     as a child   TUMOR REMOVAL      Home Medications:  Allergies as of 06/23/2021       Reactions   Codeine Hives   Iodinated Contrast Media Swelling   Nitrofurantoin Monohyd Macro Nausea Only        Medication List        Accurate as of June 23, 2021 11:59 PM. If you have any questions, ask your nurse or doctor.          STOP taking these medications    Premarin vaginal cream Generic drug: conjugated estrogens Stopped by: Vanna Scotland, MD       TAKE these medications    esomeprazole 40 MG capsule Commonly known as: NEXIUM TAKE 1 CAPSULE DAILY AT 12 NOON   estradiol 0.1 MG/GM vaginal cream Commonly known as: ESTRACE Estrogen Cream Instruction Discard applicator Apply pea sized amount to tip of finger to urethra before bed. Wash hands well after application. Use Monday, Wednesday and Friday Started by: Vanna Scotland, MD   ferrous sulfate 220 (44 Fe) MG/5ML solution Commonly known as: FeroSul Take 5 mLs (220 mg total) by mouth daily.   hydrochlorothiazide 25 MG tablet Commonly known as: HYDRODIURIL TAKE 1/2 TABLET EVERY DAY   losartan 25 MG tablet Commonly known as: COZAAR TAKE 1 TABLET(25 MG) BY  MOUTH DAILY   Na Sulfate-K Sulfate-Mg Sulf 17.5-3.13-1.6 GM/177ML Soln See admin instructions. follow package directions   rosuvastatin 10 MG tablet Commonly known as: CRESTOR TAKE 1 TABLET EVERY DAY   sertraline 25 MG tablet Commonly known as: ZOLOFT TAKE 1 TABLET EVERY DAY   VITAMIN B-12 IJ Inject as directed. Weekly for 3 weeks and then monthly        Allergies:  Allergies  Allergen Reactions   Codeine Hives   Iodinated Contrast Media Swelling   Nitrofurantoin Monohyd  Macro Nausea Only    Family History: Family History  Problem Relation Age of Onset   Hypertension Mother    Cancer Sister        colon cancer   Hypertension Sister    Diabetes Sister    Hypertension Brother    Hypertension Maternal Aunt    Breast cancer Maternal Aunt 60   Hypertension Maternal Uncle    Stroke Maternal Grandmother    Hypertension Maternal Grandmother     Social History:  reports that she has never smoked. She has never used smokeless tobacco. She reports that she does not drink alcohol and does not use drugs.   Physical Exam: BP 114/76    Pulse (!) 114    Ht 5\' 7"  (1.702 m)    Wt 228 lb (103.4 kg)    BMI 35.71 kg/m   Constitutional:  Alert and oriented, No acute distress. HEENT: [place] AT, moist mucus membranes.  Trachea midline, no masses. Cardiovascular: No clubbing, cyanosis, or edema. Respiratory: Normal respiratory effort, no increased work of breathing. Skin: No rashes, bruises or suspicious lesions. Neurologic: Grossly intact, no focal deficits, moving all 4 extremities. Psychiatric: Normal mood and affect.  Laboratory Data: Lab Results  Component Value Date   CREATININE 0.98 04/05/2021   Lab Results  Component Value Date   HGBA1C 6.6 (H) 04/05/2021    Urinalysis >30 WBCs and >30 epithelial cells   Assessment & Plan:    Recurrent UTIs - Strongly recommend she start back and continue topical vaginal estrogen cream. - Refill sent today  - Discussed hygiene management. Recommend saline wipes if she would like to use wipes.  - Counseled her in UTI prevention supplements such as cranberry tablets, probiotics, yogurt that has active lactobacillus culture, and d-mannose  -Urinalysis today appears contaminated with multiple squamous cells and she is otherwise asymptomatic, as such, will not send a culture and or treat -Additionally, she reports that she often will go back and have her urine rechecked to make sure that it is clear, advised her against  doing this and treat symptomatic infections only not positive urinalyses, concept of chronic bacteriuria discussed  F/u prn  04/07/2021 as a scribe for Tawni Millers, MD.,have documented all relevant documentation on the behalf of Vanna Scotland, MD,as directed by  Vanna Scotland, MD while in the presence of Vanna Scotland, MD.   [facility] [address] [place], [place] [phone]

## 2021-06-23 ENCOUNTER — Ambulatory Visit: Payer: PPO | Admitting: Urology

## 2021-06-23 ENCOUNTER — Other Ambulatory Visit: Payer: Self-pay

## 2021-06-23 VITALS — BP 114/76 | HR 114 | Ht 67.0 in | Wt 228.0 lb

## 2021-06-23 DIAGNOSIS — N390 Urinary tract infection, site not specified: Secondary | ICD-10-CM | POA: Diagnosis not present

## 2021-06-23 LAB — MICROSCOPIC EXAMINATION
Epithelial Cells (non renal): >10 /hpf — ABNORMAL HIGH (ref 0–10)
RBC, Urine: NONE SEEN /hpf (ref 0–2)
WBC, UA: >30 /hpf — ABNORMAL HIGH (ref 0–5)

## 2021-06-23 LAB — URINALYSIS, COMPLETE
Bilirubin, UA: NEGATIVE
Glucose, UA: NEGATIVE
Nitrite, UA: NEGATIVE
Protein,UA: NEGATIVE
RBC, UA: NEGATIVE
Specific Gravity, UA: 1.02 (ref 1.005–1.030)
Urobilinogen, Ur: 0.2 mg/dL (ref 0.2–1.0)
pH, UA: 6.5 (ref 5.0–7.5)

## 2021-06-23 MED ORDER — ESTRADIOL 0.1 MG/GM VA CREA: TOPICAL_CREAM | VAGINAL | 12 refills | Status: DC

## 2021-06-23 NOTE — Patient Instructions (Addendum)
Cranberry tablets as directed. Probiotics as directed. D-Mannose as directed    Use Saline wipes only ( should be near the baby aisle)    Estrogen Cream Instruction Discard applicator Apply pea sized amount to tip of finger to urethra before bed. Wash hands well after application. Use Monday, Wednesday and Friday

## 2021-06-29 ENCOUNTER — Ambulatory Visit: Payer: PPO | Admitting: Pulmonary Disease

## 2021-06-29 ENCOUNTER — Encounter: Payer: Self-pay | Admitting: Gastroenterology

## 2021-06-29 ENCOUNTER — Other Ambulatory Visit: Payer: Self-pay

## 2021-06-29 ENCOUNTER — Encounter: Payer: Self-pay | Admitting: Pulmonary Disease

## 2021-06-29 VITALS — BP 126/82 | HR 91 | Temp 98.1°F | Ht 67.0 in | Wt 223.0 lb

## 2021-06-29 DIAGNOSIS — Z8616 Personal history of COVID-19: Secondary | ICD-10-CM

## 2021-06-29 DIAGNOSIS — R0602 Shortness of breath: Secondary | ICD-10-CM

## 2021-06-29 DIAGNOSIS — R059 Cough, unspecified: Secondary | ICD-10-CM | POA: Diagnosis not present

## 2021-06-29 NOTE — Patient Instructions (Signed)
Start taking some Zyrtec (cetirizine) 10 mg at bedtime to see if this helps with your cough.  We are going to get some breathing tests this will tell me about any potential inflammation in your airways.  When you start coughing please use either some hard candy or cough drops without mint or menthol in them to see if we can control the coughing cycle.  You may also use some sips of water.  We will see you in follow-up in 4 to 6 weeks time call sooner should any new problems arise.

## 2021-06-30 ENCOUNTER — Ambulatory Visit
Admission: RE | Admit: 2021-06-30 | Discharge: 2021-06-30 | Disposition: A | Discharge status: Home / Self Care | Payer: PPO | Attending: Gastroenterology | Admitting: Gastroenterology

## 2021-06-30 ENCOUNTER — Ambulatory Visit: Payer: PPO | Admitting: Anesthesiology

## 2021-06-30 ENCOUNTER — Encounter: Payer: Self-pay | Admitting: Gastroenterology

## 2021-06-30 ENCOUNTER — Encounter
Admission: RE | Disposition: A | Discharge status: Home / Self Care | Payer: Self-pay | Source: Home / Self Care | Attending: Gastroenterology

## 2021-06-30 DIAGNOSIS — Z538 Procedure and treatment not carried out for other reasons: Secondary | ICD-10-CM | POA: Diagnosis not present

## 2021-06-30 DIAGNOSIS — D509 Iron deficiency anemia, unspecified: Secondary | ICD-10-CM | POA: Insufficient documentation

## 2021-06-30 DIAGNOSIS — D500 Iron deficiency anemia secondary to blood loss (chronic): Secondary | ICD-10-CM

## 2021-06-30 SURGERY — COLONOSCOPY WITH PROPOFOL
Anesthesia: General

## 2021-06-30 MED ORDER — SODIUM CHLORIDE 0.9 % IV SOLN: INTRAVENOUS | Status: DC

## 2021-06-30 NOTE — Progress Notes (Signed)
Patient still having brown stools and she could only drink 1/3 of the second bottle of prep. Explained risks of not being cleaned out and possibly aborting procedure after start. She made the decision to leave and reschedule and would prefer the gallon of prep next time. Dr. Servando Snare aware. Patient will ask for the gallon of prep when office calls.

## 2021-06-30 NOTE — Anesthesia Preprocedure Evaluation (Deleted)
Anesthesia Evaluation  Patient identified by MRN, date of birth, ID band Patient awake    Reviewed: Allergy & Precautions, NPO status , Patient's Chart, lab work & pertinent test results  History of Anesthesia Complications Negative for: history of anesthetic complications  Airway Mallampati: II   Neck ROM: Full    Dental   Missing few molars:   Pulmonary  COVID 12/2020 with residual cough   Pulmonary exam normal breath sounds clear to auscultation       Cardiovascular hypertension, Normal cardiovascular exam Rhythm:Regular Rate:Normal     Neuro/Psych PSYCHIATRIC DISORDERS Depression negative neurological ROS     GI/Hepatic GERD  ,  Endo/Other  Obesity   Renal/GU negative Renal ROS     Musculoskeletal   Abdominal   Peds  Hematology  (+) Blood dyscrasia, anemia ,   Anesthesia Other Findings   Reproductive/Obstetrics                            Anesthesia Physical Anesthesia Plan  ASA: 2  Anesthesia Plan: General   Post-op Pain Management:    Induction: Intravenous  PONV Risk Score and Plan: 3 and Propofol infusion, TIVA and Treatment may vary due to age or medical condition  Airway Management Planned: Natural Airway  Additional Equipment:   Intra-op Plan:   Post-operative Plan:   Informed Consent: I have reviewed the patients History and Physical, chart, labs and discussed the procedure including the risks, benefits and alternatives for the proposed anesthesia with the patient or authorized representative who has indicated his/her understanding and acceptance.       Plan Discussed with: CRNA  Anesthesia Plan Comments: (LMA/GETA backup discussed.  Patient consented for risks of anesthesia including but not limited to:  - adverse reactions to medications - damage to eyes, teeth, lips or other oral mucosa - nerve damage due to positioning  - sore throat or hoarseness -  damage to heart, brain, nerves, lungs, other parts of body or loss of life  Informed patient about role of CRNA in peri- and intra-operative care.  Patient voiced understanding.)        Anesthesia Quick Evaluation

## 2021-07-07 ENCOUNTER — Other Ambulatory Visit: Payer: PPO

## 2021-07-07 ENCOUNTER — Other Ambulatory Visit
Admit: 2021-07-07 | Discharge: 2021-07-07 | Disposition: A | Discharge status: Home / Self Care | Payer: PPO | Attending: Pulmonary Disease | Admitting: Pulmonary Disease

## 2021-07-07 ENCOUNTER — Other Ambulatory Visit: Payer: Self-pay

## 2021-07-07 DIAGNOSIS — Z01812 Encounter for preprocedural laboratory examination: Secondary | ICD-10-CM | POA: Insufficient documentation

## 2021-07-07 DIAGNOSIS — Z20822 Contact with and (suspected) exposure to covid-19: Secondary | ICD-10-CM | POA: Diagnosis not present

## 2021-07-08 ENCOUNTER — Ambulatory Visit: Payer: PPO | Attending: Pulmonary Disease

## 2021-07-08 DIAGNOSIS — R0600 Dyspnea, unspecified: Secondary | ICD-10-CM | POA: Diagnosis not present

## 2021-07-08 DIAGNOSIS — R059 Cough, unspecified: Secondary | ICD-10-CM | POA: Diagnosis not present

## 2021-07-08 LAB — SARS CORONAVIRUS 2 (TAT 6-24 HRS): SARS Coronavirus 2: NEGATIVE

## 2021-07-08 MED ORDER — ALBUTEROL SULFATE (2.5 MG/3ML) 0.083% IN NEBU
2.5000 mg | INHALATION_SOLUTION | Freq: Once | RESPIRATORY_TRACT | Status: AC
  Administered 2021-07-08: 2.5 mg via RESPIRATORY_TRACT
  Filled 2021-07-08: qty 3

## 2021-07-12 ENCOUNTER — Other Ambulatory Visit: Payer: Self-pay

## 2021-07-12 MED ORDER — FLUTICASONE FUROATE-VILANTEROL 100-25 MCG/ACT IN AEPB: 1.0000 | INHALATION_SPRAY | Freq: Every day | RESPIRATORY_TRACT | 11 refills | Status: DC

## 2021-07-13 ENCOUNTER — Encounter: Payer: Self-pay | Admitting: Pulmonary Disease

## 2021-07-13 NOTE — Progress Notes (Signed)
Subjective:    [name], female    DOB: July 13, 1945, 76 y.o.   MRN: [id number] Patient Care Team: Einar Pheasant, MD as PCP - General (Internal Medicine) Kate Sable, MD as PCP - Cardiology (Cardiology)  Chief Complaint  Patient presents with   Follow-up   HPI Patient is a 76 year old lifelong never smoker who has been following with Dr. Patricia Pesa for the issue of obstructive sleep apnea.  Her sleep apnea is very mild and she has very low symptom burden.  He was unable to tolerate CPAP and discontinued this.  She presents today specifically for evaluation of cough and shortness of breath that have developed after a bout of COVID-19 in August 2022.  She has had issues with chronic sinus infections and is followed by Dr. Pryor Ochoa for these issues.  Previously, control of her sinus disease also controlled her cough.  However after COVID-19 this has become more of a pronounced issue.  She noted previously that she had seasonal variation to the symptoms now they appear to be almost constant.  Since CT of the sinuses in April 2022 showed that her sinuses were normally aerated.  She does have some mild nasal septal deviation towards the right.  Recently she has had no fevers, chills or sweats.  Usually no sputum production when she has cough however occasionally will have yellowish to thick whitish sputum.  No hemoptysis.  Shortness of breath is more noticeable on inclines and heavy exertion.  She does well on level ground.  She does have issues with gastroesophageal reflux symptoms but states that these have been controlled of late on Nexium.  She does however occasionally note that her cough gets worse after a meal.  Symptoms became more pronounced after a bout with flu December 2022.  She does have issues with recurrent UTIs and is followed by urology.  In the past was treated with Macrodantin for a short period of time however the patient developed nausea with this medication  and this medication has been avoided.  She had been on an ACE inhibitor and this initially improved the cough however after her COVID diagnosis her cough worsened again.  She does not endorse any other symptomatology today.  She has not had any chest pain, paroxysmal nocturnal dyspnea, orthopnea or lower extremity edema.  No calf tenderness.   Review of Systems A 10 point review of systems was performed and it is as noted above otherwise negative.  Past Medical History:  Diagnosis Date   Allergy    Depression    History of chicken pox    Hx: UTI (urinary tract infection)    Hypertension    Past Surgical History:  Procedure Laterality Date   COLONOSCOPY WITH PROPOFOL N/A 06/20/2016   Procedure: COLONOSCOPY WITH PROPOFOL;  Surgeon: Lucilla Lame, MD;  Location: [facility] ENDOSCOPY;  Service: Endoscopy;  Laterality: N/A;   ESOPHAGOGASTRODUODENOSCOPY (EGD) WITH PROPOFOL N/A 06/20/2016   Procedure: ESOPHAGOGASTRODUODENOSCOPY (EGD) WITH PROPOFOL;  Surgeon: Lucilla Lame, MD;  Location: [facility] ENDOSCOPY;  Service: Endoscopy;  Laterality: N/A;   TONSILLECTOMY     as a child   TUMOR REMOVAL     Patient Active Problem List   Diagnosis Date Noted   Anemia 04/10/2021   Aortic atherosclerosis ([place]) 04/10/2021   UTI (urinary tract infection) 03/27/2021   Cutaneous skin tags 12/12/2020   B12 deficiency 12/12/2020   OSA (obstructive sleep apnea) 04/18/2020   Numbness and tingling of both feet 02/20/2020   Daytime hypersomnolence  01/29/2020   Dysuria 12/07/2019   Snoring 12/07/2019   Hypercholesteremia 10/09/2019   Back pain 11/15/2018   SOB (shortness of breath) 11/15/2018   Decreased GFR 11/14/2018   Hyperbilirubinemia 11/14/2018   Abdominal bloating 12/09/2017   Osteopenia 04/29/2017   Hyperglycemia 04/27/2017   Reflux esophagitis    Heartburn    Special screening for malignant neoplasms, colon    Hair loss 10/26/2015   Fatigue 10/21/2015   Vitamin D deficiency 03/15/2015   Weight loss  counseling, encounter for 02/22/2015   Facial erythema 07/27/2014   Health care maintenance 07/05/2014   BP (high blood pressure) 03/26/2014   Headache 03/08/2014   Eyelid lesion 03/08/2014   Shingles 11/16/2013   Light headedness 06/22/2013   Cough 05/18/2013   GERD (gastroesophageal reflux disease) 05/18/2013   Swelling of extremity 03/01/2013   Left knee pain 03/01/2013   Essential hypertension, benign 12/15/2012   Frequent UTI 12/15/2012   Mild depression 12/15/2012   Environmental allergies 12/15/2012   Family History  Problem Relation Age of Onset   Hypertension Mother    Cancer Sister        colon cancer   Hypertension Sister    Diabetes Sister    Hypertension Brother    Hypertension Maternal Aunt    Breast cancer Maternal Aunt 60   Hypertension Maternal Uncle    Stroke Maternal Grandmother    Hypertension Maternal Grandmother    Social History   Tobacco Use   Smoking status: Never   Smokeless tobacco: Never  Substance Use Topics   Alcohol use: No    Alcohol/week: 0.0 standard drinks   Allergies  Allergen Reactions   Codeine Hives   Iodinated Contrast Media Swelling   Nitrofurantoin Monohyd Macro Nausea Only    Current Meds  Medication Sig   Cyanocobalamin (VITAMIN B-12 IJ) Inject as directed. Weekly for 3 weeks and then monthly   esomeprazole (NEXIUM) 40 MG capsule TAKE 1 CAPSULE DAILY AT 12 NOON   estradiol (ESTRACE) 0.1 MG/GM vaginal cream Estrogen Cream Instruction Discard applicator Apply pea sized amount to tip of finger to urethra before bed. Wash hands well after application. Use Monday, Wednesday and Friday   ferrous sulfate (FEROSUL) 220 (44 Fe) MG/5ML solution Take 5 mLs (220 mg total) by mouth daily.   hydrochlorothiazide (HYDRODIURIL) 25 MG tablet TAKE 1/2 TABLET EVERY DAY   losartan (COZAAR) 25 MG tablet TAKE 1 TABLET(25 MG) BY MOUTH DAILY   Na Sulfate-K Sulfate-Mg Sulf 17.5-3.13-1.6 GM/177ML SOLN See admin instructions. follow package  directions   rosuvastatin (CRESTOR) 10 MG tablet TAKE 1 TABLET EVERY DAY   sertraline (ZOLOFT) 25 MG tablet TAKE 1 TABLET EVERY DAY       Objective:   Physical Exam BP 126/82 (BP Location: Left Arm, Patient Position: Sitting, Cuff Size: Normal)    Pulse 91    Temp 98.1 F (36.7 C) (Oral)    Ht 5\' 7"  (1.702 m)    Wt 223 lb (101.2 kg)    SpO2 98%    BMI 34.93 kg/m  GENERAL: Well-developed, overweight, no acute distress, HEAD: Normocephalic, atraumatic.  EYES: Pupils equal, round, reactive to light.  No scleral icterus.  MOUTH: Nose/mouth/throat not examined due to masking requirements for COVID 19. NECK: Supple. No thyromegaly. Trachea midline. No JVD.  No adenopathy. PULMONARY: Good air entry bilaterally.  No adventitious sounds. CARDIOVASCULAR: S1 and S2. Regular rate and rhythm.  ABDOMEN: Benign. MUSCULOSKELETAL: No joint deformity, no clubbing, no edema.  NEUROLOGIC: No overt focal  deficit, no gait disturbance, speech is fluent. SKIN: Intact,warm,dry. PSYCH: Mood and behavior normal.  Chest x-ray performed 16 May 2021 showed no acute infiltrate, my review mild increased interstitial markings, mild hyperinflation:     Assessment & Plan:     ICD-10-CM   1. Cough, unspecified type  R05.9    May indicate airways reactivity Commonly seen after COVID 19 Patient currently not on ACE inhibitors Recommend Zyrtec at bedtime    2. Shortness of breath  R06.02 Pulmonary Function Test [facility] Only   Will obtain PFTs to better characterize Worse after COVID-19    3. Personal history of COVID-19  Z86.16    This issue adds complexity to her management     Orders Placed This Encounter  Procedures   Pulmonary Function Test [facility] Only    Standing Status:   Future    Number of Occurrences:   1    Standing Expiration Date:   06/29/2022    Order Specific Question:   Full PFT: includes the following: basic spirometry, spirometry pre & post bronchodilator, diffusion capacity (DLCO), lung  volumes    Answer:   Full PFT    Order Specific Question:   This test can only be performed at    Answer:   [facility]   Patient has had issues post COVID-19 worsened after flu in December 2022.  She could very well have airways reactivity.  However she also has lingering shortness of breath and this warrants pulmonary function testing.  We will proceed with the same.  Her symptoms appear to be seasonal and related to sinus issues I recommend she take Zyrtec at bedtime to see if this helps her cough.  We gave also patient instructions on cyclical cough.  Discussed strategies to break the cycle of coughing.  We will see the patient in follow-up in 4 to 6 weeks time she is to contact us prior to that time should any new difficulties arise.  Renold Don, MD Advanced Bronchoscopy PCCM [facility]    *This note was dictated using voice recognition software/Dragon.  Despite best efforts to proofread, errors can occur which can change the meaning. Any transcriptional errors that result from this process are unintentional and may not be fully corrected at the time of dictation.

## 2021-07-13 NOTE — Progress Notes (Signed)
Please see note of 06/29/2021. ?

## 2021-07-19 DIAGNOSIS — G4733 Obstructive sleep apnea (adult) (pediatric): Secondary | ICD-10-CM | POA: Diagnosis not present

## 2021-08-06 ENCOUNTER — Other Ambulatory Visit: Payer: Self-pay | Admitting: Internal Medicine

## 2021-08-09 ENCOUNTER — Ambulatory Visit: Payer: PPO | Admitting: Pulmonary Disease

## 2021-08-09 ENCOUNTER — Encounter: Payer: Self-pay | Admitting: Pulmonary Disease

## 2021-08-09 VITALS — BP 120/70 | HR 89 | Temp 97.9°F | Ht 67.0 in | Wt 227.0 lb

## 2021-08-09 DIAGNOSIS — J329 Chronic sinusitis, unspecified: Secondary | ICD-10-CM | POA: Diagnosis not present

## 2021-08-09 DIAGNOSIS — J310 Chronic rhinitis: Secondary | ICD-10-CM | POA: Diagnosis not present

## 2021-08-09 DIAGNOSIS — R053 Chronic cough: Secondary | ICD-10-CM

## 2021-08-09 MED ORDER — MONTELUKAST SODIUM 10 MG PO TABS: 10.0000 mg | ORAL_TABLET | Freq: Every day | ORAL | 6 refills | Status: DC

## 2021-08-09 MED ORDER — ALBUTEROL SULFATE HFA 108 (90 BASE) MCG/ACT IN AERS: 2.0000 | INHALATION_SPRAY | Freq: Four times a day (QID) | RESPIRATORY_TRACT | 2 refills | Status: DC | PRN

## 2021-08-09 NOTE — Progress Notes (Signed)
Subjective:    [name], female    DOB: 04/28/1946, 76 y.o.   MRN: [id number] Patient Care Team: Dale , MD as PCP - General (Internal Medicine) Debbe Odea, MD as PCP - Cardiology (Cardiology)  Chief Complaint  Patient presents with   Follow-up    C/o non prod cough and occ sob with exertion.      HPI Patient is a 76 year old lifelong never smoker who has been following with Dr. Santiago Glad for the issue of obstructive sleep apnea.  Her sleep apnea is very mild and she has very low symptom burden.  He was unable to tolerate CPAP and discontinued this.  I initially evaluated her in 29 June 2021 due to a chronic cough after COVID-19 in August 2022.  She has had issues with chronic sinus infections and is followed by Dr. Andee Poles for these issues.  Previously, control of her sinus disease also controlled her cough.  However after COVID-19 this has become more of a pronounced issue.  She noted previously that she had seasonal variation to the symptoms now they appear to be almost constant.  Since CT of the sinuses in April 2022 showed that her sinuses were normally aerated.  She does have some mild nasal septal deviation towards the right.  Recently she has had no fevers, chills or sweats.  Usually no sputum production when she has cough however occasionally will have yellowish to thick whitish sputum.  No hemoptysis.  Shortness of breath is more noticeable on inclines and heavy exertion.  She does well on level ground.  She does have issues with gastroesophageal reflux symptoms but states that these have been controlled of late on Nexium.  She does however occasionally note that her cough gets worse after a meal.  Symptoms became more pronounced after a bout with flu December 2022.  After she had PFTs on 08 July 2021 Virgel Bouquet was prescribed for her.  This was due to evidence of small airways reversible component.  She has done well with the [facility] however she cannot  afford it and would like to have a trial off of it.   She does not endorse any other symptomatology today.  She has not had any chest pain, paroxysmal nocturnal dyspnea, orthopnea or lower extremity edema.  No calf tenderness.  DATA 05/16/2021 CXR: No acute cardiopulmonary disease 07/08/2021 PFTs: FEV1 2.10 L or 87% predicted, FVC 2.62 L or 82% predicted, FEV1/FVC 80%, there is a mild bronchodilator response.  Lung volumes were normal except for ERV at 12% this is indicative of obesity.  There is a significant small airways component.  Diffusion capacity was normal.  Consistent with reactive airways disease/asthma    Review of Systems A 10 point review of systems was performed and it is as noted above otherwise negative.  Patient Active Problem List   Diagnosis Date Noted   Anemia 04/10/2021   Aortic atherosclerosis ([facility]) 04/10/2021   UTI (urinary tract infection) 03/27/2021   Cutaneous skin tags 12/12/2020   B12 deficiency 12/12/2020   OSA (obstructive sleep apnea) 04/18/2020   Numbness and tingling of both feet 02/20/2020   Daytime hypersomnolence 01/29/2020   Dysuria 12/07/2019   Snoring 12/07/2019   Hypercholesteremia 10/09/2019   Back pain 11/15/2018   SOB (shortness of breath) 11/15/2018   Decreased GFR 11/14/2018   Hyperbilirubinemia 11/14/2018   Abdominal bloating 12/09/2017   Osteopenia 04/29/2017   Hyperglycemia 04/27/2017   Reflux esophagitis    Heartburn  Special screening for malignant neoplasms, colon    Hair loss 10/26/2015   Fatigue 10/21/2015   Vitamin D deficiency 03/15/2015   Weight loss counseling, encounter for 02/22/2015   Facial erythema 07/27/2014   Health care maintenance 07/05/2014   BP (high blood pressure) 03/26/2014   Headache 03/08/2014   Eyelid lesion 03/08/2014   Shingles 11/16/2013   Light headedness 06/22/2013   Cough 05/18/2013   GERD (gastroesophageal reflux disease) 05/18/2013   Swelling of extremity 03/01/2013   Left knee pain  03/01/2013   Essential hypertension, benign 12/15/2012   Frequent UTI 12/15/2012   Mild depression 12/15/2012   Environmental allergies 12/15/2012   Social History   Tobacco Use   Smoking status: Never   Smokeless tobacco: Never  Substance Use Topics   Alcohol use: No    Alcohol/week: 0.0 standard drinks   Allergies  Allergen Reactions   Codeine Hives   Iodinated Contrast Media Swelling   Nitrofurantoin Monohyd Macro Nausea Only   Current Meds  Medication Sig   Cyanocobalamin (VITAMIN B-12 IJ) Inject as directed. Weekly for 3 weeks and then monthly   esomeprazole (NEXIUM) 40 MG capsule TAKE 1 CAPSULE DAILY AT 12 NOON   estradiol (ESTRACE) 0.1 MG/GM vaginal cream Estrogen Cream Instruction Discard applicator Apply pea sized amount to tip of finger to urethra before bed. Wash hands well after application. Use Monday, Wednesday and Friday   ferrous sulfate (FEROSUL) 220 (44 Fe) MG/5ML solution Take 5 mLs (220 mg total) by mouth daily.   fluticasone furoate-vilanterol (BREO ELLIPTA) 100-25 MCG/ACT AEPB Inhale 1 puff into the lungs daily.   hydrochlorothiazide (HYDRODIURIL) 25 MG tablet TAKE 1/2 TABLET EVERY DAY   losartan (COZAAR) 25 MG tablet TAKE 1 TABLET(25 MG) BY MOUTH DAILY   Na Sulfate-K Sulfate-Mg Sulf 17.5-3.13-1.6 GM/177ML SOLN See admin instructions. follow package directions   rosuvastatin (CRESTOR) 10 MG tablet TAKE 1 TABLET EVERY DAY   sertraline (ZOLOFT) 25 MG tablet TAKE 1 TABLET EVERY DAY   Immunization History  Administered Date(s) Administered   Fluad Quad(high Dose 65+) 01/31/2019, 01/29/2020   Influenza, High Dose Seasonal PF 01/11/2016, 04/27/2017, 01/18/2018   Influenza,inj,Quad PF,6+ Mos 02/24/2013, 02/27/2014, 02/22/2015   PFIZER(Purple Top)SARS-COV-2 Vaccination 06/23/2019, 07/14/2019   Pneumococcal Conjugate-13 11/06/2016   Pneumococcal Polysaccharide-23 12/06/2017       Objective:   Physical Exam BP 120/70 (BP Location: Left Arm, Cuff Size:  Normal)   Pulse 89   Temp 97.9 F (36.6 C) (Temporal)   Ht 5\' 7"  (1.702 m)   Wt 227 lb (103 kg)   SpO2 96%   BMI 35.55 kg/m   SpO2: 96 % O2 Device: None (Room air)   GENERAL: Well-developed, overweight, no acute distress, HEAD: Normocephalic, atraumatic.  EYES: Pupils equal, round, reactive to light.  No scleral icterus.  MOUTH: Nose/mouth/throat not examined due to masking requirements for COVID 19. NECK: Supple. No thyromegaly. Trachea midline. No JVD.  No adenopathy. PULMONARY: Good air entry bilaterally.  No adventitious sounds. CARDIOVASCULAR: S1 and S2. Regular rate and rhythm.  ABDOMEN: Benign. MUSCULOSKELETAL: No joint deformity, no clubbing, no edema.  NEUROLOGIC: No overt focal deficit, no gait disturbance, speech is fluent. SKIN: Intact,warm,dry. PSYCH: Mood and behavior normal.       Assessment & Plan:     ICD-10-CM   1. Chronic cough  R05.3    Suspect cough variant asthma Patient would like trial off of Breo due to cost Continue as needed albuterol Keep diary of how often she is using  albuterol    2. Chronic rhinosinusitis  J31.0    J32.9    Montelukast Nasal hygiene Continue regimen as recommended by ENT     Meds ordered this encounter  Medications   montelukast (SINGULAIR) 10 MG tablet    Sig: Take 1 tablet (10 mg total) by mouth at bedtime.    Dispense:  30 tablet    Refill:  6   albuterol (VENTOLIN HFA) 108 (90 Base) MCG/ACT inhaler    Sig: Inhale 2 puffs into the lungs every 6 (six) hours as needed for wheezing or shortness of breath.    Dispense:  8 g    Refill:  2   Will see the patient in follow-up in 6 to 8 weeks time call sooner should any new problems arise.  Gailen Shelter, MD Advanced Bronchoscopy PCCM [facility]    *This note was dictated using voice recognition software/Dragon.  Despite best efforts to proofread, errors can occur which can change the meaning. Any transcriptional errors that result from  this process are unintentional and may not be fully corrected at the time of dictation.

## 2021-08-09 NOTE — Patient Instructions (Signed)
You may stop the [facility] after you finish your current prescription. ? ?Have sent a prescription for Singulair 1 tablet daily.  Take your Claritin in the evening. ? ?We also sent a prescription of albuterol to use as needed.  It seems like this helped you in the past. ? ?Keep a diary of how many times you have to use the albuterol this will help [place] when you come back to see what would be the best medication for you. ? ?See you in follow-up in 6 to 8 weeks time call sooner should any new problems arise. ?

## 2021-08-19 DIAGNOSIS — G4733 Obstructive sleep apnea (adult) (pediatric): Secondary | ICD-10-CM | POA: Diagnosis not present

## 2021-08-22 ENCOUNTER — Other Ambulatory Visit: Payer: Self-pay | Admitting: Internal Medicine

## 2021-09-18 ENCOUNTER — Other Ambulatory Visit: Payer: Self-pay | Admitting: Internal Medicine

## 2021-09-18 DIAGNOSIS — G4733 Obstructive sleep apnea (adult) (pediatric): Secondary | ICD-10-CM | POA: Diagnosis not present

## 2021-10-11 ENCOUNTER — Encounter: Payer: Self-pay | Admitting: Pulmonary Disease

## 2021-10-11 ENCOUNTER — Ambulatory Visit: Payer: PPO | Admitting: Pulmonary Disease

## 2021-10-11 VITALS — BP 118/72 | HR 97 | Temp 97.9°F | Ht 67.0 in | Wt 229.2 lb

## 2021-10-11 DIAGNOSIS — Z8616 Personal history of COVID-19: Secondary | ICD-10-CM

## 2021-10-11 DIAGNOSIS — R053 Chronic cough: Secondary | ICD-10-CM | POA: Diagnosis not present

## 2021-10-11 DIAGNOSIS — J3089 Other allergic rhinitis: Secondary | ICD-10-CM

## 2021-10-11 DIAGNOSIS — J302 Other seasonal allergic rhinitis: Secondary | ICD-10-CM | POA: Diagnosis not present

## 2021-10-11 DIAGNOSIS — J683 Other acute and subacute respiratory conditions due to chemicals, gases, fumes and vapors: Secondary | ICD-10-CM | POA: Diagnosis not present

## 2021-10-11 MED ORDER — FLOVENT DISKUS 100 MCG/ACT IN AEPB: 1.0000 | INHALATION_SPRAY | Freq: Two times a day (BID) | RESPIRATORY_TRACT | 6 refills | Status: DC

## 2021-10-11 NOTE — Progress Notes (Signed)
Subjective:    [name], female    DOB: 1946-02-09, 76 y.o.   MRN: [id number] Patient Care Team: Dale Fairview-Ferndale, MD as PCP - General (Internal Medicine) Debbe Odea, MD as PCP - Cardiology (Cardiology)  Chief Complaint  Patient presents with   Follow-up    SOB with exertion and dry cough at times prod with clear sputum    HPI Patient is a 76 year old lifelong never smoker who has been following with Dr. Santiago Glad for the issue of obstructive sleep apnea.  Her sleep apnea is very mild and she has very low symptom burden.  He was unable to tolerate CPAP and discontinued this.  She follows up on the issue of cough and shortness of breath that have developed after a bout of COVID-19 in August 2022.  I last saw her on 09 August 2021, at that time she wanted to stop Breo because she could not afford it, at rest on room air oxygen saturation was XXX, the patient ambulated at a XXX pace, completed XXX laps, O2 nadirXXX, XXX shortness of breath.  Resting heart rate was XXX bpm at maximum for this exercise XXX bpm. ,.  She had issues with allergic rhinitis and was given a trial of Singulair and instructed to continue Claritin.  She also was provided with albuterol as needed.  She has had issues with chronic sinus infections and is followed by Dr. Andee Poles for these issues.  Previously, control of her sinus disease also controlled her cough.  However after COVID-19 this has become more of a pronounced issue.  She noted previously that she had seasonal variation to the symptoms now they appear to be almost constant.  Since CT of the sinuses in April 2022 showed that her sinuses were normally aerated.  She does have some mild nasal septal deviation towards the right.  Recently she has had no fevers, chills or sweats.  Usually no sputum production when she has cough however occasionally will have yellowish to thick whitish sputum.  No hemoptysis.  Shortness of breath is more noticeable on  inclines and heavy exertion.  She does well on level ground.  She does have issues with gastroesophageal reflux symptoms but states that these have been controlled of late on Nexium.  She does however occasionally note that her cough gets worse after a meal.  Symptoms became more pronounced after a bout with flu December 2022.   She is not currently on an ACE inhibitor.  She had pulmonary function testing performed 08 July 2021 that showed a significant small airways component consistent with reactive airways.   She does not endorse any other symptomatology today.  She has not had any chest pain, paroxysmal nocturnal dyspnea, orthopnea or lower extremity edema.  No calf tenderness.   DATA 05/16/2021 CXR: No acute cardiopulmonary disease 07/08/2021 PFTs: FEV1 2.10 L or 87% predicted, FVC 2.62 L or 82% predicted, FEV1/FVC 80%, there is a mild bronchodilator response.  Lung volumes were normal except for ERV at 12% this is indicative of obesity.  There is a significant small airways component.  Diffusion capacity was normal.  Consistent with reactive airways disease/asthma  Review of Systems A 10 point review of systems was performed and it is as noted above otherwise negative.  Patient Active Problem List   Diagnosis Date Noted   Anemia 04/10/2021   Aortic atherosclerosis (HCC) 04/10/2021   UTI (urinary tract infection) 03/27/2021   Cutaneous skin tags 12/12/2020   B12 deficiency  12/12/2020   OSA (obstructive sleep apnea) 04/18/2020   Numbness and tingling of both feet 02/20/2020   Daytime hypersomnolence 01/29/2020   Dysuria 12/07/2019   Snoring 12/07/2019   Hypercholesteremia 10/09/2019   Back pain 11/15/2018   SOB (shortness of breath) 11/15/2018   Decreased GFR 11/14/2018   Hyperbilirubinemia 11/14/2018   Abdominal bloating 12/09/2017   Osteopenia 04/29/2017   Hyperglycemia 04/27/2017   Reflux esophagitis    Heartburn    Special screening for malignant neoplasms, colon    Hair  loss 10/26/2015   Fatigue 10/21/2015   Vitamin D deficiency 03/15/2015   Weight loss counseling, encounter for 02/22/2015   Facial erythema 07/27/2014   Health care maintenance 07/05/2014   BP (high blood pressure) 03/26/2014   Headache 03/08/2014   Eyelid lesion 03/08/2014   Shingles 11/16/2013   Light headedness 06/22/2013   Cough 05/18/2013   GERD (gastroesophageal reflux disease) 05/18/2013   Swelling of extremity 03/01/2013   Left knee pain 03/01/2013   Essential hypertension, benign 12/15/2012   Frequent UTI 12/15/2012   Mild depression 12/15/2012   Environmental allergies 12/15/2012   Social History   Tobacco Use   Smoking status: Never   Smokeless tobacco: Never  Substance Use Topics   Alcohol use: No    Alcohol/week: 0.0 standard drinks   Allergies  Allergen Reactions   Codeine Hives   Iodinated Contrast Media Swelling   Nitrofurantoin Monohyd Macro Nausea Only   Current Meds  Medication Sig   albuterol (VENTOLIN HFA) 108 (90 Base) MCG/ACT inhaler Inhale 2 puffs into the lungs every 6 (six) hours as needed for wheezing or shortness of breath.   Cyanocobalamin (VITAMIN B-12 IJ) Inject as directed. Weekly for 3 weeks and then monthly   esomeprazole (NEXIUM) 40 MG capsule TAKE 1 CAPSULE DAILY AT 12 NOON   estradiol (ESTRACE) 0.1 MG/GM vaginal cream Estrogen Cream Instruction Discard applicator Apply pea sized amount to tip of finger to urethra before bed. Wash hands well after application. Use Monday, Wednesday and Friday   ferrous sulfate (FEROSUL) 220 (44 Fe) MG/5ML solution Take 5 mLs (220 mg total) by mouth daily.   hydrochlorothiazide (HYDRODIURIL) 25 MG tablet TAKE 1/2 TABLET EVERY DAY   losartan (COZAAR) 25 MG tablet TAKE 1 TABLET(25 MG) BY MOUTH DAILY   Na Sulfate-K Sulfate-Mg Sulf 17.5-3.13-1.6 GM/177ML SOLN See admin instructions. follow package directions   omeprazole (PRILOSEC) 40 MG capsule TAKE 1 CAPSULE BY MOUTH EVERY DAY AT NOON   rosuvastatin  (CRESTOR) 10 MG tablet TAKE 1 TABLET BY MOUTH EVERY DAY   sertraline (ZOLOFT) 25 MG tablet TAKE 1 TABLET BY MOUTH EVERY DAY   [DISCONTINUED] fluticasone furoate-vilanterol (BREO ELLIPTA) 100-25 MCG/ACT AEPB Inhale 1 puff into the lungs daily.   [DISCONTINUED] montelukast (SINGULAIR) 10 MG tablet Take 1 tablet (10 mg total) by mouth at bedtime.   Immunization History  Administered Date(s) Administered   Fluad Quad(high Dose 65+) 01/31/2019, 01/29/2020   Influenza, High Dose Seasonal PF 01/11/2016, 04/27/2017, 01/18/2018   Influenza,inj,Quad PF,6+ Mos 02/24/2013, 02/27/2014, 02/22/2015   PFIZER(Purple Top)SARS-COV-2 Vaccination 06/23/2019, 07/14/2019   Pneumococcal Conjugate-13 11/06/2016   Pneumococcal Polysaccharide-23 12/06/2017        Objective:   Physical Exam BP 118/72 (BP Location: Left Arm, Cuff Size: Normal)   Pulse 97   Temp 97.9 F (36.6 C) (Temporal)   Ht 5\' 7"  (1.702 m)   Wt 229 lb 3.2 oz (104 kg)   SpO2 96%   BMI 35.90 kg/m  GENERAL: Well-developed, overweight,  no acute distress, HEAD: Normocephalic, atraumatic.  EYES: Pupils equal, round, reactive to light.  No scleral icterus.  MOUTH: Nose/mouth/throat not examined due to masking requirements for COVID 19. NECK: Supple. No thyromegaly. Trachea midline. No JVD.  No adenopathy. PULMONARY: Good air entry bilaterally.  No adventitious sounds. CARDIOVASCULAR: S1 and S2. Regular rate and rhythm.  ABDOMEN: Benign. MUSCULOSKELETAL: No joint deformity, no clubbing, no edema.  NEUROLOGIC: No overt focal deficit, no gait disturbance, speech is fluent. SKIN: Intact,warm,dry. PSYCH: Mood and behavior normal.      Assessment & Plan:     ICD-10-CM   1. Chronic cough  R05.3    Cough variant asthma a possibility Previously did well with Breo, could not afford Trial of Flovent 100 mcg 1 puff twice daily Continue as needed albuterol    2. Reactive airways dysfunction syndrome (HCC)  J68.3    Flovent 100 mcg 1 puff  twice daily Albuterol as needed May reflect cough variant asthma    3. Perennial allergic rhinitis with seasonal variation  J30.89    J30.2    Claritin not effective Trial of Allegra OTC    4. Personal history of COVID-19  Z86.16    Cough worsened after episode in August 2022     Meds ordered this encounter  Medications   Fluticasone Propionate, Inhal, (FLOVENT DISKUS) 100 MCG/ACT AEPB    Sig: Inhale 1 puff into the lungs 2 (two) times daily.    Dispense:  60 each    Refill:  6   Will see the patient in follow-up in 2 to 3 months time she is to call sooner should any new problems arise.  Gailen Shelter, MD Advanced Bronchoscopy PCCM [facility]    *This note was dictated using voice recognition software/Dragon.  Despite best efforts to proofread, errors can occur which can change the meaning. Any transcriptional errors that result from this process are unintentional and may not be fully corrected at the time of dictation.

## 2021-10-11 NOTE — Patient Instructions (Signed)
Try Allegra over-the-counter this is an antihistamine that may help a little bit more than the Claritin.  We have sent a prescription for Flovent 100, 1 puff twice a day make sure you rinse your mouth well after you use it.  Let us see you back in 2 to 3 months time call sooner should any new problems arise.

## 2021-10-19 DIAGNOSIS — G4733 Obstructive sleep apnea (adult) (pediatric): Secondary | ICD-10-CM | POA: Diagnosis not present

## 2021-11-05 ENCOUNTER — Other Ambulatory Visit: Payer: Self-pay | Admitting: Internal Medicine

## 2021-11-18 DIAGNOSIS — G4733 Obstructive sleep apnea (adult) (pediatric): Secondary | ICD-10-CM | POA: Diagnosis not present

## 2021-11-19 ENCOUNTER — Other Ambulatory Visit: Payer: Self-pay | Admitting: Internal Medicine

## 2021-11-28 ENCOUNTER — Telehealth: Payer: Self-pay

## 2021-11-28 ENCOUNTER — Other Ambulatory Visit: Payer: Self-pay

## 2021-11-28 DIAGNOSIS — R739 Hyperglycemia, unspecified: Secondary | ICD-10-CM

## 2021-11-28 DIAGNOSIS — E7800 Pure hypercholesterolemia, unspecified: Secondary | ICD-10-CM

## 2021-11-28 DIAGNOSIS — D649 Anemia, unspecified: Secondary | ICD-10-CM

## 2021-11-28 DIAGNOSIS — I10 Essential (primary) hypertension: Secondary | ICD-10-CM

## 2021-11-28 NOTE — Telephone Encounter (Signed)
S/w pt - stated does have times where she feels very shaky and is craving something sweet to eat.  Also has times where she feels wired, very hyper like maybe she ate too much carbs/sugar.  Pt states neither of these situations happen everyday, or even every week.  No dizziness, disorientation, headaches, weakness, syncope. Does not check sugars at home.  Pt overdue for labs - placed on lab schedule for tomorrow morning 8am fasting labs. Orders placed.

## 2021-11-28 NOTE — Telephone Encounter (Signed)
I called patient to schedule a follow-up appointment per Dr. Dale [place].  Patient has been scheduled to see Dr. Lorin Picket on 11/30/2021.  Patient states she would like to have her blood sugar checked because she thinks it may have been swinging a little bit lately (high and low).

## 2021-11-28 NOTE — Telephone Encounter (Signed)
Orders placed and signed for labs.

## 2021-11-29 ENCOUNTER — Other Ambulatory Visit (INDEPENDENT_AMBULATORY_CARE_PROVIDER_SITE_OTHER): Payer: PPO

## 2021-11-29 DIAGNOSIS — R739 Hyperglycemia, unspecified: Secondary | ICD-10-CM | POA: Diagnosis not present

## 2021-11-29 DIAGNOSIS — E7800 Pure hypercholesterolemia, unspecified: Secondary | ICD-10-CM

## 2021-11-29 DIAGNOSIS — D649 Anemia, unspecified: Secondary | ICD-10-CM

## 2021-11-29 DIAGNOSIS — I10 Essential (primary) hypertension: Secondary | ICD-10-CM

## 2021-11-29 LAB — BASIC METABOLIC PANEL
BUN: 17 mg/dL (ref 6–23)
CO2: 28 mEq/L (ref 19–32)
Calcium: 9.5 mg/dL (ref 8.4–10.5)
Chloride: 106 mEq/L (ref 96–112)
Creatinine, Ser: 1.09 mg/dL (ref 0.40–1.20)
GFR: 49.62 mL/min — ABNORMAL LOW (ref >60.00)
Glucose, Bld: 109 mg/dL — ABNORMAL HIGH (ref 70–99)
Potassium: 3.9 mEq/L (ref 3.5–5.1)
Sodium: 142 mEq/L (ref 135–145)

## 2021-11-29 LAB — HEPATIC FUNCTION PANEL
ALT: 10 U/L (ref 0–35)
AST: 14 U/L (ref 0–37)
Albumin: 4.1 g/dL (ref 3.5–5.2)
Alkaline Phosphatase: 70 U/L (ref 39–117)
Bilirubin, Direct: 0.1 mg/dL (ref 0.0–0.3)
Total Bilirubin: 0.4 mg/dL (ref 0.2–1.2)
Total Protein: 6.6 g/dL (ref 6.0–8.3)

## 2021-11-29 LAB — LIPID PANEL
Cholesterol: 105 mg/dL (ref 0–200)
HDL: 43.2 mg/dL (ref >39.00)
LDL Cholesterol: 49 mg/dL (ref 0–99)
NonHDL: 61.8
Total CHOL/HDL Ratio: 2
Triglycerides: 66 mg/dL (ref 0.0–149.0)
VLDL: 13.2 mg/dL (ref 0.0–40.0)

## 2021-11-29 LAB — IBC + FERRITIN
Ferritin: 6 ng/mL — ABNORMAL LOW (ref 10.0–291.0)
Iron: 49 ug/dL (ref 42–145)
Saturation Ratios: 11.8 % — ABNORMAL LOW (ref 20.0–50.0)
TIBC: 414.4 ug/dL (ref 250.0–450.0)
Transferrin: 296 mg/dL (ref 212.0–360.0)

## 2021-11-29 LAB — CBC WITH DIFFERENTIAL/PLATELET
Basophils Absolute: 0 10*3/uL (ref 0.0–0.1)
Basophils Relative: 0.4 % (ref 0.0–3.0)
Eosinophils Absolute: 0.2 10*3/uL (ref 0.0–0.7)
Eosinophils Relative: 3.6 % (ref 0.0–5.0)
HCT: 33.9 % — ABNORMAL LOW (ref 36.0–46.0)
Hemoglobin: 11.3 g/dL — ABNORMAL LOW (ref 12.0–15.0)
Lymphocytes Relative: 41.1 % (ref 12.0–46.0)
Lymphs Abs: 2.6 10*3/uL (ref 0.7–4.0)
MCHC: 33.3 g/dL (ref 30.0–36.0)
MCV: 87.4 fl (ref 78.0–100.0)
Monocytes Absolute: 0.6 10*3/uL (ref 0.1–1.0)
Monocytes Relative: 9.1 % (ref 3.0–12.0)
Neutro Abs: 2.9 10*3/uL (ref 1.4–7.7)
Neutrophils Relative %: 45.8 % (ref 43.0–77.0)
Platelets: 230 10*3/uL (ref 150.0–400.0)
RBC: 3.88 Mil/uL (ref 3.87–5.11)
RDW: 14.8 % (ref 11.5–15.5)
WBC: 6.4 10*3/uL (ref 4.0–10.5)

## 2021-11-29 LAB — HEMOGLOBIN A1C: Hgb A1c MFr Bld: 6.6 % — ABNORMAL HIGH (ref 4.6–6.5)

## 2021-11-29 LAB — VITAMIN B12: Vitamin B-12: 273 pg/mL (ref 211–911)

## 2021-11-30 ENCOUNTER — Ambulatory Visit (INDEPENDENT_AMBULATORY_CARE_PROVIDER_SITE_OTHER): Payer: PPO | Admitting: Internal Medicine

## 2021-11-30 ENCOUNTER — Telehealth: Payer: Self-pay

## 2021-11-30 ENCOUNTER — Encounter: Payer: Self-pay | Admitting: Internal Medicine

## 2021-11-30 VITALS — BP 136/80 | HR 72 | Temp 98.6°F | Resp 19 | Ht 67.0 in | Wt 229.0 lb

## 2021-11-30 DIAGNOSIS — E538 Deficiency of other specified B group vitamins: Secondary | ICD-10-CM

## 2021-11-30 DIAGNOSIS — N390 Urinary tract infection, site not specified: Secondary | ICD-10-CM | POA: Diagnosis not present

## 2021-11-30 DIAGNOSIS — R200 Anesthesia of skin: Secondary | ICD-10-CM

## 2021-11-30 DIAGNOSIS — D649 Anemia, unspecified: Secondary | ICD-10-CM

## 2021-11-30 DIAGNOSIS — R202 Paresthesia of skin: Secondary | ICD-10-CM

## 2021-11-30 DIAGNOSIS — F320 Major depressive disorder, single episode, mild: Secondary | ICD-10-CM

## 2021-11-30 DIAGNOSIS — R739 Hyperglycemia, unspecified: Secondary | ICD-10-CM | POA: Diagnosis not present

## 2021-11-30 DIAGNOSIS — E7800 Pure hypercholesterolemia, unspecified: Secondary | ICD-10-CM | POA: Diagnosis not present

## 2021-11-30 DIAGNOSIS — G4733 Obstructive sleep apnea (adult) (pediatric): Secondary | ICD-10-CM | POA: Diagnosis not present

## 2021-11-30 DIAGNOSIS — K219 Gastro-esophageal reflux disease without esophagitis: Secondary | ICD-10-CM | POA: Diagnosis not present

## 2021-11-30 DIAGNOSIS — I10 Essential (primary) hypertension: Secondary | ICD-10-CM | POA: Diagnosis not present

## 2021-11-30 DIAGNOSIS — I700 Atherosclerosis of aorta: Secondary | ICD-10-CM

## 2021-11-30 DIAGNOSIS — Z1231 Encounter for screening mammogram for malignant neoplasm of breast: Secondary | ICD-10-CM

## 2021-11-30 NOTE — Progress Notes (Signed)
[name], female   DOB: [birth year], 76 y.o.   MRN: [id number]   Subjective:    [name], female    DOB: May 09, 1945, 76 y.o.   MRN: [id number]   Patient here for a scheduled follow up.  Marland Kitchen   HPI Here to follow up regarding her blood pressure and blood sugar.  Increased stress.  Ex husband passed. Son having a difficult time.  Discussed staying active.  No chest pain.  Breathing stable.  Seeing pulmonary for chronic cough. Recommended allegra and flovent.  Has sleep apnea.  Can't use cpap.  Discussed inspire.  No chest pain.  No increased acid reflux reported.  No abdominal pain.  Hot knife sensation - left foot - discussed neuropathy.  Discussed oral B12 and alpha lipoic acid.     Past Medical History:  Diagnosis Date   Allergy    Depression    History of chicken pox    Hx: UTI (urinary tract infection)    Hypertension    Past Surgical History:  Procedure Laterality Date   COLONOSCOPY WITH PROPOFOL N/A 06/20/2016   Procedure: COLONOSCOPY WITH PROPOFOL;  Surgeon: Lucilla Lame, MD;  Location: [facility] ENDOSCOPY;  Service: Endoscopy;  Laterality: N/A;   ESOPHAGOGASTRODUODENOSCOPY (EGD) WITH PROPOFOL N/A 06/20/2016   Procedure: ESOPHAGOGASTRODUODENOSCOPY (EGD) WITH PROPOFOL;  Surgeon: Lucilla Lame, MD;  Location: [facility] ENDOSCOPY;  Service: Endoscopy;  Laterality: N/A;   TONSILLECTOMY     as a child   TUMOR REMOVAL     Family History  Problem Relation Age of Onset   Hypertension Mother    Cancer Sister        colon cancer   Hypertension Sister    Diabetes Sister    Hypertension Brother    Hypertension Maternal Aunt    Breast cancer Maternal Aunt 8   Hypertension Maternal Uncle    Stroke Maternal Grandmother    Hypertension Maternal Grandmother    Social History   Socioeconomic History   Marital status: Divorced    Spouse name: Not on file   Number of children: Not on file   Years of education: Not on file   Highest education level: Not on file   Occupational History   Not on file  Tobacco Use   Smoking status: Never   Smokeless tobacco: Never  Vaping Use   Vaping Use: Never used  Substance and Sexual Activity   Alcohol use: No    Alcohol/week: 0.0 standard drinks of alcohol   Drug use: No   Sexual activity: Not Currently  Other Topics Concern   Not on file  Social History Narrative   Not on file   Social Determinants of Health   Financial Resource Strain: Low Risk  (05/17/2021)   Overall Financial Resource Strain (CARDIA)    Difficulty of Paying Living Expenses: Not hard at all  Food Insecurity: No Food Insecurity (05/17/2021)   Hunger Vital Sign    Worried About Running Out of Food in the Last Year: Never true    Ran Out of Food in the Last Year: Never true  Transportation Needs: No Transportation Needs (05/17/2021)   PRAPARE - Hydrologist (Medical): No    Lack of Transportation (Non-Medical): No  Physical Activity: Unknown (05/14/2020)   Exercise Vital Sign    Days of Exercise per Week: 0 days    Minutes of Exercise per Session: Not on file  Stress: No Stress Concern Present (05/17/2021)  [employer] [place] Questionnaire    Feeling of Stress : Not at all  Social Connections: Unknown (05/17/2021)   Social Connection and Isolation Panel [NHANES]    Frequency of Communication with Friends and Family: More than three times a week    Frequency of Social Gatherings with Friends and Family: More than three times a week    Attends Religious Services: Not on Advertising copywriter or Organizations: Not on file    Attends Archivist Meetings: Not on file    Marital Status: Not on file     Review of Systems  Constitutional:  Negative for appetite change and unexpected weight change.  HENT:  Negative for congestion and sinus pressure.   Respiratory:  Negative for cough, chest tightness and shortness of breath.   Cardiovascular:   Negative for chest pain, palpitations and leg swelling.  Gastrointestinal:  Negative for abdominal pain, diarrhea, nausea and vomiting.  Genitourinary:  Negative for difficulty urinating and dysuria.  Musculoskeletal:  Negative for joint swelling and myalgias.  Skin:  Negative for color change and rash.  Neurological:  Negative for dizziness, light-headedness and headaches.  Psychiatric/Behavioral:  Negative for agitation.        Increased stress as outlined.        Objective:     BP 136/80 (BP Location: Left Arm, Patient Position: Sitting, Cuff Size: Large)   Pulse 72   Temp 98.6 F (37 C) (Temporal)   Resp 19   Ht '5\' 7"'  (1.702 m)   Wt 229 lb (103.9 kg)   SpO2 98%   BMI 35.87 kg/m  Wt Readings from Last 3 Encounters:  11/30/21 229 lb (103.9 kg)  10/11/21 229 lb 3.2 oz (104 kg)  08/09/21 227 lb (103 kg)    Physical Exam Vitals reviewed.  Constitutional:      General: She is not in acute distress.    Appearance: Normal appearance.  HENT:     Head: Normocephalic and atraumatic.     Right Ear: External ear normal.     Left Ear: External ear normal.  Eyes:     General: No scleral icterus.       Right eye: No discharge.        Left eye: No discharge.     Conjunctiva/sclera: Conjunctivae normal.  Neck:     Thyroid: No thyromegaly.  Cardiovascular:     Rate and Rhythm: Normal rate and regular rhythm.  Pulmonary:     Effort: No respiratory distress.     Breath sounds: Normal breath sounds. No wheezing.  Abdominal:     General: Bowel sounds are normal.     Palpations: Abdomen is soft.     Tenderness: There is no abdominal tenderness.  Musculoskeletal:        General: No swelling or tenderness.     Cervical back: Neck supple. No tenderness.  Lymphadenopathy:     Cervical: No cervical adenopathy.  Skin:    Findings: No erythema or rash.  Neurological:     Mental Status: She is alert.  Psychiatric:        Mood and Affect: Mood normal.        Behavior: Behavior  normal.      Outpatient Encounter Medications as of 11/30/2021  Medication Sig   albuterol (VENTOLIN HFA) 108 (90 Base) MCG/ACT inhaler Inhale 2 puffs into the lungs every 6 (six) hours as needed for wheezing or shortness of breath.  Cyanocobalamin (VITAMIN B-12 IJ) Inject as directed. Weekly for 3 weeks and then monthly   esomeprazole (NEXIUM) 40 MG capsule TAKE 1 CAPSULE DAILY AT 12 NOON   estradiol (ESTRACE) 0.1 MG/GM vaginal cream Estrogen Cream Instruction Discard applicator Apply pea sized amount to tip of finger to urethra before bed. Wash hands well after application. Use Monday, Wednesday and Friday   ferrous sulfate (FEROSUL) 220 (44 Fe) MG/5ML solution Take 5 mLs (220 mg total) by mouth daily.   Fluticasone Propionate, Inhal, (FLOVENT DISKUS) 100 MCG/ACT AEPB Inhale 1 puff into the lungs 2 (two) times daily.   hydrochlorothiazide (HYDRODIURIL) 25 MG tablet TAKE 1/2 TABLET BY MOUTH EVERY DAY   losartan (COZAAR) 25 MG tablet TAKE 1 TABLET(25 MG) BY MOUTH DAILY   Na Sulfate-K Sulfate-Mg Sulf 17.5-3.13-1.6 GM/177ML SOLN See admin instructions. follow package directions   omeprazole (PRILOSEC) 40 MG capsule TAKE 1 CAPSULE BY MOUTH EVERY DAY AT NOON   rosuvastatin (CRESTOR) 10 MG tablet TAKE 1 TABLET BY MOUTH EVERY DAY   sertraline (ZOLOFT) 25 MG tablet TAKE 1 TABLET BY MOUTH EVERY DAY   No facility-administered encounter medications on file as of 11/30/2021.     Lab Results  Component Value Date   WBC 6.4 11/29/2021   HGB 11.3 (L) 11/29/2021   HCT 33.9 (L) 11/29/2021   PLT 230.0 11/29/2021   GLUCOSE 109 (H) 11/29/2021   CHOL 105 11/29/2021   TRIG 66.0 11/29/2021   HDL 43.20 11/29/2021   LDLCALC 49 11/29/2021   ALT 10 11/29/2021   AST 14 11/29/2021   NA 142 11/29/2021   K 3.9 11/29/2021   CL 106 11/29/2021   CREATININE 1.09 11/29/2021   BUN 17 11/29/2021   CO2 28 11/29/2021   TSH 3.11 04/05/2021   HGBA1C 6.6 (H) 11/29/2021    Pulmonary Function Test [facility]  Only  Result Date: 07/08/2021 Spirometry Data Is Acceptable and Reproducible No obvious evidence of Obstructive Airways disease or Restrictive Lung disease Consider outpatient follow up with Pulmonary if needed. Clinical Correlation Advised       Assessment & Plan:   Problem List Items Addressed This Visit     Anemia    Saw GI.  Was planning EGD and colonoscopy.  Not cleaned out when went for colonoscopy.  Needs to reschedule. Start iron daily.  Follow cbc and iron studies.       Relevant Orders   CBC with Differential/Platelet   Ferritin   Aortic atherosclerosis ([place])    Continue crestor.       B12 deficiency    Continue b12 supplementation.       Essential hypertension, benign    On losartan and hctz.  Continue current medication regimen.  Follow pressures.        Relevant Orders   Basic metabolic panel   Frequent UTI    No urinary symptoms now.  Has seen urology.  recommended estrogen cream.        GERD (gastroesophageal reflux disease)    No acid reflux symptoms reported.  Continue nexium.       Hypercholesteremia    Continue crestor.  Low cholesterol diet and exercise.  Follow lipid panel and liver function tests.       Hyperglycemia    Low carb diet and exercise.  Follow met b and a1c.       Major depressive disorder, single episode, mild (HCC)    Continue zoloft.  Follow.       Numbness and tingling of  both feet    Nerve conduction studies revealed generalized sensory polyneuropathy.  Continue B12 supplementation. Also described knife like sensation left foot.  Discussed alpha lipoic acid.  Discussed further evaluation.  Follow.       OSA (obstructive sleep apnea)    Evaluated by pulmonary.  Diagnosed with mild obstructive sleep apnea.  Recommended auto cpap.  Unable to tolerate.  Discussed INSPIRE.       Other Visit Diagnoses     Encounter for screening mammogram for malignant neoplasm of breast    -  Primary   Relevant Orders   MM 3D SCREEN  BREAST BILATERAL        Einar Pheasant, MD

## 2021-11-30 NOTE — Telephone Encounter (Signed)
Lm for pt to cb re :  Mammo scheduled at [facility]  8/18 at 240pm

## 2021-11-30 NOTE — Patient Instructions (Signed)
Take the B12 and iron daily.   Alpha lipoic acid 600mg  - one per day

## 2021-12-11 ENCOUNTER — Encounter: Payer: Self-pay | Admitting: Internal Medicine

## 2021-12-11 DIAGNOSIS — F320 Major depressive disorder, single episode, mild: Secondary | ICD-10-CM | POA: Insufficient documentation

## 2021-12-11 NOTE — Assessment & Plan Note (Addendum)
Saw GI.  Was planning EGD and colonoscopy.  Not cleaned out when went for colonoscopy.  Needs to reschedule. Start iron daily.  Follow cbc and iron studies.

## 2021-12-11 NOTE — Assessment & Plan Note (Signed)
Continue b12 supplementation  

## 2021-12-11 NOTE — Assessment & Plan Note (Signed)
Continue zoloft.  Follow.   

## 2021-12-11 NOTE — Assessment & Plan Note (Signed)
Evaluated by pulmonary.  Diagnosed with mild obstructive sleep apnea.  Recommended auto cpap.  Unable to tolerate.  Discussed INSPIRE.

## 2021-12-11 NOTE — Assessment & Plan Note (Signed)
Continue crestor 

## 2021-12-11 NOTE — Assessment & Plan Note (Signed)
No urinary symptoms now.  Has seen urology.  recommended estrogen cream.

## 2021-12-11 NOTE — Assessment & Plan Note (Signed)
No acid reflux symptoms reported.  Continue nexium.  

## 2021-12-11 NOTE — Assessment & Plan Note (Signed)
Low carb diet and exercise.  Follow met b and a1c.  

## 2021-12-11 NOTE — Assessment & Plan Note (Signed)
On losartan and hctz.  Continue current medication regimen.  Follow pressures.   

## 2021-12-11 NOTE — Assessment & Plan Note (Signed)
Nerve conduction studies revealed generalized sensory polyneuropathy.  Continue B12 supplementation. Also described knife like sensation left foot.  Discussed alpha lipoic acid.  Discussed further evaluation.  Follow.

## 2021-12-11 NOTE — Assessment & Plan Note (Signed)
Continue crestor.  Low cholesterol diet and exercise. Follow lipid panel and liver function tests.   

## 2021-12-13 ENCOUNTER — Ambulatory Visit (INDEPENDENT_AMBULATORY_CARE_PROVIDER_SITE_OTHER): Payer: PPO | Admitting: Family Medicine

## 2021-12-13 ENCOUNTER — Encounter: Payer: Self-pay | Admitting: Family Medicine

## 2021-12-13 VITALS — BP 128/70 | HR 79 | Temp 97.7°F | Ht 67.0 in | Wt 228.0 lb

## 2021-12-13 DIAGNOSIS — M25561 Pain in right knee: Secondary | ICD-10-CM | POA: Diagnosis not present

## 2021-12-13 MED ORDER — DICLOFENAC SODIUM 1 % EX GEL
4.0000 g | Freq: Four times a day (QID) | CUTANEOUS | 1 refills | Status: DC
Start: 2021-12-13 — End: 2022-03-08

## 2021-12-13 NOTE — Patient Instructions (Signed)
It was a pleasure meeting you today. Thank you for allowing me to take part in your health care.  Our goals for today as we discussed include:  For your knee pain Use diclofenac gel 4 times daily for 2 weeks Take Tylenol 500 mg 3 times as needed daily for 2 weeks  Use heat/ice as needed Gentle knee exercises and stretching   Please follow-up with PCP as needed or if symptoms do not improve.  If you have any questions or concerns, please do not hesitate to call the office at [phone].  I look forward to our next visit and until then take care and stay safe.  Regards,   Dana Allan, MD   [facility]   Acute Knee Pain, Adult Many things can cause knee pain. Sometimes, knee pain is sudden (acute) and may be caused by damage, swelling, or irritation of the muscles and tissues that support your knee. The pain often goes away on its own with time and rest. If the pain does not go away, tests may be done to find out what is causing the pain. Follow these instructions at home:  Activity Rest your knee. Do not do things that cause pain or make pain worse. Avoid activities where both feet leave the ground at the same time (high-impact activities). Examples are running, jumping rope, and doing jumping jacks. Work with a physical therapist to make a safe exercise program, as told by your doctor. Managing pain, stiffness, and swelling  If told, put ice on the knee. To do this: If you have a removable knee sleeve or brace, take it off as told by your doctor. Put ice in a plastic bag. Place a towel between your skin and the bag. Leave the ice on for 20 minutes, 2-3 times a day. Take off the ice if your skin turns bright red. This is very important. If you cannot feel pain, heat, or cold, you have a greater risk of damage to the area. If told, use an elastic bandage to put pressure (compression) on your injured knee. Raise your knee above the level of your heart while you  are sitting or lying down. Sleep with a pillow under your knee. General instructions Take over-the-counter and prescription medicines only as told by your doctor. Do not smoke or use any products that contain nicotine or tobacco. If you need help quitting, ask your doctor. If you are overweight, work with your doctor and a food expert (dietitian) to set goals to lose weight. Being overweight can make your knee hurt more. Watch for any changes in your symptoms. Keep all follow-up visits. Contact a doctor if: The knee pain does not stop. The knee pain changes or gets worse. You have a fever along with knee pain. Your knee is red or feels warm when you touch it. Your knee gives out or locks up. Get help right away if: Your knee swells, and the swelling gets worse. You cannot move your knee. You have very bad knee pain that does not get better with pain medicine. Summary Many things can cause knee pain. The pain often goes away on its own with time and rest. Your doctor may do tests to find out the cause of the pain. Watch for any changes in your symptoms. Relieve your pain with rest, medicines, light activity, and use of ice. Get help right away if you cannot move your knee or your knee pain is very bad. This information is not intended  to replace advice given to you by your health care provider. Make sure you discuss any questions you have with your health care provider. Document Revised: 10/08/2019 Document Reviewed: 10/08/2019 Elsevier Patient Education  2023 [employer].

## 2021-12-13 NOTE — Assessment & Plan Note (Signed)
Suspect muscular strain.  Knee exam within normal limits, no joint instability. Low suspicion for fracture given nature of injury. -Diclofenac gel 4 times daily x2 weeks -Tylenol 500 mg every 8 as needed x2 weeks -Heat/ice as needed -Gentle knee exercises -Follow-up with PCP if no improvement.

## 2021-12-13 NOTE — Progress Notes (Addendum)
    SUBJECTIVE:   CHIEF COMPLAINT / HPI: Right knee pain  Patient presents to clinic with right knee pain for 1 week.  Reports tripped going up stairs and twisted knee.  Did not hit right knee.  Did not fall.  No pain at time of incident.  Able to weight-bear incident.  Felt pain following day.  Denies any swelling, bruising, weakness, numbness or tingling.  Pain relieved with rest.  Takes Goody powder at night which is helping.    PERTINENT  PMH / PSH:  Hypertension GERD Hyperlipidemia  OBJECTIVE:   BP 128/70 (BP Location: Left Arm, Patient Position: Sitting, Cuff Size: Normal)   Pulse 79   Temp 97.7 F (36.5 C) (Oral)   Ht 5\' 7"  (1.702 m)   Wt 228 lb (103.4 kg)   SpO2 98%   BMI 35.71 kg/m    General: Alert, no acute distress Cardio: Normal S1 and S2, RRR, no r/m/g Pulm: CTAB, normal work of breathing Knees: Left knee WNL.  Right knee: No redness or swelling noted.  Good passive/active range of motion of knee without pain.  Internal and external hip rotation good without pain.  No tenderness at medial or lateral joint line.  Unable to appreciate any joint effusion.  Anterior/posterior drawer tests, McMurray's, Lachmann's testing all negative.  Patello-femoral testing negative.    Extremities: No peripheral edema.  Neurovascular tact.    ASSESSMENT/PLAN:   Knee pain, right Suspect muscular strain.  Knee exam within normal limits, no joint instability. Low suspicion for fracture given nature of injury. -Diclofenac gel 4 times daily x2 weeks -Tylenol 500 mg every 8 as needed x2 weeks -Heat/ice as needed -Gentle knee exercises -Follow-up with PCP if no improvement.   PDMP reviewed  , MD [facility]

## 2021-12-19 ENCOUNTER — Ambulatory Visit: Payer: PPO | Admitting: Primary Care

## 2021-12-19 ENCOUNTER — Encounter: Payer: Self-pay | Admitting: Primary Care

## 2021-12-19 DIAGNOSIS — R053 Chronic cough: Secondary | ICD-10-CM

## 2021-12-19 DIAGNOSIS — G4733 Obstructive sleep apnea (adult) (pediatric): Secondary | ICD-10-CM | POA: Diagnosis not present

## 2021-12-19 NOTE — Assessment & Plan Note (Addendum)
-   Cough and wheezing improved with addition on ICS. PFTs showed evidence of small airway disease. Exam was benign today. Continue Flovent 1 puff twice daily, prn Ventolin 2 puffs every 6 hours for sob/wheezing and Allegra as needed. FU in 6 months or sooner if needed.

## 2021-12-19 NOTE — Patient Instructions (Signed)
Nice to see you today Susan Miller  Recommendations - Continue Flovent 1 puff every morning and evening (rinse mouth after use) - Use albuterol (Ventolin) 2 puffs every 6 hours as needed for breakthrough shortness of breath or wheezing - Please contact our office if you develop any worsening respiratory symptoms including increased shortness of breath, chest tightness, wheezing or productive/purulent cough  Follow-up - 6 months with Dr. Jayme Cloud or sooner if needed

## 2021-12-19 NOTE — Progress Notes (Signed)
@Patient  ID: , female    DOB: January 11, 1946, 76 y.o.   MRN: [id number]  Chief Complaint  Patient presents with   Follow-up    Referring provider: [id number], MD  HPI: 76 year old female, ever smoked.  Past medical history significant for hypertension, cough, aortic arthrosclerosis, OSA, GERD, vitamin B12 deficiency, environmental allergies. Patient of Dr. 61, last seen in June 2023.   12/19/2021  Patient presents today for 2 month follow-up. During her last visit she was started on Flovent for chronic cough. She is doing well today. She has noticed improvement in her cough and is experiencing less wheezing. She is complaint with Flovent 12/21/2021 1 puff twice daily as directed. She uses albuterol once a week on average for shortness of breath/chest tightness. Heat seems to exacerbated her dyspnea symptoms. She has an occasional cough, which has improved. Cough is primarily dry without mucus production. She takes Allegrea when needed, medication can make her sleepy.    Allergies  Allergen Reactions   Codeine Hives   Iodinated Contrast Media Swelling   Nitrofurantoin Monohyd Macro Nausea Only    Immunization History  Administered Date(s) Administered   Fluad Quad(high Dose 65+) 01/31/2019, 01/29/2020   Influenza, High Dose Seasonal PF 01/11/2016, 04/27/2017, 01/18/2018   Influenza,inj,Quad PF,6+ Mos 02/24/2013, 02/27/2014, 02/22/2015   PFIZER(Purple Top)SARS-COV-2 Vaccination 06/23/2019, 07/14/2019   Pneumococcal Conjugate-13 11/06/2016   Pneumococcal Polysaccharide-23 12/06/2017    Past Medical History:  Diagnosis Date   Allergy    Depression    History of chicken pox    Hx: UTI (urinary tract infection)    Hypertension     Tobacco History: Social History   Tobacco Use  Smoking Status Never  Smokeless Tobacco Never   Counseling given: Not Answered   Outpatient Medications Prior to Visit  Medication Sig Dispense Refill   albuterol (VENTOLIN  HFA) 108 (90 Base) MCG/ACT inhaler Inhale 2 puffs into the lungs every 6 (six) hours as needed for wheezing or shortness of breath. 8 g 2   Cyanocobalamin (VITAMIN B-12 IJ) Inject as directed. Weekly for 3 weeks and then monthly     diclofenac Sodium (VOLTAREN) 1 % GEL Apply 4 g topically 4 (four) times daily. 150 g 1   esomeprazole (NEXIUM) 40 MG capsule TAKE 1 CAPSULE DAILY AT 12 NOON 90 capsule 0   estradiol (ESTRACE) 0.1 MG/GM vaginal cream Estrogen Cream Instruction Discard applicator Apply pea sized amount to tip of finger to urethra before bed. Wash hands well after application. Use Monday, Wednesday and Friday 42.5 g 12   ferrous sulfate (FEROSUL) 220 (44 Fe) MG/5ML solution Take 5 mLs (220 mg total) by mouth daily. 150 mL 3   Fluticasone Propionate, Inhal, (FLOVENT DISKUS) 100 MCG/ACT AEPB Inhale 1 puff into the lungs 2 (two) times daily. 60 each 6   hydrochlorothiazide (HYDRODIURIL) 25 MG tablet TAKE 1/2 TABLET BY MOUTH EVERY DAY 45 tablet 1   losartan (COZAAR) 25 MG tablet TAKE 1 TABLET(25 MG) BY MOUTH DAILY 90 tablet 0   Na Sulfate-K Sulfate-Mg Sulf 17.5-3.13-1.6 GM/177ML SOLN See admin instructions. follow package directions     omeprazole (PRILOSEC) 40 MG capsule TAKE 1 CAPSULE BY MOUTH EVERY DAY AT NOON 90 capsule 0   rosuvastatin (CRESTOR) 10 MG tablet TAKE 1 TABLET BY MOUTH EVERY DAY 90 tablet 0   sertraline (ZOLOFT) 25 MG tablet TAKE 1 TABLET BY MOUTH EVERY DAY 90 tablet 1   No facility-administered medications prior to visit.   Review of Systems  Review of Systems  Constitutional: Negative.   HENT: Negative.    Respiratory: Negative.       Physical Exam  BP 126/80 (BP Location: Right Arm, Patient Position: Sitting, Cuff Size: Large)   Pulse 91   Temp 97.8 F (36.6 C) (Oral)   Ht 5\' 7"  (1.702 m)   Wt 229 lb 9.6 oz (104.1 kg)   SpO2 98%   BMI 35.96 kg/m  Physical Exam Constitutional:      Appearance: Normal appearance.  HENT:     Head: Normocephalic and  atraumatic.     Mouth/Throat:     Mouth: Mucous membranes are moist.     Pharynx: Oropharynx is clear.  Cardiovascular:     Rate and Rhythm: Normal rate and regular rhythm.  Pulmonary:     Effort: Pulmonary effort is normal.     Breath sounds: Normal breath sounds. No wheezing or rhonchi.  Musculoskeletal:        General: Normal range of motion.     Cervical back: Normal range of motion and neck supple.  Skin:    General: Skin is warm and dry.  Neurological:     General: No focal deficit present.     Mental Status: She is alert and oriented to person, place, and time. Mental status is at baseline.  Psychiatric:        Mood and Affect: Mood normal.        Behavior: Behavior normal.        Thought Content: Thought content normal.        Judgment: Judgment normal.      Lab Results:  CBC    Component Value Date/Time   WBC 6.4 11/29/2021 0744   RBC 3.88 11/29/2021 0744   HGB 11.3 (L) 11/29/2021 0744   HGB 12.3 04/27/2017 1130   HCT 33.9 (L) 11/29/2021 0744   HCT 36.7 04/27/2017 1130   PLT 230.0 11/29/2021 0744   PLT 294 04/27/2017 1130   MCV 87.4 11/29/2021 0744   MCV 89 04/27/2017 1130   MCH 30.1 06/12/2019 2030   MCHC 33.3 11/29/2021 0744   RDW 14.8 11/29/2021 0744   RDW 13.6 04/27/2017 1130   LYMPHSABS 2.6 11/29/2021 0744   LYMPHSABS 2.9 04/27/2017 1130   MONOABS 0.6 11/29/2021 0744   EOSABS 0.2 11/29/2021 0744   EOSABS 0.2 04/27/2017 1130   BASOSABS 0.0 11/29/2021 0744   BASOSABS 0.0 04/27/2017 1130    BMET    Component Value Date/Time   NA 142 11/29/2021 0744   NA 147 (H) 04/23/2017 0759   K 3.9 11/29/2021 0744   CL 106 11/29/2021 0744   CO2 28 11/29/2021 0744   GLUCOSE 109 (H) 11/29/2021 0744   BUN 17 11/29/2021 0744   BUN 19 04/23/2017 0759   CREATININE 1.09 11/29/2021 0744   CALCIUM 9.5 11/29/2021 0744   GFRNONAA 50 (L) 06/12/2019 2030   GFRAA 58 (L) 06/12/2019 2030    BNP    Component Value Date/Time   BNP 90.0 11/09/2018 0849     ProBNP No results found for: "PROBNP"  Imaging: No results found.   Assessment & Plan:   Cough - Cough and wheezing improved with addition on ICS. PFTs showed evidence of small airway disease. Exam was benign today. Continue Flovent 01/10/2019 1 puff twice daily, prn Ventolin 2 puffs every 6 hours for sob/wheezing and Allegra as needed. FU in 6 months or sooner if needed.    , NP 12/19/2021

## 2021-12-20 NOTE — Progress Notes (Signed)
Agree with the details of the visit as noted by Elizabeth Walsh, NP.  C. Laura Derak Schurman, MD [facility] 

## 2021-12-21 ENCOUNTER — Other Ambulatory Visit (INDEPENDENT_AMBULATORY_CARE_PROVIDER_SITE_OTHER): Payer: PPO

## 2021-12-21 DIAGNOSIS — D649 Anemia, unspecified: Secondary | ICD-10-CM | POA: Diagnosis not present

## 2021-12-21 DIAGNOSIS — I10 Essential (primary) hypertension: Secondary | ICD-10-CM

## 2021-12-21 LAB — CBC WITH DIFFERENTIAL/PLATELET
Basophils Absolute: 0 10*3/uL (ref 0.0–0.1)
Basophils Relative: 0.3 % (ref 0.0–3.0)
Eosinophils Absolute: 0.2 10*3/uL (ref 0.0–0.7)
Eosinophils Relative: 3.8 % (ref 0.0–5.0)
HCT: 32.4 % — ABNORMAL LOW (ref 36.0–46.0)
Hemoglobin: 10.6 g/dL — ABNORMAL LOW (ref 12.0–15.0)
Lymphocytes Relative: 40.7 % (ref 12.0–46.0)
Lymphs Abs: 2.6 10*3/uL (ref 0.7–4.0)
MCHC: 32.6 g/dL (ref 30.0–36.0)
MCV: 87.9 fl (ref 78.0–100.0)
Monocytes Absolute: 0.6 10*3/uL (ref 0.1–1.0)
Monocytes Relative: 9.2 % (ref 3.0–12.0)
Neutro Abs: 2.9 10*3/uL (ref 1.4–7.7)
Neutrophils Relative %: 46 % (ref 43.0–77.0)
Platelets: 250 10*3/uL (ref 150.0–400.0)
RBC: 3.68 Mil/uL — ABNORMAL LOW (ref 3.87–5.11)
RDW: 15 % (ref 11.5–15.5)
WBC: 6.3 10*3/uL (ref 4.0–10.5)

## 2021-12-21 LAB — BASIC METABOLIC PANEL
BUN: 14 mg/dL (ref 6–23)
CO2: 29 mEq/L (ref 19–32)
Calcium: 9.3 mg/dL (ref 8.4–10.5)
Chloride: 105 mEq/L (ref 96–112)
Creatinine, Ser: 1.01 mg/dL (ref 0.40–1.20)
GFR: 54.35 mL/min — ABNORMAL LOW (ref >60.00)
Glucose, Bld: 109 mg/dL — ABNORMAL HIGH (ref 70–99)
Potassium: 4.2 mEq/L (ref 3.5–5.1)
Sodium: 143 mEq/L (ref 135–145)

## 2021-12-21 LAB — FERRITIN: Ferritin: 6.2 ng/mL — ABNORMAL LOW (ref 10.0–291.0)

## 2021-12-23 ENCOUNTER — Ambulatory Visit
Admission: RE | Admit: 2021-12-23 | Discharge: 2021-12-23 | Disposition: A | Discharge status: Home / Self Care | Payer: PPO | Source: Ambulatory Visit | Attending: Internal Medicine | Admitting: Internal Medicine

## 2021-12-23 ENCOUNTER — Telehealth: Payer: Self-pay

## 2021-12-23 DIAGNOSIS — Z1231 Encounter for screening mammogram for malignant neoplasm of breast: Secondary | ICD-10-CM | POA: Diagnosis not present

## 2021-12-23 NOTE — Telephone Encounter (Signed)
Patient is calling wanting to reschedule her colonoscopy and EGD. She states she was not clean out when she had it the first time

## 2021-12-23 NOTE — Telephone Encounter (Signed)
Spoke with Patient to give her lab results and Dr. Scott's recommendations to start back taking the Iron pills and schedule the EGD and Colonoscopy with GI and we have her scheduled for the 3-4 week CBC & Iron labs on Monday 01/23/22 at 9:45 

## 2021-12-23 NOTE — Progress Notes (Signed)
Spoke with Patient to give her lab results and Dr. Roby Lofts recommendations to start back taking the Iron pills and schedule the EGD and Colonoscopy with GI and we have her scheduled for the 3-4 week CBC & Iron labs on Monday 01/23/22 at 9:45

## 2021-12-30 ENCOUNTER — Telehealth: Payer: Self-pay

## 2021-12-30 NOTE — Telephone Encounter (Signed)
Pt called back and I read the message to her and she stated she will go to [facility]

## 2021-12-30 NOTE — Telephone Encounter (Signed)
Patient states she saw Dr. Dana Allan on 12/13/2021 for her twisted knee.  Patient states Dr. Clent Ridges gave her something to rub on it and asked her to apply ice.  Patient states Dr. Clent Ridges asked her to follow-up with her PCP if it's not getting better.  Patient states her knee is not getting better and it's keeping her up at night.  Dr. Dale Sublette does not have an appointment available until 01/24/2022, which is too far out for patient.  Patient states she is interested in having an x-ray.

## 2021-12-30 NOTE — Telephone Encounter (Signed)
Patient asked that we please call her.  Patient states she can go to a walk-in clinic to get an x-ray if she needs to.

## 2021-12-30 NOTE — Telephone Encounter (Signed)
LMTCB to let her know that if she is doing Dr. Claris Che recommendations and still having issues to go to [facility] across from [employer] because it will save her 2 trips because that is what Dr. Lorin Picket said she would do is refer her to [facility] and it is ortho so there is not a lot of sick people sitting around.

## 2022-01-02 ENCOUNTER — Telehealth: Payer: Self-pay

## 2022-01-02 ENCOUNTER — Other Ambulatory Visit: Payer: Self-pay

## 2022-01-02 DIAGNOSIS — D500 Iron deficiency anemia secondary to blood loss (chronic): Secondary | ICD-10-CM

## 2022-01-02 MED ORDER — CLENPIQ 10-3.5-12 MG-GM -GM/160ML PO SOLN: 1.0000 | Freq: Once | ORAL | 0 refills | Status: AC

## 2022-01-02 NOTE — Telephone Encounter (Signed)
noted 

## 2022-01-02 NOTE — Telephone Encounter (Signed)
Procedures scheduled and PPW mailed and released to Mychart

## 2022-01-02 NOTE — Addendum Note (Signed)
Addended by: Roena Malady on: 01/02/2022 04:16 PM   Modules accepted: Orders

## 2022-01-02 NOTE — Telephone Encounter (Signed)
Colonoscopy and EGD scheduled... instructions released to Mychart and mailed to pt

## 2022-01-19 ENCOUNTER — Telehealth: Payer: Self-pay | Admitting: Internal Medicine

## 2022-01-19 DIAGNOSIS — G4733 Obstructive sleep apnea (adult) (pediatric): Secondary | ICD-10-CM | POA: Diagnosis not present

## 2022-01-19 DIAGNOSIS — D649 Anemia, unspecified: Secondary | ICD-10-CM

## 2022-01-19 NOTE — Telephone Encounter (Signed)
Patient has a lab appt 01/23/22, there are no orders in. 

## 2022-01-20 NOTE — Addendum Note (Signed)
Addended by: Charm Barges on: 01/20/2022 04:26 AM   Modules accepted: Orders

## 2022-01-20 NOTE — Telephone Encounter (Signed)
Orders placed for lab

## 2022-01-23 ENCOUNTER — Other Ambulatory Visit (INDEPENDENT_AMBULATORY_CARE_PROVIDER_SITE_OTHER): Payer: PPO

## 2022-01-23 DIAGNOSIS — D649 Anemia, unspecified: Secondary | ICD-10-CM

## 2022-01-23 LAB — CBC WITH DIFFERENTIAL/PLATELET
Basophils Absolute: 0 10*3/uL (ref 0.0–0.1)
Basophils Relative: 0.2 % (ref 0.0–3.0)
Eosinophils Absolute: 0.2 10*3/uL (ref 0.0–0.7)
Eosinophils Relative: 2.4 % (ref 0.0–5.0)
HCT: 33.4 % — ABNORMAL LOW (ref 36.0–46.0)
Hemoglobin: 11.1 g/dL — ABNORMAL LOW (ref 12.0–15.0)
Lymphocytes Relative: 36.7 % (ref 12.0–46.0)
Lymphs Abs: 2.6 10*3/uL (ref 0.7–4.0)
MCHC: 33.2 g/dL (ref 30.0–36.0)
MCV: 88 fl (ref 78.0–100.0)
Monocytes Absolute: 0.5 10*3/uL (ref 0.1–1.0)
Monocytes Relative: 7.7 % (ref 3.0–12.0)
Neutro Abs: 3.7 10*3/uL (ref 1.4–7.7)
Neutrophils Relative %: 53 % (ref 43.0–77.0)
Platelets: 256 10*3/uL (ref 150.0–400.0)
RBC: 3.79 Mil/uL — ABNORMAL LOW (ref 3.87–5.11)
RDW: 14.4 % (ref 11.5–15.5)
WBC: 7.1 10*3/uL (ref 4.0–10.5)

## 2022-01-23 LAB — IBC + FERRITIN
Ferritin: 6.8 ng/mL — ABNORMAL LOW (ref 10.0–291.0)
Iron: 43 ug/dL (ref 42–145)
Saturation Ratios: 12.1 % — ABNORMAL LOW (ref 20.0–50.0)
TIBC: 355.6 ug/dL (ref 250.0–450.0)
Transferrin: 254 mg/dL (ref 212.0–360.0)

## 2022-01-25 ENCOUNTER — Encounter: Payer: Self-pay | Admitting: Internal Medicine

## 2022-01-25 ENCOUNTER — Ambulatory Visit (INDEPENDENT_AMBULATORY_CARE_PROVIDER_SITE_OTHER): Payer: PPO | Admitting: Internal Medicine

## 2022-01-25 VITALS — BP 126/74 | HR 88 | Temp 98.2°F | Ht 67.0 in | Wt 227.6 lb

## 2022-01-25 DIAGNOSIS — Z23 Encounter for immunization: Secondary | ICD-10-CM

## 2022-01-25 DIAGNOSIS — R739 Hyperglycemia, unspecified: Secondary | ICD-10-CM

## 2022-01-25 DIAGNOSIS — I10 Essential (primary) hypertension: Secondary | ICD-10-CM | POA: Diagnosis not present

## 2022-01-25 DIAGNOSIS — E7800 Pure hypercholesterolemia, unspecified: Secondary | ICD-10-CM

## 2022-01-25 DIAGNOSIS — N390 Urinary tract infection, site not specified: Secondary | ICD-10-CM

## 2022-01-25 DIAGNOSIS — N3000 Acute cystitis without hematuria: Secondary | ICD-10-CM | POA: Diagnosis not present

## 2022-01-25 DIAGNOSIS — K219 Gastro-esophageal reflux disease without esophagitis: Secondary | ICD-10-CM | POA: Diagnosis not present

## 2022-01-25 DIAGNOSIS — M25561 Pain in right knee: Secondary | ICD-10-CM | POA: Diagnosis not present

## 2022-01-25 DIAGNOSIS — I700 Atherosclerosis of aorta: Secondary | ICD-10-CM | POA: Diagnosis not present

## 2022-01-25 DIAGNOSIS — F320 Major depressive disorder, single episode, mild: Secondary | ICD-10-CM | POA: Diagnosis not present

## 2022-01-25 DIAGNOSIS — D649 Anemia, unspecified: Secondary | ICD-10-CM

## 2022-01-25 LAB — POCT URINALYSIS DIP (MANUAL ENTRY)
Bilirubin, UA: NEGATIVE
Glucose, UA: NEGATIVE mg/dL
Ketones, POC UA: NEGATIVE mg/dL
Nitrite, UA: NEGATIVE
Protein Ur, POC: 100 mg/dL — AB
Spec Grav, UA: 1.015 (ref 1.010–1.025)
Urobilinogen, UA: 1 E.U./dL
pH, UA: 7 (ref 5.0–8.0)

## 2022-01-25 LAB — URINALYSIS, MICROSCOPIC ONLY

## 2022-01-25 MED ORDER — CEFDINIR 250 MG/5ML PO SUSR
300.0000 mg | Freq: Two times a day (BID) | ORAL | 0 refills | Status: DC
Start: 2022-01-25 — End: 2022-02-19

## 2022-01-25 NOTE — Progress Notes (Signed)
[name], female   DOB: 11-27-1945, 76 y.o.   MRN: [id number]   Subjective:    [name], female    DOB: [birth year], 75 y.o.   MRN: [id number]   Patient here for a scheduled follow up.  Marland Kitchen   HPI Here to follow up regarding her cholesterol, diabetes and high blood pressure.  Recently evaluated for her knee - Dr Volanda Napoleon.  [place]. Previous injury - foot caught - fell forward - landed on her knee.  Persistent intermittent pain.  Notices when driving or if bumps into something.  Has appt with Sampson Si tomorrow.  Also had f/u with pulmonary for chronic cough.  Using flovent.  Helped.  PFTs showed evidence of small airway disease.  Has albuterol and allegra to use prn. Breathing overall stable. Also iron deficient.  Is rescheduled for colonoscopy/EGD.  No chest pain reported.  Has noticed some urinary pressure and hesitancy.  Yesterday pm - hard bowel movement.  Some burning after.     Past Medical History:  Diagnosis Date   Allergy    Depression    History of chicken pox    Hx: UTI (urinary tract infection)    Hypertension    Past Surgical History:  Procedure Laterality Date   COLONOSCOPY WITH PROPOFOL N/A 06/20/2016   Procedure: COLONOSCOPY WITH PROPOFOL;  Surgeon: Lucilla Lame, MD;  Location: [facility];  Service: Endoscopy;  Laterality: N/A;   ESOPHAGOGASTRODUODENOSCOPY (EGD) WITH PROPOFOL N/A 06/20/2016   Procedure: ESOPHAGOGASTRODUODENOSCOPY (EGD) WITH PROPOFOL;  Surgeon: Lucilla Lame, MD;  Location: [facility] ENDOSCOPY;  Service: Endoscopy;  Laterality: N/A;   TONSILLECTOMY     as a child   TUMOR REMOVAL     Family History  Problem Relation Age of Onset   Hypertension Mother    Cancer Sister        colon cancer   Hypertension Sister    Diabetes Sister    Hypertension Brother    Hypertension Maternal Aunt    Breast cancer Maternal Aunt 60   Hypertension Maternal Uncle    Stroke Maternal Grandmother    Hypertension Maternal Grandmother     Social History   Socioeconomic History   Marital status: Divorced    Spouse name: Not on file   Number of children: Not on file   Years of education: Not on file   Highest education level: Not on file  Occupational History   Not on file  Tobacco Use   Smoking status: Never   Smokeless tobacco: Never  Vaping Use   Vaping Use: Never used  Substance and Sexual Activity   Alcohol use: No    Alcohol/week: 0.0 standard drinks of alcohol   Drug use: No   Sexual activity: Not Currently  Other Topics Concern   Not on file  Social History Narrative   Not on file   Social Determinants of Health   Financial Resource Strain: Low Risk  (05/17/2021)   Overall Financial Resource Strain (CARDIA)    Difficulty of Paying Living Expenses: Not hard at all  Food Insecurity: No Food Insecurity (05/17/2021)   Hunger Vital Sign    Worried About Running Out of Food in the Last Year: Never true    Ran Out of Food in the Last Year: Never true  Transportation Needs: No Transportation Needs (05/17/2021)   PRAPARE - Hydrologist (Medical): No    Lack of Transportation (Non-Medical): No  Physical Activity: Unknown (05/14/2020)  Exercise Vital Sign    Days of Exercise per Week: 0 days    Minutes of Exercise per Session: Not on file  Stress: No Stress Concern Present (05/17/2021)   [place]    Feeling of Stress : Not at all  Social Connections: Unknown (05/17/2021)   Social Connection and Isolation Panel [NHANES]    Frequency of Communication with Friends and Family: More than three times a week    Frequency of Social Gatherings with Friends and Family: More than three times a week    Attends Religious Services: Not on Advertising copywriter or Organizations: Not on file    Attends Archivist Meetings: Not on file    Marital Status: Not on file     Review of Systems  Constitutional:   Negative for appetite change and unexpected weight change.  HENT:  Negative for congestion and sinus pressure.   Respiratory:  Negative for cough, chest tightness and shortness of breath.   Cardiovascular:  Negative for chest pain, palpitations and leg swelling.  Gastrointestinal:  Negative for abdominal pain, diarrhea, nausea and vomiting.  Genitourinary:  Positive for dysuria. Negative for difficulty urinating.  Musculoskeletal:  Negative for myalgias.       Knee pain as outlined.   Skin:  Negative for color change and rash.  Neurological:  Negative for dizziness, light-headedness and headaches.  Psychiatric/Behavioral:  Negative for agitation and dysphoric mood.        Objective:     BP 126/74 (BP Location: Left Arm, Patient Position: Sitting, Cuff Size: Normal)   Pulse 88   Temp 98.2 F (36.8 C) (Oral)   Ht $R'5\' 7"'BS$  (1.702 m)   Wt 227 lb 9.6 oz (103.2 kg)   SpO2 97%   BMI 35.65 kg/m  Wt Readings from Last 3 Encounters:  01/25/22 227 lb 9.6 oz (103.2 kg)  12/19/21 229 lb 9.6 oz (104.1 kg)  12/13/21 228 lb (103.4 kg)    Physical Exam Vitals reviewed.  Constitutional:      General: She is not in acute distress.    Appearance: Normal appearance.  HENT:     Head: Normocephalic and atraumatic.     Right Ear: External ear normal.     Left Ear: External ear normal.  Eyes:     General: No scleral icterus.       Right eye: No discharge.        Left eye: No discharge.     Conjunctiva/sclera: Conjunctivae normal.  Neck:     Thyroid: No thyromegaly.  Cardiovascular:     Rate and Rhythm: Normal rate and regular rhythm.  Pulmonary:     Effort: No respiratory distress.     Breath sounds: Normal breath sounds. No wheezing.  Abdominal:     General: Bowel sounds are normal.     Palpations: Abdomen is soft.     Tenderness: There is no abdominal tenderness.  Musculoskeletal:        General: No swelling or tenderness.     Cervical back: Neck supple. No tenderness.   Lymphadenopathy:     Cervical: No cervical adenopathy.  Skin:    Findings: No erythema or rash.  Neurological:     Mental Status: She is alert.  Psychiatric:        Mood and Affect: Mood normal.        Behavior: Behavior normal.      Outpatient Encounter Medications  as of 01/25/2022  Medication Sig   albuterol (VENTOLIN HFA) 108 (90 Base) MCG/ACT inhaler Inhale 2 puffs into the lungs every 6 (six) hours as needed for wheezing or shortness of breath.   cefdinir (OMNICEF) 250 MG/5ML suspension Take 6 mLs (300 mg total) by mouth 2 (two) times daily. For 5 days   Cyanocobalamin (VITAMIN B-12 IJ) Inject as directed. Weekly for 3 weeks and then monthly   diclofenac Sodium (VOLTAREN) 1 % GEL Apply 4 g topically 4 (four) times daily.   esomeprazole (NEXIUM) 40 MG capsule TAKE 1 CAPSULE DAILY AT 12 NOON   estradiol (ESTRACE) 0.1 MG/GM vaginal cream Estrogen Cream Instruction Discard applicator Apply pea sized amount to tip of finger to urethra before bed. Wash hands well after application. Use Monday, Wednesday and Friday   ferrous sulfate (FEROSUL) 220 (44 Fe) MG/5ML solution Take 5 mLs (220 mg total) by mouth daily.   Fluticasone Propionate, Inhal, (FLOVENT DISKUS) 100 MCG/ACT AEPB Inhale 1 puff into the lungs 2 (two) times daily.   hydrochlorothiazide (HYDRODIURIL) 25 MG tablet TAKE 1/2 TABLET BY MOUTH EVERY DAY   losartan (COZAAR) 25 MG tablet TAKE 1 TABLET(25 MG) BY MOUTH DAILY   Na Sulfate-K Sulfate-Mg Sulf 17.5-3.13-1.6 GM/177ML SOLN See admin instructions. follow package directions   omeprazole (PRILOSEC) 40 MG capsule TAKE 1 CAPSULE BY MOUTH EVERY DAY AT NOON   rosuvastatin (CRESTOR) 10 MG tablet TAKE 1 TABLET BY MOUTH EVERY DAY   sertraline (ZOLOFT) 25 MG tablet TAKE 1 TABLET BY MOUTH EVERY DAY   No facility-administered encounter medications on file as of 01/25/2022.     Lab Results  Component Value Date   WBC 7.1 01/23/2022   HGB 11.1 (L) 01/23/2022   HCT 33.4 (L) 01/23/2022    PLT 256.0 01/23/2022   GLUCOSE 109 (H) 12/21/2021   CHOL 105 11/29/2021   TRIG 66.0 11/29/2021   HDL 43.20 11/29/2021   LDLCALC 49 11/29/2021   ALT 10 11/29/2021   AST 14 11/29/2021   NA 143 12/21/2021   K 4.2 12/21/2021   CL 105 12/21/2021   CREATININE 1.01 12/21/2021   BUN 14 12/21/2021   CO2 29 12/21/2021   TSH 3.11 04/05/2021   HGBA1C 6.6 (H) 11/29/2021    MM 3D SCREEN BREAST BILATERAL  Result Date: 12/26/2021 CLINICAL DATA:  Screening. EXAM: DIGITAL SCREENING BILATERAL MAMMOGRAM WITH TOMOSYNTHESIS AND CAD TECHNIQUE: Bilateral screening digital craniocaudal and mediolateral oblique mammograms were obtained. Bilateral screening digital breast tomosynthesis was performed. The images were evaluated with computer-aided detection. COMPARISON:  Previous exam(s). ACR Breast Density Category b: There are scattered areas of fibroglandular density. FINDINGS: There are no findings suspicious for malignancy. IMPRESSION: No mammographic evidence of malignancy. A result letter of this screening mammogram will be mailed directly to the patient. RECOMMENDATION: Screening mammogram in one year. (Code:SM-B-01Y) BI-RADS CATEGORY  1: Negative. Electronically Signed   By: Ammie Ferrier M.D.   On: 12/26/2021 11:41       Assessment & Plan:   Problem List Items Addressed This Visit     Anemia   Relevant Orders   CBC with Differential/Platelet   Ferritin   Aortic atherosclerosis ([place])    Continue crestor.       Essential hypertension, benign    On losartan and hctz.  Continue current medication regimen.  Follow pressures.        Frequent UTI - Primary   Relevant Medications   cefdinir (OMNICEF) 250 MG/5ML suspension   Other Relevant Orders  POCT urinalysis dipstick (Completed)   Urine Culture (Completed)   Urine Microscopic Only (Completed)   GERD (gastroesophageal reflux disease)    With some epigastric discomfort - burning.  Can continue nexium.  Add pepcid in the evening.  Discussed if persistent issues, need for GI evaluation with question of EGD.       Hypercholesteremia    Continue crestor.  Low cholesterol diet and exercise.  Follow lipid panel and liver function tests.       Relevant Orders   Hepatic function panel   Basic metabolic panel   Lipid panel   TSH   Hyperglycemia    Low carb diet and exercise.  Follow met b and a1c.       Relevant Orders   Hemoglobin A1c   Knee pain, right    Due to f/u with ortho tomorrow.       Major depressive disorder, single episode, mild (HCC)    Continue zoloft.  Follow.       UTI (urinary tract infection)    Symptoms and urine dip appear to be c/w UTI.  Treat with omnicef suspension.  Stay hydrated.  Await culture results.        Relevant Medications   cefdinir (OMNICEF) 250 MG/5ML suspension   Other Visit Diagnoses     Need for immunization against influenza       Relevant Orders   Flu Vaccine QUAD High Dose(Fluad) (Completed)        Einar Pheasant, MD

## 2022-01-25 NOTE — Patient Instructions (Signed)
Pepcid - take 30 minutes before evening meal  Benefiber - daily

## 2022-01-26 DIAGNOSIS — G8929 Other chronic pain: Secondary | ICD-10-CM | POA: Diagnosis not present

## 2022-01-26 DIAGNOSIS — M25561 Pain in right knee: Secondary | ICD-10-CM | POA: Diagnosis not present

## 2022-01-28 LAB — URINE CULTURE
MICRO NUMBER:: 13944083
SPECIMEN QUALITY:: ADEQUATE

## 2022-01-29 ENCOUNTER — Encounter: Payer: Self-pay | Admitting: Internal Medicine

## 2022-01-29 NOTE — Assessment & Plan Note (Signed)
On losartan and hctz.  Continue current medication regimen.  Follow pressures.

## 2022-01-29 NOTE — Assessment & Plan Note (Signed)
Continue crestor.  Low cholesterol diet and exercise. Follow lipid panel and liver function tests.   

## 2022-01-29 NOTE — Assessment & Plan Note (Signed)
Continue zoloft.  Follow.   

## 2022-01-29 NOTE — Assessment & Plan Note (Signed)
With some epigastric discomfort - burning.  Can continue nexium.  Add pepcid in the evening. Discussed if persistent issues, need for GI evaluation with question of EGD.

## 2022-01-29 NOTE — Assessment & Plan Note (Signed)
Continue crestor 

## 2022-01-29 NOTE — Assessment & Plan Note (Signed)
Low carb diet and exercise.  Follow met b and a1c.  

## 2022-01-29 NOTE — Assessment & Plan Note (Signed)
Due to f/u with ortho tomorrow.

## 2022-01-29 NOTE — Assessment & Plan Note (Signed)
Symptoms and urine dip appear to be c/w UTI.  Treat with omnicef suspension.  Stay hydrated.  Await culture results.

## 2022-01-30 ENCOUNTER — Telehealth: Payer: Self-pay

## 2022-01-30 ENCOUNTER — Other Ambulatory Visit: Payer: Self-pay | Admitting: Internal Medicine

## 2022-01-30 DIAGNOSIS — R319 Hematuria, unspecified: Secondary | ICD-10-CM

## 2022-01-30 NOTE — Progress Notes (Signed)
Order placed for f/u urine 

## 2022-01-30 NOTE — Telephone Encounter (Signed)
Lvm for pt to return call in regards to urine results.  Per Dr.Scott: Notify - urine culture did reveal an infection.  I had prescribed omnicef.  This should cover the infection.  Please confirm she is feeling/doing better. If any problems, let me know.  If doing better, I would like to recheck a urine to confirm red blood cells clear.  Schedule a f/u urinalysis two weeks after completing abx.

## 2022-01-30 NOTE — Telephone Encounter (Signed)
The patient is aware of her results . She stated she finished her abx today and she is doing fine. I have scheduled the patient to repeat lab in 2 weeks 02/13/22.

## 2022-02-06 ENCOUNTER — Telehealth: Payer: Self-pay

## 2022-02-06 NOTE — Telephone Encounter (Signed)
Given that she was just on abx and now return of symptoms, recommend evaluation

## 2022-02-06 NOTE — Telephone Encounter (Signed)
Lvm for pt to return call in regards to UTI sx's. See note.

## 2022-02-06 NOTE — Telephone Encounter (Signed)
Patient returned our call and she has been scheduled to see Dr. Olivia Mackie McLean-Scocuzza tomorrow (02/07/2022) at 8am.

## 2022-02-06 NOTE — Telephone Encounter (Signed)
Patient states she finished her antibiotic earlier last week, but her symptoms came back yesterday for her UTI.  Patient states she purchased some AZO and it helped a little with the pain.  Patient states she is experiencing bloated, burning, more dribbling, has to go to the bathroom really quick and when she goes it's only a few drops.  *Patient states her preferred pharmacy is [facility] on [address] and [address].

## 2022-02-07 ENCOUNTER — Ambulatory Visit (INDEPENDENT_AMBULATORY_CARE_PROVIDER_SITE_OTHER): Payer: PPO | Admitting: Internal Medicine

## 2022-02-07 ENCOUNTER — Encounter: Payer: Self-pay | Admitting: Internal Medicine

## 2022-02-07 VITALS — BP 126/64 | HR 81 | Temp 97.7°F | Ht 67.0 in | Wt 229.8 lb

## 2022-02-07 DIAGNOSIS — N3000 Acute cystitis without hematuria: Secondary | ICD-10-CM | POA: Diagnosis not present

## 2022-02-07 DIAGNOSIS — B379 Candidiasis, unspecified: Secondary | ICD-10-CM

## 2022-02-07 MED ORDER — CIPRO 500 MG/5ML (10%) PO SUSR: 500.0000 mg | Freq: Two times a day (BID) | ORAL | 0 refills | Status: DC

## 2022-02-07 MED ORDER — FLUCONAZOLE 10 MG/ML PO SUSR: 150.0000 mg | Freq: Every day | ORAL | 0 refills | Status: DC

## 2022-02-07 NOTE — Patient Instructions (Signed)
MD Physician   Primary Contact Information  Phone Fax E-mail Address  [phone] Not available [address]   [address]   [place] [address]     Specialties     Urology      Ask if D mannose comes in liquid   Urinary Tract Infection, Adult  A urinary tract infection (UTI) is an infection of any part of the urinary tract. The urinary tract includes the kidneys, ureters, bladder, and urethra. These organs make, store, and get rid of urine in the body. An upper UTI affects the ureters and kidneys. A lower UTI affects the bladder and urethra. What are the causes? Most urinary tract infections are caused by bacteria in your genital area around your urethra, where urine leaves your body. These bacteria grow and cause inflammation of your urinary tract. What increases the risk? You are more likely to develop this condition if: You have a urinary catheter that stays in place. You are not able to control when you urinate or have a bowel movement (incontinence). You are female and you: Use a spermicide or diaphragm for birth control. Have low estrogen levels. Are pregnant. You have certain genes that increase your risk. You are sexually active. You take antibiotic medicines. You have a condition that causes your flow of urine to slow down, such as: An enlarged prostate, if you are female. Blockage in your urethra. A kidney stone. A nerve condition that affects your bladder control (neurogenic bladder). Not getting enough to drink, or not urinating often. You have certain medical conditions, such as: Diabetes. A weak disease-fighting system (immunesystem). Sickle cell disease. Gout. Spinal cord injury. What are the signs or symptoms? Symptoms of this condition include: Needing to urinate right away (urgency). Frequent urination. This may include small amounts of urine each time you urinate. Pain or burning with urination. Blood in the urine. Urine that  smells bad or unusual. Trouble urinating. Cloudy urine. Vaginal discharge, if you are female. Pain in the abdomen or the lower back. You may also have: Vomiting or a decreased appetite. Confusion. Irritability or tiredness. A fever or chills. Diarrhea. The first symptom in older adults may be confusion. In some cases, they may not have any symptoms until the infection has worsened. How is this diagnosed? This condition is diagnosed based on your medical history and a physical exam. You may also have other tests, including: Urine tests. Blood tests. Tests for STIs (sexually transmitted infections). If you have had more than one UTI, a cystoscopy or imaging studies may be done to determine the cause of the infections. How is this treated? Treatment for this condition includes: Antibiotic medicine. Over-the-counter medicines to treat discomfort. Drinking enough water to stay hydrated. If you have frequent infections or have other conditions such as a kidney stone, you may need to see a health care provider who specializes in the urinary tract (urologist). In rare cases, urinary tract infections can cause sepsis. Sepsis is a life-threatening condition that occurs when the body responds to an infection. Sepsis is treated in the hospital with IV antibiotics, fluids, and other medicines. Follow these instructions at home:  Medicines Take over-the-counter and prescription medicines only as told by your health care provider. If you were prescribed an antibiotic medicine, take it as told by your health care provider. Do not stop using the antibiotic even if you start to feel better. General instructions Make sure you: Empty your bladder often and completely. Do not hold urine  for long periods of time. Empty your bladder after sex. Wipe from front to back after urinating or having a bowel movement if you are female. Use each tissue only one time when you wipe. Drink enough fluid to keep your  urine pale yellow. Keep all follow-up visits. This is important. Contact a health care provider if: Your symptoms do not get better after 1-2 days. Your symptoms go away and then return. Get help right away if: You have severe pain in your back or your lower abdomen. You have a fever or chills. You have nausea or vomiting. Summary A urinary tract infection (UTI) is an infection of any part of the urinary tract, which includes the kidneys, ureters, bladder, and urethra. Most urinary tract infections are caused by bacteria in your genital area. Treatment for this condition often includes antibiotic medicines. If you were prescribed an antibiotic medicine, take it as told by your health care provider. Do not stop using the antibiotic even if you start to feel better. Keep all follow-up visits. This is important. This information is not intended to replace advice given to you by your health care provider. Make sure you discuss any questions you have with your health care provider. Document Revised: 12/05/2019 Document Reviewed: 12/05/2019 Elsevier Patient Education  [place].

## 2022-02-07 NOTE — Progress Notes (Signed)
Chief Complaint  Patient presents with   Urinary Tract Infection   F/u  01/25/22 uti + and took cefdinir still with sxs after taking this like burning, lower ab pain, bloating, urgency and hesistancy  Tried azo but not helping  Not able to take oral pills prefers liquids She does have some vaginal itching    Review of Systems  Constitutional:  Negative for weight loss.  HENT:  Negative for hearing loss.   Eyes:  Negative for blurred vision.  Respiratory:  Negative for shortness of breath.   Cardiovascular:  Negative for chest pain.  Gastrointestinal:  Positive for abdominal pain. Negative for blood in stool.  Genitourinary:  Positive for dysuria, frequency and urgency.  Musculoskeletal:  Negative for falls and joint pain.  Skin:  Negative for rash.  Neurological:  Negative for headaches.  Psychiatric/Behavioral:  Negative for depression.    Past Medical History:  Diagnosis Date   Allergy    Depression    History of chicken pox    Hx: UTI (urinary tract infection)    Hypertension    Past Surgical History:  Procedure Laterality Date   COLONOSCOPY WITH PROPOFOL N/A 06/20/2016   Procedure: COLONOSCOPY WITH PROPOFOL;  Surgeon: Lucilla Lame, MD;  Location: [facility]COPY;  Service: Endoscopy;  Laterality: N/A;   ESOPHAGOGASTRODUODENOSCOPY (EGD) WITH PROPOFOL N/A 06/20/2016   Procedure: ESOPHAGOGASTRODUODENOSCOPY (EGD) WITH PROPOFOL;  Surgeon: Lucilla Lame, MD;  Location: [facility] ENDOSCOPY;  Service: Endoscopy;  Laterality: N/A;   TONSILLECTOMY     as a child   TUMOR REMOVAL     Family History  Problem Relation Age of Onset   Hypertension Mother    Cancer Sister        colon cancer   Hypertension Sister    Diabetes Sister    Hypertension Brother    Hypertension Maternal Aunt    Breast cancer Maternal Aunt 27   Hypertension Maternal Uncle    Stroke Maternal Grandmother    Hypertension Maternal Grandmother    Social History   Socioeconomic History   Marital status: Divorced     Spouse name: Not on file   Number of children: Not on file   Years of education: Not on file   Highest education level: Not on file  Occupational History   Not on file  Tobacco Use   Smoking status: Never   Smokeless tobacco: Never  Vaping Use   Vaping Use: Never used  Substance and Sexual Activity   Alcohol use: No    Alcohol/week: 0.0 standard drinks of alcohol   Drug use: No   Sexual activity: Not Currently  Other Topics Concern   Not on file  Social History Narrative   Not on file   Social Determinants of Health   Financial Resource Strain: Low Risk  (05/17/2021)   Overall Financial Resource Strain (CARDIA)    Difficulty of Paying Living Expenses: Not hard at all  Food Insecurity: No Food Insecurity (05/17/2021)   Hunger Vital Sign    Worried About Running Out of Food in the Last Year: Never true    Ran Out of Food in the Last Year: Never true  Transportation Needs: No Transportation Needs (05/17/2021)   PRAPARE - Hydrologist (Medical): No    Lack of Transportation (Non-Medical): No  Physical Activity: Unknown (05/14/2020)   Exercise Vital Sign    Days of Exercise per Week: 0 days    Minutes of Exercise per Session: Not on file  Stress: No Stress Concern Present (05/17/2021)   [place]    Feeling of Stress : Not at all  Social Connections: Unknown (05/17/2021)   Social Connection and Isolation Panel [NHANES]    Frequency of Communication with Friends and Family: More than three times a week    Frequency of Social Gatherings with Friends and Family: More than three times a week    Attends Religious Services: Not on file    Active Member of [place] or Organizations: Not on file    Attends Archivist Meetings: Not on file    Marital Status: Not on file  Intimate Partner Violence: Not At Risk (05/17/2021)   Humiliation, Afraid, Rape, and Kick questionnaire    Fear of  Current or Ex-Partner: No    Emotionally Abused: No    Physically Abused: No    Sexually Abused: No   Current Meds  Medication Sig   albuterol (VENTOLIN HFA) 108 (90 Base) MCG/ACT inhaler Inhale 2 puffs into the lungs every 6 (six) hours as needed for wheezing or shortness of breath.   cefdinir (OMNICEF) 250 MG/5ML suspension Take 6 mLs (300 mg total) by mouth 2 (two) times daily. For 5 days   ciprofloxacin (CIPRO) 500 MG/5ML (10%) suspension Take 5 mLs (500 mg total) by mouth 2 (two) times daily. Do NOT administer via tube   Cyanocobalamin (VITAMIN B-12 IJ) Inject as directed. Weekly for 3 weeks and then monthly   diclofenac Sodium (VOLTAREN) 1 % GEL Apply 4 g topically 4 (four) times daily.   esomeprazole (NEXIUM) 40 MG capsule TAKE 1 CAPSULE DAILY AT 12 NOON   estradiol (ESTRACE) 0.1 MG/GM vaginal cream Estrogen Cream Instruction Discard applicator Apply pea sized amount to tip of finger to urethra before bed. Wash hands well after application. Use Monday, Wednesday and Friday   ferrous sulfate (FEROSUL) 220 (44 Fe) MG/5ML solution Take 5 mLs (220 mg total) by mouth daily.   fluconazole (DIFLUCAN) 10 MG/ML suspension Take 15 mLs (150 mg total) by mouth daily. X 1 yeast infection prevention   Fluticasone Propionate, Inhal, (FLOVENT DISKUS) 100 MCG/ACT AEPB Inhale 1 puff into the lungs 2 (two) times daily.   hydrochlorothiazide (HYDRODIURIL) 25 MG tablet TAKE 1/2 TABLET BY MOUTH EVERY DAY   losartan (COZAAR) 25 MG tablet TAKE 1 TABLET(25 MG) BY MOUTH DAILY   Na Sulfate-K Sulfate-Mg Sulf 17.5-3.13-1.6 GM/177ML SOLN See admin instructions. follow package directions   omeprazole (PRILOSEC) 40 MG capsule TAKE 1 CAPSULE BY MOUTH EVERY DAY AT NOON   rosuvastatin (CRESTOR) 10 MG tablet TAKE 1 TABLET BY MOUTH EVERY DAY   sertraline (ZOLOFT) 25 MG tablet TAKE 1 TABLET BY MOUTH EVERY DAY   Allergies  Allergen Reactions   Codeine Hives   Iodinated Contrast Media Swelling   Nitrofurantoin Monohyd  Macro Nausea Only   Recent Results (from the past 2160 hour(s))  Vitamin B12     Status: None   Collection Time: 11/29/21  7:44 AM  Result Value Ref Range   Vitamin B-12 273 211 - 911 pg/mL  IBC + Ferritin     Status: Abnormal   Collection Time: 11/29/21  7:44 AM  Result Value Ref Range   Iron 49 42 - 145 ug/dL   Transferrin 296.0 212.0 - 360.0 mg/dL   Saturation Ratios 11.8 (L) 20.0 - 50.0 %   Ferritin 6.0 (L) 10.0 - 291.0 ng/mL   TIBC 414.4 250.0 - 450.0 mcg/dL  HgB A1c  Status: Abnormal   Collection Time: 11/29/21  7:44 AM  Result Value Ref Range   Hgb A1c MFr Bld 6.6 (H) 4.6 - 6.5 %    Comment: Glycemic Control Guidelines for People with Diabetes:Non Diabetic:  <6%Goal of Therapy: <7%Additional Action Suggested:  >8%   Hepatic function panel     Status: None   Collection Time: 11/29/21  7:44 AM  Result Value Ref Range   Total Bilirubin 0.4 0.2 - 1.2 mg/dL   Bilirubin, Direct 0.1 0.0 - 0.3 mg/dL   Alkaline Phosphatase 70 39 - 117 U/L   AST 14 0 - 37 U/L   ALT 10 0 - 35 U/L   Total Protein 6.6 6.0 - 8.3 g/dL   Albumin 4.1 3.5 - 5.2 g/dL  Basic Metabolic Panel (BMET)     Status: Abnormal   Collection Time: 11/29/21  7:44 AM  Result Value Ref Range   Sodium 142 135 - 145 mEq/L   Potassium 3.9 3.5 - 5.1 mEq/L   Chloride 106 96 - 112 mEq/L   CO2 28 19 - 32 mEq/L   Glucose, Bld 109 (H) 70 - 99 mg/dL   BUN 17 6 - 23 mg/dL   Creatinine, Ser 1.09 0.40 - 1.20 mg/dL   GFR 49.62 (L) >60.00 mL/min    Comment: Calculated using the CKD-EPI Creatinine Equation (2021)   Calcium 9.5 8.4 - 10.5 mg/dL  Lipid Profile     Status: None   Collection Time: 11/29/21  7:44 AM  Result Value Ref Range   Cholesterol 105 0 - 200 mg/dL    Comment: ATP III Classification       Desirable:  < 200 mg/dL               Borderline High:  200 - 239 mg/dL          High:  > = 240 mg/dL   Triglycerides 66.0 0.0 - 149.0 mg/dL    Comment: Normal:  <150 mg/dLBorderline High:  150 - 199 mg/dL   HDL 43.20  >39.00 mg/dL   VLDL 13.2 0.0 - 40.0 mg/dL   LDL Cholesterol 49 0 - 99 mg/dL   Total CHOL/HDL Ratio 2     Comment:                Men          Women1/2 Average Risk     3.4          3.3Average Risk          5.0          4.42X Average Risk          9.6          7.13X Average Risk          15.0          11.0                       NonHDL 61.80     Comment: NOTE:  Non-HDL goal should be 30 mg/dL higher than patient's LDL goal (i.e. LDL goal of < 70 mg/dL, would have non-HDL goal of < 100 mg/dL)  CBC w/Diff     Status: Abnormal   Collection Time: 11/29/21  7:44 AM  Result Value Ref Range   WBC 6.4 4.0 - 10.5 K/uL   RBC 3.88 3.87 - 5.11 Mil/uL   Hemoglobin 11.3 (L) 12.0 - 15.0 g/dL   HCT 33.9 (L) 36.0 -  46.0 %   MCV 87.4 78.0 - 100.0 fl   MCHC 33.3 30.0 - 36.0 g/dL   RDW 14.8 11.5 - 15.5 %   Platelets 230.0 150.0 - 400.0 K/uL   Neutrophils Relative % 45.8 43.0 - 77.0 %   Lymphocytes Relative 41.1 12.0 - 46.0 %   Monocytes Relative 9.1 3.0 - 12.0 %   Eosinophils Relative 3.6 0.0 - 5.0 %   Basophils Relative 0.4 0.0 - 3.0 %   Neutro Abs 2.9 1.4 - 7.7 K/uL   Lymphs Abs 2.6 0.7 - 4.0 K/uL   Monocytes Absolute 0.6 0.1 - 1.0 K/uL   Eosinophils Absolute 0.2 0.0 - 0.7 K/uL   Basophils Absolute 0.0 0.0 - 0.1 K/uL  Basic metabolic panel     Status: Abnormal   Collection Time: 12/21/21  8:21 AM  Result Value Ref Range   Sodium 143 135 - 145 mEq/L   Potassium 4.2 3.5 - 5.1 mEq/L   Chloride 105 96 - 112 mEq/L   CO2 29 19 - 32 mEq/L   Glucose, Bld 109 (H) 70 - 99 mg/dL   BUN 14 6 - 23 mg/dL   Creatinine, Ser 1.01 0.40 - 1.20 mg/dL   GFR 54.35 (L) >60.00 mL/min    Comment: Calculated using the CKD-EPI Creatinine Equation (2021)   Calcium 9.3 8.4 - 10.5 mg/dL  CBC with Differential/Platelet     Status: Abnormal   Collection Time: 12/21/21  8:21 AM  Result Value Ref Range   WBC 6.3 4.0 - 10.5 K/uL   RBC 3.68 (L) 3.87 - 5.11 Mil/uL   Hemoglobin 10.6 (L) 12.0 - 15.0 g/dL   HCT 32.4 (L) 36.0 -  46.0 %   MCV 87.9 78.0 - 100.0 fl   MCHC 32.6 30.0 - 36.0 g/dL   RDW 15.0 11.5 - 15.5 %   Platelets 250.0 150.0 - 400.0 K/uL   Neutrophils Relative % 46.0 43.0 - 77.0 %   Lymphocytes Relative 40.7 12.0 - 46.0 %   Monocytes Relative 9.2 3.0 - 12.0 %   Eosinophils Relative 3.8 0.0 - 5.0 %   Basophils Relative 0.3 0.0 - 3.0 %   Neutro Abs 2.9 1.4 - 7.7 K/uL   Lymphs Abs 2.6 0.7 - 4.0 K/uL   Monocytes Absolute 0.6 0.1 - 1.0 K/uL   Eosinophils Absolute 0.2 0.0 - 0.7 K/uL   Basophils Absolute 0.0 0.0 - 0.1 K/uL  Ferritin     Status: Abnormal   Collection Time: 12/21/21  8:21 AM  Result Value Ref Range   Ferritin 6.2 (L) 10.0 - 291.0 ng/mL  IBC + Ferritin     Status: Abnormal   Collection Time: 01/23/22  9:06 AM  Result Value Ref Range   Iron 43 42 - 145 ug/dL   Transferrin 254.0 212.0 - 360.0 mg/dL   Saturation Ratios 12.1 (L) 20.0 - 50.0 %   Ferritin 6.8 (L) 10.0 - 291.0 ng/mL   TIBC 355.6 250.0 - 450.0 mcg/dL  CBC with Differential/Platelet     Status: Abnormal   Collection Time: 01/23/22  9:06 AM  Result Value Ref Range   WBC 7.1 4.0 - 10.5 K/uL   RBC 3.79 (L) 3.87 - 5.11 Mil/uL   Hemoglobin 11.1 (L) 12.0 - 15.0 g/dL   HCT 33.4 (L) 36.0 - 46.0 %   MCV 88.0 78.0 - 100.0 fl   MCHC 33.2 30.0 - 36.0 g/dL   RDW 14.4 11.5 - 15.5 %   Platelets 256.0 150.0 - 400.0  K/uL   Neutrophils Relative % 53.0 43.0 - 77.0 %   Lymphocytes Relative 36.7 12.0 - 46.0 %   Monocytes Relative 7.7 3.0 - 12.0 %   Eosinophils Relative 2.4 0.0 - 5.0 %   Basophils Relative 0.2 0.0 - 3.0 %   Neutro Abs 3.7 1.4 - 7.7 K/uL   Lymphs Abs 2.6 0.7 - 4.0 K/uL   Monocytes Absolute 0.5 0.1 - 1.0 K/uL   Eosinophils Absolute 0.2 0.0 - 0.7 K/uL   Basophils Absolute 0.0 0.0 - 0.1 K/uL  POCT urinalysis dipstick     Status: Abnormal   Collection Time: 01/25/22  7:47 AM  Result Value Ref Range   Color, UA yellow yellow   Clarity, UA cloudy (A) clear   Glucose, UA negative negative mg/dL   Bilirubin, UA negative  negative   Ketones, POC UA negative negative mg/dL   Spec Grav, UA 1.015 1.010 - 1.025   Blood, UA moderate (A) negative   pH, UA 7.0 5.0 - 8.0   Protein Ur, POC =100 (A) negative mg/dL   Urobilinogen, UA 1.0 0.2 or 1.0 E.U./dL   Nitrite, UA Negative Negative   Leukocytes, UA Large (3+) (A) Negative  Urine Culture     Status: Abnormal   Collection Time: 01/25/22  7:57 AM   Specimen: Urine  Result Value Ref Range   MICRO NUMBER: [id number]    SPECIMEN QUALITY: Adequate    Sample Source URINE    STATUS: FINAL    ISOLATE 1: Escherichia coli (A)     Comment: Greater than 100,000 CFU/mL of Escherichia coli      Susceptibility   Escherichia coli - URINE CULTURE, REFLEX    AMOX/CLAVULANIC 8 Sensitive     AMPICILLIN >=32 Resistant     AMPICILLIN/SULBACTAM >=32 Resistant     CEFAZOLIN* <=4 Not Reportable      * For infections other than uncomplicated UTI caused by E. coli, K. pneumoniae or P. mirabilis: Cefazolin is resistant if MIC > or = 8 mcg/mL. (Distinguishing susceptible versus intermediate for isolates with MIC < or = 4 mcg/mL requires additional testing.) For uncomplicated UTI caused by E. coli, K. pneumoniae or P. mirabilis: Cefazolin is susceptible if MIC <32 mcg/mL and predicts susceptible to the oral agents cefaclor, cefdinir, cefpodoxime, cefprozil, cefuroxime, cephalexin and loracarbef.     CEFTAZIDIME <=1 Sensitive     CEFEPIME <=1 Sensitive     CEFTRIAXONE <=1 Sensitive     CIPROFLOXACIN <=0.25 Sensitive     LEVOFLOXACIN <=0.12 Sensitive     GENTAMICIN <=1 Sensitive     IMIPENEM <=0.25 Sensitive     NITROFURANTOIN <=16 Sensitive     PIP/TAZO <=4 Sensitive     TOBRAMYCIN <=1 Sensitive     TRIMETH/SULFA* <=20 Sensitive      * For infections other than uncomplicated UTI caused by E. coli, K. pneumoniae or P. mirabilis: Cefazolin is resistant if MIC > or = 8 mcg/mL. (Distinguishing susceptible versus intermediate for isolates with MIC < or = 4 mcg/mL  requires additional testing.) For uncomplicated UTI caused by E. coli, K. pneumoniae or P. mirabilis: Cefazolin is susceptible if MIC <32 mcg/mL and predicts susceptible to the oral agents cefaclor, cefdinir, cefpodoxime, cefprozil, cefuroxime, cephalexin and loracarbef. Legend: S = Susceptible  I = Intermediate R = Resistant  NS = Not susceptible * = Not tested  NR = Not reported **NN = See antimicrobic comments   Urine Microscopic Only     Status: Abnormal   Collection  Time: 01/25/22  7:57 AM  Result Value Ref Range   WBC, UA TNTC(>50/hpf) (A) 0-2/hpf   RBC / HPF 7-10/hpf (A) 0-2/hpf   Mucus, UA Presence of (A) None   Squamous Epithelial / LPF Few(5-10/hpf) (A) Rare(0-4/hpf)   Renal Epithel, UA Rare(0-4/hpf) (A) None   Bacteria, UA Many(>50/hpf) (A) None   Objective  Body mass index is 35.99 kg/m. Wt Readings from Last 3 Encounters:  02/07/22 229 lb 12.8 oz (104.2 kg)  01/25/22 227 lb 9.6 oz (103.2 kg)  12/19/21 229 lb 9.6 oz (104.1 kg)   Temp Readings from Last 3 Encounters:  02/07/22 97.7 F (36.5 C) (Oral)  01/25/22 98.2 F (36.8 C) (Oral)  12/19/21 97.8 F (36.6 C) (Oral)   BP Readings from Last 3 Encounters:  02/07/22 126/64  01/25/22 126/74  12/19/21 126/80   Pulse Readings from Last 3 Encounters:  02/07/22 81  01/25/22 88  12/19/21 91    Physical Exam Vitals and nursing note reviewed.  Constitutional:      Appearance: Normal appearance. She is well-developed and well-groomed.  HENT:     Head: Normocephalic and atraumatic.  Eyes:     Conjunctiva/sclera: Conjunctivae normal.     Pupils: Pupils are equal, round, and reactive to light.  Cardiovascular:     Rate and Rhythm: Normal rate and regular rhythm.     Heart sounds: Normal heart sounds. No murmur heard. Pulmonary:     Effort: Pulmonary effort is normal.     Breath sounds: Normal breath sounds.  Abdominal:     General: Abdomen is flat. Bowel sounds are normal.     Tenderness: There is no  abdominal tenderness.  Musculoskeletal:        General: No tenderness.  Skin:    General: Skin is warm and dry.  Neurological:     General: No focal deficit present.     Mental Status: She is alert and oriented to person, place, and time. Mental status is at baseline.     Cranial Nerves: Cranial nerves 2-12 are intact.     Motor: Motor function is intact.     Coordination: Coordination is intact.     Gait: Gait is intact.  Psychiatric:        Attention and Perception: Attention and perception normal.        Mood and Affect: Mood and affect normal.        Speech: Speech normal.        Behavior: Behavior normal. Behavior is cooperative.        Thought Content: Thought content normal.        Cognition and Memory: Cognition and memory normal.        Judgment: Judgment normal.     Assessment  Plan  Acute cystitis without hematuria - Plan: Urinalysis, Routine w reflex microscopic, Urine Culture, ciprofloxacin (CIPRO) 500 MG/5ML (10%) suspension  Yeast infection - Plan: fluconazole (DIFLUCAN) 10 MG/ML suspension  Provider: Dr. Olivia Mackie McLean-Scocuzza-Internal Medicine

## 2022-02-09 ENCOUNTER — Encounter: Payer: Self-pay | Admitting: Pharmacist

## 2022-02-09 ENCOUNTER — Other Ambulatory Visit: Payer: Self-pay

## 2022-02-09 MED ORDER — FLUCONAZOLE 10 MG/ML PO SUSR
150.0000 mg | Freq: Every day | ORAL | 0 refills | Status: DC
  Filled 2022-02-09: qty 35, 2d supply, fill #0

## 2022-02-09 MED ORDER — CIPRO 500 MG/5ML (10%) PO SUSR
500.0000 mg | Freq: Two times a day (BID) | ORAL | 0 refills | Status: DC
  Filled 2022-02-09 (×2): qty 100, 10d supply, fill #0

## 2022-02-09 NOTE — Telephone Encounter (Signed)
Pt has been informed that her meds have been sent to [facility] address and phone number have been given

## 2022-02-09 NOTE — Telephone Encounter (Signed)
Pt called stating she does not have the medication. The pharmacy stated the medication is on back order and they cannot get it. Pt would like to be called

## 2022-02-09 NOTE — Addendum Note (Signed)
Addended by: Orland Mustard on: 02/09/2022 04:26 PM   Modules accepted: Orders

## 2022-02-10 ENCOUNTER — Telehealth: Payer: Self-pay

## 2022-02-10 ENCOUNTER — Other Ambulatory Visit: Payer: Self-pay

## 2022-02-10 LAB — URINALYSIS, ROUTINE W REFLEX MICROSCOPIC
Bacteria, UA: NONE SEEN /HPF
Bilirubin Urine: NEGATIVE
Glucose, UA: NEGATIVE
Hgb urine dipstick: NEGATIVE
Hyaline Cast: NONE SEEN /LPF
Ketones, ur: NEGATIVE
Nitrite: POSITIVE — AB
Protein, ur: NEGATIVE
Specific Gravity, Urine: 1.006 (ref 1.001–1.035)
pH: 6.5 (ref 5.0–8.0)

## 2022-02-10 LAB — URINE CULTURE
MICRO NUMBER:: 14000823
SPECIMEN QUALITY:: ADEQUATE

## 2022-02-10 LAB — MICROSCOPIC MESSAGE

## 2022-02-10 MED ORDER — AMOXICILLIN-POT CLAVULANATE 400-57 MG/5ML PO SUSR
11.0000 mL | Freq: Two times a day (BID) | ORAL | 0 refills | Status: DC
  Filled 2022-02-10: qty 200, 9d supply, fill #0

## 2022-02-10 NOTE — Telephone Encounter (Signed)
LMTCB regarding lab results:  Uti E coli can treat with cipro or Augmentin liquid

## 2022-02-10 NOTE — Telephone Encounter (Signed)
-----   Message from Delorise Jackson, MD sent at 02/10/2022 12:21 PM EDT ----- Uti E coli can treat with cipro or Augmentin liquid

## 2022-02-13 ENCOUNTER — Other Ambulatory Visit (INDEPENDENT_AMBULATORY_CARE_PROVIDER_SITE_OTHER): Payer: PPO

## 2022-02-13 DIAGNOSIS — R319 Hematuria, unspecified: Secondary | ICD-10-CM

## 2022-02-13 LAB — URINALYSIS, ROUTINE W REFLEX MICROSCOPIC
Bilirubin Urine: NEGATIVE
Hgb urine dipstick: NEGATIVE
Ketones, ur: NEGATIVE
Nitrite: NEGATIVE
RBC / HPF: NONE SEEN (ref 0–?)
Specific Gravity, Urine: <=1.005 — AB (ref 1.000–1.030)
Total Protein, Urine: NEGATIVE
Urine Glucose: NEGATIVE
Urobilinogen, UA: 0.2 (ref 0.0–1.0)
pH: 6.5 (ref 5.0–8.0)

## 2022-02-14 ENCOUNTER — Ambulatory Visit
Admission: RE | Admit: 2022-02-14 | Discharge: 2022-02-14 | Disposition: A | Discharge status: Home / Self Care | Payer: PPO | Attending: Gastroenterology | Admitting: Gastroenterology

## 2022-02-14 ENCOUNTER — Ambulatory Visit: Payer: PPO | Admitting: Anesthesiology

## 2022-02-14 ENCOUNTER — Encounter
Admission: RE | Disposition: A | Discharge status: Home / Self Care | Payer: Self-pay | Source: Home / Self Care | Attending: Gastroenterology

## 2022-02-14 ENCOUNTER — Other Ambulatory Visit: Payer: Self-pay

## 2022-02-14 ENCOUNTER — Encounter: Payer: Self-pay | Admitting: Gastroenterology

## 2022-02-14 DIAGNOSIS — G4730 Sleep apnea, unspecified: Secondary | ICD-10-CM | POA: Diagnosis not present

## 2022-02-14 DIAGNOSIS — K640 First degree hemorrhoids: Secondary | ICD-10-CM | POA: Diagnosis not present

## 2022-02-14 DIAGNOSIS — D509 Iron deficiency anemia, unspecified: Secondary | ICD-10-CM | POA: Diagnosis not present

## 2022-02-14 DIAGNOSIS — K635 Polyp of colon: Secondary | ICD-10-CM

## 2022-02-14 DIAGNOSIS — K449 Diaphragmatic hernia without obstruction or gangrene: Secondary | ICD-10-CM | POA: Diagnosis not present

## 2022-02-14 DIAGNOSIS — K219 Gastro-esophageal reflux disease without esophagitis: Secondary | ICD-10-CM | POA: Diagnosis not present

## 2022-02-14 DIAGNOSIS — I10 Essential (primary) hypertension: Secondary | ICD-10-CM | POA: Insufficient documentation

## 2022-02-14 DIAGNOSIS — F32A Depression, unspecified: Secondary | ICD-10-CM | POA: Insufficient documentation

## 2022-02-14 DIAGNOSIS — Z79899 Other long term (current) drug therapy: Secondary | ICD-10-CM | POA: Diagnosis not present

## 2022-02-14 DIAGNOSIS — D500 Iron deficiency anemia secondary to blood loss (chronic): Secondary | ICD-10-CM

## 2022-02-14 DIAGNOSIS — J45909 Unspecified asthma, uncomplicated: Secondary | ICD-10-CM | POA: Diagnosis not present

## 2022-02-14 DIAGNOSIS — Z1211 Encounter for screening for malignant neoplasm of colon: Secondary | ICD-10-CM | POA: Diagnosis not present

## 2022-02-14 HISTORY — DX: Sleep apnea, unspecified: G47.30

## 2022-02-14 HISTORY — DX: Hyperlipidemia, unspecified: E78.5

## 2022-02-14 HISTORY — PX: COLONOSCOPY WITH PROPOFOL: SHX5780

## 2022-02-14 HISTORY — PX: ESOPHAGOGASTRODUODENOSCOPY: SHX5428

## 2022-02-14 SURGERY — COLONOSCOPY WITH PROPOFOL
Anesthesia: General

## 2022-02-14 MED ORDER — PROPOFOL 10 MG/ML IV BOLUS
INTRAVENOUS | Status: DC | PRN
  Administered 2022-02-14: 50 mg via INTRAVENOUS
  Administered 2022-02-14: 20 mg via INTRAVENOUS

## 2022-02-14 MED ORDER — PROPOFOL 500 MG/50ML IV EMUL
INTRAVENOUS | Status: DC | PRN
  Administered 2022-02-14: 150 ug/kg/min via INTRAVENOUS

## 2022-02-14 MED ORDER — LIDOCAINE HCL (CARDIAC) PF 100 MG/5ML IV SOSY
PREFILLED_SYRINGE | INTRAVENOUS | Status: DC | PRN
  Administered 2022-02-14: 50 mg via INTRAVENOUS

## 2022-02-14 MED ORDER — SODIUM CHLORIDE 0.9 % IV SOLN: INTRAVENOUS | Status: DC

## 2022-02-14 NOTE — Op Note (Signed)
[facility] Patient Name: Susan Miller Procedure Date: 02/14/2022 9:27 AM MRN: [id number] Account #: [id number] Date of Birth: [birth year] Admit Type: Outpatient Age: 76 Room: [facility] ENDO ROOM 4 Gender: Female Note Status: Finalized Instrument Name: Upper Endoscope [id number] Procedure:             Upper GI endoscopy Indications:           Iron deficiency anemia Providers:             Lucilla Lame MD, MD Referring MD:          Einar Pheasant, MD (Referring MD) Medicines:             Propofol per Anesthesia Complications:         No immediate complications. Procedure:             Pre-Anesthesia Assessment:                        - Prior to the procedure, a History and Physical was                         performed, and patient medications and allergies were                         reviewed. The patient's tolerance of previous                         anesthesia was also reviewed. The risks and benefits                         of the procedure and the sedation options and risks                         were discussed with the patient. All questions were                         answered, and informed consent was obtained. Prior                         Anticoagulants: The patient has taken no previous                         anticoagulant or antiplatelet agents. ASA Grade                         Assessment: II - A patient with mild systemic disease.                         After reviewing the risks and benefits, the patient                         was deemed in satisfactory condition to undergo the                         procedure.                        After obtaining informed consent, the endoscope was  passed under direct vision. Throughout the procedure,                         the patient's blood pressure, pulse, and oxygen                         saturations were monitored continuously. The Endoscope                          was introduced through the mouth, and advanced to the                         second part of duodenum. The upper GI endoscopy was                         accomplished without difficulty. The patient tolerated                         the procedure well. Findings:      A small hiatal hernia was present.      The stomach was normal.      The examined duodenum was normal.      Polyp at the base of the tounge Impression:            - Small hiatal hernia.                        - Normal stomach.                        - Normal examined duodenum.                        - Polyp at the base of the tounge                        - No specimens collected. Recommendation:        - Discharge patient to home.                        - Resume previous diet.                        - Continue present medications.                        - Perform a colonoscopy today. Procedure Code(s):     --- Professional ---                        [phone], Esophagogastroduodenoscopy, flexible,                         transoral; diagnostic, including collection of                         specimen(s) by brushing or washing, when performed                         (separate procedure) Diagnosis Code(s):     --- Professional ---  D50.9, Iron deficiency anemia, unspecified CPT copyright 2019 American Medical Association. All rights reserved. The codes documented in this report are preliminary and upon coder review may  be revised to meet current compliance requirements. Lucilla Lame MD, MD 02/14/2022 9:40:55 AM This report has been signed electronically. Number of Addenda: 0 Note Initiated On: 02/14/2022 9:27 AM Estimated Blood Loss:  Estimated blood loss: none.      [facility]

## 2022-02-14 NOTE — Anesthesia Preprocedure Evaluation (Signed)
Anesthesia Evaluation  Patient identified by MRN, date of birth, ID band Patient awake    Reviewed: Allergy & Precautions, NPO status , Patient's Chart, lab work & pertinent test results  History of Anesthesia Complications Negative for: history of anesthetic complications  Airway Mallampati: III  TM Distance: >3 FB Neck ROM: full    Dental  (+) Chipped   Pulmonary asthma , sleep apnea ,    Pulmonary exam normal        Cardiovascular hypertension, On Medications negative cardio ROS Normal cardiovascular exam     Neuro/Psych  Headaches, PSYCHIATRIC DISORDERS Depression    GI/Hepatic Neg liver ROS, GERD  Medicated,  Endo/Other  negative endocrine ROS  Renal/GU negative Renal ROS  negative genitourinary   Musculoskeletal   Abdominal   Peds  Hematology   Anesthesia Other Findings Past Medical History: No date: Allergy No date: Depression No date: History of chicken pox No date: Hx: UTI (urinary tract infection) No date: Hypertension  Past Surgical History: 06/20/2016: COLONOSCOPY WITH PROPOFOL; N/A     Comment:  Procedure: COLONOSCOPY WITH PROPOFOL;  Surgeon: Lucilla Lame, MD;  Location: [facility] ENDOSCOPY;  Service: Endoscopy;              Laterality: N/A; 06/20/2016: ESOPHAGOGASTRODUODENOSCOPY (EGD) WITH PROPOFOL; N/A     Comment:  Procedure: ESOPHAGOGASTRODUODENOSCOPY (EGD) WITH               PROPOFOL;  Surgeon: Lucilla Lame, MD;  Location: [facility]               ENDOSCOPY;  Service: Endoscopy;  Laterality: N/A; No date: TONSILLECTOMY     Comment:  as a child No date: TUMOR REMOVAL     Reproductive/Obstetrics negative OB ROS                             Anesthesia Physical Anesthesia Plan  ASA: 3  Anesthesia Plan: General   Post-op Pain Management: Minimal or no pain anticipated   Induction:   PONV Risk Score and Plan: Propofol infusion and TIVA  Airway Management  Planned: Natural Airway and Nasal Cannula  Additional Equipment:   Intra-op Plan:   Post-operative Plan:   Informed Consent: I have reviewed the patients History and Physical, chart, labs and discussed the procedure including the risks, benefits and alternatives for the proposed anesthesia with the patient or authorized representative who has indicated his/her understanding and acceptance.     Dental Advisory Given  Plan Discussed with: Anesthesiologist, CRNA and Surgeon  Anesthesia Plan Comments:        Anesthesia Quick Evaluation

## 2022-02-14 NOTE — Transfer of Care (Signed)
Immediate Anesthesia Transfer of Care Note  Patient: SMANTHA BOAKYE  Procedure(s) Performed: COLONOSCOPY WITH PROPOFOL ESOPHAGOGASTRODUODENOSCOPY (EGD)  Patient Location: PACU and Endoscopy Unit  Anesthesia Type:General  Level of Consciousness: drowsy and patient cooperative  Airway & Oxygen Therapy: Patient Spontanous Breathing  Post-op Assessment: Report given to RN and Post -op Vital signs reviewed and stable  Post vital signs: Reviewed and stable  Last Vitals:  Vitals Value Taken Time  BP 107/61 02/14/22 1003  Temp    Pulse 76 02/14/22 1003  Resp 16 02/14/22 1003  SpO2 96 % 02/14/22 1003  Vitals shown include unvalidated device data.  Last Pain:  Vitals:   02/14/22 1002  TempSrc:   PainSc: 0-No pain         Complications: No notable events documented.

## 2022-02-14 NOTE — Op Note (Signed)
[facility] Gastroenterology Patient Name: Susan Miller Procedure Date: 02/14/2022 9:26 AM MRN: [id number] Account #: [id number] Date of Birth: 11/01/1945 Admit Type: Outpatient Age: 76 Room: [facility] ENDO ROOM 4 Gender: Female Note Status: Finalized Instrument Name: Jasper Riling [id number] Procedure:             Colonoscopy Indications:           Iron deficiency anemia Providers:             Lucilla Lame MD, MD Referring MD:          Einar Pheasant, MD (Referring MD) Medicines:             Propofol per Anesthesia Complications:         No immediate complications. Procedure:             Pre-Anesthesia Assessment:                        - Prior to the procedure, a History and Physical was                         performed, and patient medications and allergies were                         reviewed. The patient's tolerance of previous                         anesthesia was also reviewed. The risks and benefits                         of the procedure and the sedation options and risks                         were discussed with the patient. All questions were                         answered, and informed consent was obtained. Prior                         Anticoagulants: The patient has taken no previous                         anticoagulant or antiplatelet agents. ASA Grade                         Assessment: II - A patient with mild systemic disease.                         After reviewing the risks and benefits, the patient                         was deemed in satisfactory condition to undergo the                         procedure.                        After obtaining informed consent, the colonoscope was  passed under direct vision. Throughout the procedure,                         the patient's blood pressure, pulse, and oxygen                         saturations were monitored continuously. The                         Colonoscope was  introduced through the anus and                         advanced to the the cecum, identified by appendiceal                         orifice and ileocecal valve. The colonoscopy was                         performed without difficulty. The patient tolerated                         the procedure well. The quality of the bowel                         preparation was good. Findings:      The perianal and digital rectal examinations were normal.      A 6 mm polyp was found in the ascending colon. The polyp was sessile.       The polyp was removed with a cold snare. Resection and retrieval were       complete.      Non-bleeding internal hemorrhoids were found during retroflexion. The       hemorrhoids were Grade I (internal hemorrhoids that do not prolapse). Impression:            - One 6 mm polyp in the ascending colon, removed with                         a cold snare. Resected and retrieved.                        - Non-bleeding internal hemorrhoids. Recommendation:        - Discharge patient to home.                        - Resume previous diet.                        - Continue present medications.                        - Await pathology results.                        - To visualize the small bowel, perform video capsule                         endoscopy at appointment to be scheduled. Procedure Code(s):     --- Professional ---  45385, Colonoscopy, flexible; with removal of                         tumor(s), polyp(s), or other lesion(s) by snare                         technique Diagnosis Code(s):     --- Professional ---                        D50.9, Iron deficiency anemia, unspecified                        K63.5, Polyp of colon CPT copyright 2019 American Medical Association. All rights reserved. The codes documented in this report are preliminary and upon coder review may  be revised to meet current compliance requirements. Lucilla Lame MD, MD 02/14/2022  9:59:36 AM This report has been signed electronically. Number of Addenda: 0 Note Initiated On: 02/14/2022 9:26 AM Scope Withdrawal Time: 0 hours 7 minutes 26 seconds  Total Procedure Duration: 0 hours 15 minutes 50 seconds  Estimated Blood Loss:  Estimated blood loss: none.      [facility]

## 2022-02-14 NOTE — Anesthesia Procedure Notes (Signed)
Procedure Name: MAC Date/Time: 02/14/2022 9:30 AM  Performed by: Jerrye Noble, CRNAPre-anesthesia Checklist: Patient identified, Emergency Drugs available, Suction available and Patient being monitored Patient Re-evaluated:Patient Re-evaluated prior to induction Oxygen Delivery Method: Nasal cannula

## 2022-02-14 NOTE — Anesthesia Postprocedure Evaluation (Signed)
Anesthesia Post Note  Patient: Susan Miller  Procedure(s) Performed: COLONOSCOPY WITH PROPOFOL ESOPHAGOGASTRODUODENOSCOPY (EGD)  Patient location during evaluation: Endoscopy Anesthesia Type: General Level of consciousness: awake and alert Pain management: pain level controlled Vital Signs Assessment: post-procedure vital signs reviewed and stable Respiratory status: spontaneous breathing, nonlabored ventilation, respiratory function stable and patient connected to nasal cannula oxygen Cardiovascular status: blood pressure returned to baseline and stable Postop Assessment: no apparent nausea or vomiting Anesthetic complications: no   No notable events documented.   Last Vitals:  Vitals:   02/14/22 0910 02/14/22 1002  BP: 133/74 107/61  Pulse: 83   Resp: 16   Temp: (!) 36.1 C   SpO2: 97%     Last Pain:  Vitals:   02/14/22 1002  TempSrc:   PainSc: 0-No pain                 Ilene Qua

## 2022-02-14 NOTE — H&P (Signed)
Midge Minium, MD [facility] [address] [place], [place] [address] Phone:[phone] Fax : [phone]  Primary Care Physician:  Dale Alma, MD Primary Gastroenterologist:  Dr. Servando Snare  Pre-Procedure History & Physical: HPI:  Susan Miller is a 76 y.o. female is here for an endoscopy and colonoscopy.   Past Medical History:  Diagnosis Date   Allergy    Depression    History of chicken pox    HLD (hyperlipidemia)    Hx: UTI (urinary tract infection)    Hypertension    Sleep apnea     Past Surgical History:  Procedure Laterality Date   COLONOSCOPY WITH PROPOFOL N/A 06/20/2016   Procedure: COLONOSCOPY WITH PROPOFOL;  Surgeon: Midge Minium, MD;  Location: [facility] ENDOSCOPY;  Service: Endoscopy;  Laterality: N/A;   ESOPHAGOGASTRODUODENOSCOPY (EGD) WITH PROPOFOL N/A 06/20/2016   Procedure: ESOPHAGOGASTRODUODENOSCOPY (EGD) WITH PROPOFOL;  Surgeon: Midge Minium, MD;  Location: [facility] ENDOSCOPY;  Service: Endoscopy;  Laterality: N/A;   TONSILLECTOMY     as a child   TUMOR REMOVAL     behind ear    Prior to Admission medications   Medication Sig Start Date End Date Taking? Authorizing Provider  Cyanocobalamin (VITAMIN B-12 IJ) Inject as directed. Weekly for 3 weeks and then monthly   Yes [provider]  esomeprazole (NEXIUM) 40 MG capsule TAKE 1 CAPSULE DAILY AT 12 NOON 03/11/21  Yes Dale Maish Vaya, MD  ferrous sulfate (FEROSUL) 220 (44 Fe) MG/5ML solution Take 5 mLs (220 mg total) by mouth daily. 04/13/21  Yes Dale [place], MD  hydrochlorothiazide (HYDRODIURIL) 25 MG tablet TAKE 1/2 TABLET BY MOUTH EVERY DAY 11/07/21  Yes Worthy Rancher B, FNP  losartan (COZAAR) 25 MG tablet TAKE 1 TABLET(25 MG) BY MOUTH DAILY 11/20/21  Yes Worthy Rancher B, FNP  Na Sulfate-K Sulfate-Mg Sulf 17.5-3.13-1.6 GM/177ML SOLN See admin instructions. follow package directions 06/02/21  Yes [provider]  omeprazole (PRILOSEC) 40 MG capsule TAKE 1 CAPSULE BY MOUTH EVERY DAY AT NOON  11/20/21  Yes Worthy Rancher B, FNP  rosuvastatin (CRESTOR) 10 MG tablet TAKE 1 TABLET BY MOUTH EVERY DAY 11/20/21  Yes Worthy Rancher B, FNP  albuterol (VENTOLIN HFA) 108 (90 Base) MCG/ACT inhaler Inhale 2 puffs into the lungs every 6 (six) hours as needed for wheezing or shortness of breath. 08/09/21   Salena Saner, MD  amoxicillin-clavulanate (AUGMENTIN) 400-57 MG/5ML suspension Take 11 mLs by mouth 2 (two) times daily for 7 days - discard remainder 02/10/22   McLean-Scocuzza, Pasty Spillers, MD  cefdinir (OMNICEF) 250 MG/5ML suspension Take 6 mLs (300 mg total) by mouth 2 (two) times daily. For 5 days 01/25/22   Dale [place], MD  ciprofloxacin (CIPRO) 500 MG/5ML (10%) suspension Take 5 mLs (500 mg total) by mouth 2 (two) times daily. Do NOT administer via tube 02/09/22   McLean-Scocuzza, Pasty Spillers, MD  diclofenac Sodium (VOLTAREN) 1 % GEL Apply 4 g topically 4 (four) times daily. 12/13/21   Dana Allan, MD  estradiol (ESTRACE) 0.1 MG/GM vaginal cream Estrogen Cream Instruction Discard applicator Apply pea sized amount to tip of finger to urethra before bed. Wash hands well after application. Use Monday, Wednesday and Friday 06/23/21   Vanna Scotland, MD  fluconazole (DIFLUCAN) 10 MG/ML suspension Take 15 mLs (150 mg total) by mouth daily. X 1 yeast infection prevention 02/09/22   McLean-Scocuzza, Pasty Spillers, MD  Fluticasone Propionate, Inhal, (FLOVENT DISKUS) 100 MCG/ACT AEPB Inhale 1 puff into the lungs 2 (two) times daily. 10/11/21  Salena Saner, MD  sertraline (ZOLOFT) 25 MG tablet TAKE 1 TABLET BY MOUTH EVERY DAY 09/19/21   Dale [place], MD    Allergies as of 01/03/2022 - Review Complete 12/19/2021  Allergen Reaction Noted   Codeine Hives 12/13/2012   Iodinated contrast media Swelling 03/03/2015   Nitrofurantoin monohyd macro Nausea Only 03/03/2015    Family History  Problem Relation Age of Onset   Hypertension Mother    Cancer Sister        colon cancer   Hypertension Sister    Diabetes  Sister    Hypertension Brother    Hypertension Maternal Aunt    Breast cancer Maternal Aunt 60   Hypertension Maternal Uncle    Stroke Maternal Grandmother    Hypertension Maternal Grandmother     Social History   Socioeconomic History   Marital status: Divorced    Spouse name: Not on file   Number of children: Not on file   Years of education: Not on file   Highest education level: Not on file  Occupational History   Not on file  Tobacco Use   Smoking status: Never   Smokeless tobacco: Never  Vaping Use   Vaping Use: Never used  Substance and Sexual Activity   Alcohol use: No    Alcohol/week: 0.0 standard drinks of alcohol   Drug use: No   Sexual activity: Not Currently  Other Topics Concern   Not on file  Social History Narrative   Not on file   Social Determinants of Health   Financial Resource Strain: Low Risk  (05/17/2021)   Overall Financial Resource Strain (CARDIA)    Difficulty of Paying Living Expenses: Not hard at all  Food Insecurity: No Food Insecurity (05/17/2021)   Hunger Vital Sign    Worried About Running Out of Food in the Last Year: Never true    Ran Out of Food in the Last Year: Never true  Transportation Needs: No Transportation Needs (05/17/2021)   PRAPARE - Administrator, Civil Service (Medical): No    Lack of Transportation (Non-Medical): No  Physical Activity: Unknown (05/14/2020)   Exercise Vital Sign    Days of Exercise per Week: 0 days    Minutes of Exercise per Session: Not on file  Stress: No Stress Concern Present (05/17/2021)   [employer] of Occupational Health - Occupational Stress Questionnaire    Feeling of Stress : Not at all  Social Connections: Unknown (05/17/2021)   Social Connection and Isolation Panel [NHANES]    Frequency of Communication with Friends and Family: More than three times a week    Frequency of Social Gatherings with Friends and Family: More than three times a week    Attends Religious  Services: Not on file    Active Member of Clubs or Organizations: Not on file    Attends Banker Meetings: Not on file    Marital Status: Not on file  Intimate Partner Violence: Not At Risk (05/17/2021)   Humiliation, Afraid, Rape, and Kick questionnaire    Fear of Current or Ex-Partner: No    Emotionally Abused: No    Physically Abused: No    Sexually Abused: No    Review of Systems: See HPI, otherwise negative ROS  Physical Exam: BP 107/61   Pulse 83   Temp (!) 97 F (36.1 C) (Temporal)   Resp 16   Ht 5\' 7"  (1.702 m)   Wt 103.9 kg   SpO2 97%  BMI 35.87 kg/m  General:   Alert,  pleasant and cooperative in NAD Head:  Normocephalic and atraumatic. Neck:  Supple; no masses or thyromegaly. Lungs:  Clear throughout to auscultation.    Heart:  Regular rate and rhythm. Abdomen:  Soft, nontender and nondistended. Normal bowel sounds, without guarding, and without rebound.   Neurologic:  Alert and  oriented x4;  grossly normal neurologically.  Impression/Plan: Susan Miller is here for an endoscopy and colonoscopy to be performed for iron deficiency anemia  Risks, benefits, limitations, and alternatives regarding  endoscopy and colonoscopy have been reviewed with the patient.  Questions have been answered.  All parties agreeable.   Lucilla Lame, MD  02/14/2022, 10:08 AM

## 2022-02-15 LAB — SURGICAL PATHOLOGY

## 2022-02-16 ENCOUNTER — Encounter: Payer: Self-pay | Admitting: Gastroenterology

## 2022-02-18 DIAGNOSIS — G4733 Obstructive sleep apnea (adult) (pediatric): Secondary | ICD-10-CM | POA: Diagnosis not present

## 2022-02-19 ENCOUNTER — Ambulatory Visit
Admission: EM | Admit: 2022-02-19 | Discharge: 2022-02-19 | Disposition: A | Discharge status: Home / Self Care | Payer: PPO | Attending: Urgent Care | Admitting: Urgent Care

## 2022-02-19 DIAGNOSIS — R300 Dysuria: Secondary | ICD-10-CM | POA: Diagnosis not present

## 2022-02-19 DIAGNOSIS — N3001 Acute cystitis with hematuria: Secondary | ICD-10-CM | POA: Diagnosis not present

## 2022-02-19 DIAGNOSIS — N390 Urinary tract infection, site not specified: Secondary | ICD-10-CM | POA: Insufficient documentation

## 2022-02-19 LAB — POCT URINALYSIS DIP (MANUAL ENTRY)
Glucose, UA: 100 mg/dL — AB
Nitrite, UA: POSITIVE — AB
Protein Ur, POC: >=300 mg/dL — AB
Spec Grav, UA: 1.02 (ref 1.010–1.025)
Urobilinogen, UA: 2 E.U./dL — AB
pH, UA: 5.5 (ref 5.0–8.0)

## 2022-02-19 MED ORDER — SULFAMETHOXAZOLE-TRIMETHOPRIM 200-40 MG/5ML PO SUSP: 20.0000 mL | Freq: Two times a day (BID) | ORAL | 0 refills | Status: AC

## 2022-02-19 NOTE — ED Provider Notes (Signed)
[facility]    CSN: [id number] Arrival date & time: 02/19/22  1144      History   Chief Complaint Chief Complaint  Patient presents with   Urinary Frequency   Dysuria    HPI Susan Miller is a 76 y.o. female.    Urinary Frequency  Dysuria   Presents to UC with complaint of dysuria and urinary frequency.  She states she was diagnosed and treated for UTI 2 weeks ago but stopped the prescribed Augmentin due to a procedure.  Shows UA and UC completed on 10 3 with E. coli susceptible to Augmentin resistant to ampicillin.  Patient was given a prescription for Augmentin and she says she missed 1 day when she was prepping for colonoscopy.  Past Medical History:  Diagnosis Date   Allergy    Depression    History of chicken pox    HLD (hyperlipidemia)    Hx: UTI (urinary tract infection)    Hypertension    Sleep apnea     Patient Active Problem List   Diagnosis Date Noted   Polyp of ascending colon    Knee pain, right 12/13/2021   Major depressive disorder, single episode, mild (HCC) 12/11/2021   Anemia 04/10/2021   Aortic atherosclerosis (HCC) 04/10/2021   UTI (urinary tract infection) 03/27/2021   Cutaneous skin tags 12/12/2020   B12 deficiency 12/12/2020   OSA (obstructive sleep apnea) 04/18/2020   Numbness and tingling of both feet 02/20/2020   Daytime hypersomnolence 01/29/2020   Dysuria 12/07/2019   Snoring 12/07/2019   Hypercholesteremia 10/09/2019   Back pain 11/15/2018   SOB (shortness of breath) 11/15/2018   Decreased GFR 11/14/2018   Hyperbilirubinemia 11/14/2018   Abdominal bloating 12/09/2017   Osteopenia 04/29/2017   Hyperglycemia 04/27/2017   Reflux esophagitis    Heartburn    Special screening for malignant neoplasms, colon    Hair loss 10/26/2015   Fatigue 10/21/2015   Vitamin D deficiency 03/15/2015   Weight loss counseling, encounter for 02/22/2015   Facial erythema 07/27/2014   Health care maintenance 07/05/2014   BP (high  blood pressure) 03/26/2014   Headache 03/08/2014   Eyelid lesion 03/08/2014   Shingles 11/16/2013   Light headedness 06/22/2013   Cough 05/18/2013   GERD (gastroesophageal reflux disease) 05/18/2013   Swelling of extremity 03/01/2013   Left knee pain 03/01/2013   Essential hypertension, benign 12/15/2012   Frequent UTI 12/15/2012   Mild depression 12/15/2012   Environmental allergies 12/15/2012    Past Surgical History:  Procedure Laterality Date   COLONOSCOPY WITH PROPOFOL N/A 06/20/2016   Procedure: COLONOSCOPY WITH PROPOFOL;  Surgeon: Midge Minium, MD;  Location: [facility] ENDOSCOPY;  Service: Endoscopy;  Laterality: N/A;   COLONOSCOPY WITH PROPOFOL N/A 02/14/2022   Procedure: COLONOSCOPY WITH PROPOFOL;  Surgeon: Midge Minium, MD;  Location: [facility] ENDOSCOPY;  Service: Endoscopy;  Laterality: N/A;   ESOPHAGOGASTRODUODENOSCOPY N/A 02/14/2022   Procedure: ESOPHAGOGASTRODUODENOSCOPY (EGD);  Surgeon: Midge Minium, MD;  Location: [facility] ENDOSCOPY;  Service: Endoscopy;  Laterality: N/A;   ESOPHAGOGASTRODUODENOSCOPY (EGD) WITH PROPOFOL N/A 06/20/2016   Procedure: ESOPHAGOGASTRODUODENOSCOPY (EGD) WITH PROPOFOL;  Surgeon: Midge Minium, MD;  Location: [facility] ENDOSCOPY;  Service: Endoscopy;  Laterality: N/A;   TONSILLECTOMY     as a child   TUMOR REMOVAL     behind ear    OB History   No obstetric history on file.      Home Medications    Prior to Admission medications   Medication Sig Start Date End  Date Taking? Authorizing Provider  estradiol (ESTRACE) 0.1 MG/GM vaginal cream Place vaginally. 06/23/21  Yes [provider]  losartan (COZAAR) 25 MG tablet Take by mouth. 11/20/21  Yes [provider]  omeprazole (PRILOSEC) 40 MG capsule Take by mouth. 11/20/21  Yes [provider]  albuterol (VENTOLIN HFA) 108 (90 Base) MCG/ACT inhaler Inhale 2 puffs into the lungs every 6 (six) hours as needed for wheezing or shortness of breath. 08/09/21   Tyler Pita, MD   amoxicillin-clavulanate (AUGMENTIN) 400-57 MG/5ML suspension Take 11 mLs by mouth 2 (two) times daily for 7 days - discard remainder 02/10/22   McLean-Scocuzza, Nino Glow, MD  cefdinir (OMNICEF) 250 MG/5ML suspension Take 6 mLs (300 mg total) by mouth 2 (two) times daily. For 5 days 01/25/22   Einar Pheasant, MD  celecoxib (CELEBREX) 100 MG capsule Take 100 mg by mouth 2 (two) times daily. 01/26/22   [provider]  ciprofloxacin (CIPRO) 500 MG/5ML (10%) suspension Take 5 mLs (500 mg total) by mouth 2 (two) times daily. Do NOT administer via tube 02/09/22   McLean-Scocuzza, Nino Glow, MD  CLENPIQ 10-3.5-12 MG-GM -GM/175ML SOLN Take by mouth once. 01/19/22   [provider]  Cyanocobalamin (VITAMIN B-12 IJ) Inject as directed. Weekly for 3 weeks and then monthly    [provider]  diclofenac Sodium (VOLTAREN) 1 % GEL Apply 4 g topically 4 (four) times daily. 12/13/21   Carollee Leitz, MD  esomeprazole (NEXIUM) 40 MG capsule TAKE 1 CAPSULE DAILY AT 12 NOON 03/11/21   Einar Pheasant, MD  estradiol (ESTRACE) 0.1 MG/GM vaginal cream Estrogen Cream Instruction Discard applicator Apply pea sized amount to tip of finger to urethra before bed. Wash hands well after application. Use Monday, Wednesday and Friday 06/23/21   Hollice Espy, MD  ferrous sulfate (FEROSUL) 220 (44 Fe) MG/5ML solution Take 5 mLs (220 mg total) by mouth daily. 04/13/21   Einar Pheasant, MD  fluconazole (DIFLUCAN) 10 MG/ML suspension Take 15 mLs (150 mg total) by mouth daily. X 1 yeast infection prevention 02/09/22   McLean-Scocuzza, Nino Glow, MD  Fluticasone Propionate, Inhal, (FLOVENT DISKUS) 100 MCG/ACT AEPB Inhale 1 puff into the lungs 2 (two) times daily. 10/11/21   Tyler Pita, MD  hydrochlorothiazide (HYDRODIURIL) 25 MG tablet TAKE 1/2 TABLET BY MOUTH EVERY DAY 11/07/21   Dutch Quint B, FNP  losartan (COZAAR) 25 MG tablet TAKE 1 TABLET(25 MG) BY MOUTH DAILY 11/20/21   Dutch Quint B, FNP  Na Sulfate-K  Sulfate-Mg Sulf 17.5-3.13-1.6 GM/177ML SOLN See admin instructions. follow package directions 06/02/21   [provider]  omeprazole (PRILOSEC) 40 MG capsule TAKE 1 CAPSULE BY MOUTH EVERY DAY AT NOON 11/20/21   Dutch Quint B, FNP  rosuvastatin (CRESTOR) 10 MG tablet TAKE 1 TABLET BY MOUTH EVERY DAY 11/20/21   Dutch Quint B, FNP  sertraline (ZOLOFT) 25 MG tablet TAKE 1 TABLET BY MOUTH EVERY DAY 09/19/21   Einar Pheasant, MD    Family History Family History  Problem Relation Age of Onset   Hypertension Mother    Cancer Sister        colon cancer   Hypertension Sister    Diabetes Sister    Hypertension Brother    Hypertension Maternal Aunt    Breast cancer Maternal Aunt 60   Hypertension Maternal Uncle    Stroke Maternal Grandmother    Hypertension Maternal Grandmother     Social History Social History   Tobacco Use   Smoking status: Never  Smokeless tobacco: Never  Vaping Use   Vaping Use: Never used  Substance Use Topics   Alcohol use: No    Alcohol/week: 0.0 standard drinks of alcohol   Drug use: No     Allergies   Codeine, Iodinated contrast media, and Nitrofurantoin monohyd macro   Review of Systems Review of Systems  Genitourinary:  Positive for dysuria and frequency.     Physical Exam Triage Vital Signs ED Triage Vitals  Enc Vitals Group     BP 02/19/22 1233 122/68     Pulse Rate 02/19/22 1233 90     Resp 02/19/22 1233 18     Temp 02/19/22 1233 99.6 F (37.6 C)     Temp Source 02/19/22 1233 Oral     SpO2 02/19/22 1233 96 %     Weight --      Height --      Head Circumference --      Peak Flow --      Pain Score 02/19/22 1228 0     Pain Loc --      Pain Edu? --      Excl. in GC? --    No data found.  Updated Vital Signs BP 122/68 (BP Location: Left Arm)   Pulse 90   Temp 99.6 F (37.6 C) (Oral)   Resp 18   SpO2 96%   Visual Acuity Right Eye Distance:   Left Eye Distance:   Bilateral Distance:    Right Eye Near:   Left  Eye Near:    Bilateral Near:     Physical Exam Vitals reviewed.  Constitutional:      Appearance: Normal appearance.  Skin:    General: Skin is warm and dry.  Neurological:     General: No focal deficit present.     Mental Status: She is alert and oriented to person, place, and time.  Psychiatric:        Mood and Affect: Mood normal.        Behavior: Behavior normal.      UC Treatments / Results  Labs (all labs ordered are listed, but only abnormal results are displayed) Labs Reviewed  POCT URINALYSIS DIP (MANUAL ENTRY)    EKG   Radiology No results found.  Procedures Procedures (including critical care time)  Medications Ordered in UC Medications - No data to display  Initial Impression / Assessment and Plan / UC Course  I have reviewed the triage vital signs and the nursing notes.  Pertinent labs & imaging results that were available during my care of the patient were reviewed by me and considered in my medical decision making (see chart for details).   UA is positive for moderate blood, large leuks, nitrites.  Urine is cloudy.  Patient has been using Azo and urine color is orange.  Presumed E. coli recurrent infection.  Will treat with ciprofloxacin and send for confirmatory culture.   Final Clinical Impressions(s) / UC Diagnoses   Final diagnoses:  Frequent UTI  Dysuria   Discharge Instructions   None    ED Prescriptions   None    PDMP not reviewed this encounter.   Charma Igo, [place] 02/19/22 1308

## 2022-02-19 NOTE — Discharge Instructions (Addendum)
A sample of your urine was sent to the lab for culture.  If the results indicate that you require treatment with a different antibiotic, you will be called with information regarding new prescription.    Follow up here or with your primary care provider if your symptoms are worsening or not improving with treatment.

## 2022-02-19 NOTE — ED Triage Notes (Signed)
Pt. Presents to UC w/ c/o dysuria and urinary frequency. Pt. States she was diagnosed and treated for a UTI 2 weeks ago and had to stop her Augmentin treatment d/t a procedure. Pt. Has been treating herself w/ AZO.

## 2022-02-21 ENCOUNTER — Telehealth: Payer: Self-pay

## 2022-02-21 MED ORDER — CEFDINIR 250 MG/5ML PO SUSR: 300.0000 mg | Freq: Two times a day (BID) | ORAL | 0 refills | Status: DC

## 2022-02-21 NOTE — Telephone Encounter (Signed)
Patient stated that she has had no lip or tongues swelling. She stated that it was just itching which was some better today & better with Benadryl. She also will contact [facility] to verify which vaccine was given in each arm. She stated that left arm today was just splotchy  looking. Pt also already follows with Urology last seen in February & she will call them to make follow-up appointment (Dr. Erlene Quan). She did ask if you would treat her for this UTI & was okay with omnicef.

## 2022-02-21 NOTE — Telephone Encounter (Signed)
Need to confirm if doing ok today.  Confirm no lip swelling, tongue swelling, etc.  Reports itching is better.  Reviewed urine culture.  Does appear to have UTI.  I can send in rx for omnicef - had previously with no documented problems, but wanted to confirm no other issues first.  If any of above, needs to be seen.  Let me know.  Regarding her vaccines, can call pharmacy and see if they have which arm they gave each shot.  Given her persistent issue with recurring UTI, needs referral to urology for further evaluation and work up.  Let me know if agreeable and will need back to send in rx.

## 2022-02-21 NOTE — Telephone Encounter (Signed)
Patient states she went to [facility] on Sunday (02/19/2022) for a UTI.  Patient states they gave her an antibiotic (sulfameth-trimeth suspension 20 MG twice a day).  Patient states she took this medication Sunday night and Monday morning.  Patient states she started itching all over by Monday morning.  Patient states she stopped taking the antibiotic Monday night and patient states she started taking Benadryl.  Patient states the Benadryl helped because she is not itching as bad this morning.  Patient states she would like to see if Dr. Einar Pheasant can call in an antibiotic that she has taken in the past that worked for her.   Patient states she did have a covid vaccine and RSV vaccine on Friday (02/17/2022).  Patient states she did notice some redness around the injection site in left arm on Monday also.  Patient states the vaccines also made her heart race.  Patient states she is not sure which vaccine was in her left arm and which one was in her right arm.  Patient states she received her vaccine injections at [facility] on the corner of [address] and [address].

## 2022-02-21 NOTE — Telephone Encounter (Signed)
Rx sent in for omnicef to [facility].

## 2022-02-22 LAB — URINE CULTURE: Culture: >=100000 — AB

## 2022-02-23 NOTE — Telephone Encounter (Signed)
I spoke with patient & she started the [facility] yesterday. She said that she had been worried but today she was starting to feel much better.

## 2022-03-01 ENCOUNTER — Other Ambulatory Visit: Payer: Self-pay

## 2022-03-01 DIAGNOSIS — D500 Iron deficiency anemia secondary to blood loss (chronic): Secondary | ICD-10-CM

## 2022-03-08 ENCOUNTER — Ambulatory Visit: Payer: PPO | Admitting: Urology

## 2022-03-08 ENCOUNTER — Encounter: Payer: Self-pay | Admitting: Urology

## 2022-03-08 VITALS — BP 147/78 | HR 99 | Ht 67.0 in | Wt 226.6 lb

## 2022-03-08 DIAGNOSIS — R1030 Lower abdominal pain, unspecified: Secondary | ICD-10-CM | POA: Diagnosis not present

## 2022-03-08 DIAGNOSIS — N390 Urinary tract infection, site not specified: Secondary | ICD-10-CM

## 2022-03-08 DIAGNOSIS — R350 Frequency of micturition: Secondary | ICD-10-CM | POA: Diagnosis not present

## 2022-03-08 DIAGNOSIS — Z87440 Personal history of urinary (tract) infections: Secondary | ICD-10-CM

## 2022-03-08 DIAGNOSIS — R300 Dysuria: Secondary | ICD-10-CM | POA: Diagnosis not present

## 2022-03-08 LAB — URINALYSIS, COMPLETE
Bilirubin, UA: NEGATIVE
Glucose, UA: NEGATIVE
Ketones, UA: NEGATIVE
Nitrite, UA: NEGATIVE
Protein,UA: NEGATIVE
Specific Gravity, UA: 1.01 (ref 1.005–1.030)
Urobilinogen, Ur: 0.2 mg/dL (ref 0.2–1.0)
pH, UA: 7 (ref 5.0–7.5)

## 2022-03-08 LAB — MICROSCOPIC EXAMINATION: WBC, UA: >30 /hpf — AB (ref 0–5)

## 2022-03-08 LAB — BLADDER SCAN AMB NON-IMAGING

## 2022-03-08 MED ORDER — TRIMETHOPRIM 100 MG PO TABS: 100.0000 mg | ORAL_TABLET | Freq: Every day | ORAL | 0 refills | Status: DC

## 2022-03-08 MED ORDER — SULFAMETHOXAZOLE-TRIMETHOPRIM 800-160 MG PO TABS: 1.0000 | ORAL_TABLET | Freq: Two times a day (BID) | ORAL | 0 refills | Status: DC

## 2022-03-08 NOTE — Progress Notes (Signed)
03/08/2022 9:28 AM   Susan Miller Sep 03, 1945 [id number]  Referring provider: Dale [place], MD [address] [place],  [place] [address]  No chief complaint on file.   HPI: 76 year old female with a personal history of recurrent UTI, atrophic vaginitis, urethral carbuncle and chronic constipation who returns today concern for UTI.  She was last seen in 06/2021.  Please see previous notes for details.  Most recently, she ended up presenting to urgent care on 02/19/2022.  She was given Bactrim.  She had a adverse reaction with itching.  She took Benadryl and ultimately ended up having her antibiotics changed by her PCP Dr. Lorin Picket.  She also had positive urinalysis and urine cultures growing E. coli on 01/25/2022, 02/07/2022 on the above.  She reports today that her urinary symptoms include lower abdominal pressure, frequency, and some dysuria.  No fevers or chills.  She reports that with each round of antibiotics things get better but do not completely resolve.  Bactrim was not added to her allergy list, she is not sure if the itching was caused by the Bactrim or something else.  She is willing to try it again.  Urinalysis today with greater than 30 white blood cells, many bacteria, nitrate negative.  She is using d-mannose, cranberry tablets, daily probiotics and topical estrogen cream as outlined previously.  PMH: Past Medical History:  Diagnosis Date   Allergy    Depression    History of chicken pox    HLD (hyperlipidemia)    Hx: UTI (urinary tract infection)    Hypertension    Sleep apnea     Surgical History: Past Surgical History:  Procedure Laterality Date   COLONOSCOPY WITH PROPOFOL N/A 06/20/2016   Procedure: COLONOSCOPY WITH PROPOFOL;  Surgeon: Midge Minium, MD;  Location: [facility] ENDOSCOPY;  Service: Endoscopy;  Laterality: N/A;   COLONOSCOPY WITH PROPOFOL N/A 02/14/2022   Procedure: COLONOSCOPY WITH PROPOFOL;  Surgeon: Midge Minium, MD;   Location[facility] ENDOSCOPY;  Service: Endoscopy;  Laterality: N/A;   ESOPHAGOGASTRODUODENOSCOPY N/A 02/14/2022   Procedure: ESOPHAGOGASTRODUODENOSCOPY (EGD);  Surgeon: Midge Minium, MD;  Location: [facility] ENDOSCOPY;  Service: Endoscopy;  Laterality: N/A;   ESOPHAGOGASTRODUODENOSCOPY (EGD) WITH PROPOFOL N/A 06/20/2016   Procedure: ESOPHAGOGASTRODUODENOSCOPY (EGD) WITH PROPOFOL;  Surgeon: Midge Minium, MD;  Location: [facility] ENDOSCOPY;  Service: Endoscopy;  Laterality: N/A;   TONSILLECTOMY     as a child   TUMOR REMOVAL     behind ear    Home Medications:  Allergies as of 03/08/2022       Reactions   Codeine Hives   Iodinated Contrast Media Swelling   Nitrofurantoin Monohyd Macro Nausea Only        Medication List        Accurate as of March 08, 2022  9:28 AM. If you have any questions, ask your nurse or doctor.          STOP taking these medications    celecoxib 100 MG capsule Commonly known as: CELEBREX Stopped by: Vanna Scotland, MD   diclofenac Sodium 1 % Gel Commonly known as: VOLTAREN Stopped by: Vanna Scotland, MD   VITAMIN B-12 IJ Stopped by: Vanna Scotland, MD       TAKE these medications    albuterol 108 (90 Base) MCG/ACT inhaler Commonly known as: VENTOLIN HFA Inhale 2 puffs into the lungs every 6 (six) hours as needed for wheezing or shortness of breath.   cefdinir 250 MG/5ML suspension Commonly known as: OMNICEF Take 6 mLs (300 mg  total) by mouth 2 (two) times daily.   Clenpiq 10-3.5-12 MG-GM -GM/175ML Soln Generic drug: Sod Picosulfate-Mag Ox-Cit Acd Take by mouth once.   esomeprazole 40 MG capsule Commonly known as: NEXIUM TAKE 1 CAPSULE DAILY AT 12 NOON   estradiol 0.1 MG/GM vaginal cream Commonly known as: ESTRACE Estrogen Cream Instruction Discard applicator Apply pea sized amount to tip of finger to urethra before bed. Wash hands well after application. Use Monday, Wednesday and Friday   ferrous sulfate 220 (44 Fe) MG/5ML  solution Commonly known as: FeroSul Take 5 mLs (220 mg total) by mouth daily.   Flovent Diskus 100 MCG/ACT Aepb Generic drug: Fluticasone Propionate (Inhal) Inhale 1 puff into the lungs 2 (two) times daily.   fluconazole 10 MG/ML suspension Commonly known as: DIFLUCAN Take 15 mLs (150 mg total) by mouth daily. X 1 yeast infection prevention   hydrochlorothiazide 25 MG tablet Commonly known as: HYDRODIURIL TAKE 1/2 TABLET BY MOUTH EVERY DAY   losartan 25 MG tablet Commonly known as: COZAAR TAKE 1 TABLET(25 MG) BY MOUTH DAILY   Na Sulfate-K Sulfate-Mg Sulf 17.5-3.13-1.6 GM/177ML Soln See admin instructions. follow package directions   omeprazole 40 MG capsule Commonly known as: PRILOSEC TAKE 1 CAPSULE BY MOUTH EVERY DAY AT NOON   rosuvastatin 10 MG tablet Commonly known as: CRESTOR TAKE 1 TABLET BY MOUTH EVERY DAY   sertraline 25 MG tablet Commonly known as: ZOLOFT TAKE 1 TABLET BY MOUTH EVERY DAY        Allergies:  Allergies  Allergen Reactions   Codeine Hives   Iodinated Contrast Media Swelling   Nitrofurantoin Monohyd Macro Nausea Only    Family History: Family History  Problem Relation Age of Onset   Hypertension Mother    Cancer Sister        colon cancer   Hypertension Sister    Diabetes Sister    Hypertension Brother    Hypertension Maternal Aunt    Breast cancer Maternal Aunt 60   Hypertension Maternal Uncle    Stroke Maternal Grandmother    Hypertension Maternal Grandmother     Social History:  reports that she has never smoked. She has never used smokeless tobacco. She reports that she does not drink alcohol and does not use drugs.   Physical Exam: BP (!) 147/78   Pulse 99   Ht 5\' 7"  (1.702 m)   Wt 226 lb 9.6 oz (102.8 kg)   BMI 35.49 kg/m   Constitutional:  Alert and oriented, No acute distress. HEENT: [place] AT, moist mucus membranes.  Trachea midline, no masses. Cardiovascular: No clubbing, cyanosis, or edema. Respiratory: Normal  respiratory effort, no increased work of breathing. Skin: No rashes, bruises or suspicious lesions. Neurologic: Grossly intact, no focal deficits, moving all 4 extremities. Psychiatric: Normal mood and affect.  Laboratory Data: Lab Results  Component Value Date   WBC 7.1 01/23/2022   HGB 11.1 (L) 01/23/2022   HCT 33.4 (L) 01/23/2022   MCV 88.0 01/23/2022   PLT 256.0 01/23/2022    Lab Results  Component Value Date   CREATININE 1.01 12/21/2021   Lab Results  Component Value Date   HGBA1C 6.6 (H) 11/29/2021     Assessment & Plan:    1. Frequent UTI Failed more conservative management, continues to have recurrent infections and a suspicious appearing urine today  We will send culture again,  We will try Bactrim again for 7 days, she has itching she will call us and let us know.  In addition  to this, will transition to trimethoprim and for 90 days of suppression try to break this cycle.  She may also have a component of OAB, will pay attention to her urinary symptoms when she returns on suppression.  We will also plan for anatomic evaluation at this point including renal ultrasound and cystoscopy.  She is agreeable with this plan.  Continue her cranberry tablets, probiotics, topical estrogen cream, and d-mannose.  Again today, she requested a check for cure, advised not to do this for symptoms improve or resolve - Urinalysis, Complete - Bladder Scan (Post Void Residual) in office - sulfamethoxazole-trimethoprim (BACTRIM DS) 800-160 MG tablet; Take 1 tablet by mouth every 12 (twelve) hours.  Dispense: 14 tablet; Refill: 0 - trimethoprim (TRIMPEX) 100 MG tablet; Take 1 tablet (100 mg total) by mouth daily.  Dispense: 90 tablet; Refill: 0 - US RENAL; Future - CULTURE, URINE COMPREHENSIVE  Return in about 4 weeks (around 04/05/2022) for cysto.   Vanna Scotland, MD  [facility] [address], [address] [place], [place] [address] [phone]

## 2022-03-10 LAB — CULTURE, URINE COMPREHENSIVE

## 2022-03-13 ENCOUNTER — Ambulatory Visit
Admission: RE | Admit: 2022-03-13 | Discharge: 2022-03-13 | Disposition: A | Discharge status: Home / Self Care | Payer: PPO | Source: Ambulatory Visit | Attending: Urology | Admitting: Urology

## 2022-03-13 DIAGNOSIS — N390 Urinary tract infection, site not specified: Secondary | ICD-10-CM | POA: Insufficient documentation

## 2022-03-20 ENCOUNTER — Encounter: Admission: RE | Payer: Self-pay | Source: Home / Self Care

## 2022-03-20 ENCOUNTER — Ambulatory Visit: Admission: RE | Admit: 2022-03-20 | Payer: PPO | Source: Home / Self Care | Admitting: Gastroenterology

## 2022-03-20 SURGERY — IMAGING PROCEDURE, GI TRACT, INTRALUMINAL, VIA CAPSULE

## 2022-03-21 DIAGNOSIS — G4733 Obstructive sleep apnea (adult) (pediatric): Secondary | ICD-10-CM | POA: Diagnosis not present

## 2022-03-22 ENCOUNTER — Other Ambulatory Visit (INDEPENDENT_AMBULATORY_CARE_PROVIDER_SITE_OTHER): Payer: PPO

## 2022-03-22 DIAGNOSIS — E7800 Pure hypercholesterolemia, unspecified: Secondary | ICD-10-CM

## 2022-03-22 DIAGNOSIS — R739 Hyperglycemia, unspecified: Secondary | ICD-10-CM

## 2022-03-22 DIAGNOSIS — D649 Anemia, unspecified: Secondary | ICD-10-CM | POA: Diagnosis not present

## 2022-03-22 LAB — HEMOGLOBIN A1C: Hgb A1c MFr Bld: 6.6 % — ABNORMAL HIGH (ref 4.6–6.5)

## 2022-03-22 LAB — BASIC METABOLIC PANEL
BUN: 13 mg/dL (ref 6–23)
CO2: 29 mEq/L (ref 19–32)
Calcium: 9.4 mg/dL (ref 8.4–10.5)
Chloride: 106 mEq/L (ref 96–112)
Creatinine, Ser: 1.15 mg/dL (ref 0.40–1.20)
GFR: 46.43 mL/min — ABNORMAL LOW (ref >60.00)
Glucose, Bld: 116 mg/dL — ABNORMAL HIGH (ref 70–99)
Potassium: 4.2 mEq/L (ref 3.5–5.1)
Sodium: 142 mEq/L (ref 135–145)

## 2022-03-22 LAB — CBC WITH DIFFERENTIAL/PLATELET
Basophils Absolute: 0 10*3/uL (ref 0.0–0.1)
Basophils Relative: 0.3 % (ref 0.0–3.0)
Eosinophils Absolute: 0.2 10*3/uL (ref 0.0–0.7)
Eosinophils Relative: 3 % (ref 0.0–5.0)
HCT: 34.2 % — ABNORMAL LOW (ref 36.0–46.0)
Hemoglobin: 11.3 g/dL — ABNORMAL LOW (ref 12.0–15.0)
Lymphocytes Relative: 39.9 % (ref 12.0–46.0)
Lymphs Abs: 2.6 10*3/uL (ref 0.7–4.0)
MCHC: 33.1 g/dL (ref 30.0–36.0)
MCV: 86.9 fl (ref 78.0–100.0)
Monocytes Absolute: 0.5 10*3/uL (ref 0.1–1.0)
Monocytes Relative: 8.5 % (ref 3.0–12.0)
Neutro Abs: 3.1 10*3/uL (ref 1.4–7.7)
Neutrophils Relative %: 48.3 % (ref 43.0–77.0)
Platelets: 281 10*3/uL (ref 150.0–400.0)
RBC: 3.94 Mil/uL (ref 3.87–5.11)
RDW: 15.3 % (ref 11.5–15.5)
WBC: 6.4 10*3/uL (ref 4.0–10.5)

## 2022-03-22 LAB — HEPATIC FUNCTION PANEL
ALT: 14 U/L (ref 0–35)
AST: 16 U/L (ref 0–37)
Albumin: 4.1 g/dL (ref 3.5–5.2)
Alkaline Phosphatase: 69 U/L (ref 39–117)
Bilirubin, Direct: 0.1 mg/dL (ref 0.0–0.3)
Total Bilirubin: 0.4 mg/dL (ref 0.2–1.2)
Total Protein: 6.7 g/dL (ref 6.0–8.3)

## 2022-03-22 LAB — LIPID PANEL
Cholesterol: 111 mg/dL (ref 0–200)
HDL: 44.1 mg/dL (ref >39.00)
LDL Cholesterol: 50 mg/dL (ref 0–99)
NonHDL: 66.44
Total CHOL/HDL Ratio: 3
Triglycerides: 83 mg/dL (ref 0.0–149.0)
VLDL: 16.6 mg/dL (ref 0.0–40.0)

## 2022-03-22 LAB — FERRITIN: Ferritin: 7.4 ng/mL — ABNORMAL LOW (ref 10.0–291.0)

## 2022-03-22 LAB — TSH: TSH: 3.05 u[IU]/mL (ref 0.35–5.50)

## 2022-03-27 ENCOUNTER — Encounter: Payer: PPO | Admitting: Internal Medicine

## 2022-03-29 ENCOUNTER — Ambulatory Visit (INDEPENDENT_AMBULATORY_CARE_PROVIDER_SITE_OTHER): Payer: PPO | Admitting: Internal Medicine

## 2022-03-29 ENCOUNTER — Encounter: Payer: Self-pay | Admitting: Internal Medicine

## 2022-03-29 VITALS — BP 118/78 | HR 65 | Temp 98.1°F | Resp 17 | Ht 67.0 in | Wt 226.0 lb

## 2022-03-29 DIAGNOSIS — D649 Anemia, unspecified: Secondary | ICD-10-CM

## 2022-03-29 DIAGNOSIS — K219 Gastro-esophageal reflux disease without esophagitis: Secondary | ICD-10-CM

## 2022-03-29 DIAGNOSIS — I10 Essential (primary) hypertension: Secondary | ICD-10-CM | POA: Diagnosis not present

## 2022-03-29 DIAGNOSIS — R739 Hyperglycemia, unspecified: Secondary | ICD-10-CM

## 2022-03-29 DIAGNOSIS — Z0000 Encounter for general adult medical examination without abnormal findings: Secondary | ICD-10-CM

## 2022-03-29 DIAGNOSIS — N390 Urinary tract infection, site not specified: Secondary | ICD-10-CM | POA: Diagnosis not present

## 2022-03-29 DIAGNOSIS — I700 Atherosclerosis of aorta: Secondary | ICD-10-CM | POA: Diagnosis not present

## 2022-03-29 DIAGNOSIS — F320 Major depressive disorder, single episode, mild: Secondary | ICD-10-CM | POA: Diagnosis not present

## 2022-03-29 DIAGNOSIS — E7800 Pure hypercholesterolemia, unspecified: Secondary | ICD-10-CM | POA: Diagnosis not present

## 2022-03-29 NOTE — Progress Notes (Signed)
[name], female   DOB: 11-05-1945, 76 y.o.   MRN: [id number]   Subjective:    [name], female    DOB: 11-Dec-1945, 76 y.o.   MRN: [id number]   Patient here for  Chief Complaint  Patient presents with   Annual Exam    CPE   .   HPI Here for physical exam. Saw Dr Erlene Quan 03/08/22 - w/up for frequent UTIs.  Treated recently with bactrim.  Per note, planning for trimethoprim for suppression.  Also recommended renal ultrasound and cystoscopy.  Had renal ultrasound 03/13/22 - ok.  Planning for cystoscopy next week.  Had colonoscopy 02/14/22.  No chest pain or sob reported.  No abdominal pain.  Bowels moving.  Does have issues with swallowing pills.  No problems eating or drinking.  Seeing ortho for her knee.    Past Medical History:  Diagnosis Date   Allergy    Depression    History of chicken pox    HLD (hyperlipidemia)    Hx: UTI (urinary tract infection)    Hypertension    Sleep apnea    Past Surgical History:  Procedure Laterality Date   COLONOSCOPY WITH PROPOFOL N/A 06/20/2016   Procedure: COLONOSCOPY WITH PROPOFOL;  Surgeon: Lucilla Lame, MD;  Location: [facility] ENDOSCOPY;  Service: Endoscopy;  Laterality: N/A;   COLONOSCOPY WITH PROPOFOL N/A 02/14/2022   Procedure: COLONOSCOPY WITH PROPOFOL;  Surgeon: Lucilla Lame, MD;  Location: [facility] ENDOSCOPY;  Service: Endoscopy;  Laterality: N/A;   ESOPHAGOGASTRODUODENOSCOPY N/A 02/14/2022   Procedure: ESOPHAGOGASTRODUODENOSCOPY (EGD);  Surgeon: Lucilla Lame, MD;  Location: [facility] ENDOSCOPY;  Service: Endoscopy;  Laterality: N/A;   ESOPHAGOGASTRODUODENOSCOPY (EGD) WITH PROPOFOL N/A 06/20/2016   Procedure: ESOPHAGOGASTRODUODENOSCOPY (EGD) WITH PROPOFOL;  Surgeon: Lucilla Lame, MD;  Location: [facility] ENDOSCOPY;  Service: Endoscopy;  Laterality: N/A;   TONSILLECTOMY     as a child   TUMOR REMOVAL     behind ear   Family History  Problem Relation Age of Onset   Hypertension Mother    Cancer Sister        colon  cancer   Hypertension Sister    Diabetes Sister    Hypertension Brother    Hypertension Maternal Aunt    Breast cancer Maternal Aunt 49   Hypertension Maternal Uncle    Stroke Maternal Grandmother    Hypertension Maternal Grandmother    Social History   Socioeconomic History   Marital status: Divorced    Spouse name: Not on file   Number of children: Not on file   Years of education: Not on file   Highest education level: Not on file  Occupational History   Not on file  Tobacco Use   Smoking status: Never   Smokeless tobacco: Never  Vaping Use   Vaping Use: Never used  Substance and Sexual Activity   Alcohol use: No    Alcohol/week: 0.0 standard drinks of alcohol   Drug use: No   Sexual activity: Not Currently  Other Topics Concern   Not on file  Social History Narrative   Not on file   Social Determinants of Health   Financial Resource Strain: Low Risk  (05/17/2021)   Overall Financial Resource Strain (CARDIA)    Difficulty of Paying Living Expenses: Not hard at all  Food Insecurity: No Food Insecurity (05/17/2021)   Hunger Vital Sign    Worried About Running Out of Food in the Last Year: Never true    Ran Out of Food  in the Last Year: Never true  Transportation Needs: No Transportation Needs (05/17/2021)   PRAPARE - Hydrologist (Medical): No    Lack of Transportation (Non-Medical): No  Physical Activity: Unknown (05/14/2020)   Exercise Vital Sign    Days of Exercise per Week: 0 days    Minutes of Exercise per Session: Not on file  Stress: No Stress Concern Present (05/17/2021)   [place]    Feeling of Stress : Not at all  Social Connections: Unknown (05/17/2021)   Social Connection and Isolation Panel [NHANES]    Frequency of Communication with Friends and Family: More than three times a week    Frequency of Social Gatherings with Friends and Family: More than three  times a week    Attends Religious Services: Not on Advertising copywriter or Organizations: Not on file    Attends Archivist Meetings: Not on file    Marital Status: Not on file     Review of Systems  Constitutional:  Negative for appetite change and unexpected weight change.  HENT:  Negative for congestion, sinus pressure and sore throat.   Eyes:  Negative for pain and visual disturbance.  Respiratory:  Negative for cough, chest tightness and shortness of breath.   Cardiovascular:  Negative for chest pain, palpitations and leg swelling.  Gastrointestinal:  Negative for abdominal pain, diarrhea, nausea and vomiting.  Genitourinary:  Negative for difficulty urinating and dysuria.  Musculoskeletal:  Negative for joint swelling and myalgias.  Skin:  Negative for color change and rash.  Neurological:  Negative for dizziness and headaches.  Hematological:  Negative for adenopathy. Does not bruise/bleed easily.  Psychiatric/Behavioral:  Negative for agitation and dysphoric mood.        Objective:     BP 118/78   Pulse 65   Temp 98.1 F (36.7 C) (Temporal)   Resp 17   Ht 5' 7" (1.702 m)   Wt 226 lb (102.5 kg)   SpO2 97%   BMI 35.40 kg/m  Wt Readings from Last 3 Encounters:  03/29/22 226 lb (102.5 kg)  03/08/22 226 lb 9.6 oz (102.8 kg)  02/14/22 229 lb (103.9 kg)    Physical Exam Vitals reviewed.  Constitutional:      General: She is not in acute distress.    Appearance: Normal appearance. She is well-developed.  HENT:     Head: Normocephalic and atraumatic.     Right Ear: External ear normal.     Left Ear: External ear normal.  Eyes:     General: No scleral icterus.       Right eye: No discharge.        Left eye: No discharge.     Conjunctiva/sclera: Conjunctivae normal.  Neck:     Thyroid: No thyromegaly.  Cardiovascular:     Rate and Rhythm: Normal rate and regular rhythm.  Pulmonary:     Effort: No tachypnea, accessory muscle usage or  respiratory distress.     Breath sounds: Normal breath sounds. No decreased breath sounds or wheezing.  Chest:  Breasts:    Right: No inverted nipple, mass, nipple discharge or tenderness (no axillary adenopathy).     Left: No inverted nipple, mass, nipple discharge or tenderness (no axilarry adenopathy).  Abdominal:     General: Bowel sounds are normal.     Palpations: Abdomen is soft.     Tenderness: There is no  abdominal tenderness.  Musculoskeletal:        General: No swelling or tenderness.     Cervical back: Neck supple.  Lymphadenopathy:     Cervical: No cervical adenopathy.  Skin:    Findings: No erythema or rash.  Neurological:     Mental Status: She is alert and oriented to person, place, and time.  Psychiatric:        Mood and Affect: Mood normal.        Behavior: Behavior normal.      Outpatient Encounter Medications as of 03/29/2022  Medication Sig   albuterol (VENTOLIN HFA) 108 (90 Base) MCG/ACT inhaler Inhale 2 puffs into the lungs every 6 (six) hours as needed for wheezing or shortness of breath.   cefdinir (OMNICEF) 250 MG/5ML suspension Take 6 mLs (300 mg total) by mouth 2 (two) times daily.   CLENPIQ 10-3.5-12 MG-GM -GM/175ML SOLN Take by mouth once.   esomeprazole (NEXIUM) 40 MG capsule TAKE 1 CAPSULE DAILY AT 12 NOON   estradiol (ESTRACE) 0.1 MG/GM vaginal cream Estrogen Cream Instruction Discard applicator Apply pea sized amount to tip of finger to urethra before bed. Wash hands well after application. Use Monday, Wednesday and Friday   ferrous sulfate (FEROSUL) 220 (44 Fe) MG/5ML solution Take 5 mLs (220 mg total) by mouth daily.   fluconazole (DIFLUCAN) 10 MG/ML suspension Take 15 mLs (150 mg total) by mouth daily. X 1 yeast infection prevention   Fluticasone Propionate, Inhal, (FLOVENT DISKUS) 100 MCG/ACT AEPB Inhale 1 puff into the lungs 2 (two) times daily.   hydrochlorothiazide (HYDRODIURIL) 25 MG tablet TAKE 1/2 TABLET BY MOUTH EVERY DAY   losartan  (COZAAR) 25 MG tablet TAKE 1 TABLET(25 MG) BY MOUTH DAILY   Na Sulfate-K Sulfate-Mg Sulf 17.5-3.13-1.6 GM/177ML SOLN See admin instructions. follow package directions   rosuvastatin (CRESTOR) 10 MG tablet TAKE 1 TABLET BY MOUTH EVERY DAY   sertraline (ZOLOFT) 25 MG tablet TAKE 1 TABLET BY MOUTH EVERY DAY   sulfamethoxazole-trimethoprim (BACTRIM DS) 800-160 MG tablet Take 1 tablet by mouth every 12 (twelve) hours.   trimethoprim (TRIMPEX) 100 MG tablet Take 1 tablet (100 mg total) by mouth daily.   [DISCONTINUED] omeprazole (PRILOSEC) 40 MG capsule TAKE 1 CAPSULE BY MOUTH EVERY DAY AT NOON (Patient not taking: Reported on 03/29/2022)   No facility-administered encounter medications on file as of 03/29/2022.     Lab Results  Component Value Date   WBC 6.4 03/22/2022   HGB 11.3 (L) 03/22/2022   HCT 34.2 (L) 03/22/2022   PLT 281.0 03/22/2022   GLUCOSE 116 (H) 03/22/2022   CHOL 111 03/22/2022   TRIG 83.0 03/22/2022   HDL 44.10 03/22/2022   LDLCALC 50 03/22/2022   ALT 14 03/22/2022   AST 16 03/22/2022   NA 142 03/22/2022   K 4.2 03/22/2022   CL 106 03/22/2022   CREATININE 1.15 03/22/2022   BUN 13 03/22/2022   CO2 29 03/22/2022   TSH 3.05 03/22/2022   HGBA1C 6.6 (H) 03/22/2022    US RENAL  Result Date: 03/16/2022 CLINICAL DATA:  Recurrent UTI EXAM: RENAL / URINARY TRACT ULTRASOUND COMPLETE COMPARISON:  03/24/2015 FINDINGS: Right Kidney: Renal measurements: 10.6 x 5 x 4 cm = volume: 110 mL. Echogenicity within normal limits. No mass or hydronephrosis visualized. Left Kidney: Renal measurements: 10 x 5 x 5 cm = volume: 130 mL. Echogenicity within normal limits. No mass or hydronephrosis visualized. A few small and simple pelvic cysts are noted. Bladder: Appears normal for degree  of bladder distention. IMPRESSION: No hydronephrosis, collection, or renal scarring. Electronically Signed   By: Jorje Guild M.D.   On: 03/16/2022 07:33       Assessment & Plan:   Problem List Items  Addressed This Visit     Anemia - Primary    Saw GI.  S/p EGD and colonoscopy.  She canceled capsule study and desires not to pursue further testing at this time.   Concerned about having to swallow.  Follow cbc and iron studies.       Relevant Orders   CBC with Differential/Platelet   Ferritin   Aortic atherosclerosis ([place])    Continue crestor.       Essential hypertension, benign    On losartan and hctz.  Continue current medication regimen.  Follow pressures.        Relevant Orders   Basic metabolic panel   Frequent UTI    Seeing Dr Erlene Quan as outlined.  S/p renal ultrasound - ok. Planning for cystoscopy next week.  Completed bactrim.  Per note, planning for trimethoprim suppression.       GERD (gastroesophageal reflux disease)    Nexium.       Health care maintenance    Physical today 03/29/22.  Mammogram 12/23/21- Birads I.   Colonoscopy 02/14/22       Hypercholesteremia    Continue crestor.  Low cholesterol diet and exercise.  Follow lipid panel and liver function tests.       Hyperglycemia    Low carb diet and exercise.  Follow met b and a1c.       Major depressive disorder, single episode, mild (HCC)    Continue zoloft.  Follow.         Einar Pheasant, MD

## 2022-03-29 NOTE — Assessment & Plan Note (Signed)
Physical today 03/29/22.  Mammogram 12/23/21- Birads I.   Colonoscopy 02/14/22

## 2022-03-31 ENCOUNTER — Encounter: Payer: Self-pay | Admitting: Internal Medicine

## 2022-03-31 NOTE — Assessment & Plan Note (Signed)
Seeing Dr Apolinar Junes as outlined.  S/p renal ultrasound - ok. Planning for cystoscopy next week.  Completed bactrim.  Per note, planning for trimethoprim suppression.

## 2022-03-31 NOTE — Assessment & Plan Note (Signed)
Continue crestor.  Low cholesterol diet and exercise. Follow lipid panel and liver function tests.   

## 2022-03-31 NOTE — Assessment & Plan Note (Signed)
Low carb diet and exercise.  Follow met b and a1c.

## 2022-03-31 NOTE — Assessment & Plan Note (Signed)
On losartan and hctz.  Continue current medication regimen.  Follow pressures.

## 2022-03-31 NOTE — Assessment & Plan Note (Signed)
Saw GI.  S/p EGD and colonoscopy.  She canceled capsule study and desires not to pursue further testing at this time.   Concerned about having to swallow.  Follow cbc and iron studies.

## 2022-03-31 NOTE — Assessment & Plan Note (Signed)
Continue zoloft.  Follow.   

## 2022-03-31 NOTE — Assessment & Plan Note (Signed)
Continue crestor 

## 2022-03-31 NOTE — Assessment & Plan Note (Signed)
Nexium 

## 2022-04-05 ENCOUNTER — Ambulatory Visit: Payer: PPO | Admitting: Urology

## 2022-04-05 ENCOUNTER — Encounter: Payer: Self-pay | Admitting: Urology

## 2022-04-05 VITALS — BP 130/75 | HR 81 | Ht 67.0 in | Wt 226.0 lb

## 2022-04-05 DIAGNOSIS — Z87448 Personal history of other diseases of urinary system: Secondary | ICD-10-CM | POA: Diagnosis not present

## 2022-04-05 DIAGNOSIS — N390 Urinary tract infection, site not specified: Secondary | ICD-10-CM | POA: Diagnosis not present

## 2022-04-05 DIAGNOSIS — Z87440 Personal history of urinary (tract) infections: Secondary | ICD-10-CM

## 2022-04-05 LAB — MICROSCOPIC EXAMINATION: Bacteria, UA: NONE SEEN

## 2022-04-05 LAB — URINALYSIS, COMPLETE
Bilirubin, UA: NEGATIVE
Glucose, UA: NEGATIVE
Ketones, UA: NEGATIVE
Nitrite, UA: NEGATIVE
Protein,UA: NEGATIVE
RBC, UA: NEGATIVE
Specific Gravity, UA: 1.005 (ref 1.005–1.030)
Urobilinogen, Ur: 0.2 mg/dL (ref 0.2–1.0)
pH, UA: 6 (ref 5.0–7.5)

## 2022-04-05 NOTE — Progress Notes (Signed)
   04/05/22  CC:  Chief Complaint  Patient presents with   Cysto    HPI: This 76 year old female with a personal history of urethral caruncle, recurrent UTIs who presents today for follow-up.  She is currently on trimethoprim for suppression.  She reports that her urinary symptoms have stabilized and she seems to be doing well on this.  She is hopeful this will be effective moving forward.  Renal bladder ultrasound was unremarkable.  Blood pressure 130/75, pulse 81, height 5\' 7"  (1.702 m), weight 226 lb (102.5 kg). NED. A&Ox3.   No respiratory distress   Abd soft, NT, ND Normal external genitalia with patent urethral meatus.  Stable benign urethral caruncle.  Cystoscopy Procedure Note  Patient identification was confirmed, informed consent was obtained, and patient was prepped using Betadine solution.  Lidocaine jelly was administered per urethral meatus.    Procedure: - Flexible cystoscope introduced, without any difficulty.   - Thorough search of the bladder revealed:    normal urethral meatus    normal urothelium    no stones    no ulcers     no tumors    no urethral polyps    no trabeculation  - Ureteral orifices were normal in position and appearance.  Post-Procedure: - Patient tolerated the procedure well  Assessment/ Plan:  1. Frequent UTI Anatomic evaluation is unremarkable, noncontributory  Continue suppression for total of 90 days, will reassess at that point  Continue other prophylactic measures as previously outlined - Urinalysis, Complete   F/u prn  , MD

## 2022-04-20 DIAGNOSIS — G4733 Obstructive sleep apnea (adult) (pediatric): Secondary | ICD-10-CM | POA: Diagnosis not present

## 2022-05-12 ENCOUNTER — Other Ambulatory Visit: Payer: Self-pay | Admitting: Family

## 2022-05-17 ENCOUNTER — Telehealth: Payer: Self-pay | Admitting: Internal Medicine

## 2022-05-17 DIAGNOSIS — R198 Other specified symptoms and signs involving the digestive system and abdomen: Secondary | ICD-10-CM

## 2022-05-17 NOTE — Telephone Encounter (Signed)
Called and talked to Susan Miller regarding referral to speech therapy regarding her swallowing issues - difficulty swallowing pills.  She is interested in referral now.  She has had increased dental work and feels some of her swallowing issues are related to this.  Message left for ST to discuss referral.  Order placed for referral.

## 2022-05-19 ENCOUNTER — Other Ambulatory Visit: Payer: Self-pay | Admitting: Internal Medicine

## 2022-05-23 ENCOUNTER — Encounter: Payer: Self-pay | Admitting: Pulmonary Disease

## 2022-05-24 MED ORDER — ARNUITY ELLIPTA 100 MCG/ACT IN AEPB: 1.0000 | INHALATION_SPRAY | Freq: Every day | RESPIRATORY_TRACT | 1 refills | Status: DC

## 2022-05-24 NOTE — Telephone Encounter (Signed)
We can try Arnuity Ellipta 100, 1 puff daily.

## 2022-05-24 NOTE — Telephone Encounter (Signed)
Dr. Gonzalez, please advise. Thanks 

## 2022-05-25 ENCOUNTER — Other Ambulatory Visit: Payer: PPO

## 2022-05-26 ENCOUNTER — Other Ambulatory Visit: Payer: Self-pay | Admitting: Internal Medicine

## 2022-05-30 ENCOUNTER — Other Ambulatory Visit (INDEPENDENT_AMBULATORY_CARE_PROVIDER_SITE_OTHER): Payer: PPO

## 2022-05-30 ENCOUNTER — Ambulatory Visit: Payer: PPO | Admitting: Speech Pathology

## 2022-05-30 DIAGNOSIS — I10 Essential (primary) hypertension: Secondary | ICD-10-CM | POA: Diagnosis not present

## 2022-05-30 DIAGNOSIS — D649 Anemia, unspecified: Secondary | ICD-10-CM | POA: Diagnosis not present

## 2022-05-30 LAB — CBC WITH DIFFERENTIAL/PLATELET
Basophils Absolute: 0.1 10*3/uL (ref 0.0–0.1)
Basophils Relative: 0.8 % (ref 0.0–3.0)
Eosinophils Absolute: 0.2 10*3/uL (ref 0.0–0.7)
Eosinophils Relative: 2.3 % (ref 0.0–5.0)
HCT: 33.8 % — ABNORMAL LOW (ref 36.0–46.0)
Hemoglobin: 11.3 g/dL — ABNORMAL LOW (ref 12.0–15.0)
Lymphocytes Relative: 38.5 % (ref 12.0–46.0)
Lymphs Abs: 2.9 10*3/uL (ref 0.7–4.0)
MCHC: 33.3 g/dL (ref 30.0–36.0)
MCV: 88.3 fl (ref 78.0–100.0)
Monocytes Absolute: 0.7 10*3/uL (ref 0.1–1.0)
Monocytes Relative: 8.9 % (ref 3.0–12.0)
Neutro Abs: 3.8 10*3/uL (ref 1.4–7.7)
Neutrophils Relative %: 49.5 % (ref 43.0–77.0)
Platelets: 265 10*3/uL (ref 150.0–400.0)
RBC: 3.83 Mil/uL — ABNORMAL LOW (ref 3.87–5.11)
RDW: 15 % (ref 11.5–15.5)
WBC: 7.6 10*3/uL (ref 4.0–10.5)

## 2022-05-30 LAB — BASIC METABOLIC PANEL
BUN: 17 mg/dL (ref 6–23)
CO2: 29 mEq/L (ref 19–32)
Calcium: 9.7 mg/dL (ref 8.4–10.5)
Chloride: 104 mEq/L (ref 96–112)
Creatinine, Ser: 1.11 mg/dL (ref 0.40–1.20)
GFR: 48.38 mL/min — ABNORMAL LOW (ref >60.00)
Glucose, Bld: 107 mg/dL — ABNORMAL HIGH (ref 70–99)
Potassium: 4.1 mEq/L (ref 3.5–5.1)
Sodium: 141 mEq/L (ref 135–145)

## 2022-05-30 LAB — FERRITIN: Ferritin: 8 ng/mL — ABNORMAL LOW (ref 10.0–291.0)

## 2022-05-31 ENCOUNTER — Other Ambulatory Visit: Payer: Self-pay

## 2022-05-31 DIAGNOSIS — D649 Anemia, unspecified: Secondary | ICD-10-CM

## 2022-06-02 ENCOUNTER — Telehealth: Payer: Self-pay | Admitting: Internal Medicine

## 2022-06-02 NOTE — Telephone Encounter (Signed)
Pt need a refill on omeprazole sent to [facility] 

## 2022-06-02 NOTE — Telephone Encounter (Signed)
sent 

## 2022-06-05 ENCOUNTER — Encounter: Payer: Self-pay | Admitting: Oncology

## 2022-06-05 ENCOUNTER — Inpatient Hospital Stay: Payer: PPO

## 2022-06-05 ENCOUNTER — Encounter: Payer: Self-pay | Admitting: Internal Medicine

## 2022-06-05 ENCOUNTER — Inpatient Hospital Stay: Payer: PPO | Attending: Oncology | Admitting: Oncology

## 2022-06-05 VITALS — BP 147/81 | HR 85 | Temp 97.8°F | Resp 20 | Wt 227.5 lb

## 2022-06-05 DIAGNOSIS — Z800 Family history of malignant neoplasm of digestive organs: Secondary | ICD-10-CM | POA: Diagnosis not present

## 2022-06-05 DIAGNOSIS — Z79899 Other long term (current) drug therapy: Secondary | ICD-10-CM | POA: Insufficient documentation

## 2022-06-05 DIAGNOSIS — D649 Anemia, unspecified: Secondary | ICD-10-CM | POA: Insufficient documentation

## 2022-06-05 DIAGNOSIS — Z803 Family history of malignant neoplasm of breast: Secondary | ICD-10-CM | POA: Diagnosis not present

## 2022-06-05 LAB — CBC
HCT: 34.6 % — ABNORMAL LOW (ref 36.0–46.0)
Hemoglobin: 11.2 g/dL — ABNORMAL LOW (ref 12.0–15.0)
MCH: 29 pg (ref 26.0–34.0)
MCHC: 32.4 g/dL (ref 30.0–36.0)
MCV: 89.6 fL (ref 80.0–100.0)
Platelets: 269 10*3/uL (ref 150–400)
RBC: 3.86 MIL/uL — ABNORMAL LOW (ref 3.87–5.11)
RDW: 14.5 % (ref 11.5–15.5)
WBC: 6.7 10*3/uL (ref 4.0–10.5)
nRBC: 0 % (ref 0.0–0.2)

## 2022-06-05 LAB — FOLATE: Folate: 13.5 ng/mL (ref >5.9)

## 2022-06-05 LAB — IRON AND TIBC
Iron: 56 ug/dL (ref 28–170)
Saturation Ratios: 14 % (ref 10.4–31.8)
TIBC: 405 ug/dL (ref 250–450)
UIBC: 349 ug/dL

## 2022-06-05 LAB — FERRITIN: Ferritin: 7 ng/mL — ABNORMAL LOW (ref 11–307)

## 2022-06-05 LAB — VITAMIN B12: Vitamin B-12: 282 pg/mL (ref 180–914)

## 2022-06-05 LAB — DAT, POLYSPECIFIC AHG (ARMC ONLY): Polyspecific AHG test: NEGATIVE

## 2022-06-05 LAB — LACTATE DEHYDROGENASE: LDH: 128 U/L (ref 98–192)

## 2022-06-05 NOTE — Progress Notes (Signed)
[place]  [phone] Fax:([phone]  ID: Susan Miller OB: 1946-01-14  MR#: [id number]  IEP#:[id number]  Patient Care Team: Einar Pheasant, MD as PCP - General (Internal Medicine) Kate Sable, MD as PCP - Cardiology (Cardiology)  CHIEF COMPLAINT: Anemia, unspecified  INTERVAL HISTORY: Patient is a 77 year old female who was noted to have a declining hemoglobin on routine labs.  She also had a decreased ferritin level suggesting iron deficiency anemia.  She currently feels well and is asymptomatic.  She has no neurologic complaints.  She denies any recent fevers or illnesses.  She has good appetite and denies weight loss.  She has no chest pain, shortness of breath, cough, or hemoptysis.  She denies any nausea, vomiting, constipation, or diarrhea.  She has no melena or hematochezia.  She has no urinary complaints.  Patient feels at her baseline and offers no specific complaints today.  REVIEW OF SYSTEMS:   Review of Systems  Constitutional: Negative.  Negative for fever, malaise/fatigue and weight loss.  Respiratory: Negative.  Negative for cough, hemoptysis and shortness of breath.   Cardiovascular: Negative.  Negative for chest pain and leg swelling.  Gastrointestinal: Negative.  Negative for abdominal pain, blood in stool and melena.  Genitourinary: Negative.  Negative for hematuria.  Musculoskeletal: Negative.  Negative for back pain.  Skin: Negative.  Negative for rash.  Neurological: Negative.  Negative for dizziness, focal weakness, weakness and headaches.  Psychiatric/Behavioral: Negative.  The patient is not nervous/anxious.     As per HPI. Otherwise, a complete review of systems is negative.  PAST MEDICAL HISTORY: Past Medical History:  Diagnosis Date   Allergy    Depression    History of chicken pox    HLD (hyperlipidemia)    Hx: UTI (urinary tract infection)    Hypertension    Sleep apnea     PAST SURGICAL  HISTORY: Past Surgical History:  Procedure Laterality Date   COLONOSCOPY WITH PROPOFOL N/A 06/20/2016   Procedure: COLONOSCOPY WITH PROPOFOL;  Surgeon: Lucilla Lame, MD;  Location: [facility] ENDOSCOPY;  Service: Endoscopy;  Laterality: N/A;   COLONOSCOPY WITH PROPOFOL N/A 02/14/2022   Procedure: COLONOSCOPY WITH PROPOFOL;  Surgeon: Lucilla Lame, MD;  Location: [facility] ENDOSCOPY;  Service: Endoscopy;  Laterality: N/A;   ESOPHAGOGASTRODUODENOSCOPY N/A 02/14/2022   Procedure: ESOPHAGOGASTRODUODENOSCOPY (EGD);  Surgeon: Lucilla Lame, MD;  Location[facility] ENDOSCOPY;  Service: Endoscopy;  Laterality: N/A;   ESOPHAGOGASTRODUODENOSCOPY (EGD) WITH PROPOFOL N/A 06/20/2016   Procedure: ESOPHAGOGASTRODUODENOSCOPY (EGD) WITH PROPOFOL;  Surgeon: Lucilla Lame, MD;  Location: [facility] ENDOSCOPY;  Service: Endoscopy;  Laterality: N/A;   TONSILLECTOMY     as a child   TUMOR REMOVAL     behind ear    FAMILY HISTORY: Family History  Problem Relation Age of Onset   Hypertension Mother    Cancer Sister        colon cancer   Hypertension Sister    Diabetes Sister    Hypertension Brother    Hypertension Maternal Aunt    Breast cancer Maternal Aunt 60   Hypertension Maternal Uncle    Stroke Maternal Grandmother    Hypertension Maternal Grandmother     ADVANCED DIRECTIVES (Y/N):  N  HEALTH MAINTENANCE: Social History   Tobacco Use   Smoking status: Never   Smokeless tobacco: Never  Vaping Use   Vaping Use: Never used  Substance Use Topics   Alcohol use: No    Alcohol/week: 0.0 standard drinks of alcohol   Drug use: No  Colonoscopy:  PAP:  Bone density:  Lipid panel:  Allergies  Allergen Reactions   Codeine Hives   Iodinated Contrast Media Swelling   Nitrofurantoin Monohyd Macro Nausea Only    Current Outpatient Medications  Medication Sig Dispense Refill   albuterol (VENTOLIN HFA) 108 (90 Base) MCG/ACT inhaler Inhale 2 puffs into the lungs every 6 (six) hours as needed for wheezing or  shortness of breath. 8 g 2   CLENPIQ 10-3.5-12 MG-GM -GM/175ML SOLN Take by mouth once.     esomeprazole (NEXIUM) 40 MG capsule TAKE 1 CAPSULE DAILY AT 12 NOON 90 capsule 0   estradiol (ESTRACE) 0.1 MG/GM vaginal cream Estrogen Cream Instruction Discard applicator Apply pea sized amount to tip of finger to urethra before bed. Wash hands well after application. Use Monday, Wednesday and Friday 42.5 g 12   fluconazole (DIFLUCAN) 10 MG/ML suspension Take 15 mLs (150 mg total) by mouth daily. X 1 yeast infection prevention 35 mL 0   Fluticasone Furoate (ARNUITY ELLIPTA) 100 MCG/ACT AEPB Inhale 1 puff into the lungs daily. 30 each 1   hydrochlorothiazide (HYDRODIURIL) 25 MG tablet TAKE 1/2 TABLET BY MOUTH EVERY DAY 45 tablet 1   losartan (COZAAR) 25 MG tablet TAKE 1 TABLET(25 MG) BY MOUTH DAILY 90 tablet 0   rosuvastatin (CRESTOR) 10 MG tablet TAKE 1 TABLET BY MOUTH EVERY DAY 90 tablet 0   sertraline (ZOLOFT) 25 MG tablet TAKE 1 TABLET BY MOUTH EVERY DAY 90 tablet 1   sulfamethoxazole-trimethoprim (BACTRIM DS) 800-160 MG tablet Take 1 tablet by mouth every 12 (twelve) hours. 14 tablet 0   trimethoprim (TRIMPEX) 100 MG tablet Take 1 tablet (100 mg total) by mouth daily. 90 tablet 0   cefdinir (OMNICEF) 250 MG/5ML suspension Take 6 mLs (300 mg total) by mouth 2 (two) times daily. (Patient not taking: Reported on 06/05/2022) 60 mL 0   ferrous sulfate (FEROSUL) 220 (44 Fe) MG/5ML solution Take 5 mLs (220 mg total) by mouth daily. (Patient not taking: Reported on 06/05/2022) 150 mL 3   Na Sulfate-K Sulfate-Mg Sulf 17.5-3.13-1.6 GM/177ML SOLN See admin instructions. follow package directions (Patient not taking: Reported on 06/05/2022)     No current facility-administered medications for this visit.    OBJECTIVE: Vitals:   06/05/22 1031  BP: (!) 147/81  Pulse: 85  Resp: 20  Temp: 97.8 F (36.6 C)  SpO2: 100%     Body mass index is 35.63 kg/m.    ECOG FS:0 - Asymptomatic  General: Well-developed,  well-nourished, no acute distress. Eyes: Pink conjunctiva, anicteric sclera. HEENT: Normocephalic, moist mucous membranes. Lungs: No audible wheezing or coughing. Heart: Regular rate and rhythm. Abdomen: Soft, nontender, no obvious distention. Musculoskeletal: No edema, cyanosis, or clubbing. Neuro: Alert, answering all questions appropriately. Cranial nerves grossly intact. Skin: No rashes or petechiae noted. Psych: Normal affect. Lymphatics: No cervical, calvicular, axillary or inguinal LAD.   LAB RESULTS:  Lab Results  Component Value Date   NA 141 05/30/2022   K 4.1 05/30/2022   CL 104 05/30/2022   CO2 29 05/30/2022   GLUCOSE 107 (H) 05/30/2022   BUN 17 05/30/2022   CREATININE 1.11 05/30/2022   CALCIUM 9.7 05/30/2022   PROT 6.7 03/22/2022   ALBUMIN 4.1 03/22/2022   AST 16 03/22/2022   ALT 14 03/22/2022   ALKPHOS 69 03/22/2022   BILITOT 0.4 03/22/2022   GFRNONAA 50 (L) 06/12/2019   GFRAA 58 (L) 06/12/2019    Lab Results  Component Value Date   WBC 6.7  06/05/2022   NEUTROABS 3.8 05/30/2022   HGB 11.2 (L) 06/05/2022   HCT 34.6 (L) 06/05/2022   MCV 89.6 06/05/2022   PLT 269 06/05/2022   Lab Results  Component Value Date   IRON 43 01/23/2022   TIBC 355.6 01/23/2022   IRONPCTSAT 12.1 (L) 01/23/2022   Lab Results  Component Value Date   FERRITIN 8.0 (L) 05/30/2022     STUDIES: No results found.  ASSESSMENT: Anemia, unspecified.  PLAN:    Anemia, unspecified: Suspect iron deficiency anemia with slowly declining hemoglobin and decreased ferritin.  The remainder of her iron panel as well as all of her other laboratory work is pending at time of dictation.  Colonoscopy and EGD in October 2023 did not reveal any significant pathology.  Patient could not tolerate video endoscopy.  Will proceed with 3 doses of 200 mg IV Venofer over the next 1 to 2 weeks.  Patient then return to clinic in 3 months with repeat laboratory work, further evaluation, and continuation  of treatment if needed.  Patient expressed understanding and was in agreement with this plan. She also understands that She can call clinic at any time with any questions, concerns, or complaints.    Jeralyn Ruths, MD   06/05/2022 12:43 PM

## 2022-06-05 NOTE — Addendum Note (Signed)
Addended by: Philipp Deputy on: 06/05/2022 03:16 PM   Modules accepted: Orders

## 2022-06-05 NOTE — Progress Notes (Signed)
Patient has no concerns 

## 2022-06-06 ENCOUNTER — Other Ambulatory Visit: Payer: Self-pay

## 2022-06-06 ENCOUNTER — Ambulatory Visit: Payer: PPO | Attending: Internal Medicine | Admitting: Speech Pathology

## 2022-06-06 DIAGNOSIS — R1312 Dysphagia, oropharyngeal phase: Secondary | ICD-10-CM | POA: Diagnosis not present

## 2022-06-06 LAB — HAPTOGLOBIN: Haptoglobin: 256 mg/dL (ref 42–346)

## 2022-06-06 MED ORDER — LOSARTAN POTASSIUM 25 MG PO TABS: ORAL_TABLET | ORAL | 0 refills | Status: DC

## 2022-06-06 MED ORDER — ROSUVASTATIN CALCIUM 10 MG PO TABS: 10.0000 mg | ORAL_TABLET | Freq: Every day | ORAL | 0 refills | Status: DC

## 2022-06-06 MED ORDER — ESOMEPRAZOLE MAGNESIUM 40 MG PO CPDR: DELAYED_RELEASE_CAPSULE | ORAL | 0 refills | Status: DC
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
## 2022-06-08 ENCOUNTER — Inpatient Hospital Stay: Payer: PPO | Attending: Oncology

## 2022-06-08 VITALS — BP 127/86 | HR 78 | Temp 97.8°F | Resp 18

## 2022-06-08 DIAGNOSIS — D649 Anemia, unspecified: Secondary | ICD-10-CM

## 2022-06-08 DIAGNOSIS — D6489 Other specified anemias: Secondary | ICD-10-CM | POA: Insufficient documentation

## 2022-06-08 DIAGNOSIS — E611 Iron deficiency: Secondary | ICD-10-CM | POA: Diagnosis not present

## 2022-06-08 LAB — PROTEIN ELECTROPHORESIS, SERUM
A/G Ratio: 1.3 (ref 0.7–1.7)
Albumin ELP: 3.8 g/dL (ref 2.9–4.4)
Alpha-1-Globulin: 0.3 g/dL (ref 0.0–0.4)
Alpha-2-Globulin: 0.8 g/dL (ref 0.4–1.0)
Beta Globulin: 0.9 g/dL (ref 0.7–1.3)
Gamma Globulin: 1 g/dL (ref 0.4–1.8)
Globulin, Total: 3 g/dL (ref 2.2–3.9)
Total Protein ELP: 6.8 g/dL (ref 6.0–8.5)

## 2022-06-08 MED ORDER — SODIUM CHLORIDE 0.9% FLUSH
10.0000 mL | Freq: Once | INTRAVENOUS | Status: AC | PRN
  Administered 2022-06-08: 10 mL
  Filled 2022-06-08: qty 10

## 2022-06-08 MED ORDER — SODIUM CHLORIDE 0.9 % IV SOLN
Freq: Once | INTRAVENOUS | Status: AC
  Filled 2022-06-08: qty 250

## 2022-06-08 MED ORDER — SODIUM CHLORIDE 0.9 % IV SOLN
200.0000 mg | Freq: Once | INTRAVENOUS | Status: AC
  Administered 2022-06-08: 200 mg via INTRAVENOUS
  Filled 2022-06-08: qty 200

## 2022-06-08 NOTE — Progress Notes (Signed)
Patient tolerated initial Venofer infusion well, no questions/concerns voiced. Monitored 30 min post transfusion. Patient stable at discharge. VSS. AVS given.    

## 2022-06-08 NOTE — Patient Instructions (Signed)
Iron Sucrose Injection What is this medication? IRON SUCROSE (EYE ern SOO krose) treats low levels of iron (iron deficiency anemia) in people with kidney disease. Iron is a mineral that plays an important role in making red blood cells, which carry oxygen from your lungs to the rest of your body. This medicine may be used for other purposes; ask your health care provider or pharmacist if you have questions. COMMON BRAND NAME(S): Venofer What should I tell my care team before I take this medication? They need to know if you have any of these conditions: Anemia not caused by low iron levels Heart disease High levels of iron in the blood Kidney disease Liver disease An unusual or allergic reaction to iron, other medications, foods, dyes, or preservatives Pregnant or trying to get pregnant Breastfeeding How should I use this medication? This medication is for infusion into a vein. It is given in a hospital or clinic setting. Talk to your care team about the use of this medication in children. While this medication may be prescribed for children as young as 2 years for selected conditions, precautions do apply. Overdosage: If you think you have taken too much of this medicine contact a poison control center or emergency room at once. NOTE: This medicine is only for you. Do not share this medicine with others. What if I miss a dose? Keep appointments for follow-up doses. It is important not to miss your dose. Call your care team if you are unable to keep an appointment. What may interact with this medication? Do not take this medication with any of the following: Deferoxamine Dimercaprol Other iron products This medication may also interact with the following: Chloramphenicol Deferasirox This list may not describe all possible interactions. Give your health care provider a list of all the medicines, herbs, non-prescription drugs, or dietary supplements you use. Also tell them if you smoke,  drink alcohol, or use illegal drugs. Some items may interact with your medicine. What should I watch for while using this medication? Visit your care team regularly. Tell your care team if your symptoms do not start to get better or if they get worse. You may need blood work done while you are taking this medication. You may need to follow a special diet. Talk to your care team. Foods that contain iron include: whole grains/cereals, dried fruits, beans, or peas, leafy green vegetables, and organ meats (liver, kidney). What side effects may I notice from receiving this medication? Side effects that you should report to your care team as soon as possible: Allergic reactions--skin rash, itching, hives, swelling of the face, lips, tongue, or throat Low blood pressure--dizziness, feeling faint or lightheaded, blurry vision Shortness of breath Side effects that usually do not require medical attention (report to your care team if they continue or are bothersome): Flushing Headache Joint pain Muscle pain Nausea Pain, redness, or irritation at injection site This list may not describe all possible side effects. Call your doctor for medical advice about side effects. You may report side effects to FDA at 1-800-FDA-1088. Where should I keep my medication? This medication is given in a hospital or clinic and will not be stored at home. NOTE: This sheet is a summary. It may not cover all possible information. If you have questions about this medicine, talk to your doctor, pharmacist, or health care provider.  2023 Elsevier/Gold Standard (2020-08-05 00:00:00)

## 2022-06-09 ENCOUNTER — Ambulatory Visit: Payer: PPO | Admitting: Speech Pathology

## 2022-06-13 ENCOUNTER — Ambulatory Visit: Payer: PPO | Attending: Internal Medicine | Admitting: Speech Pathology

## 2022-06-13 DIAGNOSIS — R1313 Dysphagia, pharyngeal phase: Secondary | ICD-10-CM | POA: Insufficient documentation

## 2022-06-13 DIAGNOSIS — R1312 Dysphagia, oropharyngeal phase: Secondary | ICD-10-CM | POA: Diagnosis not present

## 2022-06-13 DIAGNOSIS — R490 Dysphonia: Secondary | ICD-10-CM | POA: Insufficient documentation
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
## 2022-06-14 ENCOUNTER — Ambulatory Visit: Payer: PPO

## 2022-06-15 ENCOUNTER — Inpatient Hospital Stay: Payer: PPO

## 2022-06-15 VITALS — BP 115/87 | HR 84 | Temp 98.5°F

## 2022-06-15 DIAGNOSIS — D649 Anemia, unspecified: Secondary | ICD-10-CM

## 2022-06-15 DIAGNOSIS — E611 Iron deficiency: Secondary | ICD-10-CM | POA: Diagnosis not present

## 2022-06-15 MED ORDER — SODIUM CHLORIDE 0.9 % IV SOLN
Freq: Once | INTRAVENOUS | Status: AC
  Filled 2022-06-15: qty 250

## 2022-06-15 MED ORDER — SODIUM CHLORIDE 0.9 % IV SOLN
200.0000 mg | Freq: Once | INTRAVENOUS | Status: AC
  Administered 2022-06-15: 200 mg via INTRAVENOUS
  Filled 2022-06-15: qty 200

## 2022-06-15 NOTE — Patient Instructions (Signed)

## 2022-06-16 ENCOUNTER — Ambulatory Visit: Payer: PPO | Admitting: Speech Pathology

## 2022-06-16 ENCOUNTER — Ambulatory Visit: Payer: PPO

## 2022-06-20 ENCOUNTER — Ambulatory Visit: Payer: PPO | Admitting: Speech Pathology

## 2022-06-20 DIAGNOSIS — R1312 Dysphagia, oropharyngeal phase: Secondary | ICD-10-CM | POA: Diagnosis not present

## 2022-06-20 DIAGNOSIS — R490 Dysphonia: Secondary | ICD-10-CM

## 2022-06-20 DIAGNOSIS — R1313 Dysphagia, pharyngeal phase: Secondary | ICD-10-CM
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
## 2022-06-22 ENCOUNTER — Ambulatory Visit: Payer: PPO

## 2022-06-23 ENCOUNTER — Ambulatory Visit: Payer: PPO | Admitting: Speech Pathology

## 2022-06-26 ENCOUNTER — Ambulatory Visit (INDEPENDENT_AMBULATORY_CARE_PROVIDER_SITE_OTHER): Payer: PPO

## 2022-06-26 VITALS — Ht 67.0 in | Wt 227.0 lb

## 2022-06-26 DIAGNOSIS — Z0000 Encounter for general adult medical examination without abnormal findings: Secondary | ICD-10-CM

## 2022-06-26 NOTE — Patient Instructions (Addendum)
Susan Miller , Thank you for taking time to come for your Medicare Wellness Visit. I appreciate your ongoing commitment to your health goals. Please review the following plan we discussed and let me know if I can assist you in the future.   These are the goals we discussed:  Goals Addressed               This Visit's Progress     Patient Stated     Increase physical activity (pt-stated)        Walk for exercise.         This is a list of the screening recommended for you and due dates:  Health Maintenance  Topic Date Due   DTaP/Tdap/Td vaccine (1 - Tdap) Never done   Zoster (Shingles) Vaccine (1 of 2) 06/29/2022*   COVID-19 Vaccine (4 - 2023-24 season) 07/12/2022*   Mammogram  12/24/2022   Medicare Annual Wellness Visit  06/27/2023   Pneumonia Vaccine  Completed   Flu Shot  Completed   DEXA scan (bone density measurement)  Completed   Hepatitis C Screening: USPSTF Recommendation to screen - Ages 18-79 yo.  Completed   HPV Vaccine  Aged Out   Colon Cancer Screening  Discontinued  *Topic was postponed. The date shown is not the original due date.    Advanced directives: End of life planning; Advance aging; Advanced directives discussed.  Copy of current HCPOA/Living Will requested.    Conditions/risks identified: none new.   Next appointment: Follow up in one year for your annual wellness visit    Preventive Care 65 Years and Older, Female Preventive care refers to lifestyle choices and visits with your health care provider that can promote health and wellness. What does preventive care include? A yearly physical exam. This is also called an annual well check. Dental exams once or twice a year. Routine eye exams. Ask your health care provider how often you should have your eyes checked. Personal lifestyle choices, including: Daily care of your teeth and gums. Regular physical activity. Eating a healthy diet. Avoiding tobacco and drug use. Limiting alcohol  use. Practicing safe sex. Taking low-dose aspirin every day. Taking vitamin and mineral supplements as recommended by your health care provider. What happens during an annual well check? The services and screenings done by your health care provider during your annual well check will depend on your age, overall health, lifestyle risk factors, and family history of disease. Counseling  Your health care provider may ask you questions about your: Alcohol use. Tobacco use. Drug use. Emotional well-being. Home and relationship well-being. Sexual activity. Eating habits. History of falls. Memory and ability to understand (cognition). Work and work Statistician. Reproductive health. Screening  You may have the following tests or measurements: Height, weight, and BMI. Blood pressure. Lipid and cholesterol levels. These may be checked every 5 years, or more frequently if you are over 5 years old. Skin check. Lung cancer screening. You may have this screening every year starting at age 46 if you have a 30-pack-year history of smoking and currently smoke or have quit within the past 15 years. Fecal occult blood test (FOBT) of the stool. You may have this test every year starting at age 56. Flexible sigmoidoscopy or colonoscopy. You may have a sigmoidoscopy every 5 years or a colonoscopy every 10 years starting at age 31. Hepatitis C blood test. Hepatitis B blood test. Sexually transmitted disease (STD) testing. Diabetes screening. This is done by checking your blood sugar (  glucose) after you have not eaten for a while (fasting). You may have this done every 1-3 years. Bone density scan. This is done to screen for osteoporosis. You may have this done starting at age 75. Mammogram. This may be done every 1-2 years. Talk to your health care provider about how often you should have regular mammograms. Talk with your health care provider about your test results, treatment options, and if necessary,  the need for more tests. Vaccines  Your health care provider may recommend certain vaccines, such as: Influenza vaccine. This is recommended every year. Tetanus, diphtheria, and acellular pertussis (Tdap, Td) vaccine. You may need a Td booster every 10 years. Zoster vaccine. You may need this after age 86. Pneumococcal 13-valent conjugate (PCV13) vaccine. One dose is recommended after age 39. Pneumococcal polysaccharide (PPSV23) vaccine. One dose is recommended after age 33. Talk to your health care provider about which screenings and vaccines you need and how often you need them. This information is not intended to replace advice given to you by your health care provider. Make sure you discuss any questions you have with your health care provider. Document Released: 05/21/2015 Document Revised: 01/12/2016 Document Reviewed: 02/23/2015 Elsevier Interactive Patient Education  2017 [place] Prevention in the Home Falls can cause injuries. They can happen to people of all ages. There are many things you can do to make your home safe and to help prevent falls. What can I do on the outside of my home? Regularly fix the edges of walkways and driveways and fix any cracks. Remove anything that might make you trip as you walk through a door, such as a raised step or threshold. Trim any bushes or trees on the path to your home. Use bright outdoor lighting. Clear any walking paths of anything that might make someone trip, such as rocks or tools. Regularly check to see if handrails are loose or broken. Make sure that both sides of any steps have handrails. Any raised decks and porches should have guardrails on the edges. Have any leaves, snow, or ice cleared regularly. Use sand or salt on walking paths during winter. Clean up any spills in your garage right away. This includes oil or grease spills. What can I do in the bathroom? Use night lights. Install grab bars by the toilet and in the  tub and shower. Do not use towel bars as grab bars. Use non-skid mats or decals in the tub or shower. If you need to sit down in the shower, use a plastic, non-slip stool. Keep the floor dry. Clean up any water that spills on the floor as soon as it happens. Remove soap buildup in the tub or shower regularly. Attach bath mats securely with double-sided non-slip rug tape. Do not have throw rugs and other things on the floor that can make you trip. What can I do in the bedroom? Use night lights. Make sure that you have a light by your bed that is easy to reach. Do not use any sheets or blankets that are too big for your bed. They should not hang down onto the floor. Have a firm chair that has side arms. You can use this for support while you get dressed. Do not have throw rugs and other things on the floor that can make you trip. What can I do in the kitchen? Clean up any spills right away. Avoid walking on wet floors. Keep items that you use a lot in easy-to-reach places.  If you need to reach something above you, use a strong step stool that has a grab bar. Keep electrical cords out of the way. Do not use floor polish or wax that makes floors slippery. If you must use wax, use non-skid floor wax. Do not have throw rugs and other things on the floor that can make you trip. What can I do with my stairs? Do not leave any items on the stairs. Make sure that there are handrails on both sides of the stairs and use them. Fix handrails that are broken or loose. Make sure that handrails are as long as the stairways. Check any carpeting to make sure that it is firmly attached to the stairs. Fix any carpet that is loose or worn. Avoid having throw rugs at the top or bottom of the stairs. If you do have throw rugs, attach them to the floor with carpet tape. Make sure that you have a light switch at the top of the stairs and the bottom of the stairs. If you do not have them, ask someone to add them for  you. What else can I do to help prevent falls? Wear shoes that: Do not have high heels. Have rubber bottoms. Are comfortable and fit you well. Are closed at the toe. Do not wear sandals. If you use a stepladder: Make sure that it is fully opened. Do not climb a closed stepladder. Make sure that both sides of the stepladder are locked into place. Ask someone to hold it for you, if possible. Clearly mark and make sure that you can see: Any grab bars or handrails. First and last steps. Where the edge of each step is. Use tools that help you move around (mobility aids) if they are needed. These include: Canes. Walkers. Scooters. Crutches. Turn on the lights when you go into a dark area. Replace any light bulbs as soon as they burn out. Set up your furniture so you have a clear path. Avoid moving your furniture around. If any of your floors are uneven, fix them. If there are any pets around you, be aware of where they are. Review your medicines with your doctor. Some medicines can make you feel dizzy. This can increase your chance of falling. Ask your doctor what other things that you can do to help prevent falls. This information is not intended to replace advice given to you by your health care provider. Make sure you discuss any questions you have with your health care provider. Document Released: 02/18/2009 Document Revised: 09/30/2015 Document Reviewed: 05/29/2014 Elsevier Interactive Patient Education  2017 [employer].

## 2022-06-26 NOTE — Progress Notes (Signed)
Subjective:   Susan Miller is a 77 y.o. female who presents for Medicare Annual (Subsequent) preventive examination.  Review of Systems    No ROS.  Medicare Wellness Virtual Visit.  Visual/audio telehealth visit, UTA vital signs.   See social history for additional risk factors.   Cardiac Risk Factors include: advanced age (>62mn, >>47women);hypertension     Objective:    Today's Vitals   06/26/22 0850  Weight: 227 lb (103 kg)  Height: 5' 7"$  (1.702 m)   Body mass index is 35.55 kg/m.     06/26/2022    9:14 AM 06/15/2022    1:00 PM 06/05/2022   10:31 AM 02/14/2022    9:08 AM 06/30/2021    8:13 AM 05/17/2021    1:38 PM 05/14/2020    1:44 PM  Advanced Directives  Does Patient Have a Medical Advance Directive? No No No No No No No  Would patient like information on creating a medical advance directive? No - Patient declined No - Patient declined No - Patient declined No - Patient declined  No - Patient declined No - Patient declined    Current Medications (verified) Outpatient Encounter Medications as of 06/26/2022  Medication Sig   albuterol (VENTOLIN HFA) 108 (90 Base) MCG/ACT inhaler Inhale 2 puffs into the lungs every 6 (six) hours as needed for wheezing or shortness of breath.   CLENPIQ 10-3.5-12 MG-GM -GM/175ML SOLN Take by mouth once.   esomeprazole (NEXIUM) 40 MG capsule TAKE 1 CAPSULE DAILY AT 12 NOON   estradiol (ESTRACE) 0.1 MG/GM vaginal cream Estrogen Cream Instruction Discard applicator Apply pea sized amount to tip of finger to urethra before bed. Wash hands well after application. Use Monday, Wednesday and Friday   Fluticasone Furoate (ARNUITY ELLIPTA) 100 MCG/ACT AEPB Inhale 1 puff into the lungs daily.   hydrochlorothiazide (HYDRODIURIL) 25 MG tablet TAKE 1/2 TABLET BY MOUTH EVERY DAY   losartan (COZAAR) 25 MG tablet TAKE 1 TABLET(25 MG) BY MOUTH DAILY   Na Sulfate-K Sulfate-Mg Sulf 17.5-3.13-1.6 GM/177ML SOLN See admin instructions. follow package directions  (Patient not taking: Reported on 06/05/2022)   rosuvastatin (CRESTOR) 10 MG tablet Take 1 tablet (10 mg total) by mouth daily.   sertraline (ZOLOFT) 25 MG tablet TAKE 1 TABLET BY MOUTH EVERY DAY   sulfamethoxazole-trimethoprim (BACTRIM DS) 800-160 MG tablet Take 1 tablet by mouth every 12 (twelve) hours.   trimethoprim (TRIMPEX) 100 MG tablet Take 1 tablet (100 mg total) by mouth daily.   No facility-administered encounter medications on file as of 06/26/2022.    Allergies (verified) Codeine, Iodinated contrast media, and Nitrofurantoin monohyd macro   History: Past Medical History:  Diagnosis Date   Allergy    Depression    History of chicken pox    HLD (hyperlipidemia)    Hx: UTI (urinary tract infection)    Hypertension    Sleep apnea    Past Surgical History:  Procedure Laterality Date   COLONOSCOPY WITH PROPOFOL N/A 06/20/2016   Procedure: COLONOSCOPY WITH PROPOFOL;  Surgeon: DLucilla Lame MD;  Location: [facility] ENDOSCOPY;  Service: Endoscopy;  Laterality: N/A;   COLONOSCOPY WITH PROPOFOL N/A 02/14/2022   Procedure: COLONOSCOPY WITH PROPOFOL;  Surgeon: WLucilla Lame MD;  [facility];  Service: Endoscopy;  Laterality: N/A;   ESOPHAGOGASTRODUODENOSCOPY N/A 02/14/2022   Procedure: ESOPHAGOGASTRODUODENOSCOPY (EGD);  Surgeon: WLucilla Lame MD;  [facility];  Service: Endoscopy;  Laterality: N/A;   ESOPHAGOGASTRODUODENOSCOPY (EGD) WITH PROPOFOL N/A 06/20/2016   Procedure: ESOPHAGOGASTRODUODENOSCOPY (EGD) WITH  PROPOFOL;  Surgeon: Lucilla Lame, MD;  Location: [facility] ENDOSCOPY;  Service: Endoscopy;  Laterality: N/A;   TONSILLECTOMY     as a child   TUMOR REMOVAL     behind ear   Family History  Problem Relation Age of Onset   Hypertension Mother    Cancer Sister        colon cancer   Hypertension Sister    Diabetes Sister    Hypertension Brother    Hypertension Maternal Aunt    Breast cancer Maternal Aunt 31   Hypertension Maternal Uncle    Stroke  Maternal Grandmother    Hypertension Maternal Grandmother    Social History   Socioeconomic History   Marital status: Divorced    Spouse name: Not on file   Number of children: Not on file   Years of education: Not on file   Highest education level: Not on file  Occupational History   Not on file  Tobacco Use   Smoking status: Never   Smokeless tobacco: Never  Vaping Use   Vaping Use: Never used  Substance and Sexual Activity   Alcohol use: No    Alcohol/week: 0.0 standard drinks of alcohol   Drug use: No   Sexual activity: Not Currently  Other Topics Concern   Not on file  Social History Narrative   Not on file   Social Determinants of Health   Financial Resource Strain: Medium Risk (06/25/2022)   Overall Financial Resource Strain (CARDIA)    Difficulty of Paying Living Expenses: Somewhat hard  Food Insecurity: Unknown (06/25/2022)   Hunger Vital Sign    Worried About Running Out of Food in the Last Year: Patient refused    [place] in the Last Year: Never true  Transportation Needs: No Transportation Needs (06/25/2022)   PRAPARE - Hydrologist (Medical): No    Lack of Transportation (Non-Medical): No  Physical Activity: Inactive (06/25/2022)   Exercise Vital Sign    Days of Exercise per Week: 0 days    Minutes of Exercise per Session: 0 min  Stress: Unknown (06/25/2022)   [place]    Feeling of Stress : Patient refused  Social Connections: Unknown (06/25/2022)   Social Connection and Isolation Panel [NHANES]    Frequency of Communication with Friends and Family: Three times a week    Frequency of Social Gatherings with Friends and Family: Patient refused    Attends Religious Services: Not on Advertising copywriter or Organizations: No    Attends Archivist Meetings: Never    Marital Status: Divorced    Tobacco Counseling Counseling given: Not  Answered   Clinical Intake:  Pre-visit preparation completed: Yes        Diabetes: No  How often do you need to have someone help you when you read instructions, pamphlets, or other written materials from your doctor or pharmacy?: 1 - Never   Interpreter Needed?: No      Activities of Daily Living    06/25/2022   12:29 PM 06/14/2022   12:19 PM  In your present state of health, do you have any difficulty performing the following activities:  Hearing? 0 0  Vision? 0 0  Comment Patient notes she procrastinates. Declines additional follow up at this time.   Walking or climbing stairs? 0 0  Dressing or bathing? 0 0  Doing errands, shopping? 0 0  Preparing Food and eating ? N N  Using the Toilet? N N  In the past six months, have you accidently leaked urine? Y Y  Comment Managed with daily pad/liner   Do you have problems with loss of bowel control? N N  Managing your Medications? N N  Managing your Finances? N N  Housekeeping or managing your Housekeeping? N N    Patient Care Team: Einar Pheasant, MD as PCP - General (Internal Medicine) Kate Sable, MD as PCP - Cardiology (Cardiology)  Indicate any recent Medical Services you may have received from other than [facility] providers in the past year (date may be approximate).     Assessment:   This is a routine wellness examination for [place].  I connected with  Lonzo Candy on 06/26/22 by a audio enabled telemedicine application and verified that I am speaking with the correct person using two identifiers.  Patient Location: Home  Provider Location: Office/Clinic  I discussed the limitations of evaluation and management by telemedicine. The patient expressed understanding and agreed to proceed.   Patient Medicare AWV questionnaire was completed by the patient on 06/25/22, I have confirmed that all information answered by patient is correct and no changes since this date.   Hearing/Vision screen Hearing  Screening - Comments:: Patient is able to hear conversational tones without difficulty.  No issues reported.   Vision Screening - Comments:: Followed by [facility]  Wears corrective lenses when reading They have regular follow up with the ophthalmologist   Dietary issues and exercise activities discussed: Current Exercise Habits: The patient does not participate in regular exercise at present [place]               This Visit's Progress     Patient Stated     Increase physical activity (pt-stated)        Walk for exercise.       Depression Screen    06/26/2022    8:59 AM 03/29/2022   11:11 AM 01/25/2022    7:40 AM 12/13/2021    8:10 AM 11/30/2021    9:39 AM 05/17/2021    1:38 PM 05/03/2021    4:10 PM  PHQ 2/9 Scores  PHQ - 2 Score 2 0 2 2 3 1 2  $ PHQ- 9 Score 3  9 7 7      $ Fall Risk    06/25/2022   12:29 PM 06/14/2022   12:19 PM 03/29/2022   11:11 AM 01/25/2022    7:40 AM 12/13/2021    8:09 AM  Fall Risk   Falls in the past year? 0 0 0 1 0  Number falls in past yr: 0  0 0 0  Injury with Fall? 0  0 1 0  Risk for fall due to :   No Fall Risks Impaired balance/gait No Fall Risks  Follow up Falls evaluation completed;Falls prevention discussed  Falls evaluation completed Falls evaluation completed Falls evaluation completed    FALL RISK PREVENTION PERTAINING TO THE HOME: Home free of loose throw rugs in walkways, pet beds, electrical cords, etc? Yes  Adequate lighting in your home to reduce risk of falls? Yes   ASSISTIVE DEVICES UTILIZED TO PREVENT FALLS: Life alert? No  Use of a cane, walker or w/c? No  Grab bars in the bathroom? Yes  Shower chair or bench in shower? No  Comfort chair height toilet? Yes   TIMED UP AND GO: Was the test performed? No .  Cognitive Function:    12/06/2017    2:20 PM 12/07/2016   10:37 AM  MMSE - Mini Mental State Exam  Orientation to time 5 5  Orientation to Place 5 5  Registration 3 3  Attention/  Calculation 5 5  Recall 3 3  Language- name 2 objects 2 2  Language- repeat 1 1  Language- follow 3 step command 3 3  Language- read & follow direction 1 1  Write a sentence 1 1  Copy design 1 1  Total score 30 30        06/26/2022    9:06 AM 05/14/2020    1:50 PM 05/14/2019    9:30 AM  6CIT Screen  What Year? 0 points 0 points 0 points  What month? 0 points 0 points 0 points  What time? 0 points 0 points 0 points  Count back from 20 0 points 0 points 0 points  Months in reverse 0 points 0 points 0 points  Repeat phrase 0 points 0 points 0 points  Total Score 0 points 0 points 0 points    Immunizations Immunization History  Administered Date(s) Administered   Fluad Quad(high Dose 65+) 01/31/2019, 01/29/2020, 01/25/2022   Influenza, High Dose Seasonal PF 01/11/2016, 04/27/2017, 01/18/2018   Influenza,inj,Quad PF,6+ Mos 02/24/2013, 02/27/2014, 02/22/2015   PFIZER(Purple Top)SARS-COV-2 Vaccination 06/23/2019, 07/14/2019, 01/06/2022   Pneumococcal Conjugate-13 11/06/2016   Pneumococcal Polysaccharide-23 12/06/2017   RSV,unspecified 01/06/2022   TDAP status: Due, Education has been provided regarding the importance of this vaccine. Advised may receive this vaccine at local pharmacy or Health Dept. Aware to provide a copy of the vaccination record if obtained from local pharmacy or Health Dept. Verbalized acceptance and understanding.  Screening Tests Health Maintenance  Topic Date Due   DTaP/Tdap/Td (1 - Tdap) Never done   Zoster Vaccines- Shingrix (1 of 2) 06/29/2022 (Originally 03/06/1965)   COVID-19 Vaccine (4 - 2023-24 season) 07/12/2022 (Originally 03/03/2022)   MAMMOGRAM  12/24/2022   Medicare Annual Wellness (AWV)  06/27/2023   Pneumonia Vaccine 46+ Years old  Completed   INFLUENZA VACCINE  Completed   DEXA SCAN  Completed   Hepatitis C Screening  Completed   HPV VACCINES  Aged Out   COLONOSCOPY (Pts 45-14yr Insurance coverage will need to be confirmed)  Discontinued     Health Maintenance Health Maintenance Due  Topic Date Due   DTaP/Tdap/Td (1 - Tdap) Never done   Lung Cancer Screening: (Low Dose CT Chest recommended if Age 77-80years, 30 pack-year currently smoking OR have quit w/in 15years.) does not qualify.   Hepatitis C Screening: Completed 2017.  Vision Screening: Recommended annual ophthalmology exams for early detection of glaucoma and other disorders of the eye.  Dental Screening: Recommended annual dental exams for proper oral hygiene  Community Resource Referral / Chronic Care Management: CRR required this visit?  No   CCM required this visit?  No      Plan:     I have personally reviewed and noted the following in the patient's chart:   Medical and social history Use of alcohol, tobacco or illicit drugs  Current medications and supplements including opioid prescriptions. Patient is not currently taking opioid prescriptions. Functional ability and status Nutritional status Physical activity Advanced directives List of other physicians Hospitalizations, surgeries, and ER visits in previous 12 months Vitals Screenings to include cognitive, depression, and falls Referrals and appointments  In addition, I have reviewed and discussed with patient certain preventive protocols, quality metrics, and best  practice recommendations. A written personalized care plan for preventive services as well as general preventive health recommendations were provided to patient.     Leta Jungling, LPN   [id number]

## 2022-06-27 ENCOUNTER — Ambulatory Visit: Payer: PPO | Admitting: Speech Pathology

## 2022-06-30 ENCOUNTER — Encounter: Payer: PPO | Admitting: Speech Pathology

## 2022-07-04 ENCOUNTER — Ambulatory Visit: Payer: PPO | Admitting: Speech Pathology

## 2022-07-04 DIAGNOSIS — R1312 Dysphagia, oropharyngeal phase: Secondary | ICD-10-CM

## 2022-07-04 NOTE — Therapy (Signed)
OUTPATIENT SPEECH LANGUAGE PATHOLOGY TREATMENT NOTE DISCHARGE SUMMARY   Patient Name: Susan Miller MRN: [id number] DOB:August 15, 1945, 77 y.o., female Today's Date: 07/04/2022  PCP: Einar Pheasant, MD REFERRING PROVIDER: Einar Pheasant, MD  END OF SESSION:   End of Session - 07/04/22 1111     Visit Number 4    Number of Visits 17    Date for SLP Re-Evaluation 08/01/22    Authorization Type [employer] P[employer]    Progress Note Due on Visit 10    SLP Start Time 1100    SLP Stop Time  1130    SLP Time Calculation (min) 30 min    Activity Tolerance Patient tolerated treatment well             Past Medical History:  Diagnosis Date   Allergy    Depression    History of chicken pox    HLD (hyperlipidemia)    Hx: UTI (urinary tract infection)    Hypertension    Sleep apnea    Past Surgical History:  Procedure Laterality Date   COLONOSCOPY WITH PROPOFOL N/A 06/20/2016   Procedure: COLONOSCOPY WITH PROPOFOL;  Surgeon: Lucilla Lame, MD;  Location: [facility] ENDOSCOPY;  Service: Endoscopy;  Laterality: N/A;   COLONOSCOPY WITH PROPOFOL N/A 02/14/2022   Procedure: COLONOSCOPY WITH PROPOFOL;  Surgeon: Lucilla Lame, MD;  Location: [facility] ENDOSCOPY;  Service: Endoscopy;  Laterality: N/A;   ESOPHAGOGASTRODUODENOSCOPY N/A 02/14/2022   Procedure: ESOPHAGOGASTRODUODENOSCOPY (EGD);  Surgeon: Lucilla Lame, MD;  [facility];  Service: Endoscopy;  Laterality: N/A;   ESOPHAGOGASTRODUODENOSCOPY (EGD) WITH PROPOFOL N/A 06/20/2016   Procedure: ESOPHAGOGASTRODUODENOSCOPY (EGD) WITH PROPOFOL;  Surgeon: Lucilla Lame, MD;  Location: [facility] ENDOSCOPY;  Service: Endoscopy;  Laterality: N/A;   TONSILLECTOMY     as a child   TUMOR REMOVAL     behind ear   Patient Active Problem List   Diagnosis Date Noted   Polyp of ascending colon    Knee pain, right 12/13/2021   Major depressive disorder, single episode, mild ([place]) 12/11/2021   Anemia 04/10/2021   Aortic atherosclerosis ([place])  04/10/2021   UTI (urinary tract infection) 03/27/2021   Cutaneous skin tags 12/12/2020   B12 deficiency 12/12/2020   OSA (obstructive sleep apnea) 04/18/2020   Numbness and tingling of both feet 02/20/2020   Daytime hypersomnolence 01/29/2020   Dysuria 12/07/2019   Snoring 12/07/2019   Hypercholesteremia 10/09/2019   Back pain 11/15/2018   SOB (shortness of breath) 11/15/2018   Decreased GFR 11/14/2018   Hyperbilirubinemia 11/14/2018   Abdominal bloating 12/09/2017   Osteopenia 04/29/2017   Hyperglycemia 04/27/2017   Reflux esophagitis    Heartburn    Special screening for malignant neoplasms, colon    Hair loss 10/26/2015   Fatigue 10/21/2015   Vitamin D deficiency 03/15/2015   Weight loss counseling, encounter for 02/22/2015   Facial erythema 07/27/2014   Health care maintenance 07/05/2014   BP (high blood pressure) 03/26/2014   Headache 03/08/2014   Eyelid lesion 03/08/2014   Shingles 11/16/2013   Light headedness 06/22/2013   Cough 05/18/2013   GERD (gastroesophageal reflux disease) 05/18/2013   Swelling of extremity 03/01/2013   Left knee pain 03/01/2013   Essential hypertension, benign 12/15/2012   Frequent UTI 12/15/2012   Mild depression 12/15/2012   Environmental allergies 12/15/2012    ONSET DATE: 05/17/2022    REFERRING DIAG: R19.8 Difficulty Swallowing Pills   PERTINENT HISTORY: 05/17/20 Pt referred due to difficulity swallowing pills. She has had increased dental word and feels some  of her swallowing issues are related to this.  04/07/21: Unable to swallow tablets and acute cough diagnosed  12/23/20 COVID  2018 Esophagitis  History of iron deficiency    06/29/21 Given information on cyclical coughing by pulmonologist       DIAGNOSTIC FINDINGS: Pt had chest xray on 05/16/21 for persisting cough  Negative for acute cardiopulmonary disease 02/14/22 EGD-small hiatal hernia present   THERAPY DIAG:  Dysphagia, oropharyngeal phase  Rationale for Evaluation  and Treatment Rehabilitation  SUBJECTIVE: pt pleasant, eager, motivated  Pt accompanied by: self  PAIN:  Are you having pain? No  PATIENT GOALS: to swallow pills  OBJECTIVE:   TODAY'S TREATMENT: Skilled treatment session focused pt's dysphagia goals, vocal hygiene, reflux precautions. SLP facilitated session by providing the following interventions:  Pt reports no improvement with medicine taking. She continues to use improved vocal hygiene.   PATIENT EDUCATION: Education details: see above Person educated: Patient Education method: Consulting civil engineer, Demonstration, and Handouts Education comprehension: verbalized understanding and needs further education  HOME EXERCISE PROGRAM: Read the information described above, increase consumption of water by 1 glass of water each morning, engage in recommended strategies to decreased habitual throat clear   GOALS: Goals reviewed with patient? Yes   SHORT TERM GOALS: Target date: 10 sessions   Patient will participate in objective swallowing evaluation (MBSS) to identify safest diet recommendation as well as therapeutic targets. Baseline: Goal status: Deferred by pt request   2.   The patient will eliminate phonotraumatic behaviors such as chronic throat clearing, by substituting non-traumatic methods to clear mucus. Baseline:  Goal status: MET     LONG TERM GOALS: Target date: 08/01/2022   Patient will consume recommended diet using strategies and compensations without overt s/sx aspiration >95% of the time. Baseline:  Goal status: MET   ASSESSMENT:  CLINICAL IMPRESSION: Pt has made good progress over the course of skilled ST sessions and is appropriate for discharge from services.     Nashay Brickley B. Rutherford Nail, M.S., CCC-SLP, Mining engineer Certified Brain Injury [place]  [place] Office [phone] Ascom [phone] Fax [phone]

## 2022-07-07 ENCOUNTER — Encounter: Payer: PPO | Admitting: Speech Pathology

## 2022-07-18 ENCOUNTER — Ambulatory Visit: Payer: PPO | Admitting: Speech Pathology

## 2022-07-20 ENCOUNTER — Encounter: Payer: PPO | Admitting: Speech Pathology

## 2022-07-20 ENCOUNTER — Telehealth: Payer: Self-pay | Admitting: Internal Medicine

## 2022-07-20 DIAGNOSIS — R739 Hyperglycemia, unspecified: Secondary | ICD-10-CM

## 2022-07-20 DIAGNOSIS — E7800 Pure hypercholesterolemia, unspecified: Secondary | ICD-10-CM

## 2022-07-20 NOTE — Addendum Note (Signed)
Addended by: Roetta Sessions D on: 07/20/2022 01:17 PM   Modules accepted: Orders

## 2022-07-20 NOTE — Telephone Encounter (Signed)
Labs ordered.

## 2022-07-20 NOTE — Telephone Encounter (Signed)
Patient has a lab appt on 07/27/2022, there are No orders in.

## 2022-07-25 ENCOUNTER — Encounter: Payer: PPO | Admitting: Speech Pathology

## 2022-07-25 ENCOUNTER — Encounter: Payer: Self-pay | Admitting: Family Medicine

## 2022-07-25 ENCOUNTER — Telehealth (INDEPENDENT_AMBULATORY_CARE_PROVIDER_SITE_OTHER): Payer: PPO | Admitting: Family Medicine

## 2022-07-25 DIAGNOSIS — R067 Sneezing: Secondary | ICD-10-CM | POA: Diagnosis not present

## 2022-07-25 DIAGNOSIS — R0981 Nasal congestion: Secondary | ICD-10-CM | POA: Diagnosis not present

## 2022-07-25 NOTE — Patient Instructions (Addendum)
INSTRUCTIONS FOR UPPER RESPIRATORY INFECTION:  -consider retesting for covid, if positive an you desire antiviral please call back today  -plenty of rest and fluids  -nasal saline wash 2-3 times daily (use prepackaged nasal saline)  -can use AFRIN nasal spray for drainage and nasal congestion - but do NOT use longer then 3-4 days  -can use tylenol (in no history of liver disease) as directed for aches and sorethroat  -in the winter time, using a humidifier at night is helpful (please follow cleaning instructions)  -herbal peppermint tea  -if you are taking a cough medication - use only as directed, may also try a teaspoon of honey to coat the throat and throat lozenges. If given a cough medication with codeine or hydrocodone or other narcotic please be advised that this contains a strong and  potentially addicting medication. Please follow instructions carefully, take as little as possible and only use AS NEEDED for severe cough. Discuss potential side effects with your pharmacy. Please do not drive or operate machinery while taking these types of medications. Please do not take other sedating medications, drugs or alcohol while taking this medication without discussing with your doctor.  -for sore throat, salt water gargles can help  -follow up if you have fevers, facial pain, tooth pain, difficulty breathing or are worsening or symptoms persist longer then expected  Upper Respiratory Infection, Adult An upper respiratory infection (URI) is also known as the common cold. It is often caused by a type of germ (virus). Colds are easily spread (contagious). You can pass it to others by kissing, coughing, sneezing, or drinking out of the same glass. Usually, you get better in 1 to 2  weeks.  However, the cough can last for even longer. HOME CARE  Only take medicine as told by your doctor. Follow instructions provided above. Drink enough water and fluids to keep your pee (urine) clear or pale  yellow. Get plenty of rest. Return to work when your temperature is < 100 for 24 hours or as told by your doctor. You may use a face mask and wash your hands to stop your cold from spreading. GET HELP RIGHT AWAY IF:  After the first few days, you feel you are getting worse. You have questions about your medicine. You have chills, shortness of breath, or red spit (mucus). You have pain in the face for more then 1-2 days, especially when you bend forward. You have a fever, puffy (swollen) neck, pain when you swallow, or white spots in the back of your throat. You have a bad headache, ear pain, sinus pain, or chest pain. You have a high-pitched whistling sound when you breathe in and out (wheezing). You cough up blood. You have sore muscles or a stiff neck. MAKE SURE YOU:  Understand these instructions. Will watch your condition. Will get help right away if you are not doing well or get worse. Document Released: 10/11/2007 Document Revised: 07/17/2011 Document Reviewed: 07/30/2013 [facility] Patient Information 2015 [place], [place]. This information is not intended to replace advice given to you by your health care provider. Make sure you discuss any questions you have with your health care provider.

## 2022-07-25 NOTE — Progress Notes (Signed)
Virtual Visit via Video Note  I connected with Susan Miller  on 07/25/22 at 12:20 PM EDT by a video enabled telemedicine application and verified that I am speaking with the correct person using two identifiers.  Location patient: [place] Location provider:work or home office Persons participating in the virtual visit: patient, provider  I discussed the limitations and requested verbal permission for telemedicine visit. The patient expressed understanding and agreed to proceed.   HPI:  Acute telemedicine visit for sinus issues: -Onset: about 4 days ago -she thinks this is a cold -Son was sick with the same last week -Symptoms include: runny nose, sneezing, itchy and watery eyes, developing some sinus pressure -reports most bothersome symptoms is runny nose -covid test done 07/22/22 negative -Denies: change in cough, fevers, NVD, CP, SOB -Has tried: did start taking some zyrtec a few days ago -Pertinent past medical history: see below -Pertinent medication allergies: Allergies  Allergen Reactions   Codeine Hives   Iodinated Contrast Media Swelling   Nitrofurantoin Monohyd Macro Nausea Only   -COVID-19 vaccine status: has had all of the shots and the recent boost in the fall Immunization History  Administered Date(s) Administered   Fluad Quad(high Dose 65+) 01/31/2019, 01/29/2020, 01/25/2022   Influenza, High Dose Seasonal PF 01/11/2016, 04/27/2017, 01/18/2018   Influenza,inj,Quad PF,6+ Mos 02/24/2013, 02/27/2014, 02/22/2015   PFIZER(Purple Top)SARS-COV-2 Vaccination 06/23/2019, 07/14/2019, 01/06/2022   Pneumococcal Conjugate-13 11/06/2016   Pneumococcal Polysaccharide-23 12/06/2017   RSV,unspecified 01/06/2022     ROS: See pertinent positives and negatives per HPI.  Past Medical History:  Diagnosis Date   Allergy    Depression    History of chicken pox    HLD (hyperlipidemia)    Hx: UTI (urinary tract infection)    Hypertension    Sleep apnea     Past Surgical History:   Procedure Laterality Date   COLONOSCOPY WITH PROPOFOL N/A 06/20/2016   Procedure: COLONOSCOPY WITH PROPOFOL;  Surgeon: Lucilla Lame, MD;  Location: [facility] ENDOSCOPY;  Service: Endoscopy;  Laterality: N/A;   COLONOSCOPY WITH PROPOFOL N/A 02/14/2022   Procedure: COLONOSCOPY WITH PROPOFOL;  Surgeon: Lucilla Lame, MD;  Location: [facility] ENDOSCOPY;  Service: Endoscopy;  Laterality: N/A;   ESOPHAGOGASTRODUODENOSCOPY N/A 02/14/2022   Procedure: ESOPHAGOGASTRODUODENOSCOPY (EGD);  Surgeon: Lucilla Lame, MD;  Location: [facility] ENDOSCOPY;  Service: Endoscopy;  Laterality: N/A;   ESOPHAGOGASTRODUODENOSCOPY (EGD) WITH PROPOFOL N/A 06/20/2016   Procedure: ESOPHAGOGASTRODUODENOSCOPY (EGD) WITH PROPOFOL;  Surgeon: Lucilla Lame, MD;  Location: [facility] ENDOSCOPY;  Service: Endoscopy;  Laterality: N/A;   TONSILLECTOMY     as a child   TUMOR REMOVAL     behind ear     Current Outpatient Medications:    albuterol (VENTOLIN HFA) 108 (90 Base) MCG/ACT inhaler, Inhale 2 puffs into the lungs every 6 (six) hours as needed for wheezing or shortness of breath., Disp: 8 g, Rfl: 2   esomeprazole (NEXIUM) 40 MG capsule, TAKE 1 CAPSULE DAILY AT 12 NOON, Disp: 90 capsule, Rfl: 0   Fluticasone Furoate (ARNUITY ELLIPTA) 100 MCG/ACT AEPB, Inhale 1 puff into the lungs daily., Disp: 30 each, Rfl: 1   hydrochlorothiazide (HYDRODIURIL) 25 MG tablet, TAKE 1/2 TABLET BY MOUTH EVERY DAY, Disp: 45 tablet, Rfl: 1   losartan (COZAAR) 25 MG tablet, TAKE 1 TABLET(25 MG) BY MOUTH DAILY, Disp: 90 tablet, Rfl: 0   rosuvastatin (CRESTOR) 10 MG tablet, Take 1 tablet (10 mg total) by mouth daily., Disp: 90 tablet, Rfl: 0   sertraline (ZOLOFT) 25 MG tablet, TAKE 1 TABLET BY MOUTH EVERY  DAY, Disp: 90 tablet, Rfl: 1   CLENPIQ 10-3.5-12 MG-GM -GM/175ML SOLN, Take by mouth once., Disp: , Rfl:    estradiol (ESTRACE) 0.1 MG/GM vaginal cream, Estrogen Cream Instruction Discard applicator Apply pea sized amount to tip of finger to urethra before bed. Wash hands  well after application. Use Monday, Wednesday and Friday (Patient not taking: Reported on 07/25/2022), Disp: 42.5 g, Rfl: 12   Na Sulfate-K Sulfate-Mg Sulf 17.5-3.13-1.6 GM/177ML SOLN, See admin instructions. follow package directions (Patient not taking: Reported on 06/05/2022), Disp: , Rfl:    sulfamethoxazole-trimethoprim (BACTRIM DS) 800-160 MG tablet, Take 1 tablet by mouth every 12 (twelve) hours. (Patient not taking: Reported on 07/25/2022), Disp: 14 tablet, Rfl: 0   trimethoprim (TRIMPEX) 100 MG tablet, Take 1 tablet (100 mg total) by mouth daily., Disp: 90 tablet, Rfl: 0  EXAM:  VITALS per patient if applicable:  GENERAL: alert, oriented, appears well and in no acute distress  HEENT: atraumatic, conjunttiva clear, no obvious abnormalities on inspection of external nose and ears, does have clear nasal discharge at times  NECK: normal movements of the head and neck  LUNGS: on inspection no signs of respiratory distress, breathing rate appears normal, no obvious gross SOB, gasping or wheezing  CV: no obvious cyanosis  MS: moves all visible extremities without noticeable abnormality  PSYCH/NEURO: pleasant and cooperative, no obvious depression or anxiety, speech and thought processing grossly intact  ASSESSMENT AND PLAN:  Discussed the following assessment and plan:  Nasal congestion  Sneezing  -we discussed possible serious and likely etiologies, options for evaluation and workup, limitations of telemedicine visit vs in person visit, treatment, treatment risks and precautions. Pt is agreeable to treatment via telemedicine at this moment. Query VURI, allergic rhinitis vs other. Discussed possibility of covid and she plans to test again - ut she is not sure if she wants to take antiviral if positive - considering legevrio and reports she will call today if positive and wants to take it. O/w opted to do nasal saline rinses (store bought not home made to avoid contamination), steam,  nasal decongestant, tylenol - low dose if needed.  Advised to seek prompt virtual visit or in person care if worsening, new symptoms arise, or if is not improving with treatment over the next few days as expected per our conversation of expected course. Discussed options for follow up care. Did let this patient know that I do telemedicine on Tuesdays and Thursdays for [place] and those are the days I am logged into the system. Advised to schedule follow up visit with PCP, [facility] virtual visits or [facility] if any further questions or concerns to avoid delays in care.   I discussed the assessment and treatment plan with the patient. The patient was provided an opportunity to ask questions and all were answered. The patient agreed with the plan and demonstrated an understanding of the instructions.     Lucretia Kern, DO

## 2022-07-27 ENCOUNTER — Other Ambulatory Visit: Payer: PPO

## 2022-07-27 ENCOUNTER — Encounter: Payer: PPO | Admitting: Speech Pathology

## 2022-07-31 ENCOUNTER — Ambulatory Visit: Payer: PPO | Admitting: Internal Medicine

## 2022-08-02 ENCOUNTER — Ambulatory Visit: Payer: PPO | Admitting: Family

## 2022-08-03 ENCOUNTER — Encounter: Payer: PPO | Admitting: Speech Pathology

## 2022-08-07 ENCOUNTER — Other Ambulatory Visit: Payer: Self-pay | Admitting: Pulmonary Disease

## 2022-08-08 ENCOUNTER — Encounter: Payer: PPO | Admitting: Speech Pathology

## 2022-08-09 ENCOUNTER — Other Ambulatory Visit: Payer: Self-pay | Admitting: Internal Medicine

## 2022-08-10 ENCOUNTER — Encounter: Payer: PPO | Admitting: Speech Pathology

## 2022-08-15 ENCOUNTER — Encounter: Payer: PPO | Admitting: Speech Pathology

## 2022-08-15 ENCOUNTER — Other Ambulatory Visit (INDEPENDENT_AMBULATORY_CARE_PROVIDER_SITE_OTHER): Payer: PPO

## 2022-08-15 DIAGNOSIS — E7800 Pure hypercholesterolemia, unspecified: Secondary | ICD-10-CM | POA: Diagnosis not present

## 2022-08-15 DIAGNOSIS — R739 Hyperglycemia, unspecified: Secondary | ICD-10-CM | POA: Diagnosis not present

## 2022-08-15 LAB — HEPATIC FUNCTION PANEL
ALT: 12 U/L (ref 0–35)
AST: 14 U/L (ref 0–37)
Albumin: 4.4 g/dL (ref 3.5–5.2)
Alkaline Phosphatase: 75 U/L (ref 39–117)
Bilirubin, Direct: 0.1 mg/dL (ref 0.0–0.3)
Total Bilirubin: 0.5 mg/dL (ref 0.2–1.2)
Total Protein: 6.9 g/dL (ref 6.0–8.3)

## 2022-08-15 LAB — BASIC METABOLIC PANEL
BUN: 14 mg/dL (ref 6–23)
CO2: 30 mEq/L (ref 19–32)
Calcium: 9.9 mg/dL (ref 8.4–10.5)
Chloride: 102 mEq/L (ref 96–112)
Creatinine, Ser: 1.01 mg/dL (ref 0.40–1.20)
GFR: 54.1 mL/min — ABNORMAL LOW (ref >60.00)
Glucose, Bld: 99 mg/dL (ref 70–99)
Potassium: 4.1 mEq/L (ref 3.5–5.1)
Sodium: 140 mEq/L (ref 135–145)

## 2022-08-15 LAB — HEMOGLOBIN A1C: Hgb A1c MFr Bld: 6.2 % (ref 4.6–6.5)

## 2022-08-15 LAB — LIPID PANEL
Cholesterol: 130 mg/dL (ref 0–200)
HDL: 46.4 mg/dL (ref >39.00)
LDL Cholesterol: 64 mg/dL (ref 0–99)
NonHDL: 83.59
Total CHOL/HDL Ratio: 3
Triglycerides: 99 mg/dL (ref 0.0–149.0)
VLDL: 19.8 mg/dL (ref 0.0–40.0)

## 2022-08-17 ENCOUNTER — Encounter: Payer: Self-pay | Admitting: Internal Medicine

## 2022-08-17 ENCOUNTER — Ambulatory Visit (INDEPENDENT_AMBULATORY_CARE_PROVIDER_SITE_OTHER): Payer: PPO | Admitting: Internal Medicine

## 2022-08-17 ENCOUNTER — Encounter: Payer: PPO | Admitting: Speech Pathology

## 2022-08-17 VITALS — BP 122/74 | HR 76 | Temp 98.1°F | Ht 67.0 in | Wt 224.4 lb

## 2022-08-17 DIAGNOSIS — N390 Urinary tract infection, site not specified: Secondary | ICD-10-CM | POA: Diagnosis not present

## 2022-08-17 DIAGNOSIS — D649 Anemia, unspecified: Secondary | ICD-10-CM

## 2022-08-17 DIAGNOSIS — K219 Gastro-esophageal reflux disease without esophagitis: Secondary | ICD-10-CM

## 2022-08-17 DIAGNOSIS — I700 Atherosclerosis of aorta: Secondary | ICD-10-CM | POA: Diagnosis not present

## 2022-08-17 DIAGNOSIS — R739 Hyperglycemia, unspecified: Secondary | ICD-10-CM | POA: Diagnosis not present

## 2022-08-17 DIAGNOSIS — F320 Major depressive disorder, single episode, mild: Secondary | ICD-10-CM

## 2022-08-17 DIAGNOSIS — I10 Essential (primary) hypertension: Secondary | ICD-10-CM

## 2022-08-17 DIAGNOSIS — G4733 Obstructive sleep apnea (adult) (pediatric): Secondary | ICD-10-CM | POA: Diagnosis not present

## 2022-08-17 DIAGNOSIS — E7800 Pure hypercholesterolemia, unspecified: Secondary | ICD-10-CM | POA: Diagnosis not present

## 2022-08-17 MED ORDER — SERTRALINE HCL 25 MG PO TABS: 25.0000 mg | ORAL_TABLET | Freq: Every day | ORAL | 1 refills | Status: DC

## 2022-08-17 MED ORDER — HYDROCHLOROTHIAZIDE 25 MG PO TABS: 12.5000 mg | ORAL_TABLET | Freq: Every day | ORAL | 1 refills | Status: DC

## 2022-08-17 MED ORDER — LOSARTAN POTASSIUM 25 MG PO TABS: ORAL_TABLET | ORAL | 2 refills | Status: DC

## 2022-08-17 MED ORDER — ESOMEPRAZOLE MAGNESIUM 40 MG PO CPDR: DELAYED_RELEASE_CAPSULE | ORAL | 2 refills | Status: DC

## 2022-08-17 MED ORDER — ROSUVASTATIN CALCIUM 10 MG PO TABS: 10.0000 mg | ORAL_TABLET | Freq: Every day | ORAL | 2 refills | Status: DC

## 2022-08-17 NOTE — Progress Notes (Signed)
Subjective:    Patient ID: Susan SensingLinda H Miller, female    DOB: May 22, 1945, 77 y.o. y.o.   MRN: [id number]  Patient here for  Chief Complaint  Patient presents with   Medical Management of Chronic Issues    HPI Here for follow up - hypertension.  Reports she is doing relatively well. Saw Dr Apolinar JunesBrandon 03/08/22 - w/up for frequent UTIs. Had renal ultrasound 03/2022 - ok.  Cystoscopy 04/05/22 - unremarkable. Placed on suppression abx.  Saw Dr Orlie DakinFinnegan - 06/05/22 - anemia.  Suspect IDA. Colonoscopy and EGD in October 2023 did not reveal any significant pathology. Patient could not tolerate video endoscopy. Recommended IV venofer. Saw speech therapy - recommended ENT referral.  No chest pain or sob reported.  Increased stress with her son's health issues.  Overall handling things relatively well.   Past Medical History:  Diagnosis Date   Allergy    Depression    History of chicken pox    HLD (hyperlipidemia)    Hx: UTI (urinary tract infection)    Hypertension    Sleep apnea    Past Surgical History:  Procedure Laterality Date   COLONOSCOPY WITH PROPOFOL N/A 06/20/2016   Procedure: COLONOSCOPY WITH PROPOFOL;  Surgeon: Midge Miniumarren Wohl, MD;  Location: [facility] ENDOSCOPY;  Service: Endoscopy;  Laterality: N/A;   COLONOSCOPY WITH PROPOFOL N/A 02/14/2022   Procedure: COLONOSCOPY WITH PROPOFOL;  Surgeon: Midge MiniumWohl, Darren, MD;  [facility] END[facility]COPY;  Service: Endoscopy;  Laterality: N/A;   ESOPHAGOGASTRODUODENOSCOPY N/A 02/14/2022   Procedure: ESOPHAGOGASTRODUODENOSCOPY (EGD);  Surgeon: Midge MiniumWohl, Darren, MD;  Location[facility] ENDOSCOPY;  Service: Endoscopy;  Laterality: N/A;   ESOPHAGOGASTRODUODENOSCOPY (EGD) WITH PROPOFOL N/A 06/20/2016   Procedure: ESOPHAGOGASTRODUODENOSCOPY (EGD) WITH PROPOFOL;  Surgeon: Midge Miniumarren Wohl, MD;  Location: [facility] ENDOSCOPY;  Service: Endoscopy;  Laterality: N/A;   TONSILLECTOMY     as a child   TUMOR REMOVAL     behind ear   Family History  Problem Relation Age of Onset   Hypertension  Mother    Cancer Sister        colon cancer   Hypertension Sister    Diabetes Sister    Hypertension Brother    Hypertension Maternal Aunt    Breast cancer Maternal Aunt 60   Hypertension Maternal Uncle    Stroke Maternal Grandmother    Hypertension Maternal Grandmother    Social History   Socioeconomic History   Marital status: Divorced    Spouse name: Not on file   Number of children: Not on file   Years of education: Not on file   Highest education level: Associate degree: occupational, Scientist, product/process developmenttechnical, or vocational program  Occupational History   Not on file  Tobacco Use   Smoking status: Never   Smokeless tobacco: Never  Vaping Use   Vaping Use: Never used  Substance and Sexual Activity   Alcohol use: No    Alcohol/week: 0.0 standard drinks of alcohol   Drug use: No   Sexual activity: Not Currently  Other Topics Concern   Not on file  Social History Narrative   Not on file   Social Determinants of Health   Financial Resource Strain: Medium Risk (08/13/2022)   Overall Financial Resource Strain (CARDIA)    Difficulty of Paying Living Expenses: Somewhat hard  Food Insecurity: Food Insecurity Present (08/13/2022)   Hunger Vital Sign    Worried About Running Out of Food in the Last Year: Sometimes true    Ran Out of Food in the Last Year: Never true  Transportation Needs: No Transportation Needs (08/13/2022)   PRAPARE - Administrator, Civil Service (Medical): No    Lack of Transportation (Non-Medical): No  Physical Activity: Inactive (08/13/2022)   Exercise Vital Sign    Days of Exercise per Week: 0 days    Minutes of Exercise per Session: 0 min  Stress: Stress Concern Present (08/13/2022)   [employer] of Occupational Health - Occupational Stress Questionnaire    Feeling of Stress : To some extent  Social Connections: Socially Isolated (08/13/2022)   Social Connection and Isolation Panel [NHANES]    Frequency of Communication with Friends and Family: Three  times a week    Frequency of Social Gatherings with Friends and Family: Once a week    Attends Religious Services: Never    Database administrator or Organizations: No    Attends Banker Meetings: Never    Marital Status: Divorced     Review of Systems  Constitutional:  Negative for appetite change and unexpected weight change.  HENT:  Negative for congestion and sinus pressure.   Respiratory:  Negative for cough, chest tightness and shortness of breath.   Cardiovascular:  Negative for chest pain and palpitations.  Gastrointestinal:  Negative for abdominal pain, diarrhea, nausea and vomiting.  Genitourinary:  Negative for difficulty urinating and dysuria.  Musculoskeletal:  Negative for joint swelling and myalgias.  Skin:  Negative for color change and rash.  Neurological:  Negative for dizziness and headaches.  Psychiatric/Behavioral:  Negative for agitation and dysphoric mood.        Objective:     BP 122/74   Pulse 76   Temp 98.1 F (36.7 C)   Ht 5\' 7"  (1.702 m)   Wt 224 lb 6.4 oz (101.8 kg)   SpO2 98%   BMI 35.15 kg/m  Wt Readings from Last 3 Encounters:  08/17/22 224 lb 6.4 oz (101.8 kg)  06/26/22 227 lb (103 kg)  06/05/22 227 lb 8 oz (103.2 kg)    Physical Exam Vitals reviewed.  Constitutional:      General: She is not in acute distress.    Appearance: Normal appearance.  HENT:     Head: Normocephalic and atraumatic.     Right Ear: External ear normal.     Left Ear: External ear normal.  Eyes:     General: No scleral icterus.       Right eye: No discharge.        Left eye: No discharge.     Conjunctiva/sclera: Conjunctivae normal.  Neck:     Thyroid: No thyromegaly.  Cardiovascular:     Rate and Rhythm: Normal rate and regular rhythm.  Pulmonary:     Effort: No respiratory distress.     Breath sounds: Normal breath sounds. No wheezing.  Abdominal:     General: Bowel sounds are normal.     Palpations: Abdomen is soft.     Tenderness:  There is no abdominal tenderness.  Musculoskeletal:        General: No swelling or tenderness.     Cervical back: Neck supple. No tenderness.  Lymphadenopathy:     Cervical: No cervical adenopathy.  Skin:    Findings: No erythema or rash.  Neurological:     Mental Status: She is alert.  Psychiatric:        Mood and Affect: Mood normal.        Behavior: Behavior normal.      Outpatient Encounter Medications as of 08/17/2022  Medication Sig   albuterol (VENTOLIN HFA) 108 (90 Base) MCG/ACT inhaler Inhale 2 puffs into the lungs every 6 (six) hours as needed for wheezing or shortness of breath.   estradiol (ESTRACE) 0.1 MG/GM vaginal cream Estrogen Cream Instruction Discard applicator Apply pea sized amount to tip of finger to urethra before bed. Wash hands well after application. Use Monday, Wednesday and Friday   Fluticasone Furoate (ARNUITY ELLIPTA) 100 MCG/ACT AEPB INHALE 1 PUFF INTO THE LUNGS DAILY   [DISCONTINUED] esomeprazole (NEXIUM) 40 MG capsule TAKE 1 CAPSULE DAILY AT 12 NOON   [DISCONTINUED] hydrochlorothiazide (HYDRODIURIL) 25 MG tablet TAKE 1/2 TABLET BY MOUTH EVERY DAY   [DISCONTINUED] losartan (COZAAR) 25 MG tablet TAKE 1 TABLET(25 MG) BY MOUTH DAILY   [DISCONTINUED] rosuvastatin (CRESTOR) 10 MG tablet Take 1 tablet (10 mg total) by mouth daily.   [DISCONTINUED] sertraline (ZOLOFT) 25 MG tablet TAKE 1 TABLET BY MOUTH EVERY DAY   esomeprazole (NEXIUM) 40 MG capsule TAKE 1 CAPSULE DAILY AT 12 NOON   hydrochlorothiazide (HYDRODIURIL) 25 MG tablet Take 0.5 tablets (12.5 mg total) by mouth daily.   losartan (COZAAR) 25 MG tablet TAKE 1 TABLET(25 MG) BY MOUTH DAILY   rosuvastatin (CRESTOR) 10 MG tablet Take 1 tablet (10 mg total) by mouth daily.   sertraline (ZOLOFT) 25 MG tablet Take 1 tablet (25 mg total) by mouth daily.   [DISCONTINUED] CLENPIQ 10-3.5-12 MG-GM -GM/175ML SOLN Take by mouth once.   [DISCONTINUED] Na Sulfate-K Sulfate-Mg Sulf 17.5-3.13-1.6 GM/177ML SOLN See admin  instructions. follow package directions (Patient not taking: Reported on 06/05/2022)   [DISCONTINUED] sulfamethoxazole-trimethoprim (BACTRIM DS) 800-160 MG tablet Take 1 tablet by mouth every 12 (twelve) hours. (Patient not taking: Reported on 07/25/2022)   [DISCONTINUED] trimethoprim (TRIMPEX) 100 MG tablet Take 1 tablet (100 mg total) by mouth daily.   No facility-administered encounter medications on file as of 08/17/2022.     Lab Results  Component Value Date   WBC 6.7 06/05/2022   HGB 11.2 (L) 06/05/2022   HCT 34.6 (L) 06/05/2022   PLT 269 06/05/2022   GLUCOSE 99 08/15/2022   CHOL 130 08/15/2022   TRIG 99.0 08/15/2022   HDL 46.40 08/15/2022   LDLCALC 64 08/15/2022   ALT 12 08/15/2022   AST 14 08/15/2022   NA 140 08/15/2022   K 4.1 08/15/2022   CL 102 08/15/2022   CREATININE 1.01 08/15/2022   BUN 14 08/15/2022   CO2 30 08/15/2022   TSH 3.05 03/22/2022   HGBA1C 6.2 08/15/2022    US RENAL  Result Date: 03/16/2022 CLINICAL DATA:  Recurrent UTI EXAM: RENAL / URINARY TRACT ULTRASOUND COMPLETE COMPARISON:  03/24/2015 FINDINGS: Right Kidney: Renal measurements: 10.6 x 5 x 4 cm = volume: 110 mL. Echogenicity within normal limits. No mass or hydronephrosis visualized. Left Kidney: Renal measurements: 10 x 5 x 5 cm = volume: 130 mL. Echogenicity within normal limits. No mass or hydronephrosis visualized. A few small and simple pelvic cysts are noted. Bladder: Appears normal for degree of bladder distention. IMPRESSION: No hydronephrosis, collection, or renal scarring. Electronically Signed   By: Tiburcio Pea M.D.   On: 03/16/2022 07:33       Assessment & Plan:  Anemia, unspecified type Assessment & Plan: Saw GI.  S/p EGD and colonoscopy.  She canceled capsule study and desires not to pursue further testing at this time.   Concerned about having to swallow.  Follow cbc and iron studies.    Aortic atherosclerosis Assessment & Plan: Continue crestor.  Essential hypertension,  benign Assessment & Plan: On losartan and hctz.  Continue current medication regimen.  Follow pressures.    Orders: -     Basic metabolic panel; Future  Frequent UTI Assessment & Plan: Seeing Dr Apolinar Junes as outlined.  S/p renal ultrasound - ok. Planning for cystoscopy next week.  Completed bactrim.  Per note,  trimethoprim suppression.    Gastroesophageal reflux disease, unspecified whether esophagitis present Assessment & Plan: Nexium.    Hypercholesteremia Assessment & Plan: Continue crestor.  Low cholesterol diet and exercise.  Follow lipid panel and liver function tests.   Orders: -     Lipid panel; Future -     Hepatic function panel; Future  Hyperglycemia Assessment & Plan: Low carb diet and exercise.  Follow met b and a1c.   Orders: -     Hemoglobin A1c; Future  Major depressive disorder, single episode, mild Assessment & Plan: Continue zoloft.  Follow.    OSA (obstructive sleep apnea) Assessment & Plan: Evaluated by pulmonary.  Diagnosed with mild obstructive sleep apnea.  Recommended auto cpap.  Unable to tolerate.  Discussed INSPIRE.    Other orders -     Esomeprazole Magnesium; TAKE 1 CAPSULE DAILY AT 12 NOON  Dispense: 90 capsule; Refill: 2 -     hydroCHLOROthiazide; Take 0.5 tablets (12.5 mg total) by mouth daily.  Dispense: 45 tablet; Refill: 1 -     Losartan Potassium; TAKE 1 TABLET(25 MG) BY MOUTH DAILY  Dispense: 90 tablet; Refill: 2 -     Rosuvastatin Calcium; Take 1 tablet (10 mg total) by mouth daily.  Dispense: 90 tablet; Refill: 2 -     Sertraline HCl; Take 1 tablet (25 mg total) by mouth daily.  Dispense: 90 tablet; Refill: 1     Dale Carson, MD

## 2022-08-21 ENCOUNTER — Encounter: Payer: Self-pay | Admitting: Internal Medicine

## 2022-08-21 NOTE — Assessment & Plan Note (Signed)
Continue zoloft.  Follow.   

## 2022-08-21 NOTE — Assessment & Plan Note (Signed)
Saw GI.  S/p EGD and colonoscopy.  She canceled capsule study and desires not to pursue further testing at this time.   Concerned about having to swallow.  Follow cbc and iron studies.  

## 2022-08-21 NOTE — Assessment & Plan Note (Addendum)
Seeing Dr Apolinar Junes as outlined.  S/p renal ultrasound - ok. Planning for cystoscopy next week.  Completed bactrim.  Per note,  trimethoprim suppression.

## 2022-08-21 NOTE — Assessment & Plan Note (Signed)
Evaluated by pulmonary.  Diagnosed with mild obstructive sleep apnea.  Recommended auto cpap.  Unable to tolerate.  Discussed INSPIRE.  

## 2022-08-21 NOTE — Assessment & Plan Note (Signed)
Continue crestor.  Low cholesterol diet and exercise.  Follow lipid panel and liver function tests.  

## 2022-08-21 NOTE — Assessment & Plan Note (Signed)
On losartan and hctz.  Continue current medication regimen.  Follow pressures.   

## 2022-08-21 NOTE — Assessment & Plan Note (Signed)
Nexium 

## 2022-08-21 NOTE — Assessment & Plan Note (Signed)
Low carb diet and exercise.  Follow met b and a1c.   

## 2022-08-21 NOTE — Assessment & Plan Note (Signed)
Continue crestor 

## 2022-08-22 ENCOUNTER — Encounter: Payer: PPO | Admitting: Speech Pathology

## 2022-08-24 ENCOUNTER — Encounter: Payer: PPO | Admitting: Speech Pathology

## 2022-09-04 ENCOUNTER — Inpatient Hospital Stay: Payer: PPO | Attending: Oncology

## 2022-09-04 DIAGNOSIS — Z800 Family history of malignant neoplasm of digestive organs: Secondary | ICD-10-CM | POA: Diagnosis not present

## 2022-09-04 DIAGNOSIS — Z803 Family history of malignant neoplasm of breast: Secondary | ICD-10-CM | POA: Diagnosis not present

## 2022-09-04 DIAGNOSIS — D509 Iron deficiency anemia, unspecified: Secondary | ICD-10-CM | POA: Insufficient documentation

## 2022-09-04 DIAGNOSIS — Z79899 Other long term (current) drug therapy: Secondary | ICD-10-CM | POA: Insufficient documentation

## 2022-09-04 DIAGNOSIS — Z7189 Other specified counseling: Secondary | ICD-10-CM | POA: Insufficient documentation

## 2022-09-04 DIAGNOSIS — D649 Anemia, unspecified: Secondary | ICD-10-CM

## 2022-09-04 LAB — CBC WITH DIFFERENTIAL/PLATELET
Abs Immature Granulocytes: 0.02 10*3/uL (ref 0.00–0.07)
Basophils Absolute: 0 10*3/uL (ref 0.0–0.1)
Basophils Relative: 0 %
Eosinophils Absolute: 0.2 10*3/uL (ref 0.0–0.5)
Eosinophils Relative: 2 %
HCT: 37.1 % (ref 36.0–46.0)
Hemoglobin: 12.5 g/dL (ref 12.0–15.0)
Immature Granulocytes: 0 %
Lymphocytes Relative: 36 %
Lymphs Abs: 3.2 10*3/uL (ref 0.7–4.0)
MCH: 30.4 pg (ref 26.0–34.0)
MCHC: 33.7 g/dL (ref 30.0–36.0)
MCV: 90.3 fL (ref 80.0–100.0)
Monocytes Absolute: 0.8 10*3/uL (ref 0.1–1.0)
Monocytes Relative: 9 %
Neutro Abs: 4.7 10*3/uL (ref 1.7–7.7)
Neutrophils Relative %: 53 %
Platelets: 256 10*3/uL (ref 150–400)
RBC: 4.11 MIL/uL (ref 3.87–5.11)
RDW: 13.3 % (ref 11.5–15.5)
WBC: 8.9 10*3/uL (ref 4.0–10.5)
nRBC: 0 % (ref 0.0–0.2)

## 2022-09-04 LAB — IRON AND TIBC
Iron: 91 ug/dL (ref 28–170)
Saturation Ratios: 27 % (ref 10.4–31.8)
TIBC: 343 ug/dL (ref 250–450)
UIBC: 252 ug/dL

## 2022-09-04 LAB — FERRITIN: Ferritin: 17 ng/mL (ref 11–307)

## 2022-09-05 ENCOUNTER — Inpatient Hospital Stay (HOSPITAL_BASED_OUTPATIENT_CLINIC_OR_DEPARTMENT_OTHER): Payer: PPO | Admitting: Oncology

## 2022-09-05 ENCOUNTER — Inpatient Hospital Stay: Payer: PPO

## 2022-09-05 ENCOUNTER — Encounter: Payer: Self-pay | Admitting: Oncology

## 2022-09-05 VITALS — BP 139/75 | HR 85 | Temp 96.0°F | Wt 227.6 lb

## 2022-09-05 DIAGNOSIS — D509 Iron deficiency anemia, unspecified: Secondary | ICD-10-CM | POA: Diagnosis not present

## 2022-09-05 NOTE — Progress Notes (Unsigned)
[facility]  [phone] [phone]  ID: Susan Miller OB: May 31, 1945  MR#: [id number]  AOZ#:[id number]  Patient Care Team: Dale [employer], MD as PCP - General (Internal Medicine) Debbe Odea, MD as PCP - Cardiology (Cardiology)  CHIEF COMPLAINT: Iron deficiency anemia.  INTERVAL HISTORY: Patient returns to clinic today for repeat laboratory work, further evaluation, and consideration of additional IV Venofer. She continues to feel well and remains asymptomatic.  She has no neurologic complaints.  She denies any recent fevers or illnesses.  She has a good appetite and denies weight loss.  She has no chest pain, shortness of breath, cough, or hemoptysis.  She denies any nausea, vomiting, constipation, or diarrhea.  She has no melena or hematochezia.  She has no urinary complaints.  Patient offers no specific complaints today.  REVIEW OF SYSTEMS:   Review of Systems  Constitutional: Negative.  Negative for fever, malaise/fatigue and weight loss.  Respiratory: Negative.  Negative for cough, hemoptysis and shortness of breath.   Cardiovascular: Negative.  Negative for chest pain and leg swelling.  Gastrointestinal: Negative.  Negative for abdominal pain, blood in stool and melena.  Genitourinary: Negative.  Negative for hematuria.  Musculoskeletal: Negative.  Negative for back pain.  Skin: Negative.  Negative for rash.  Neurological: Negative.  Negative for dizziness, focal weakness, weakness and headaches.  Psychiatric/Behavioral: Negative.  The patient is not nervous/anxious.     As per HPI. Otherwise, a complete review of systems is negative.  PAST MEDICAL HISTORY: Past Medical History:  Diagnosis Date   Allergy    Depression    History of chicken pox    HLD (hyperlipidemia)    Hx: UTI (urinary tract infection)    Hypertension    Sleep apnea     PAST SURGICAL HISTORY: Past Surgical History:  Procedure Laterality Date    COLONOSCOPY WITH PROPOFOL N/A 06/20/2016   Procedure: COLONOSCOPY WITH PROPOFOL;  Surgeon: Midge Minium, MD;  Location: [facility] ENDOSCOPY;  Service: Endoscopy;  Laterality: N/A;   COLONOSCOPY WITH PROPOFOL N/A 02/14/2022   Procedure: COLONOSCOPY WITH PROPOFOL;  Surgeon: Midge Minium, MD;  Location: [facility] ENDOSCOPY;  Service: Endoscopy;  Laterality: N/A;   ESOPHAGOGASTRODUODENOSCOPY N/A 02/14/2022   Procedure: ESOPHAGOGASTRODUODENOSCOPY (EGD);  Surgeon: Midge Minium, MD;  Location: [facility] ENDOSCOPY;  Service: Endoscopy;  Laterality: N/A;   ESOPHAGOGASTRODUODENOSCOPY (EGD) WITH PROPOFOL N/A 06/20/2016   Procedure: ESOPHAGOGASTRODUODENOSCOPY (EGD) WITH PROPOFOL;  Surgeon: Midge Minium, MD;  Location: [facility] ENDOSCOPY;  Service: Endoscopy;  Laterality: N/A;   TONSILLECTOMY     as a child   TUMOR REMOVAL     behind ear    FAMILY HISTORY: Family History  Problem Relation Age of Onset   Hypertension Mother    Cancer Sister        colon cancer   Hypertension Sister    Diabetes Sister    Hypertension Brother    Hypertension Maternal Aunt    Breast cancer Maternal Aunt 60   Hypertension Maternal Uncle    Stroke Maternal Grandmother    Hypertension Maternal Grandmother     ADVANCED DIRECTIVES (Y/N):  N  HEALTH MAINTENANCE: Social History   Tobacco Use   Smoking status: Never   Smokeless tobacco: Never  Vaping Use   Vaping Use: Never used  Substance Use Topics   Alcohol use: No    Alcohol/week: 0.0 standard drinks of alcohol   Drug use: No     Colonoscopy:  PAP:  Bone density:  Lipid panel:  Allergies  Allergen Reactions   Codeine Hives   Iodinated Contrast Media Swelling   Nitrofurantoin Monohyd Macro Nausea Only    Current Outpatient Medications  Medication Sig Dispense Refill   albuterol (VENTOLIN HFA) 108 (90 Base) MCG/ACT inhaler Inhale 2 puffs into the lungs every 6 (six) hours as needed for wheezing or shortness of breath. 8 g 2   esomeprazole (NEXIUM) 40 MG capsule TAKE  1 CAPSULE DAILY AT 12 NOON 90 capsule 2   estradiol (ESTRACE) 0.1 MG/GM vaginal cream Estrogen Cream Instruction Discard applicator Apply pea sized amount to tip of finger to urethra before bed. Wash hands well after application. Use Monday, Wednesday and Friday 42.5 g 12   Fluticasone Furoate (ARNUITY ELLIPTA) 100 MCG/ACT AEPB INHALE 1 PUFF INTO THE LUNGS DAILY 30 each 5   hydrochlorothiazide (HYDRODIURIL) 25 MG tablet Take 0.5 tablets (12.5 mg total) by mouth daily. 45 tablet 1   losartan (COZAAR) 25 MG tablet TAKE 1 TABLET(25 MG) BY MOUTH DAILY 90 tablet 2   rosuvastatin (CRESTOR) 10 MG tablet Take 1 tablet (10 mg total) by mouth daily. 90 tablet 2   sertraline (ZOLOFT) 25 MG tablet Take 1 tablet (25 mg total) by mouth daily. 90 tablet 1   No current facility-administered medications for this visit.    OBJECTIVE: Vitals:   09/05/22 1410  BP: 139/75  Pulse: 85  Temp: (!) 96 F (35.6 C)  SpO2: 99%     Body mass index is 35.65 kg/m.    ECOG FS:0 - Asymptomatic  General: Well-developed, well-nourished, no acute distress. Eyes: Pink conjunctiva, anicteric sclera. HEENT: Normocephalic, moist mucous membranes. Lungs: No audible wheezing or coughing. Heart: Regular rate and rhythm. Abdomen: Soft, nontender, no obvious distention. Musculoskeletal: No edema, cyanosis, or clubbing. Neuro: Alert, answering all questions appropriately. Cranial nerves grossly intact. Skin: No rashes or petechiae noted. Psych: Normal affect.   LAB RESULTS:  Lab Results  Component Value Date   NA 140 08/15/2022   K 4.1 08/15/2022   CL 102 08/15/2022   CO2 30 08/15/2022   GLUCOSE 99 08/15/2022   BUN 14 08/15/2022   CREATININE 1.01 08/15/2022   CALCIUM 9.9 08/15/2022   PROT 6.9 08/15/2022   ALBUMIN 4.4 08/15/2022   AST 14 08/15/2022   ALT 12 08/15/2022   ALKPHOS 75 08/15/2022   BILITOT 0.5 08/15/2022   GFRNONAA 50 (L) 06/12/2019   GFRAA 58 (L) 06/12/2019    Lab Results  Component Value Date    WBC 8.9 09/04/2022   NEUTROABS 4.7 09/04/2022   HGB 12.5 09/04/2022   HCT 37.1 09/04/2022   MCV 90.3 09/04/2022   PLT 256 09/04/2022   Lab Results  Component Value Date   IRON 91 09/04/2022   TIBC 343 09/04/2022   IRONPCTSAT 27 09/04/2022   Lab Results  Component Value Date   FERRITIN 17 09/04/2022     STUDIES: No results found.  ASSESSMENT: Iron deficiency anemia.  PLAN:    Iron deficiency anemia: Patient's hemoglobin and iron stores are now within normal limits.  Previously, all of her other laboratory work was also either negative or within normal limits.  Colonoscopy and EGD in October 2023 did not reveal any significant pathology.  Patient could not tolerate video endoscopy.  Patient last received IV Venofer on June 15, 2022.  No further intervention is needed.  Return to clinic in 6 months with repeat laboratory work, further evaluation, and continuation of Venofer if needed.    I spent a total of 20  minutes reviewing chart data, face-to-face evaluation with the patient, counseling and coordination of care as detailed above.  Patient expressed understanding and was in agreement with this plan. She also understands that She can call clinic at any time with any questions, concerns, or complaints.    Jeralyn Ruths, MD   09/06/2022 2:55 PM

## 2022-09-05 NOTE — Progress Notes (Signed)
Pt. Isn't abe to swallow pills, and within the last few months pt went to see a speech therapist to see what they could do about it. They told pt. They didn't see anything wrong and sent her on her way.

## 2022-09-06 ENCOUNTER — Encounter: Payer: Self-pay | Admitting: Oncology

## 2022-10-11 ENCOUNTER — Encounter: Payer: Self-pay | Admitting: Internal Medicine

## 2022-10-11 ENCOUNTER — Encounter: Payer: Self-pay | Admitting: Pulmonary Disease

## 2022-10-11 DIAGNOSIS — J45909 Unspecified asthma, uncomplicated: Secondary | ICD-10-CM | POA: Insufficient documentation

## 2022-10-11 DIAGNOSIS — H2513 Age-related nuclear cataract, bilateral: Secondary | ICD-10-CM | POA: Diagnosis not present

## 2022-10-11 DIAGNOSIS — H04123 Dry eye syndrome of bilateral lacrimal glands: Secondary | ICD-10-CM | POA: Diagnosis not present

## 2022-10-11 DIAGNOSIS — H1132 Conjunctival hemorrhage, left eye: Secondary | ICD-10-CM | POA: Diagnosis not present

## 2022-11-09 ENCOUNTER — Other Ambulatory Visit: Payer: Self-pay | Admitting: Pulmonary Disease

## 2022-11-14 ENCOUNTER — Other Ambulatory Visit
Admission: RE | Admit: 2022-11-14 | Discharge: 2022-11-14 | Disposition: A | Discharge status: Home / Self Care | Payer: PPO | Source: Ambulatory Visit | Attending: Nurse Practitioner | Admitting: Nurse Practitioner

## 2022-11-14 ENCOUNTER — Ambulatory Visit: Payer: PPO | Admitting: Nurse Practitioner

## 2022-11-14 ENCOUNTER — Encounter: Payer: Self-pay | Admitting: Nurse Practitioner

## 2022-11-14 VITALS — BP 126/72 | HR 86 | Temp 97.9°F | Ht 62.0 in | Wt 224.8 lb

## 2022-11-14 DIAGNOSIS — J4530 Mild persistent asthma, uncomplicated: Secondary | ICD-10-CM | POA: Diagnosis not present

## 2022-11-14 DIAGNOSIS — J3089 Other allergic rhinitis: Secondary | ICD-10-CM | POA: Diagnosis not present

## 2022-11-14 DIAGNOSIS — J309 Allergic rhinitis, unspecified: Secondary | ICD-10-CM | POA: Insufficient documentation

## 2022-11-14 LAB — NITRIC OXIDE: Nitric Oxide: 24

## 2022-11-14 MED ORDER — MOMETASONE FURO-FORMOTEROL FUM 100-5 MCG/ACT IN AERO
2.0000 | INHALATION_SPRAY | Freq: Two times a day (BID) | RESPIRATORY_TRACT | 5 refills | Status: DC
Start: 2022-11-14 — End: 2022-12-25

## 2022-11-14 MED ORDER — AZELASTINE HCL 0.1 % NA SOLN
2.00 | Freq: Two times a day (BID) | NASAL | 6 refills | Status: AC
Start: 2022-11-14 — End: ?

## 2022-11-14 NOTE — Assessment & Plan Note (Signed)
Mild flare triggered by yard work. Allergy testing today. Add on saline rinses and nasal antihistamine for postnasal drainage control. Limit throat clearing and other measures to worsen upper airway irritation.

## 2022-11-14 NOTE — Progress Notes (Signed)
@Patient  ID: Susan Miller, female    DOB: 11-22-1945, 77 y.o.   MRN: [id number]  Chief Complaint  Patient presents with   Follow-up    SOB with exertion and dry cough.     Referring provider: Dale , MD  HPI: 77 year old female, never smoker followed for chronic cough and mild reactive airways disease. She is a patient of Dr. Jayme Cloud and last seen in office 12/19/2021 by Clent Ridges NP. Past medical history significant for HTN, OSA, GERd, depression, HLD, IDA.   TEST/EVENTS:  05/16/2021 CXR: coarsened interstitial markings. No acute airspace disease 07/08/2021 PFT: FVC 82, FEV1 87, ratio 80, TLC 91, DLCO 81. No bronchodilator response in FEV1; did have midflow reversibility.   12/19/2021: Sudie Grumbling with Clent Ridges NP. Started on flovent at last visit. Doing well. Did notice improvement and is experiencing less wheezing. Uses albuterol once a week for SOB/chest tightness. Heat seems to exacerbate symptoms. Still has some occasional cough, dry. Takes allegra as needed but sometimes makes her sleepy.   11/14/2022: Today - follow up Patient presents today for follow up. Doing relatively similar to last visit. She still has trouble with her cough, which is dry. She does notice it gets worse with increased nasal drainage. She was mowing the grass yesterday and feels like she has more congestion/postnasal drainage today. She still gets shortness of breath with exertion, mainly uphill climbing/strenuous activities. Does fine with household chores. Not noticing much wheezing. She denies any chest congestion, leg swelling, orthopnea, PND. She is using Arnuity daily and albuterol rescue once a week.   FeNO 24 ppb  Allergies  Allergen Reactions   Codeine Hives   Iodinated Contrast Media Swelling   Nitrofurantoin Monohyd Macro Nausea Only    Immunization History  Administered Date(s) Administered   Fluad Quad(high Dose 65+) 01/31/2019, 01/29/2020, 01/25/2022   Influenza, High Dose Seasonal PF 01/11/2016,  04/27/2017, 01/18/2018   Influenza,inj,Quad PF,6+ Mos 02/24/2013, 02/27/2014, 02/22/2015   PFIZER(Purple Top)SARS-COV-2 Vaccination 06/23/2019, 07/14/2019, 01/06/2022   Pneumococcal Conjugate-13 11/06/2016   Pneumococcal Polysaccharide-23 12/06/2017   RSV,unspecified 01/06/2022    Past Medical History:  Diagnosis Date   Allergy    Depression    History of chicken pox    HLD (hyperlipidemia)    Hx: UTI (urinary tract infection)    Hypertension    Sleep apnea     Tobacco History: Social History   Tobacco Use  Smoking Status Never  Smokeless Tobacco Never   Counseling given: Not Answered   Outpatient Medications Prior to Visit  Medication Sig Dispense Refill   albuterol (VENTOLIN HFA) 108 (90 Base) MCG/ACT inhaler INHALE 2 PUFFS INTO THE LUNGS EVERY 6 HOURS AS NEEDED FOR WHEEZING OR SHORTNESS OF BREATH 18 g 2   esomeprazole (NEXIUM) 40 MG capsule TAKE 1 CAPSULE DAILY AT 12 NOON 90 capsule 2   estradiol (ESTRACE) 0.1 MG/GM vaginal cream Estrogen Cream Instruction Discard applicator Apply pea sized amount to tip of finger to urethra before bed. Wash hands well after application. Use Monday, Wednesday and Friday 42.5 g 12   hydrochlorothiazide (HYDRODIURIL) 25 MG tablet Take 0.5 tablets (12.5 mg total) by mouth daily. 45 tablet 1   losartan (COZAAR) 25 MG tablet TAKE 1 TABLET(25 MG) BY MOUTH DAILY 90 tablet 2   rosuvastatin (CRESTOR) 10 MG tablet Take 1 tablet (10 mg total) by mouth daily. 90 tablet 2   sertraline (ZOLOFT) 25 MG tablet Take 1 tablet (25 mg total) by mouth daily. 90 tablet 1  Fluticasone Furoate (ARNUITY ELLIPTA) 100 MCG/ACT AEPB INHALE 1 PUFF INTO THE LUNGS DAILY 30 each 5   No facility-administered medications prior to visit.     Review of Systems:   Constitutional: No weight loss or gain, night sweats, fevers, chills, or lassitude. +occasional fatigue  HEENT: No headaches, difficulty swallowing, tooth/dental problems, or sore throat. No sneezing, itching,  ear ache. +nasal congestion, post nasal drip CV:  No chest pain, orthopnea, PND, swelling in lower extremities, anasarca, dizziness, palpitations, syncope Resp: +shortness of breath with exertion; dry cough. No excess mucus or change in color of mucus. No hemoptysis. No wheezing.  No chest wall deformity GI:  No heartburn, indigestion GU: No dysuria, change in color of urine, urgency or frequency. Skin: No rash, lesions, ulcerations MSK:  No joint pain or swelling.   Neuro: No dizziness or lightheadedness.  Psych: No depression or anxiety. Mood stable.     Physical Exam:  BP 126/72 (BP Location: Left Arm, Cuff Size: Normal)   Pulse 86   Temp 97.9 F (36.6 C) (Temporal)   Ht 5\' 2"  (1.575 m)   Wt 224 lb 12.8 oz (102 kg)   SpO2 97%   BMI 41.12 kg/m   GEN: Pleasant, interactive, well-appearing; obese; in no acute distress. HEENT:  Normocephalic and atraumatic. PERRLA. Sclera white. Nasal turbinates boggy, moist and patent bilaterally. No rhinorrhea present. Oropharynx pink and moist, without exudate or edema. No lesions, ulcerations.  NECK:  Supple w/ fair ROM. No JVD present. Normal carotid impulses w/o bruits. Thyroid symmetrical with no goiter or nodules palpated. No lymphadenopathy.   CV: RRR, no m/r/g, no peripheral edema. Pulses intact, +2 bilaterally. No cyanosis, pallor or clubbing. PULMONARY:  Unlabored, regular breathing. Clear bilaterally A&P w/o wheezes/rales/rhonchi. No accessory muscle use.  GI: BS present and normoactive. Soft, non-tender to palpation. No organomegaly or masses detected.  MSK: No erythema, warmth or tenderness. Cap refil <2 sec all extrem. No deformities or joint swelling noted.  Neuro: A/Ox3. No focal deficits noted.   Skin: Warm, no lesions or rashe Psych: Normal affect and behavior. Judgement and thought content appropriate.     Lab Results:  CBC    Component Value Date/Time   WBC 8.9 09/04/2022 1304   RBC 4.11 09/04/2022 1304   HGB 12.5  09/04/2022 1304   HGB 12.3 04/27/2017 1130   HCT 37.1 09/04/2022 1304   HCT 36.7 04/27/2017 1130   PLT 256 09/04/2022 1304   PLT 294 04/27/2017 1130   MCV 90.3 09/04/2022 1304   MCV 89 04/27/2017 1130   MCH 30.4 09/04/2022 1304   MCHC 33.7 09/04/2022 1304   RDW 13.3 09/04/2022 1304   RDW 13.6 04/27/2017 1130   LYMPHSABS 3.2 09/04/2022 1304   LYMPHSABS 2.9 04/27/2017 1130   MONOABS 0.8 09/04/2022 1304   EOSABS 0.2 09/04/2022 1304   EOSABS 0.2 04/27/2017 1130   BASOSABS 0.0 09/04/2022 1304   BASOSABS 0.0 04/27/2017 1130    BMET    Component Value Date/Time   NA 140 08/15/2022 0904   NA 147 (H) 04/23/2017 0759   K 4.1 08/15/2022 0904   CL 102 08/15/2022 0904   CO2 30 08/15/2022 0904   GLUCOSE 99 08/15/2022 0904   BUN 14 08/15/2022 0904   BUN 19 04/23/2017 0759   CREATININE 1.01 08/15/2022 0904   CALCIUM 9.9 08/15/2022 0904   GFRNONAA 50 (L) 06/12/2019 2030   GFRAA 58 (L) 06/12/2019 2030    BNP    Component Value Date/Time  BNP 90.0 11/09/2018 0849     Imaging:  No results found.        No data to display          Lab Results  Component Value Date   NITRICOXIDE 24 11/14/2022        Assessment & Plan:   Mild reactive airways disease Mild reactive airway disease. She was previously prescribed Breo but unable to afford this so has been on ICS since. She has persistent DOE and some occasional dry cough, which is likely multifactorial with upper airway component. Suspect component of her DOE is related to deconditioning as well. FeNO borderline today at 24 ppb. She does not appear to be in acute exacerbation. We will trial step up to ICS/LABA with [facility], which appears to be on her formulary. She will call if this is expensive. Medication education provided. Action plan in place. Allergy testing today to assess for allergic phenotype. Previous peripheral eosinophils 200.   Patient Instructions  Continue Albuterol inhaler 2 puffs every 6 hours as needed  for shortness of breath or wheezing. Notify if symptoms persist despite rescue inhaler/neb use.  Continue Nexium 1 capsule daily Continue flonase nasal spray 2 sprays each nostril daily  Stop Arnuity. Start Dulera 2 puffs Twice daily. Brush tongue and rinse mouth afterwards  Add on astelin nasal spray 2 sprays each nostril Twice daily  Saline nasal rinses 1-2 times a day. Use about 20-30 minutes before your nasal sprays   Labs today   Follow up in 6 weeks with Dr. Jayme Cloud or Katie Sakai Heinle,NP. If symptoms do not improve or worsen, please contact office for sooner follow up or seek emergency care.    Allergic rhinitis Mild flare triggered by yard work. Allergy testing today. Add on saline rinses and nasal antihistamine for postnasal drainage control. Limit throat clearing and other measures to worsen upper airway irritation.    I spent 35 minutes of dedicated to the care of this patient on the date of this encounter to include pre-visit review of records, face-to-face time with the patient discussing conditions above, post visit ordering of testing, clinical documentation with the electronic health record, making appropriate referrals as documented, and communicating necessary findings to members of the patients care team.  Noemi Chapel, NP 11/14/2022  Pt aware and understands NP's role.

## 2022-11-14 NOTE — Patient Instructions (Addendum)
Continue Albuterol inhaler 2 puffs every 6 hours as needed for shortness of breath or wheezing. Notify if symptoms persist despite rescue inhaler/neb use.  Continue Nexium 1 capsule daily Continue flonase nasal spray 2 sprays each nostril daily  Stop Arnuity. Start Dulera 2 puffs Twice daily. Brush tongue and rinse mouth afterwards  Add on astelin nasal spray 2 sprays each nostril Twice daily  Saline nasal rinses 1-2 times a day. Use about 20-30 minutes before your nasal sprays   Labs today   Follow up in 6 weeks with Dr. Jayme Cloud or Katie Pinkie Manger,NP. If symptoms do not improve or worsen, please contact office for sooner follow up or seek emergency care.

## 2022-11-14 NOTE — Assessment & Plan Note (Signed)
Mild reactive airway disease. She was previously prescribed Breo but unable to afford this so has been on ICS since. She has persistent DOE and some occasional dry cough, which is likely multifactorial with upper airway component. Suspect component of her DOE is related to deconditioning as well. FeNO borderline today at 24 ppb. She does not appear to be in acute exacerbation. We will trial step up to ICS/LABA with [facility], which appears to be on her formulary. She will call if this is expensive. Medication education provided. Action plan in place. Allergy testing today to assess for allergic phenotype. Previous peripheral eosinophils 200.   Patient Instructions  Continue Albuterol inhaler 2 puffs every 6 hours as needed for shortness of breath or wheezing. Notify if symptoms persist despite rescue inhaler/neb use.  Continue Nexium 1 capsule daily Continue flonase nasal spray 2 sprays each nostril daily  Stop Arnuity. Start Dulera 2 puffs Twice daily. Brush tongue and rinse mouth afterwards  Add on astelin nasal spray 2 sprays each nostril Twice daily  Saline nasal rinses 1-2 times a day. Use about 20-30 minutes before your nasal sprays   Labs today   Follow up in 6 weeks with Dr. Jayme Cloud or Katie Charmin Aguiniga,NP. If symptoms do not improve or worsen, please contact office for sooner follow up or seek emergency care.

## 2022-11-15 NOTE — Progress Notes (Signed)
Agree with the details of the visit as noted by Katherine Cobb, NP. ° °C. Susan Willow Reczek, MD °Advanced Bronchoscopy °[facility] ° °

## 2022-11-17 LAB — ALLERGEN PANEL (27) + IGE
Alternaria Alternata IgE: <0.1 kU/L
Aspergillus Fumigatus IgE: <0.1 kU/L
Bahia Grass IgE: <0.1 kU/L
Bermuda Grass IgE: <0.1 kU/L
Cat Dander IgE: <0.1 kU/L
Cedar, Mountain IgE: <0.1 kU/L
Cladosporium Herbarum IgE: <0.1 kU/L
Cocklebur IgE: <0.1 kU/L
Cockroach, American IgE: <0.1 kU/L
Common Silver Birch IgE: <0.1 kU/L
D Farinae IgE: <0.1 kU/L
D Pteronyssinus IgE: <0.1 kU/L
Dog Dander IgE: <0.1 kU/L
Elm, American IgE: <0.1 kU/L
Hickory, White IgE: <0.1 kU/L
IgE (Immunoglobulin E), Serum: 12 IU/mL (ref 6–495)
Johnson Grass IgE: <0.1 kU/L
Kentucky Bluegrass IgE: <0.1 kU/L
Maple/Box Elder IgE: <0.1 kU/L
Mucor Racemosus IgE: <0.1 kU/L
Oak, White IgE: <0.1 kU/L
Penicillium Chrysogen IgE: <0.1 kU/L
Pigweed, Rough IgE: <0.1 kU/L
Plantain, English IgE: <0.1 kU/L
Ragweed, Short IgE: <0.1 kU/L
Setomelanomma Rostrat: <0.1 kU/L
Timothy Grass IgE: <0.1 kU/L
White Mulberry IgE: <0.1 kU/L

## 2022-11-20 NOTE — Progress Notes (Signed)
Allergen panel was negative. Thanks

## 2022-11-28 ENCOUNTER — Other Ambulatory Visit: Payer: Self-pay | Admitting: Internal Medicine

## 2022-12-04 ENCOUNTER — Telehealth: Payer: Self-pay

## 2022-12-04 ENCOUNTER — Other Ambulatory Visit (HOSPITAL_COMMUNITY): Payer: Self-pay

## 2022-12-04 NOTE — Telephone Encounter (Signed)
*  Pulm  Pharmacy Patient Advocate Encounter   Received notification from CoverMyMeds that prior authorization for [facility] 100-5MCG/ACT aerosol  is required/requested.   Insurance verification completed.   The patient is insured through [employer]/ [employer] .   Per test claim: PA required; PA submitted to [employer]/ [employer] via CoverMyMeds Key/confirmation #/EOC [id number] Status is pending  Pharmacy Patient Advocate Encounter  Received notification from [employer]/ [employer] that Prior Authorization for [facility] 100-5MCG/ACT aerosol  has been APPROVED from 12/04/2022 to 12/04/2023. Ran test claim, Copay is $100.00.  PA #/Case ID/Reference #: [id number]

## 2022-12-06 ENCOUNTER — Encounter (INDEPENDENT_AMBULATORY_CARE_PROVIDER_SITE_OTHER): Payer: Self-pay

## 2022-12-08 ENCOUNTER — Ambulatory Visit
Admission: RE | Admit: 2022-12-08 | Discharge: 2022-12-08 | Disposition: A | Discharge status: Home / Self Care | Payer: PPO | Source: Ambulatory Visit | Attending: Family Medicine | Admitting: Family Medicine

## 2022-12-08 VITALS — BP 151/78 | HR 72 | Temp 97.8°F | Resp 16

## 2022-12-08 DIAGNOSIS — L309 Dermatitis, unspecified: Secondary | ICD-10-CM | POA: Diagnosis not present

## 2022-12-08 MED ORDER — PREDNISONE 20 MG PO TABS: 20.0000 mg | ORAL_TABLET | Freq: Every day | ORAL | 0 refills | Status: AC

## 2022-12-08 MED ORDER — FLUCONAZOLE 150 MG PO TABS: 150.0000 mg | ORAL_TABLET | ORAL | 0 refills | Status: DC

## 2022-12-08 NOTE — ED Provider Notes (Signed)
Susan Miller    CSN: [id number] Arrival date & time: 12/08/22  1528      History   Chief Complaint Chief Complaint  Patient presents with   Mouth Lesions    Mouth thrush on face - Entered by patient    HPI Susan Miller is a 77 y.o. female.   HPI Patient presents today with concerns for possible thrush. She has a rash on the skin folds bilateral sides of mouth x 4 days. She uses a chronic steriodal inhaler and recently finished antibiotics and concerned that his current symptoms maybe related to these recent treatments. She has tried hydrocortisone cream for the rash without improvement. She is not having any symptoms inside of her mouth. Past Medical History:  Diagnosis Date   Allergy    Depression    History of chicken pox    HLD (hyperlipidemia)    Hx: UTI (urinary tract infection)    Hypertension    Sleep apnea     Patient Active Problem List   Diagnosis Date Noted   Allergic rhinitis 11/14/2022   Mild reactive airways disease 10/11/2022   Polyp of ascending colon    Knee pain, right 12/13/2021   Major depressive disorder, single episode, mild (HCC) 12/11/2021   Iron deficiency anemia, unspecified 04/10/2021   Aortic atherosclerosis (HCC) 04/10/2021   UTI (urinary tract infection) 03/27/2021   Cutaneous skin tags 12/12/2020   B12 deficiency 12/12/2020   OSA (obstructive sleep apnea) 04/18/2020   Numbness and tingling of both feet 02/20/2020   Daytime hypersomnolence 01/29/2020   Dysuria 12/07/2019   Snoring 12/07/2019   Hypercholesteremia 10/09/2019   Back pain 11/15/2018   SOB (shortness of breath) 11/15/2018   Decreased GFR 11/14/2018   Hyperbilirubinemia 11/14/2018   Abdominal bloating 12/09/2017   Osteopenia 04/29/2017   Hyperglycemia 04/27/2017   Reflux esophagitis    Heartburn    Special screening for malignant neoplasms, colon    Hair loss 10/26/2015   Fatigue 10/21/2015   Vitamin D deficiency 03/15/2015   Weight loss counseling,  encounter for 02/22/2015   Facial erythema 07/27/2014   Health care maintenance 07/05/2014   BP (high blood pressure) 03/26/2014   Headache 03/08/2014   Eyelid lesion 03/08/2014   Shingles 11/16/2013   Light headedness 06/22/2013   Cough 05/18/2013   GERD (gastroesophageal reflux disease) 05/18/2013   Swelling of extremity 03/01/2013   Left knee pain 03/01/2013   Essential hypertension, benign 12/15/2012   Frequent UTI 12/15/2012   Mild depression 12/15/2012   Environmental allergies 12/15/2012    Past Surgical History:  Procedure Laterality Date   COLONOSCOPY WITH PROPOFOL N/A 06/20/2016   Procedure: COLONOSCOPY WITH PROPOFOL;  Surgeon: Midge Minium, MD;  Location: [facility] ENDOSCOPY;  Service: Endoscopy;  Laterality: N/A;   COLONOSCOPY WITH PROPOFOL N/A 02/14/2022   Procedure: COLONOSCOPY WITH PROPOFOL;  Surgeon: Midge Minium, MD;  Location: [facility] ENDOSCOPY;  Service: Endoscopy;  Laterality: N/A;   ESOPHAGOGASTRODUODENOSCOPY N/A 02/14/2022   Procedure: ESOPHAGOGASTRODUODENOSCOPY (EGD);  Surgeon: Midge Minium, MD;  Location: [facility] ENDOSCOPY;  Service: Endoscopy;  Laterality: N/A;   ESOPHAGOGASTRODUODENOSCOPY (EGD) WITH PROPOFOL N/A 06/20/2016   Procedure: ESOPHAGOGASTRODUODENOSCOPY (EGD) WITH PROPOFOL;  Surgeon: Midge Minium, MD;  Location: [facility] ENDOSCOPY;  Service: Endoscopy;  Laterality: N/A;   TONSILLECTOMY     as a child   TUMOR REMOVAL     behind ear    OB History   No obstetric history on file.      Home Medications    Prior  to Admission medications   Medication Sig Start Date End Date Taking? Authorizing Provider  fluconazole (DIFLUCAN) 150 MG tablet Take 1 tablet (150 mg total) by mouth every 3 (three) days. Repeat if needed 12/08/22  Yes Bing Neighbors, NP  predniSONE (DELTASONE) 20 MG tablet Take 1 tablet (20 mg total) by mouth daily with breakfast for 5 days. 12/08/22 12/13/22 Yes Bing Neighbors, NP  albuterol (VENTOLIN HFA) 108 (90 Base) MCG/ACT inhaler INHALE 2  PUFFS INTO THE LUNGS EVERY 6 HOURS AS NEEDED FOR WHEEZING OR SHORTNESS OF BREATH 11/10/22   Salena Saner, MD  azelastine (ASTELIN) 0.1 % nasal spray Place 2 sprays into both nostrils 2 (two) times daily. Use in each nostril as directed 11/14/22   Cobb, Ruby Cola, NP  esomeprazole (NEXIUM) 40 MG capsule TAKE 1 CAPSULE DAILY AT 12 NOON 08/17/22   Dale [place], MD  estradiol (ESTRACE) 0.1 MG/GM vaginal cream Estrogen Cream Instruction Discard applicator Apply pea sized amount to tip of finger to urethra before bed. Wash hands well after application. Use Monday, Wednesday and Friday 06/23/21   Vanna [place], MD  hydrochlorothiazide (HYDRODIURIL) 25 MG tablet TAKE 1/2 TABLET(12.5 MG) BY MOUTH DAILY 11/29/22   Dale [place], MD  losartan (COZAAR) 25 MG tablet TAKE 1 TABLET(25 MG) BY MOUTH DAILY 08/17/22   Dale [place], MD  mometasone-formoterol [facility]) 100-5 MCG/ACT AERO Inhale 2 puffs into the lungs in the morning and at bedtime. 11/14/22   Cobb, Ruby Cola, NP  rosuvastatin (CRESTOR) 10 MG tablet Take 1 tablet (10 mg total) by mouth daily. 08/17/22   Dale [place], MD  sertraline (ZOLOFT) 25 MG tablet Take 1 tablet (25 mg total) by mouth daily. 08/17/22   Dale [place], MD    Family History Family History  Problem Relation Age of Onset   Hypertension Mother    Cancer Sister        colon cancer   Hypertension Sister    Diabetes Sister    Hypertension Brother    Hypertension Maternal Aunt    Breast cancer Maternal Aunt 60   Hypertension Maternal Uncle    Stroke Maternal Grandmother    Hypertension Maternal Grandmother     Social History Social History   Tobacco Use   Smoking status: Never   Smokeless tobacco: Never  Vaping Use   Vaping status: Never Used  Substance Use Topics   Alcohol use: No    Alcohol/week: 0.0 standard drinks of alcohol   Drug use: No     Allergies   Codeine, Iodinated contrast media, and Nitrofurantoin monohyd macro   Review of Systems Review  of Systems Pertinent negatives listed in HPI   Physical Exam Triage Vital Signs ED Triage Vitals [12/08/22 1635]  Encounter Vitals Group     BP (!) 151/78     Systolic BP Percentile      Diastolic BP Percentile      Pulse Rate 72     Resp 16     Temp 97.8 F (36.6 C)     Temp Source Temporal     SpO2 95 %     Weight      Height      Head Circumference      Peak Flow      Pain Score 0     Pain Loc      Pain Education      Exclude from Growth Chart    No data found.  Updated Vital Signs BP (!) 151/78 (BP  Location: Left Arm)   Pulse 72   Temp 97.8 F (36.6 C) (Temporal)   Resp 16   SpO2 95%   Visual Acuity Right Eye Distance:   Left Eye Distance:   Bilateral Distance:    Right Eye Near:   Left Eye Near:    Bilateral Near:     Physical Exam Vitals reviewed.  Constitutional:      Appearance: Normal appearance.  HENT:     Head: Normocephalic and atraumatic.   Eyes:     Extraocular Movements: Extraocular movements intact.     Conjunctiva/sclera: Conjunctivae normal.     Pupils: Pupils are equal, round, and reactive to light.  Cardiovascular:     Rate and Rhythm: Normal rate and regular rhythm.  Pulmonary:     Effort: Pulmonary effort is normal.     Breath sounds: Normal breath sounds.  Skin:    Findings: Erythema and rash present.  Neurological:     General: No focal deficit present.     Mental Status: She is alert.      UC Treatments / Results  Labs (all labs ordered are listed, but only abnormal results are displayed) Labs Reviewed - No data to display  EKG   Radiology No results found.  Procedures Procedures (including critical care time)  Medications Ordered in UC Medications - No data to display  Initial Impression / Assessment and Plan / UC Course  I have reviewed the triage vital signs and the nursing notes.  Pertinent labs & imaging results that were available during my care of the patient were reviewed by me and considered in  my medical decision making (see chart for details).    Dermatitis of face, given close proximity of rash to mouth, opted to avoid topical treatment. Rash appearance is consistent with a dermatitis and patient has secondary facial swelling on the right side of mouth. Treatment per discharge medication orders. Return precaution given if symptoms worsen or do not improve. Final Clinical Impressions(s) / UC Diagnoses   Final diagnoses:  Dermatitis of face   Discharge Instructions   None    ED Prescriptions     Medication Sig Dispense Auth. Provider   predniSONE (DELTASONE) 20 MG tablet Take 1 tablet (20 mg total) by mouth daily with breakfast for 5 days. 5 tablet Bing Neighbors, NP   fluconazole (DIFLUCAN) 150 MG tablet Take 1 tablet (150 mg total) by mouth every 3 (three) days. Repeat if needed 3 tablet Bing Neighbors, NP      PDMP not reviewed this encounter.   Bing Neighbors, NP 12/08/22 [phone]

## 2022-12-08 NOTE — ED Triage Notes (Signed)
Patient presents to UC for possible thrush. States she developed a rash on corner of right side of mouth x 4 days ago. She states she finished two course of antibiotics and uses an inhaler. Treating rash with hydrocortisone cream with no improvement.

## 2022-12-13 ENCOUNTER — Telehealth: Payer: Self-pay

## 2022-12-13 ENCOUNTER — Ambulatory Visit
Admission: EM | Admit: 2022-12-13 | Discharge: 2022-12-13 | Disposition: A | Discharge status: Home / Self Care | Payer: PPO | Attending: Emergency Medicine | Admitting: Emergency Medicine

## 2022-12-13 DIAGNOSIS — R102 Pelvic and perineal pain: Secondary | ICD-10-CM | POA: Diagnosis not present

## 2022-12-13 DIAGNOSIS — R350 Frequency of micturition: Secondary | ICD-10-CM | POA: Diagnosis not present

## 2022-12-13 LAB — POCT URINALYSIS DIP (MANUAL ENTRY)
Bilirubin, UA: NEGATIVE
Glucose, UA: NEGATIVE mg/dL
Ketones, POC UA: NEGATIVE mg/dL
Nitrite, UA: NEGATIVE
Protein Ur, POC: NEGATIVE mg/dL
Spec Grav, UA: 1.015 (ref 1.010–1.025)
Urobilinogen, UA: 0.2 E.U./dL
pH, UA: 7 (ref 5.0–8.0)

## 2022-12-13 MED ORDER — CEPHALEXIN 250 MG/5ML PO SUSR: 500.0000 mg | Freq: Three times a day (TID) | ORAL | 0 refills | Status: AC

## 2022-12-13 NOTE — Telephone Encounter (Signed)
Patient called complaining of UTI symptoms.  Left message for patient to call and schedule appointment.

## 2022-12-13 NOTE — ED Provider Notes (Signed)
Susan Miller    CSN: [id number] Arrival date & time: 12/13/22  0909      History   Chief Complaint No chief complaint on file.   HPI Susan Miller is a 77 y.o. female.   Patient presents for evaluation of urinary frequency, intermittent lower abdominal pressure present for 1 day.  Believes symptoms began after a large bowel movement that occurred after a bout of constipation.  Has attempted to increase fluid intake with water and use of probiotics.  Denies hematuria, dysuria, flank pain, fevers, vaginal symptoms.  Past Medical History:  Diagnosis Date   Allergy    Depression    History of chicken pox    HLD (hyperlipidemia)    Hx: UTI (urinary tract infection)    Hypertension    Sleep apnea     Patient Active Problem List   Diagnosis Date Noted   Allergic rhinitis 11/14/2022   Mild reactive airways disease 10/11/2022   Polyp of ascending colon    Knee pain, right 12/13/2021   Major depressive disorder, single episode, mild (HCC) 12/11/2021   Iron deficiency anemia, unspecified 04/10/2021   Aortic atherosclerosis (HCC) 04/10/2021   UTI (urinary tract infection) 03/27/2021   Cutaneous skin tags 12/12/2020   B12 deficiency 12/12/2020   OSA (obstructive sleep apnea) 04/18/2020   Numbness and tingling of both feet 02/20/2020   Daytime hypersomnolence 01/29/2020   Dysuria 12/07/2019   Snoring 12/07/2019   Hypercholesteremia 10/09/2019   Back pain 11/15/2018   SOB (shortness of breath) 11/15/2018   Decreased GFR 11/14/2018   Hyperbilirubinemia 11/14/2018   Abdominal bloating 12/09/2017   Osteopenia 04/29/2017   Hyperglycemia 04/27/2017   Reflux esophagitis    Heartburn    Special screening for malignant neoplasms, colon    Hair loss 10/26/2015   Fatigue 10/21/2015   Vitamin D deficiency 03/15/2015   Weight loss counseling, encounter for 02/22/2015   Facial erythema 07/27/2014   Health care maintenance 07/05/2014   BP (high blood pressure) 03/26/2014    Headache 03/08/2014   Eyelid lesion 03/08/2014   Shingles 11/16/2013   Light headedness 06/22/2013   Cough 05/18/2013   GERD (gastroesophageal reflux disease) 05/18/2013   Swelling of extremity 03/01/2013   Left knee pain 03/01/2013   Essential hypertension, benign 12/15/2012   Frequent UTI 12/15/2012   Mild depression 12/15/2012   Environmental allergies 12/15/2012    Past Surgical History:  Procedure Laterality Date   COLONOSCOPY WITH PROPOFOL N/A 06/20/2016   Procedure: COLONOSCOPY WITH PROPOFOL;  Surgeon: Midge Minium, MD;  Location: [facility] ENDOSCOPY;  Service: Endoscopy;  Laterality: N/A;   COLONOSCOPY WITH PROPOFOL N/A 02/14/2022   Procedure: COLONOSCOPY WITH PROPOFOL;  Surgeon: Midge Minium, MD;  Location: [facility] ENDOSCOPY;  Service: Endoscopy;  Laterality: N/A;   ESOPHAGOGASTRODUODENOSCOPY N/A 02/14/2022   Procedure: ESOPHAGOGASTRODUODENOSCOPY (EGD);  Surgeon: Midge Minium, MD;  Location: [facility] ENDOSCOPY;  Service: Endoscopy;  Laterality: N/A;   ESOPHAGOGASTRODUODENOSCOPY (EGD) WITH PROPOFOL N/A 06/20/2016   Procedure: ESOPHAGOGASTRODUODENOSCOPY (EGD) WITH PROPOFOL;  Surgeon: Midge Minium, MD;  Location: [facility] ENDOSCOPY;  Service: Endoscopy;  Laterality: N/A;   TONSILLECTOMY     as a child   TUMOR REMOVAL     behind ear    OB History   No obstetric history on file.      Home Medications    Prior to Admission medications   Medication Sig Start Date End Date Taking? Authorizing Provider  cephALEXin (KEFLEX) 250 MG/5ML suspension Take 10 mLs (500 mg total) by mouth 3 (  three) times daily for 5 days. 12/13/22 12/18/22 Yes ,  R, NP  albuterol (VENTOLIN HFA) 108 (90 Base) MCG/ACT inhaler INHALE 2 PUFFS INTO THE LUNGS EVERY 6 HOURS AS NEEDED FOR WHEEZING OR SHORTNESS OF BREATH 11/10/22   Salena Saner, MD  azelastine (ASTELIN) 0.1 % nasal spray Place 2 sprays into both nostrils 2 (two) times daily. Use in each nostril as directed 11/14/22   Cobb, Ruby Cola, NP   esomeprazole (NEXIUM) 40 MG capsule TAKE 1 CAPSULE DAILY AT 12 NOON 08/17/22   Dale [place], MD  estradiol (ESTRACE) 0.1 MG/GM vaginal cream Estrogen Cream Instruction Discard applicator Apply pea sized amount to tip of finger to urethra before bed. Wash hands well after application. Use Monday, Wednesday and Friday 06/23/21   Vanna [place], MD  fluconazole (DIFLUCAN) 150 MG tablet Take 1 tablet (150 mg total) by mouth every 3 (three) days. Repeat if needed 12/08/22   Bing Neighbors, NP  hydrochlorothiazide (HYDRODIURIL) 25 MG tablet TAKE 1/2 TABLET(12.5 MG) BY MOUTH DAILY 11/29/22   Dale [place], MD  losartan (COZAAR) 25 MG tablet TAKE 1 TABLET(25 MG) BY MOUTH DAILY 08/17/22   Dale [place], MD  mometasone-formoterol [facility]) 100-5 MCG/ACT AERO Inhale 2 puffs into the lungs in the morning and at bedtime. 11/14/22   Cobb, Ruby Cola, NP  predniSONE (DELTASONE) 20 MG tablet Take 1 tablet (20 mg total) by mouth daily with breakfast for 5 days. 12/08/22 12/13/22  Bing Neighbors, NP  rosuvastatin (CRESTOR) 10 MG tablet Take 1 tablet (10 mg total) by mouth daily. 08/17/22   Dale [place], MD  sertraline (ZOLOFT) 25 MG tablet Take 1 tablet (25 mg total) by mouth daily. 08/17/22   Dale [place], MD    Family History Family History  Problem Relation Age of Onset   Hypertension Mother    Cancer Sister        colon cancer   Hypertension Sister    Diabetes Sister    Hypertension Brother    Hypertension Maternal Aunt    Breast cancer Maternal Aunt 60   Hypertension Maternal Uncle    Stroke Maternal Grandmother    Hypertension Maternal Grandmother     Social History Social History   Tobacco Use   Smoking status: Never   Smokeless tobacco: Never  Vaping Use   Vaping status: Never Used  Substance Use Topics   Alcohol use: No    Alcohol/week: 0.0 standard drinks of alcohol   Drug use: No     Allergies   Codeine, Iodinated contrast media, and Nitrofurantoin monohyd  macro   Review of Systems Review of Systems  Constitutional: Negative.   HENT: Negative.    Respiratory: Negative.    Cardiovascular: Negative.   Gastrointestinal: Negative.   Genitourinary:  Positive for frequency and pelvic pain. Negative for decreased urine volume, difficulty urinating, dyspareunia, dysuria, enuresis, flank pain, genital sores, hematuria, menstrual problem, urgency, vaginal bleeding, vaginal discharge and vaginal pain.  Musculoskeletal: Negative.      Physical Exam Triage Vital Signs ED Triage Vitals [12/13/22 0934]  Encounter Vitals Group     BP 132/74     Systolic BP Percentile      Diastolic BP Percentile      Pulse Rate 67     Resp 18     Temp 98.1 F (36.7 C)     Temp Source Oral     SpO2 95 %     Weight      Height  Head Circumference      Peak Flow      Pain Score      Pain Loc      Pain Education      Exclude from Growth Chart    No data found.  Updated Vital Signs BP 132/74 (BP Location: Left Arm)   Pulse 67   Temp 98.1 F (36.7 C) (Oral)   Resp 18   SpO2 95%   Visual Acuity Right Eye Distance:   Left Eye Distance:   Bilateral Distance:    Right Eye Near:   Left Eye Near:    Bilateral Near:     Physical Exam Constitutional:      Appearance: Normal appearance.  Eyes:     Extraocular Movements: Extraocular movements intact.  Pulmonary:     Effort: Pulmonary effort is normal.  Abdominal:     General: Abdomen is flat. Bowel sounds are normal. There is no distension.     Palpations: Abdomen is soft.     Tenderness: There is no abdominal tenderness. There is no right CVA tenderness, left CVA tenderness or guarding.  Neurological:     Mental Status: She is alert and oriented to person, place, and time. Mental status is at baseline.      UC Treatments / Results  Labs (all labs ordered are listed, but only abnormal results are displayed) Labs Reviewed  POCT URINALYSIS DIP (MANUAL ENTRY) - Abnormal; Notable for the  following components:      Result Value   Blood, UA small (*)    Leukocytes, UA Large (3+) (*)    All other components within normal limits  URINE CULTURE    EKG   Radiology No results found.  Procedures Procedures (including critical care time)  Medications Ordered in UC Medications - No data to display  Initial Impression / Assessment and Plan / UC Course  I have reviewed the triage vital signs and the nursing notes.  Pertinent labs & imaging results that were available during my care of the patient were reviewed by me and considered in my medical decision making (see chart for details).  Acute suprapubic pain, urinary frequency  No abdominal tenderness noted, vital signs are stable patient has no signs of distress nontoxic-appearing, urinalysis showing leukocytes, negative for nitrates, sent for culture, as patient is symptomatic will prophylactically provide antibiotics, cephalexin sent to pharmacy, discussed administration, recommended continued use of supportive care, advised follow-up with urgent care or PCP if symptoms continue to persist recur or worsen Final Clinical Impressions(s) / UC Diagnoses   Final diagnoses:  Urinary frequency  Suprapubic pain, acute     Discharge Instructions      Your urinalysis shows  blood cells which fight infection but does not currently show bacteria , your urine will be sent to the lab to determine exactly which bacteria is present, if any changes need to be made to your medications you will be notified  Begin use of keflex every 8 hours for 5 days  Increase your fluid intake through use of water  Continue use of probiotics  As always practice good hygiene, wiping front to back and avoidance of scented vaginal products to prevent further irritation  If symptoms continue to persist after use of medication or recur please follow-up with urgent care or your primary doctor as needed    ED Prescriptions     Medication  Sig Dispense Auth. Provider   cephALEXin (KEFLEX) 250 MG/5ML suspension Take 10 mLs (500 mg total)  by mouth 3 (three) times daily for 5 days. 150 mL Valinda Hoar, NP      PDMP not reviewed this encounter.   Valinda Hoar, NP 12/13/22 1000

## 2022-12-13 NOTE — Discharge Instructions (Addendum)
Your urinalysis shows  blood cells which fight infection but does not currently show bacteria , your urine will be sent to the lab to determine exactly which bacteria is present, if any changes need to be made to your medications you will be notified  Begin use of keflex every 8 hours for 5 days  Increase your fluid intake through use of water  Continue use of probiotics  As always practice good hygiene, wiping front to back and avoidance of scented vaginal products to prevent further irritation  If symptoms continue to persist after use of medication or recur please follow-up with urgent care or your primary doctor as needed

## 2022-12-13 NOTE — ED Triage Notes (Signed)
Provider triage  

## 2022-12-15 ENCOUNTER — Other Ambulatory Visit (INDEPENDENT_AMBULATORY_CARE_PROVIDER_SITE_OTHER): Payer: PPO

## 2022-12-15 DIAGNOSIS — I10 Essential (primary) hypertension: Secondary | ICD-10-CM | POA: Diagnosis not present

## 2022-12-15 DIAGNOSIS — E7800 Pure hypercholesterolemia, unspecified: Secondary | ICD-10-CM

## 2022-12-15 DIAGNOSIS — R739 Hyperglycemia, unspecified: Secondary | ICD-10-CM | POA: Diagnosis not present

## 2022-12-15 LAB — HEPATIC FUNCTION PANEL
ALT: 13 U/L (ref 0–35)
AST: 15 U/L (ref 0–37)
Albumin: 4.1 g/dL (ref 3.5–5.2)
Alkaline Phosphatase: 69 U/L (ref 39–117)
Bilirubin, Direct: 0.1 mg/dL (ref 0.0–0.3)
Total Bilirubin: 0.4 mg/dL (ref 0.2–1.2)
Total Protein: 6.8 g/dL (ref 6.0–8.3)

## 2022-12-15 LAB — LIPID PANEL
Cholesterol: 125 mg/dL (ref 0–200)
HDL: 45.6 mg/dL (ref >39.00)
LDL Cholesterol: 56 mg/dL (ref 0–99)
NonHDL: 78.93
Total CHOL/HDL Ratio: 3
Triglycerides: 113 mg/dL (ref 0.0–149.0)
VLDL: 22.6 mg/dL (ref 0.0–40.0)

## 2022-12-15 LAB — BASIC METABOLIC PANEL
BUN: 20 mg/dL (ref 6–23)
CO2: 31 mEq/L (ref 19–32)
Calcium: 9.9 mg/dL (ref 8.4–10.5)
Chloride: 103 mEq/L (ref 96–112)
Creatinine, Ser: 1.19 mg/dL (ref 0.40–1.20)
GFR: 44.33 mL/min — ABNORMAL LOW (ref >60.00)
Glucose, Bld: 111 mg/dL — ABNORMAL HIGH (ref 70–99)
Potassium: 4.7 mEq/L (ref 3.5–5.1)
Sodium: 141 mEq/L (ref 135–145)

## 2022-12-15 LAB — HEMOGLOBIN A1C: Hgb A1c MFr Bld: 6.4 % (ref 4.6–6.5)

## 2022-12-19 ENCOUNTER — Telehealth: Payer: Self-pay | Admitting: Nurse Practitioner

## 2022-12-19 ENCOUNTER — Ambulatory Visit: Payer: PPO | Admitting: Internal Medicine

## 2022-12-19 ENCOUNTER — Encounter: Payer: Self-pay | Admitting: Oncology

## 2022-12-19 NOTE — Telephone Encounter (Signed)
Pt. Calling Susan Miller has a high copay want to see what else could be prescribed so is more cost efficient

## 2022-12-20 NOTE — Telephone Encounter (Signed)
ATC the patient. LVM for patient to return my call. 

## 2022-12-21 ENCOUNTER — Other Ambulatory Visit: Payer: PPO

## 2022-12-21 NOTE — Telephone Encounter (Signed)
Spoke to patient.  She stated that Elwin Sleight is not affordable. I have suggested that she contact insurance and request a list of cheaper alternatives.  She will call back with update.

## 2022-12-21 NOTE — Telephone Encounter (Signed)
Pt. Called back to speak with nurse

## 2022-12-21 NOTE — Telephone Encounter (Signed)
I called patient while she was on hold with our office. She will call insurance and call back with update.

## 2022-12-22 ENCOUNTER — Encounter: Payer: Self-pay | Admitting: Internal Medicine

## 2022-12-22 ENCOUNTER — Ambulatory Visit (INDEPENDENT_AMBULATORY_CARE_PROVIDER_SITE_OTHER): Payer: PPO | Admitting: Internal Medicine

## 2022-12-22 VITALS — BP 128/78 | HR 77 | Temp 98.2°F | Resp 16 | Ht 67.0 in | Wt 224.0 lb

## 2022-12-22 DIAGNOSIS — E538 Deficiency of other specified B group vitamins: Secondary | ICD-10-CM

## 2022-12-22 DIAGNOSIS — D509 Iron deficiency anemia, unspecified: Secondary | ICD-10-CM

## 2022-12-22 DIAGNOSIS — F320 Major depressive disorder, single episode, mild: Secondary | ICD-10-CM

## 2022-12-22 DIAGNOSIS — I10 Essential (primary) hypertension: Secondary | ICD-10-CM

## 2022-12-22 DIAGNOSIS — D649 Anemia, unspecified: Secondary | ICD-10-CM

## 2022-12-22 DIAGNOSIS — I700 Atherosclerosis of aorta: Secondary | ICD-10-CM | POA: Diagnosis not present

## 2022-12-22 DIAGNOSIS — M79671 Pain in right foot: Secondary | ICD-10-CM

## 2022-12-22 DIAGNOSIS — K219 Gastro-esophageal reflux disease without esophagitis: Secondary | ICD-10-CM | POA: Diagnosis not present

## 2022-12-22 DIAGNOSIS — E7800 Pure hypercholesterolemia, unspecified: Secondary | ICD-10-CM

## 2022-12-22 DIAGNOSIS — Z1231 Encounter for screening mammogram for malignant neoplasm of breast: Secondary | ICD-10-CM

## 2022-12-22 DIAGNOSIS — G4733 Obstructive sleep apnea (adult) (pediatric): Secondary | ICD-10-CM

## 2022-12-22 DIAGNOSIS — J683 Other acute and subacute respiratory conditions due to chemicals, gases, fumes and vapors: Secondary | ICD-10-CM

## 2022-12-22 DIAGNOSIS — N390 Urinary tract infection, site not specified: Secondary | ICD-10-CM

## 2022-12-22 DIAGNOSIS — R739 Hyperglycemia, unspecified: Secondary | ICD-10-CM

## 2022-12-22 DIAGNOSIS — J4530 Mild persistent asthma, uncomplicated: Secondary | ICD-10-CM

## 2022-12-22 DIAGNOSIS — R21 Rash and other nonspecific skin eruption: Secondary | ICD-10-CM

## 2022-12-22 MED ORDER — PIMECROLIMUS 1 % EX CREA: TOPICAL_CREAM | Freq: Two times a day (BID) | CUTANEOUS | 0 refills | Status: DC

## 2022-12-22 NOTE — Progress Notes (Unsigned)
Subjective:    [name], female    DOB: [birth year], 77 y.o.   MRN: [id number]  Patient here for a scheduled follow up.   HPI Here for follow up appt - f/u hypertension. On losartan and hydrochlorothiazide. Saw Dr Apolinar Junes 03/08/22 - w/up for frequent UTIs. Had renal ultrasound 03/2022 - ok.  Cystoscopy 04/05/22 - unremarkable. Placed on suppression abx. Recently evaluated urgent care 12/13/22 - treated for UTI with keflex. Saw Dr Orlie [name] - 09/05/22 - anemia.  Suspect IDA. Colonoscopy and EGD in October 2023 did not reveal any significant pathology. Patient could not tolerate video endoscopy. S/p IV venofer.  Last check - hgb and iron studies wnl. Recommended f/u in 6 months. Had f/u with pulmonary 11/14/22 - f/u mild reactive airways disease.  Recommended dulera and allergy testing. Also continues on albuterol prn and nexium. Also recommended continuing flonase and starting astelin.    Past Medical History:  Diagnosis Date   Allergy    Depression    History of chicken pox    HLD (hyperlipidemia)    Hx: UTI (urinary tract infection)    Hypertension    Sleep apnea    Past Surgical History:  Procedure Laterality Date   COLONOSCOPY WITH PROPOFOL N/A 06/20/2016   Procedure: COLONOSCOPY WITH PROPOFOL;  Surgeon: Midge Minium, MD;  Location: [facility] ENDOSCOPY;  Service: Endoscopy;  Laterality: N/A;   COLONOSCOPY WITH PROPOFOL N/A 02/14/2022   Procedure: COLONOSCOPY WITH PROPOFOL;  Surgeon: Midge Minium, MD;  [facility] ENDOSCOPY;  Service: Endoscopy;  Laterality: N/A;   ESOPHAGOGASTRODUODENOSCOPY N/A 02/14/2022   Procedure: ESOPHAGOGASTRODUODENOSCOPY (EGD);  Surgeon: Midge Minium, MD;  Location: [facility] ENDOSCOPY;  Service: Endoscopy;  Laterality: N/A;   ESOPHAGOGASTRODUODENOSCOPY (EGD) WITH PROPOFOL N/A 06/20/2016   Procedure: ESOPHAGOGASTRODUODENOSCOPY (EGD) WITH PROPOFOL;  Surgeon: Midge Minium, MD;  Location: [facility] ENDOSCOPY;  Service: Endoscopy;  Laterality: N/A;   TONSILLECTOMY      as a child   TUMOR REMOVAL     behind ear   Family History  Problem Relation Age of Onset   Hypertension Mother    Cancer Sister        colon cancer   Hypertension Sister    Diabetes Sister    Hypertension Brother    Hypertension Maternal Aunt    Breast cancer Maternal Aunt 60   Hypertension Maternal Uncle    Stroke Maternal Grandmother    Hypertension Maternal Grandmother    Social History   Socioeconomic History   Marital status: Divorced    Spouse name: Not on file   Number of children: Not on file   Years of education: Not on file   Highest education level: Associate degree: occupational, Scientist, product/process development, or vocational program  Occupational History   Not on file  Tobacco Use   Smoking status: Never   Smokeless tobacco: Never  Vaping Use   Vaping status: Never Used  Substance and Sexual Activity   Alcohol use: No    Alcohol/week: 0.0 standard drinks of alcohol   Drug use: No   Sexual activity: Not Currently  Other Topics Concern   Not on file  Social History Narrative   Not on file   Social Determinants of Health   Financial Resource Strain: Medium Risk (08/13/2022)   Overall Financial Resource Strain (CARDIA)    Difficulty of Paying Living Expenses: Somewhat hard  Food Insecurity: Food Insecurity Present (08/13/2022)   Hunger Vital Sign    Worried About Running Out of Food in the Last Year:  Sometimes true    Ran Out of Food in the Last Year: Never true  Transportation Needs: No Transportation Needs (08/13/2022)   PRAPARE - Administrator, Civil Service (Medical): No    Lack of Transportation (Non-Medical): No  Physical Activity: Inactive (08/13/2022)   Exercise Vital Sign    Days of Exercise per Week: 0 days    Minutes of Exercise per Session: 0 min  Stress: Stress Concern Present (08/13/2022)   [employer] of Occupational Health - Occupational Stress Questionnaire    Feeling of Stress : To some extent  Social Connections: Socially Isolated  (08/13/2022)   Social Connection and Isolation Panel [NHANES]    Frequency of Communication with Friends and Family: Three times a week    Frequency of Social Gatherings with Friends and Family: Once a week    Attends Religious Services: Never    Database administrator or Organizations: No    Attends Engineer, structural: Never    Marital Status: Divorced     Review of Systems     Objective:     BP 128/78   Pulse 77   Temp 98.2 F (36.8 C)   Resp 16   Ht 5\' 7"  (1.702 m)   Wt 224 lb (101.6 kg)   SpO2 98%   BMI 35.08 kg/m  Wt Readings from Last 3 Encounters:  12/22/22 224 lb (101.6 kg)  11/14/22 224 lb 12.8 oz (102 kg)  09/05/22 227 lb 9.6 oz (103.2 kg)    Physical Exam   Outpatient Encounter Medications as of 12/22/2022  Medication Sig   pimecrolimus (ELIDEL) 1 % cream Apply topically 2 (two) times daily.   albuterol (VENTOLIN HFA) 108 (90 Base) MCG/ACT inhaler INHALE 2 PUFFS INTO THE LUNGS EVERY 6 HOURS AS NEEDED FOR WHEEZING OR SHORTNESS OF BREATH   azelastine (ASTELIN) 0.1 % nasal spray Place 2 sprays into both nostrils 2 (two) times daily. Use in each nostril as directed   esomeprazole (NEXIUM) 40 MG capsule TAKE 1 CAPSULE DAILY AT 12 NOON   estradiol (ESTRACE) 0.1 MG/GM vaginal cream Estrogen Cream Instruction Discard applicator Apply pea sized amount to tip of finger to urethra before bed. Wash hands well after application. Use Monday, Wednesday and Friday   fluconazole (DIFLUCAN) 150 MG tablet Take 1 tablet (150 mg total) by mouth every 3 (three) days. Repeat if needed   hydrochlorothiazide (HYDRODIURIL) 25 MG tablet TAKE 1/2 TABLET(12.5 MG) BY MOUTH DAILY   losartan (COZAAR) 25 MG tablet TAKE 1 TABLET(25 MG) BY MOUTH DAILY   mometasone-formoterol (DULERA) 100-5 MCG/ACT AERO Inhale 2 puffs into the lungs in the morning and at bedtime.   rosuvastatin (CRESTOR) 10 MG tablet Take 1 tablet (10 mg total) by mouth daily.   sertraline (ZOLOFT) 25 MG tablet Take 1  tablet (25 mg total) by mouth daily.   No facility-administered encounter medications on file as of 12/22/2022.     Lab Results  Component Value Date   WBC 8.9 09/04/2022   HGB 12.5 09/04/2022   HCT 37.1 09/04/2022   PLT 256 09/04/2022   GLUCOSE 111 (H) 12/15/2022   CHOL 125 12/15/2022   TRIG 113.0 12/15/2022   HDL 45.60 12/15/2022   LDLCALC 56 12/15/2022   ALT 13 12/15/2022   AST 15 12/15/2022   NA 141 12/15/2022   K 4.7 12/15/2022   CL 103 12/15/2022   CREATININE 1.19 12/15/2022   BUN 20 12/15/2022   CO2 31 12/15/2022  TSH 3.05 03/22/2022   HGBA1C 6.4 12/15/2022    No results found.     Assessment & Plan:  Encounter for screening mammogram for malignant neoplasm of breast -     3D Screening Mammogram, Left and Right; Future  Hypercholesteremia -     Lipid panel; Future -     Hepatic function panel; Future -     Basic metabolic panel; Future  Hyperglycemia -     Hemoglobin A1c; Future  Anemia, unspecified type -     TSH; Future  Other orders -     Pimecrolimus; Apply topically 2 (two) times daily.  Dispense: 30 g; Refill: 0     Dale Sarepta, MD

## 2022-12-23 ENCOUNTER — Encounter: Payer: Self-pay | Admitting: Internal Medicine

## 2022-12-23 DIAGNOSIS — R21 Rash and other nonspecific skin eruption: Secondary | ICD-10-CM | POA: Insufficient documentation

## 2022-12-23 DIAGNOSIS — J683 Other acute and subacute respiratory conditions due to chemicals, gases, fumes and vapors: Secondary | ICD-10-CM | POA: Insufficient documentation

## 2022-12-23 DIAGNOSIS — M79673 Pain in unspecified foot: Secondary | ICD-10-CM | POA: Insufficient documentation

## 2022-12-23 NOTE — Assessment & Plan Note (Signed)
Had f/u with pulmonary 11/14/22 - f/u mild reactive airways disease.  Recommended dulera and allergy testing.  Unable to afford dulera.  Back in arnuity now. Also continues on albuterol prn and nexium. Also recommended continuing flonase and starting astelin. Breathing relatively stable.  Discussed pt assistance for dulera.

## 2022-12-23 NOTE — Assessment & Plan Note (Signed)
Continue crestor.  Low cholesterol diet and exercise.  Follow lipid panel and liver function tests.  

## 2022-12-23 NOTE — Assessment & Plan Note (Signed)
started having issues with her right foot. Increased pain - bottom of foot - especially when first gets out of bed or has been sitting. Discussed plantar fasciitis. Discussed stretches and supports.  She has an appt with [facility] in 2 weeks.

## 2022-12-23 NOTE — Assessment & Plan Note (Signed)
Recheck b12 level with next labs.   °

## 2022-12-23 NOTE — Assessment & Plan Note (Signed)
Continue crestor 

## 2022-12-23 NOTE — Assessment & Plan Note (Signed)
Saw Dr Apolinar Junes 03/08/22 - w/up for frequent UTIs. Had renal ultrasound 03/2022 - ok.  Renal ultrasound unremarkable. Cystoscopy 04/05/22 - unremarkable. Placed on suppression abx.

## 2022-12-23 NOTE — Assessment & Plan Note (Signed)
Saw Dr Orlie [name] - 09/05/22 - anemia.  Suspect IDA. Colonoscopy and EGD in October 2023 did not reveal any significant pathology. Patient could not tolerate video endoscopy. S/p IV venofer.  Last check - hgb and iron studies wnl. Recommended f/u in 6 months.

## 2022-12-23 NOTE — Assessment & Plan Note (Signed)
Nexium 

## 2022-12-23 NOTE — Assessment & Plan Note (Signed)
Persistent.  Peri oral.  Recently treated with prednisone and diflucan. Discussed not licking lips.  Trial of elidel.  May need dermatology referral.

## 2022-12-23 NOTE — Assessment & Plan Note (Signed)
Evaluated by pulmonary.  Diagnosed with mild obstructive sleep apnea.  Recommended auto cpap.  Unable to tolerate.  Discussed [facility]. Agreeable for referral.

## 2022-12-23 NOTE — Assessment & Plan Note (Signed)
On losartan and hctz.  Continue current medication regimen.  Follow pressures.   

## 2022-12-23 NOTE — Assessment & Plan Note (Signed)
Had f/u with pulmonary 11/14/22 - f/u mild reactive airways disease.  Recommended dulera and allergy testing.  Unable to afford dulera.  Back in arnuity now. Also continues on albuterol prn and nexium. Also recommended continuing flonase and starting astelin. Breathing relatively stable.

## 2022-12-23 NOTE — Assessment & Plan Note (Signed)
Continue zoloft.  Stable.  Follow.   

## 2022-12-23 NOTE — Assessment & Plan Note (Signed)
Low carb diet and exercise.  Follow met b and a1c.   

## 2022-12-25 ENCOUNTER — Other Ambulatory Visit: Payer: Self-pay

## 2022-12-25 ENCOUNTER — Telehealth: Payer: Self-pay

## 2022-12-25 MED ORDER — FLUTICASONE-SALMETEROL 250-50 MCG/ACT IN AEPB: 1.0000 | INHALATION_SPRAY | Freq: Two times a day (BID) | RESPIRATORY_TRACT | 5 refills | Status: DC

## 2022-12-25 NOTE — Telephone Encounter (Signed)
Dr. Jayme Cloud, please advise. Katie had switched her from [place] to [place] on 11/14/2022. The [facility] cost too much. Below are a list of covered alternatives.  Generic Advair Diskus/Wixela-$5.00 Advair HFA-$47.00 Breo-$47.00

## 2022-12-25 NOTE — Progress Notes (Signed)
   Care Guide Note  12/25/2022 Name: Susan Miller MRN: [id number] DOB: [birth year]  Referred by: Dale [place], MD Reason for referral : Care Management (Outreach to schedule with Pharm d )   Susan Miller is a 77 y.o. year old female who is a primary care patient of Dale Mahtomedi, MD. Susan Miller was referred to the pharmacist for assistance related to COPD.    Successful contact was made with the patient to discuss pharmacy services including being ready for the pharmacist to call at least 5 minutes before the scheduled appointment time, to have medication bottles and any blood sugar or blood pressure readings ready for review. The patient agreed to meet with the pharmacist via with the pharmacist via telephone visit on (date/time).  02/01/2023  Penne Lash, RMA Care Guide [facility]  [place], [place] [address] Direct Dial: [phone] [email or web address]

## 2022-12-25 NOTE — Telephone Encounter (Signed)
Per test claims the covered alternatives are:   Generic Advair Diskus/Wixela-$5.00 Advair HFA-$47.00 Breo-$47.00

## 2022-12-25 NOTE — Telephone Encounter (Signed)
Lets try generic Advair 250/50 1 puff twice a day.  Rinse mouth well after use.

## 2022-12-25 NOTE — Telephone Encounter (Signed)
Please see 12/24/2022 mychart message.

## 2022-12-25 NOTE — Telephone Encounter (Signed)
Can you check with her insurance company and see what a cheaper alterative to the [facility] would be? Thank you!

## 2022-12-26 ENCOUNTER — Encounter: Payer: Self-pay | Admitting: Urology

## 2022-12-26 ENCOUNTER — Ambulatory Visit: Payer: PPO | Admitting: Urology

## 2022-12-26 ENCOUNTER — Encounter: Payer: Self-pay | Admitting: Internal Medicine

## 2022-12-26 VITALS — BP 125/78 | HR 97 | Wt 227.6 lb

## 2022-12-26 DIAGNOSIS — R3915 Urgency of urination: Secondary | ICD-10-CM | POA: Diagnosis not present

## 2022-12-26 DIAGNOSIS — N390 Urinary tract infection, site not specified: Secondary | ICD-10-CM | POA: Diagnosis not present

## 2022-12-26 DIAGNOSIS — N3001 Acute cystitis with hematuria: Secondary | ICD-10-CM

## 2022-12-26 DIAGNOSIS — Z87440 Personal history of urinary (tract) infections: Secondary | ICD-10-CM

## 2022-12-26 DIAGNOSIS — R1030 Lower abdominal pain, unspecified: Secondary | ICD-10-CM

## 2022-12-26 DIAGNOSIS — R82998 Other abnormal findings in urine: Secondary | ICD-10-CM | POA: Diagnosis not present

## 2022-12-26 DIAGNOSIS — R300 Dysuria: Secondary | ICD-10-CM | POA: Diagnosis not present

## 2022-12-26 DIAGNOSIS — R350 Frequency of micturition: Secondary | ICD-10-CM | POA: Diagnosis not present

## 2022-12-26 LAB — URINALYSIS, COMPLETE
Bilirubin, UA: NEGATIVE
Ketones, UA: NEGATIVE
Nitrite, UA: POSITIVE — AB
Specific Gravity, UA: 1.01 (ref 1.005–1.030)
Urobilinogen, Ur: 2 mg/dL — ABNORMAL HIGH (ref 0.2–1.0)
pH, UA: 6 (ref 5.0–7.5)

## 2022-12-26 LAB — MICROSCOPIC EXAMINATION: WBC, UA: >30 /hpf — AB (ref 0–5)

## 2022-12-26 LAB — BLADDER SCAN AMB NON-IMAGING

## 2022-12-26 MED ORDER — SULFAMETHOXAZOLE-TRIMETHOPRIM 800-160 MG PO TABS
1.0000 | ORAL_TABLET | Freq: Two times a day (BID) | ORAL | 0 refills | Status: DC
Start: 2022-12-26 — End: 2023-02-14

## 2022-12-26 NOTE — Telephone Encounter (Signed)
Spoke with patient and scheduled appointment for today with Dr Apolinar Junes at 1:45 pm

## 2022-12-26 NOTE — Progress Notes (Signed)
I,Amy L Pierron,acting as a scribe for Vanna Scotland, MD.,have documented all relevant documentation on the behalf of Vanna Scotland, MD,as directed by  Vanna Scotland, MD while in the presence of Vanna Scotland, MD.  12/26/2022 2:39 PM   Susan Miller 1946/04/25 [id number]  Referring provider: Dale Benicia, MD [address] [place],  [place] [address]  Chief Complaint  Patient presents with   Dysuria    HPI: 77 year-old female with a personal history of recurrent UTI's presents today with concern she has a urinary tract infection now.  She was initially seen and evaluated for recurrent UTI's quite some time ago. Her last Cysto was in November 2023, which showed no pathology. After that appointment she was placed on suppression for 90 days. She's also supposed to be on cranberry tablets, probiotics, topical estrogen, and D-mannose.   So she was seen and evaluated in the emergency room on 12/13/2022 with one day of urinary frequency and low abdominal pressure. Her urinalysis was suspicious. She was started on Keflex TID for five days. The urine culture ended up growing E. coli resistant to Ampicillin, but sensitive to Keflex. She has no other documented infections since her last visit here.  Her urine today is grossly positive with greater than 30 white blood cells, 11 to 30 red blood cells, many bacteria, nitrate positive. She's having urgency frequency and painful urination, lower abdominal pain.  She mentions she doesn't think the most recent UTI completely went away since it started. She states about a month ago having a large sized bowel movement about a week prior to the symptoms starting.   She was recently put on an anti-fungal and a steroid for a mouth rash.  She is still using the estrogen cream. She is not able to swallow pills.   Results for orders placed or performed in visit on 12/26/22  Bladder Scan (Post Void Residual) in office  Result Value  Ref Range   Scan Result 23ml     PMH: Past Medical History:  Diagnosis Date   Allergy    Depression    History of chicken pox    HLD (hyperlipidemia)    Hx: UTI (urinary tract infection)    Hypertension    Sleep apnea     Surgical History: Past Surgical History:  Procedure Laterality Date   COLONOSCOPY WITH PROPOFOL N/A 06/20/2016   Procedure: COLONOSCOPY WITH PROPOFOL;  Surgeon: Midge Minium, MD;  Location: [facility] ENDOSCOPY;  Service: Endoscopy;  Laterality: N/A;   COLONOSCOPY WITH PROPOFOL N/A 02/14/2022   Procedure: COLONOSCOPY WITH PROPOFOL;  Surgeon: Midge Minium, MD;  Location: [facility] ENDOSCOPY;  Service: Endoscopy;  Laterality: N/A;   ESOPHAGOGASTRODUODENOSCOPY N/A 02/14/2022   Procedure: ESOPHAGOGASTRODUODENOSCOPY (EGD);  Surgeon: Midge Minium, MD;  Location: [facility] ENDOSCOPY;  Service: Endoscopy;  Laterality: N/A;   ESOPHAGOGASTRODUODENOSCOPY (EGD) WITH PROPOFOL N/A 06/20/2016   Procedure: ESOPHAGOGASTRODUODENOSCOPY (EGD) WITH PROPOFOL;  Surgeon: Midge Minium, MD;  Location: [facility] ENDOSCOPY;  Service: Endoscopy;  Laterality: N/A;   TONSILLECTOMY     as a child   TUMOR REMOVAL     behind ear    Home Medications:  Allergies as of 12/26/2022       Reactions   Codeine Hives   Iodinated Contrast Media Swelling   Nitrofurantoin Monohyd Macro Nausea Only        Medication List        Accurate as of December 26, 2022  2:39 PM. If you have any questions, ask your nurse or  doctor.          albuterol 108 (90 Base) MCG/ACT inhaler Commonly known as: VENTOLIN HFA INHALE 2 PUFFS INTO THE LUNGS EVERY 6 HOURS AS NEEDED FOR WHEEZING OR SHORTNESS OF BREATH   azelastine 0.1 % nasal spray Commonly known as: ASTELIN Place 2 sprays into both nostrils 2 (two) times daily. Use in each nostril as directed   esomeprazole 40 MG capsule Commonly known as: NEXIUM TAKE 1 CAPSULE DAILY AT 12 NOON   estradiol 0.1 MG/GM vaginal cream Commonly known as: ESTRACE Estrogen Cream  Instruction Discard applicator Apply pea sized amount to tip of finger to urethra before bed. Wash hands well after application. Use Monday, Wednesday and Friday   fluconazole 150 MG tablet Commonly known as: DIFLUCAN Take 1 tablet (150 mg total) by mouth every 3 (three) days. Repeat if needed   fluticasone-salmeterol 250-50 MCG/ACT Aepb Commonly known as: Wixela Inhub Inhale 1 puff into the lungs in the morning and at bedtime.   hydrochlorothiazide 25 MG tablet Commonly known as: HYDRODIURIL TAKE 1/2 TABLET(12.5 MG) BY MOUTH DAILY   losartan 25 MG tablet Commonly known as: COZAAR TAKE 1 TABLET(25 MG) BY MOUTH DAILY   pimecrolimus 1 % cream Commonly known as: Elidel Apply topically 2 (two) times daily.   rosuvastatin 10 MG tablet Commonly known as: CRESTOR Take 1 tablet (10 mg total) by mouth daily.   sertraline 25 MG tablet Commonly known as: ZOLOFT Take 1 tablet (25 mg total) by mouth daily.   sulfamethoxazole-trimethoprim 800-160 MG tablet Commonly known as: BACTRIM DS Take 1 tablet by mouth 2 (two) times daily. Started by: Vanna Scotland        Allergies:  Allergies  Allergen Reactions   Codeine Hives   Iodinated Contrast Media Swelling   Nitrofurantoin Monohyd Macro Nausea Only    Family History: Family History  Problem Relation Age of Onset   Hypertension Mother    Cancer Sister        colon cancer   Hypertension Sister    Diabetes Sister    Hypertension Brother    Hypertension Maternal Aunt    Breast cancer Maternal Aunt 60   Hypertension Maternal Uncle    Stroke Maternal Grandmother    Hypertension Maternal Grandmother     Social History:  reports that she has never smoked. She has never used smokeless tobacco. She reports that she does not drink alcohol and does not use drugs.   Physical Exam: BP 125/78   Pulse 97   Wt 227 lb 9.6 oz (103.2 kg)   BMI 35.65 kg/m   Constitutional:  Alert and oriented, No acute distress. HEENT: [place] AT,  moist mucus membranes.  Trachea midline, no masses. Neurologic: Grossly intact, no focal deficits, moving all 4 extremities. Psychiatric: Normal mood and affect.   Assessment & Plan:    Acute cystitis  - Suspect this is an incompletely treated infection. Urine culture sent, awaiting results.  - Encouraged to continue estrogen, cranberry, probiotic, D-mannose, etc.  - Prescribed one week dose of Bactrim. She prefers liquid over pills.  Return if symptoms worsen or fail to improve.   [facility] [address] [place], [place] [address] [phone]

## 2022-12-27 ENCOUNTER — Ambulatory Visit: Payer: PPO | Admitting: Podiatry

## 2022-12-27 ENCOUNTER — Ambulatory Visit (INDEPENDENT_AMBULATORY_CARE_PROVIDER_SITE_OTHER): Payer: PPO

## 2022-12-27 ENCOUNTER — Encounter: Payer: Self-pay | Admitting: Podiatry

## 2022-12-27 DIAGNOSIS — L309 Dermatitis, unspecified: Secondary | ICD-10-CM | POA: Diagnosis not present

## 2022-12-27 DIAGNOSIS — M722 Plantar fascial fibromatosis: Secondary | ICD-10-CM

## 2022-12-27 NOTE — Progress Notes (Signed)
  Subjective:  [name], female    DOB: Dec 18, 1945,  MRN: [id number]  Chief Complaint  Patient presents with   Foot Pain    "I have a sore spot on the bottom of my heel." N - heel pain L - right D - 3 weeks O - suddenly, gotten worse C - sharp pain, stone bruise, walking barefoot A - getting up during the night to go to bathroom T - wear shoes with Orthopedic soles, brace    77 y.o. female presents with the above complaint. History confirmed with patient.   Objective:  Physical Exam: warm, good capillary refill, no trophic changes or ulcerative lesions, normal DP and PT pulses, and normal sensory exam. Left Foot: normal exam, no swelling, tenderness, instability; ligaments intact, full range of motion of all ankle/foot joints Right Foot: point tenderness over the heel pad  No images are attached to the encounter.  Radiographs: Multiple views x-ray of the right foot: no fracture, dislocation, swelling or degenerative changes noted, plantar calcaneal spur, and posterior calcaneal spur Assessment:   1. Plantar fasciitis of right foot      Plan:  Patient was evaluated and treated and all questions answered.  Discussed the etiology and treatment options for plantar fasciitis including stretching, formal physical therapy, supportive shoegears such as a running shoe or sneaker, pre fabricated orthoses, injection therapy, and oral medications. We also discussed the role of surgical treatment of this for patients who do not improve after exhausting non-surgical treatment options.   -XR reviewed with patient -Educated patient on stretching and icing of the affected limb -Injection delivered to the plantar fascia of the right foot.  After sterile prep with povidone-iodine solution and alcohol, the right heel was injected with 0.5cc 2% xylocaine plain, 0.5cc 0.5% marcaine plain, 5mg  triamcinolone acetonide, and 2mg  dexamethasone was injected along the medial plantar  fascia at the insertion on the plantar calcaneus. The patient tolerated the procedure well without complication.  Return if symptoms worsen or fail to improve.

## 2022-12-27 NOTE — Patient Instructions (Signed)

## 2022-12-29 LAB — CULTURE, URINE COMPREHENSIVE

## 2023-01-09 ENCOUNTER — Other Ambulatory Visit (HOSPITAL_COMMUNITY): Payer: Self-pay

## 2023-01-09 ENCOUNTER — Telehealth: Payer: Self-pay

## 2023-01-09 NOTE — Telephone Encounter (Signed)
*  Primary  Pharmacy Patient Advocate Encounter   Received notification from Fax that prior authorization for Pimecrolimus 1% cream  is required/requested.   Insurance verification completed.   The patient is insured through [employer]/ [employer] .   Per test claim: PA required; PA submitted to [employer]/ [employer] via CoverMyMeds Key/confirmation #/EOC [facility] Status is pending

## 2023-01-16 ENCOUNTER — Encounter: Payer: Self-pay | Admitting: Pharmacist

## 2023-01-16 NOTE — Telephone Encounter (Signed)
Pharmacy Patient Advocate Encounter  Received notification from [employer]/ [employer] that Prior Authorization for Pimecrolimus 1% cream has been DENIED.  Full denial letter will be uploaded to the media tab. See denial reason below.  Plan requirements for the approval of a non-formulary medication are: Diagnosis of a medically-accepted indication, AND; The patient has tried and failed at least one formulary alternative,  OR; The prescriber can submit a supporting statement to explain why the formulary drug(s) are not appropriate for the patient. FDA-Approved indications for the use of this medication include: Atopic dermatitis. Your request was denied because it is being used for an indication which is NOT approved or medically-accepted: Rash and other nonspecific skin eruption.  PA #/Case ID/Reference #: [id number]  Please be advised we currently do not have a Pharmacist to review denials, therefore you will need to process appeals accordingly as needed. Thanks for your support at this time. Contact for appeals are as follows: Phone: [phone], Fax: [phone]

## 2023-01-17 NOTE — Telephone Encounter (Signed)
Called patient to confirm if needed because this was given as something acute. Pt says she does not need this medication at that she got an appt with dermatology so she is good. Will let us know if she needs anything further.

## 2023-01-24 DIAGNOSIS — M170 Bilateral primary osteoarthritis of knee: Secondary | ICD-10-CM | POA: Diagnosis not present

## 2023-01-24 DIAGNOSIS — M25561 Pain in right knee: Secondary | ICD-10-CM | POA: Diagnosis not present

## 2023-01-30 ENCOUNTER — Other Ambulatory Visit: Payer: PPO

## 2023-01-30 ENCOUNTER — Other Ambulatory Visit: Payer: PPO | Admitting: Pharmacist

## 2023-01-30 NOTE — Progress Notes (Signed)
01/30/2023 Name: Susan Miller MRN: [id number] DOB: [birth year]  Subjective  Chief Complaint  Patient presents with   Medication Access    Reason for visit: Susan Miller is a 77 y.o. year old female who presented for a telephone visit.   They were referred to the pharmacist by their PCP for assistance in managing medication access and Reactive airway disease .   Care Team: Primary Care Provider: Dale , MD ; Next Scheduled Visit: 04/25/23 Pulmonologist: Salena Saner, MD - Next visit scheduled with NP on 02/14/23.   Reason for visit: ?  Susan Miller is a 77 y.o. female who presents today for a phone visit with the pharmacist due to medication access concerns regarding Dulera inhaler. ?   Medication Access: ?  Prescription drug coverage: Payor: [employer] / Plan: [employer] PPO / Product Type: *No Product type* / .   Reports that her copay on inhalers is high and would appreciate any assistance. She used Wixela for ~4 days and reports constant coughing with it (increased from usual baseline). She changed back to the Arnuity about a week ago and cough has decreased but still present. Previously tolerated Arnuity with no worsening of her baseline cough.  $100 copay for the first inhaler (cannot recall which inhaler). Patient then received letter from insurance stating they would not cover additional fills. Arnuity is ~$50/month which is more affordable for her. All other medications are affordable.   Patient lives in a household of 1 with an estimated income of $2100 per month via SS. Medicare LIS Eligible: No , monthly income exceeds cutoff   Current Patient Assistance: None  Assessment and Plan:   1. Medication Access Cost of Elwin Sleight was not affordable for patient. She tried [employer] for a short time which she felt significantly increased her coughing so she has since switched back to Arnuity resulting in some improvement in her cough. Confirms  good adherence. Confirms rinsing mouth after every use. Denies sx other than persistent cough. There is currently no patient assistance program for [facility]. Since insurance copay is too costly, could consider transition to different inhaler(s) as below: Combination LABA/ICS with MAP program: Breo Ellipta (however, the MAP program has a $600 OOP spend requirement which patient is unsure if she has reached.   Could consider an alternative inhaler from [facility] or AZ&Me MAP programs given no spend requirement for enrollment, though their inhalers are more geared toward COPD.  Patient lives in a household of 1 with an estimated annual income of $2100/month via SSI and therefore may be eligible for MAP.  Patient is not eligible for copay cards due to government insurance. Patient to call [facility] for a summary of drug expenses for 2024 to see if she has reached $600 spend requirement for [employer] MAP (Breo Ellipta).  Patient to discuss with pulmonology team at upcoming 02/14/23 office visit.  Visit note forwarded to pulmonology team.     Inhalers with MAP programs:  AZ&Me: 300% FPL LAMA/LABA/ICS    Breztri (glycopyrrolate/formoterol/budesonide)  BI Cares: 200% FPL LAMA    Spiriva (tiotropium) LAMA/LABA    Stioloto (tiotropium/olodaterol) SAMA/SABA    Combivent (ipratropium/albuterol)  GSK: 300% FPL (Medicare D patients must reach $600 OOP) LABA/ICS    Breo Ellipta (vilanterol/fluticasone) LAMA/LABA    Anoro Ellipta (umeclidinium/vilanterol) ICS    Arnuity Ellipta (fluticasone) LAMA/LABA/ICS    Trellegy Ellipta (umeclidinium/vilanterol/fluticasone) LABA (DPI)    Severent Disckus (salmeterol powder)    Follow Up: PharmD to reach  out to patient via telephone 02/19/23 following pulmonology visit on 02/14/23.    Future Appointments  Date Time Provider Department Center  02/14/2023  1:30 PM Noemi Chapel, NP [facility] None  02/19/2023 10:30 AM [facility]CM PHARMACIST [facility]  03/07/2023   1:30 PM [facility] None  03/08/2023  2:15 PM Jeralyn Ruths, MD [facility] None  03/08/2023  2:45 PM [facility] INFUSION CHAIR 8 [facility] None  04/23/2023  9:15 AM [facility] LAB [facility]  04/25/2023  8:00 AM Dale [place], MD [facility]  06/27/2023  1:00 PM [facility] ANNUAL WELLNESS VISIT [facility]    Loree Fee, PharmD Clinical Pharmacist [facility] [phone]

## 2023-02-01 ENCOUNTER — Other Ambulatory Visit: Payer: PPO | Admitting: Pharmacist

## 2023-02-08 DIAGNOSIS — L720 Epidermal cyst: Secondary | ICD-10-CM | POA: Diagnosis not present

## 2023-02-08 DIAGNOSIS — R208 Other disturbances of skin sensation: Secondary | ICD-10-CM | POA: Diagnosis not present

## 2023-02-08 DIAGNOSIS — D485 Neoplasm of uncertain behavior of skin: Secondary | ICD-10-CM | POA: Diagnosis not present

## 2023-02-08 DIAGNOSIS — L309 Dermatitis, unspecified: Secondary | ICD-10-CM | POA: Diagnosis not present

## 2023-02-08 DIAGNOSIS — L820 Inflamed seborrheic keratosis: Secondary | ICD-10-CM | POA: Diagnosis not present

## 2023-02-08 DIAGNOSIS — C44311 Basal cell carcinoma of skin of nose: Secondary | ICD-10-CM | POA: Diagnosis not present

## 2023-02-08 DIAGNOSIS — L538 Other specified erythematous conditions: Secondary | ICD-10-CM | POA: Diagnosis not present

## 2023-02-14 ENCOUNTER — Encounter: Payer: Self-pay | Admitting: Nurse Practitioner

## 2023-02-14 ENCOUNTER — Other Ambulatory Visit: Payer: Self-pay | Admitting: Internal Medicine

## 2023-02-14 ENCOUNTER — Ambulatory Visit: Payer: PPO | Admitting: Nurse Practitioner

## 2023-02-14 VITALS — BP 126/70 | HR 96 | Temp 98.0°F | Ht 67.0 in | Wt 229.6 lb

## 2023-02-14 DIAGNOSIS — G4733 Obstructive sleep apnea (adult) (pediatric): Secondary | ICD-10-CM

## 2023-02-14 DIAGNOSIS — J4530 Mild persistent asthma, uncomplicated: Secondary | ICD-10-CM

## 2023-02-14 DIAGNOSIS — J301 Allergic rhinitis due to pollen: Secondary | ICD-10-CM

## 2023-02-14 MED ORDER — FLUTICASONE FUROATE-VILANTEROL 100-25 MCG/ACT IN AEPB
1.0000 | INHALATION_SPRAY | Freq: Every day | RESPIRATORY_TRACT | 5 refills | Status: DC
Start: 2023-02-14 — End: 2023-06-05

## 2023-02-14 NOTE — Progress Notes (Unsigned)
@Patient  ID: Susan Miller, female    DOB: 1945/05/21, 77 y.o.   MRN: [id number]  Chief Complaint  Patient presents with   Follow-up    Dry cough. Shortness of breath on exertion.  No wheezing. Was not able to tolerate Wixela, due to increased cough. Is using Arnuity. Canceled appt to ENT for inspire.    Referring provider: Dale [place], MD  HPI: 77 year old female, never smoker followed for chronic cough and mild reactive airways disease. She is a patient of Dr. Jayme Cloud and last seen in office 12/19/2021 by Clent Ridges NP. Past medical history significant for HTN, OSA, GERd, depression, HLD, IDA.   TEST/EVENTS:  05/16/2021 CXR: coarsened interstitial markings. No acute airspace disease 07/08/2021 PFT: FVC 82, FEV1 87, ratio 80, TLC 91, DLCO 81. No bronchodilator response in FEV1; did have midflow reversibility.   12/19/2021: Susan Miller with Clent Ridges NP. Started on flovent at last visit. Doing well. Did notice improvement and is experiencing less wheezing. Uses albuterol once a week for SOB/chest tightness. Heat seems to exacerbate symptoms. Still has some occasional cough, dry. Takes allegra as needed but sometimes makes her sleepy.   11/14/2022: Today - follow up Patient presents today for follow up. Doing relatively similar to last visit. She still has trouble with her cough, which is dry. She does notice it gets worse with increased nasal drainage. She was mowing the grass yesterday and feels like she has more congestion/postnasal drainage today. She still gets shortness of breath with exertion, mainly uphill climbing/strenuous activities. Does fine with household chores. Not noticing much wheezing. She denies any chest congestion, leg swelling, orthopnea, PND. She is using Arnuity daily and albuterol rescue once a week.   FeNO 24 ppb  Allergies  Allergen Reactions   Codeine Hives   Iodinated Contrast Media Swelling   Nitrofurantoin Monohyd Macro Nausea Only    Immunization History  Administered  Date(s) Administered   Fluad Quad(high Dose 65+) 01/31/2019, 01/29/2020, 01/25/2022   Influenza, High Dose Seasonal PF 01/11/2016, 04/27/2017, 01/18/2018   Influenza,inj,Quad PF,6+ Mos 02/24/2013, 02/27/2014, 02/22/2015   PFIZER(Purple Top)SARS-COV-2 Vaccination 06/23/2019, 07/14/2019, 01/06/2022   Pneumococcal Conjugate-13 11/06/2016   Pneumococcal Polysaccharide-23 12/06/2017   RSV,unspecified 01/06/2022    Past Medical History:  Diagnosis Date   Allergy    Depression    History of chicken pox    HLD (hyperlipidemia)    Hx: UTI (urinary tract infection)    Hypertension    Sleep apnea     Tobacco History: Social History   Tobacco Use  Smoking Status Never  Smokeless Tobacco Never   Counseling given: Not Answered   Outpatient Medications Prior to Visit  Medication Sig Dispense Refill   albuterol (VENTOLIN HFA) 108 (90 Base) MCG/ACT inhaler INHALE 2 PUFFS INTO THE LUNGS EVERY 6 HOURS AS NEEDED FOR WHEEZING OR SHORTNESS OF BREATH 18 g 2   ARNUITY ELLIPTA 100 MCG/ACT AEPB Inhale 1 puff into the lungs daily.     azelastine (ASTELIN) 0.1 % nasal spray Place 2 sprays into both nostrils 2 (two) times daily. Use in each nostril as directed 30 mL 6   esomeprazole (NEXIUM) 40 MG capsule TAKE 1 CAPSULE DAILY AT 12 NOON 90 capsule 2   estradiol (ESTRACE) 0.1 MG/GM vaginal cream Estrogen Cream Instruction Discard applicator Apply pea sized amount to tip of finger to urethra before bed. Wash hands well after application. Use Monday, Wednesday and Friday 42.5 g 12   hydrochlorothiazide (HYDRODIURIL) 25 MG tablet TAKE 1/2 TABLET(12.5 MG)  BY MOUTH DAILY 45 tablet 1   losartan (COZAAR) 25 MG tablet TAKE 1 TABLET(25 MG) BY MOUTH DAILY 90 tablet 2   rosuvastatin (CRESTOR) 10 MG tablet Take 1 tablet (10 mg total) by mouth daily. 90 tablet 2   sertraline (ZOLOFT) 25 MG tablet Take 1 tablet (25 mg total) by mouth daily. 90 tablet 1   fluticasone-salmeterol (WIXELA INHUB) 250-50 MCG/ACT AEPB Inhale  1 puff into the lungs in the morning and at bedtime. (Patient not taking: Reported on 02/14/2023) 60 each 5   fluconazole (DIFLUCAN) 150 MG tablet Take 1 tablet (150 mg total) by mouth every 3 (three) days. Repeat if needed (Patient not taking: Reported on 02/14/2023) 3 tablet 0   pimecrolimus (ELIDEL) 1 % cream Apply topically 2 (two) times daily. 30 g 0   sulfamethoxazole-trimethoprim (BACTRIM DS) 800-160 MG tablet Take 1 tablet by mouth 2 (two) times daily. (Patient not taking: Reported on 02/14/2023) 14 tablet 0   No facility-administered medications prior to visit.     Review of Systems:   Constitutional: No weight loss or gain, night sweats, fevers, chills, or lassitude. +occasional fatigue  HEENT: No headaches, difficulty swallowing, tooth/dental problems, or sore throat. No sneezing, itching, ear ache. +nasal congestion, post nasal drip CV:  No chest pain, orthopnea, PND, swelling in lower extremities, anasarca, dizziness, palpitations, syncope Resp: +shortness of breath with exertion; dry cough. No excess mucus or change in color of mucus. No hemoptysis. No wheezing.  No chest wall deformity GI:  No heartburn, indigestion GU: No dysuria, change in color of urine, urgency or frequency. Skin: No rash, lesions, ulcerations MSK:  No joint pain or swelling.   Neuro: No dizziness or lightheadedness.  Psych: No depression or anxiety. Mood stable.     Physical Exam:  BP 126/70 (BP Location: Left Arm, Patient Position: Sitting, Cuff Size: Normal)   Pulse 96   Temp 98 F (36.7 C) (Temporal)   Ht 5\' 7"  (1.702 m)   Wt 229 lb 9.6 oz (104.1 kg)   SpO2 97%   BMI 35.96 kg/m   GEN: Pleasant, interactive, well-appearing; obese; in no acute distress. HEENT:  Normocephalic and atraumatic. PERRLA. Sclera white. Nasal turbinates boggy, moist and patent bilaterally. No rhinorrhea present. Oropharynx pink and moist, without exudate or edema. No lesions, ulcerations.  NECK:  Supple w/ fair ROM. No  JVD present. Normal carotid impulses w/o bruits. Thyroid symmetrical with no goiter or nodules palpated. No lymphadenopathy.   CV: RRR, no m/r/g, no peripheral edema. Pulses intact, +2 bilaterally. No cyanosis, pallor or clubbing. PULMONARY:  Unlabored, regular breathing. Clear bilaterally A&P w/o wheezes/rales/rhonchi. No accessory muscle use.  GI: BS present and normoactive. Soft, non-tender to palpation. No organomegaly or masses detected.  MSK: No erythema, warmth or tenderness. Cap refil <2 sec all extrem. No deformities or joint swelling noted.  Neuro: A/Ox3. No focal deficits noted.   Skin: Warm, no lesions or rashe Psych: Normal affect and behavior. Judgement and thought content appropriate.     Lab Results:  CBC    Component Value Date/Time   WBC 8.9 09/04/2022 1304   RBC 4.11 09/04/2022 1304   HGB 12.5 09/04/2022 1304   HGB 12.3 04/27/2017 1130   HCT 37.1 09/04/2022 1304   HCT 36.7 04/27/2017 1130   PLT 256 09/04/2022 1304   PLT 294 04/27/2017 1130   MCV 90.3 09/04/2022 1304   MCV 89 04/27/2017 1130   MCH 30.4 09/04/2022 1304   MCHC 33.7 09/04/2022 1304  RDW 13.3 09/04/2022 1304   RDW 13.6 04/27/2017 1130   LYMPHSABS 3.2 09/04/2022 1304   LYMPHSABS 2.9 04/27/2017 1130   MONOABS 0.8 09/04/2022 1304   EOSABS 0.2 09/04/2022 1304   EOSABS 0.2 04/27/2017 1130   BASOSABS 0.0 09/04/2022 1304   BASOSABS 0.0 04/27/2017 1130    BMET    Component Value Date/Time   NA 141 12/15/2022 0840   NA 147 (H) 04/23/2017 0759   K 4.7 12/15/2022 0840   CL 103 12/15/2022 0840   CO2 31 12/15/2022 0840   GLUCOSE 111 (H) 12/15/2022 0840   BUN 20 12/15/2022 0840   BUN 19 04/23/2017 0759   CREATININE 1.19 12/15/2022 0840   CALCIUM 9.9 12/15/2022 0840   GFRNONAA 50 (L) 06/12/2019 2030   GFRAA 58 (L) 06/12/2019 2030    BNP    Component Value Date/Time   BNP 90.0 11/09/2018 0849     Imaging:  No results found.  Administration History     None           No data to  display          Lab Results  Component Value Date   NITRICOXIDE 24 11/14/2022        Assessment & Plan:   No problem-specific Assessment & Plan notes found for this encounter.    I spent 35 minutes of dedicated to the care of this patient on the date of this encounter to include pre-visit review of records, face-to-face time with the patient discussing conditions above, post visit ordering of testing, clinical documentation with the electronic health record, making appropriate referrals as documented, and communicating necessary findings to members of the patients care team.  Noemi Chapel, NP 02/14/2023  Pt aware and understands NP's role.

## 2023-02-14 NOTE — Patient Instructions (Addendum)
Continue Albuterol inhaler 2 puffs every 6 hours as needed for shortness of breath or wheezing. Notify if symptoms persist despite rescue inhaler/neb use.  Continue Nexium 1 capsule daily Continue flonase nasal spray 2 sprays each nostril daily Continue astelin nasal spray 2 sprays each nostril Twice daily    Stop Arnuity. Start Breo 1 puff daily. Brush tongue and rinse mouth afterwards   Based on your previous sleep study, you would not qualify for Inspire device. If you change your mind and decide you would like to repeat a sleep study, let me know and I can order one. Otherwise, try to keep working on increasing your activity level and keeping the same bedtime/wake time from day to day.   Follow up in 6-8 weeks with Dr. Jayme Cloud or Katie Araceli Coufal,NP. If symptoms do not improve or worsen, please contact office for sooner follow up or seek emergency care.

## 2023-02-15 ENCOUNTER — Encounter: Payer: Self-pay | Admitting: Nurse Practitioner

## 2023-02-15 NOTE — Assessment & Plan Note (Signed)
Improved.  Utilizing Astelin nasal spray.

## 2023-02-15 NOTE — Assessment & Plan Note (Signed)
Unable to tolerate Wixela.  Not entirely sure why as she is currently on a dry powder inhaler with Arnuity.  Discussed trial of Breo as this is made by the same manufacturer and on her formulary.  Medication education and side effect profile reviewed.  She does receive benefit from her albuterol when used so hopefully this step up in therapy will benefit her.  If not, she can go back to monotherapy with ICS.  Encouraged her to work on graded exercises.  Suspect a component of her dyspnea is related to deconditioning.  Action plan in place.  Patient Instructions  Continue Albuterol inhaler 2 puffs every 6 hours as needed for shortness of breath or wheezing. Notify if symptoms persist despite rescue inhaler/neb use.  Continue Nexium 1 capsule daily Continue flonase nasal spray 2 sprays each nostril daily Continue astelin nasal spray 2 sprays each nostril Twice daily    Stop Arnuity. Start Breo 1 puff daily. Brush tongue and rinse mouth afterwards   Based on your previous sleep study, you would not qualify for Inspire device. If you change your mind and decide you would like to repeat a sleep study, let me know and I can order one. Otherwise, try to keep working on increasing your activity level and keeping the same bedtime/wake time from day to day.   Follow up in 6-8 weeks with Dr. Jayme Cloud or Katie Wrigley Plasencia,NP. If symptoms do not improve or worsen, please contact office for sooner follow up or seek emergency care.

## 2023-02-15 NOTE — Telephone Encounter (Signed)
PriorAuth team can you check with her insurance and see if there is a cheaper alternative to the [facility]? Thank you!  Foye Clock, CMA

## 2023-02-15 NOTE — Progress Notes (Signed)
Agree with the details of the visit as noted by Katherine Cobb, NP. ° °C. Laura Yasin Ducat, MD °Advanced Bronchoscopy °PCCM [facility] ° °

## 2023-02-15 NOTE — Assessment & Plan Note (Signed)
History of very mild OSA; intolerant to CPAP.  Based on this, she would not be a candidate for inspire as this is reserved for people with moderate to severe sleep apnea.  At this point in time, she does not want to repeat a sleep study to reassess the severity of her sleep apnea.  She is minimally symptomatic.  Suspect that some of her daytime fatigue symptoms are due to sedentary lifestyle.  Encouraged her to work on increasing her activity and exercise.  She will notify if she feels like symptoms worsen or she changes her mind and wants to move forward with a sleep study.  Reviewed risks of untreated sleep apnea and potential treatment options.

## 2023-02-16 ENCOUNTER — Other Ambulatory Visit (HOSPITAL_COMMUNITY): Payer: Self-pay

## 2023-02-16 ENCOUNTER — Other Ambulatory Visit: Payer: Self-pay | Admitting: Internal Medicine

## 2023-02-16 NOTE — Telephone Encounter (Signed)
It looks like the Virgel Bouquet is $47.  Unfortunately am not familiar with her insurance formulary so I do not know what would be cheaper unless it would be Advair or Wixela.

## 2023-02-16 NOTE — Telephone Encounter (Signed)
Florentina Addison is out of the office today.   Dr. Jayme Cloud please advise?

## 2023-02-16 NOTE — Telephone Encounter (Signed)
Test claim is showing a $47.00 co-pay for the [facility]

## 2023-02-19 ENCOUNTER — Other Ambulatory Visit: Payer: PPO | Admitting: Pharmacist

## 2023-02-19 NOTE — Progress Notes (Signed)
Agree with the details of the visit as noted by Micheline Maze, NP.  Gailen Shelter, MD Advanced Bronchoscopy PCCM [facility]

## 2023-02-19 NOTE — Progress Notes (Signed)
02/19/2023 Name: Susan Miller MRN: [id number] DOB: [birth year]  Subjective  Chief Complaint  Patient presents with   Medication Access    Reason for visit: Susan Miller is a 77 y.o. year old female who presented for a telephone visit.   They were referred to the pharmacist by their PCP for assistance in managing medication access and Reactive airway disease .   Care Team: Primary Care Provider: Dale Ty Ty, MD ; Next Scheduled Visit: 04/25/23 Pulmonologist: Salena Saner, MD  Reason for visit: ?  Susan Miller is a 77 y.o. female who presents today for a phone visit with the pharmacist due to medication access follow up regarding inhalers.  Last Medication Access visit: 01/30/23 with pharmacist 01/30/23: Tolerating Arnuity well with reasonable copay. Continue until pulmonology f/u in October to review cost.  02/14/23 pulmonology visit: Continued Dulera inhaler   Medication Access: ?  Prescription drug coverage: Payor: [employer] / Plan: [employer] PPO / Product Type: *No Product type* / . Rx Advance - Pharmacy Line: [phone] (Option 2) Preferred pharmacies: N/A. Should be able to fill at any retail pharmacy.  Status: No deductible. Last paid claim 02/16/23 was not in coverage gap [facility] = Tier 4 brand (coverage determination and PA) - PA on file for all 3 inhaler strengths (PA valid 12/04/23) Tier 4 copay: 30 day supply = $100 Breo = Covered; Tier 3 copay: 30 day supply = $47, $94 for 100 day supply   Test claim through [facility] suggests $47 copay, though patient reports that [facility] continues to report that insurance does not cover Breo, estimating a $400 copay.   Inhalers Tried in the Past:  Wixela: Used Wixela for ~4 days, reported constant coughing with it (increased from usual baseline).  Arnuity: Tolerated well; (previously reported copay of $50/month which was affordable) Dulera: Cost prohibitive (Tier 4 brand =  $100/month)  Current Patient Assistance: None Patient lives in a household of 1 with an estimated income of $2100 per month via SS. Out of pocket spend requirement has been a barrier to MAP for inhalers ($600 OOP requirement) Medicare LIS Eligible: No , monthly income exceeds cutoff    Assessment and Plan:   1. Medication Access Cost of Breo remains unaffordable for patient. Suspect this is an issue on [facility] end rather than an insurance coverage issue. Spoke to [employer] who confirms that Virgel Bouquet is covered for $47 per 30 day supply. Called [facility] and spoke to pharmacist who states that the issue was they were billing for generic inhaler. Virgel Bouquet is covered through [facility] under Brand name. Upon billing for Brand name, the copay successfully went through for $47. Insurance issue for [employer] resolved.  Patient to continue Breo as instructed by Pulmonology. Copay is now $47/month at [facility].  Future Consideration:  Patient lives in a household of 1 with an estimated annual income of $2100/month via SSI and therefore would likely be eligible for MAP. However, she does not feel she would reach the $600 OOP exepnse requirement for program enrollment.  Patient is not eligible for copay cards due to government insurance.  Follow Up: With pharmacist as needed    Future Appointments  Date Time Provider Department Center  02/19/2023 10:30 AM [facility] CCM PHARMACIST [facility]  03/07/2023  1:30 PM [facility] None  03/08/2023  2:15 PM Jeralyn Ruths, MD [facility] None  03/08/2023  2:45 PM [facility] INFUSION CHAIR 8 [facility] None  04/13/2023 10:30 AM Cobb, Ruby Cola,  NP [facility] None  04/23/2023  9:15 AM [facility] LAB [facility]  04/25/2023  8:00 AM Dale [place], MD [facility]  06/27/2023  1:00 PM [facility] ANNUAL WELLNESS VISIT [facility]    Loree Fee, PharmD Clinical Pharmacist [facility] [phone]

## 2023-03-06 ENCOUNTER — Other Ambulatory Visit: Payer: Self-pay | Admitting: *Deleted

## 2023-03-06 DIAGNOSIS — D509 Iron deficiency anemia, unspecified: Secondary | ICD-10-CM

## 2023-03-07 ENCOUNTER — Inpatient Hospital Stay: Payer: PPO

## 2023-03-08 ENCOUNTER — Inpatient Hospital Stay: Payer: PPO

## 2023-03-08 ENCOUNTER — Inpatient Hospital Stay: Payer: PPO | Admitting: Oncology

## 2023-03-09 ENCOUNTER — Ambulatory Visit
Admission: RE | Admit: 2023-03-09 | Discharge: 2023-03-09 | Disposition: A | Discharge status: Home / Self Care | Payer: PPO | Source: Ambulatory Visit | Attending: Emergency Medicine | Admitting: Emergency Medicine

## 2023-03-09 VITALS — BP 113/60 | HR 105 | Temp 97.6°F | Resp 20

## 2023-03-09 DIAGNOSIS — N3000 Acute cystitis without hematuria: Secondary | ICD-10-CM | POA: Diagnosis not present

## 2023-03-09 DIAGNOSIS — J069 Acute upper respiratory infection, unspecified: Secondary | ICD-10-CM | POA: Diagnosis not present

## 2023-03-09 LAB — POC COVID19/FLU A&B COMBO
Covid Antigen, POC: NEGATIVE
Influenza A Antigen, POC: NEGATIVE
Influenza B Antigen, POC: NEGATIVE

## 2023-03-09 LAB — POCT URINALYSIS DIP (MANUAL ENTRY)
Glucose, UA: NEGATIVE mg/dL
Ketones, POC UA: NEGATIVE mg/dL
Nitrite, UA: NEGATIVE
Protein Ur, POC: 30 mg/dL — AB
Spec Grav, UA: 1.02 (ref 1.010–1.025)
Urobilinogen, UA: 1 U/dL
pH, UA: 7 (ref 5.0–8.0)

## 2023-03-09 MED ORDER — CEPHALEXIN 500 MG PO CAPS: 500.0000 mg | ORAL_CAPSULE | Freq: Two times a day (BID) | ORAL | 0 refills | Status: AC

## 2023-03-09 NOTE — ED Provider Notes (Signed)
[name]    CSN: [id number] Arrival date & time: 03/09/23  0909      History   Chief Complaint Chief Complaint  Patient presents with   Urinary Frequency    Sinus messed up and now uti symptoms - Entered by patient    HPI Susan Miller is a 77 y.o. female.  Patient presents with 3-day history of runny nose, congestion, sinus pressure, cough.  She developed chills, dysuria, urinary frequency yesterday.  She has been treating her symptoms with OTC cold medication.  No fever, shortness of breath, abdominal pain, hematuria, or other symptoms.  Her medical history includes hypertension, mild reactive airway disease, allergies.  The history is provided by the patient and medical records.    Past Medical History:  Diagnosis Date   Allergy    Depression    History of chicken pox    HLD (hyperlipidemia)    Hx: UTI (urinary tract infection)    Hypertension    Sleep apnea     Patient Active Problem List   Diagnosis Date Noted   Rash 12/23/2022   Reactive airways dysfunction syndrome (HCC) 12/23/2022   Foot pain 12/23/2022   Allergic rhinitis 11/14/2022   Mild reactive airways disease 10/11/2022   Polyp of ascending colon    Knee pain, right 12/13/2021   Major depressive disorder, single episode, mild (HCC) 12/11/2021   Iron deficiency anemia, unspecified 04/10/2021   Aortic atherosclerosis (HCC) 04/10/2021   UTI (urinary tract infection) 03/27/2021   Cutaneous skin tags 12/12/2020   B12 deficiency 12/12/2020   OSA (obstructive sleep apnea) 04/18/2020   Numbness and tingling of both feet 02/20/2020   Daytime hypersomnolence 01/29/2020   Dysuria 12/07/2019   Snoring 12/07/2019   Hypercholesteremia 10/09/2019   Back pain 11/15/2018   SOB (shortness of breath) 11/15/2018   Decreased GFR 11/14/2018   Hyperbilirubinemia 11/14/2018   Abdominal bloating 12/09/2017   Osteopenia 04/29/2017   Hyperglycemia 04/27/2017   Reflux esophagitis    Heartburn    Special  screening for malignant neoplasms, colon    Hair loss 10/26/2015   Fatigue 10/21/2015   Vitamin D deficiency 03/15/2015   Weight loss counseling, encounter for 02/22/2015   Facial erythema 07/27/2014   Health care maintenance 07/05/2014   BP (high blood pressure) 03/26/2014   Headache 03/08/2014   Eyelid lesion 03/08/2014   Shingles 11/16/2013   Light headedness 06/22/2013   Cough 05/18/2013   GERD (gastroesophageal reflux disease) 05/18/2013   Swelling of extremity 03/01/2013   Left knee pain 03/01/2013   Essential hypertension, benign 12/15/2012   Frequent UTI 12/15/2012   Mild depression 12/15/2012   Environmental allergies 12/15/2012    Past Surgical History:  Procedure Laterality Date   COLONOSCOPY WITH PROPOFOL N/A 06/20/2016   Procedure: COLONOSCOPY WITH PROPOFOL;  Surgeon: Midge Minium, MD;  Location: [facility] ENDOSCOPY;  Service: Endoscopy;  Laterality: N/A;   COLONOSCOPY WITH PROPOFOL N/A 02/14/2022   Procedure: COLONOSCOPY WITH PROPOFOL;  Surgeon: Midge Minium, MD;  Location: [facility] ENDOSCOPY;  Service: Endoscopy;  Laterality: N/A;   ESOPHAGOGASTRODUODENOSCOPY N/A 02/14/2022   Procedure: ESOPHAGOGASTRODUODENOSCOPY (EGD);  Surgeon: Midge Minium, MD;  Location: [facility] ENDOSCOPY;  Service: Endoscopy;  Laterality: N/A;   ESOPHAGOGASTRODUODENOSCOPY (EGD) WITH PROPOFOL N/A 06/20/2016   Procedure: ESOPHAGOGASTRODUODENOSCOPY (EGD) WITH PROPOFOL;  Surgeon: Midge Minium, MD;  Location: [facility] ENDOSCOPY;  Service: Endoscopy;  Laterality: N/A;   TONSILLECTOMY     as a child   TUMOR REMOVAL     behind ear  OB History   No obstetric history on file.      Home Medications    Prior to Admission medications   Medication Sig Start Date End Date Taking? Authorizing Provider  ARNUITY ELLIPTA 100 MCG/ACT AEPB Inhale 1 puff into the lungs daily. 02/27/23  Yes [provider]  cephALEXin (KEFLEX) 500 MG capsule Take 1 capsule (500 mg total) by mouth 2 (two) times daily for 5 days.  03/09/23 03/14/23 Yes Mickie Bail, NP  albuterol (VENTOLIN HFA) 108 (90 Base) MCG/ACT inhaler INHALE 2 PUFFS INTO THE LUNGS EVERY 6 HOURS AS NEEDED FOR WHEEZING OR SHORTNESS OF BREATH 11/10/22   Salena Saner, MD  azelastine (ASTELIN) 0.1 % nasal spray Place 2 sprays into both nostrils 2 (two) times daily. Use in each nostril as directed 11/14/22   Cobb, Ruby Cola, NP  esomeprazole (NEXIUM) 40 MG capsule TAKE 1 CAPSULE DAILY AT 12 NOON 08/17/22   Dale [place], MD  estradiol (ESTRACE) 0.1 MG/GM vaginal cream Estrogen Cream Instruction Discard applicator Apply pea sized amount to tip of finger to urethra before bed. Wash hands well after application. Use Monday, Wednesday and Friday 06/23/21   Vanna [place], MD  fluticasone furoate-vilanterol (BREO ELLIPTA) 100-25 MCG/ACT AEPB Inhale 1 puff into the lungs daily. 02/14/23   Cobb, Ruby Cola, NP  hydrochlorothiazide (HYDRODIURIL) 25 MG tablet TAKE 1/2 TABLET(12.5 MG) BY MOUTH DAILY 11/29/22   Dale [place], MD  losartan (COZAAR) 25 MG tablet TAKE 1 TABLET(25 MG) BY MOUTH DAILY 02/14/23   Dale [place], MD  rosuvastatin (CRESTOR) 10 MG tablet TAKE 1 TABLET(10 MG) BY MOUTH DAILY 02/14/23   Dale [place], MD  sertraline (ZOLOFT) 25 MG tablet TAKE 1 TABLET BY MOUTH EVERY DAY 02/16/23   Dale [place], MD    Family History Family History  Problem Relation Age of Onset   Hypertension Mother    Cancer Sister        colon cancer   Hypertension Sister    Diabetes Sister    Hypertension Brother    Hypertension Maternal Aunt    Breast cancer Maternal Aunt 60   Hypertension Maternal Uncle    Stroke Maternal Grandmother    Hypertension Maternal Grandmother     Social History Social History   Tobacco Use   Smoking status: Never   Smokeless tobacco: Never  Vaping Use   Vaping status: Never Used  Substance Use Topics   Alcohol use: No    Alcohol/week: 0.0 standard drinks of alcohol   Drug use: No     Allergies   Codeine, Iodinated  contrast media, and Nitrofurantoin monohyd macro   Review of Systems Review of Systems  Constitutional:  Positive for chills. Negative for fever.  HENT:  Positive for congestion, postnasal drip, rhinorrhea and sinus pressure. Negative for ear pain and sore throat.   Respiratory:  Positive for cough. Negative for shortness of breath.   Cardiovascular:  Negative for chest pain and palpitations.  Genitourinary:  Positive for dysuria and frequency. Negative for flank pain and hematuria.     Physical Exam Triage Vital Signs ED Triage Vitals  Encounter Vitals Group     BP      Systolic BP Percentile      Diastolic BP Percentile      Pulse      Resp      Temp      Temp src      SpO2      Weight      Height  Head Circumference      Peak Flow      Pain Score      Pain Loc      Pain Education      Exclude from Growth Chart    No data found.  Updated Vital Signs BP 113/60   Pulse (!) 105   Temp 97.6 F (36.4 C)   Resp 20   SpO2 96%   Visual Acuity Right Eye Distance:   Left Eye Distance:   Bilateral Distance:    Right Eye Near:   Left Eye Near:    Bilateral Near:     Physical Exam Constitutional:      General: She is not in acute distress. HENT:     Right Ear: Tympanic membrane normal.     Left Ear: Tympanic membrane normal.     Nose: Congestion and rhinorrhea present.     Mouth/Throat:     Mouth: Mucous membranes are moist.     Pharynx: Oropharynx is clear.  Cardiovascular:     Rate and Rhythm: Normal rate and regular rhythm.     Heart sounds: Normal heart sounds.  Pulmonary:     Effort: Pulmonary effort is normal. No respiratory distress.     Breath sounds: Normal breath sounds.  Abdominal:     General: Bowel sounds are normal.     Palpations: Abdomen is soft.     Tenderness: There is no abdominal tenderness. There is no right CVA tenderness, left CVA tenderness, guarding or rebound.  Skin:    General: Skin is warm and dry.  Neurological:      Mental Status: She is alert.      UC Treatments / Results  Labs (all labs ordered are listed, but only abnormal results are displayed) Labs Reviewed  POCT URINALYSIS DIP (MANUAL ENTRY) - Abnormal; Notable for the following components:      Result Value   Clarity, UA turbid (*)    Bilirubin, UA small (*)    Blood, UA trace-intact (*)    Protein Ur, POC =30 (*)    Leukocytes, UA Small (1+) (*)    All other components within normal limits  URINE CULTURE  POC COVID19/FLU A&B COMBO    EKG   Radiology No results found.  Procedures Procedures (including critical care time)  Medications Ordered in UC Medications - No data to display  Initial Impression / Assessment and Plan / UC Course  I have reviewed the triage vital signs and the nursing notes.  Pertinent labs & imaging results that were available during my care of the patient were reviewed by me and considered in my medical decision making (see chart for details).    Acute cystitis, Viral URI.   Treating with Keflex. Urine culture pending. Discussed with patient that we will call her if the urine culture shows the need to change or discontinue the antibiotic.  Rapid flu and COVID negative.  Discussed symptomatic treatment for her URI symptoms including Tylenol and plain Mucinex.  Instructed patient to follow-up with her PCP next week.  Education provided on UTI and viral respiratory infection.  She agrees to plan of care.  Final Clinical Impressions(s) / UC Diagnoses   Final diagnoses:  Acute cystitis without hematuria  Viral URI     Discharge Instructions      Take the antibiotic as directed.  The urine culture is pending.  We will call you if it shows the need to change or discontinue your antibiotic.  Your COVID and flu tests are negative.  Take Tylenol as needed for fever or discomfort.  Take plain Mucinex as needed for congestion.    Follow-up with your primary care provider next week.     ED  Prescriptions     Medication Sig Dispense Auth. Provider   cephALEXin (KEFLEX) 500 MG capsule Take 1 capsule (500 mg total) by mouth 2 (two) times daily for 5 days. 10 capsule Mickie Bail, NP      PDMP not reviewed this encounter.   Mickie Bail, NP 03/09/23 1032

## 2023-03-09 NOTE — Discharge Instructions (Addendum)
Take the antibiotic as directed.  The urine culture is pending.  We will call you if it shows the need to change or discontinue your antibiotic.    Your COVID and flu tests are negative.  Take Tylenol as needed for fever or discomfort.  Take plain Mucinex as needed for congestion.    Follow-up with your primary care provider next week.

## 2023-03-09 NOTE — ED Triage Notes (Signed)
Patient to Urgent Care with complaints of dysuria/ urinary frequency. Symptoms started yesterday afternoon.   Also w/ complaints of sinus congestion and pressure. Symptoms started 4 days ago. Taking otc cold and flu medication.   Chills last night. No fevers.

## 2023-03-13 LAB — URINE CULTURE: Culture: 20000 — AB

## 2023-03-21 ENCOUNTER — Inpatient Hospital Stay: Payer: PPO | Attending: Oncology

## 2023-03-21 DIAGNOSIS — Z79899 Other long term (current) drug therapy: Secondary | ICD-10-CM | POA: Diagnosis not present

## 2023-03-21 DIAGNOSIS — Z862 Personal history of diseases of the blood and blood-forming organs and certain disorders involving the immune mechanism: Secondary | ICD-10-CM | POA: Diagnosis not present

## 2023-03-21 DIAGNOSIS — D509 Iron deficiency anemia, unspecified: Secondary | ICD-10-CM

## 2023-03-21 LAB — CBC WITH DIFFERENTIAL (CANCER CENTER ONLY)
Abs Immature Granulocytes: 0.03 10*3/uL (ref 0.00–0.07)
Basophils Absolute: 0 10*3/uL (ref 0.0–0.1)
Basophils Relative: 0 %
Eosinophils Absolute: 0.2 10*3/uL (ref 0.0–0.5)
Eosinophils Relative: 2 %
HCT: 36.7 % (ref 36.0–46.0)
Hemoglobin: 12.1 g/dL (ref 12.0–15.0)
Immature Granulocytes: 0 %
Lymphocytes Relative: 29 %
Lymphs Abs: 2.3 10*3/uL (ref 0.7–4.0)
MCH: 30.4 pg (ref 26.0–34.0)
MCHC: 33 g/dL (ref 30.0–36.0)
MCV: 92.2 fL (ref 80.0–100.0)
Monocytes Absolute: 0.7 10*3/uL (ref 0.1–1.0)
Monocytes Relative: 8 %
Neutro Abs: 4.8 10*3/uL (ref 1.7–7.7)
Neutrophils Relative %: 61 %
Platelet Count: 253 10*3/uL (ref 150–400)
RBC: 3.98 MIL/uL (ref 3.87–5.11)
RDW: 13 % (ref 11.5–15.5)
WBC Count: 8.1 10*3/uL (ref 4.0–10.5)
nRBC: 0 % (ref 0.0–0.2)

## 2023-03-21 LAB — BASIC METABOLIC PANEL - CANCER CENTER ONLY
Anion gap: 8 (ref 5–15)
BUN: 18 mg/dL (ref 8–23)
CO2: 27 mmol/L (ref 22–32)
Calcium: 9.3 mg/dL (ref 8.9–10.3)
Chloride: 102 mmol/L (ref 98–111)
Creatinine: 1.05 mg/dL — ABNORMAL HIGH (ref 0.44–1.00)
GFR, Estimated: 55 mL/min — ABNORMAL LOW (ref >60)
Glucose, Bld: 97 mg/dL (ref 70–99)
Potassium: 3.9 mmol/L (ref 3.5–5.1)
Sodium: 137 mmol/L (ref 135–145)

## 2023-03-21 LAB — IRON AND TIBC
Iron: 138 ug/dL (ref 28–170)
Saturation Ratios: 40 % — ABNORMAL HIGH (ref 10.4–31.8)
TIBC: 347 ug/dL (ref 250–450)
UIBC: 209 ug/dL

## 2023-03-21 LAB — FERRITIN: Ferritin: 19 ng/mL (ref 11–307)

## 2023-03-22 ENCOUNTER — Encounter: Payer: Self-pay | Admitting: Oncology

## 2023-03-22 ENCOUNTER — Inpatient Hospital Stay: Payer: PPO

## 2023-03-22 ENCOUNTER — Inpatient Hospital Stay: Payer: PPO | Admitting: Oncology

## 2023-03-22 VITALS — BP 126/67 | HR 89 | Temp 96.5°F | Resp 16 | Ht 67.0 in | Wt 226.7 lb

## 2023-03-22 DIAGNOSIS — D509 Iron deficiency anemia, unspecified: Secondary | ICD-10-CM | POA: Diagnosis not present

## 2023-03-22 DIAGNOSIS — Z862 Personal history of diseases of the blood and blood-forming organs and certain disorders involving the immune mechanism: Secondary | ICD-10-CM | POA: Diagnosis not present

## 2023-03-22 NOTE — Progress Notes (Signed)
[facility]  Telephone:([phone]) [phone] [phone]) [phone]  ID: [name] OB: Jun 09, 1945  MR#: [id number]  IHK#:[id number]  Patient Care Team: Dale Carlisle, MD as PCP - General (Internal Medicine) Debbe Odea, MD as PCP - Cardiology (Cardiology) Loree Fee, [facility] as Pharmacist (Pharmacist)  CHIEF COMPLAINT: Iron deficiency anemia.  INTERVAL HISTORY: Patient returns to clinic today for repeat laboratory work, further evaluation, and consideration of additional IV Venofer.  She continues to feel well and remains asymptomatic.  She does not complain of any weakness or fatigue today.  She has no neurologic complaints.  She denies any recent fevers or illnesses.  She has a good appetite and denies weight loss.  She has no chest pain, shortness of breath, cough, or hemoptysis.  She denies any nausea, vomiting, constipation, or diarrhea.  She has no melena or hematochezia.  She has no urinary complaints.  Patient offers no specific complaints today.  REVIEW OF SYSTEMS:   Review of Systems  Constitutional: Negative.  Negative for fever, malaise/fatigue and weight loss.  Respiratory: Negative.  Negative for cough, hemoptysis and shortness of breath.   Cardiovascular: Negative.  Negative for chest pain and leg swelling.  Gastrointestinal: Negative.  Negative for abdominal pain, blood in stool and melena.  Genitourinary: Negative.  Negative for hematuria.  Musculoskeletal: Negative.  Negative for back pain.  Skin: Negative.  Negative for rash.  Neurological: Negative.  Negative for dizziness, focal weakness, weakness and headaches.  Psychiatric/Behavioral: Negative.  The patient is not nervous/anxious.     As per HPI. Otherwise, a complete review of systems is negative.  PAST MEDICAL HISTORY: Past Medical History:  Diagnosis Date   Allergy    Depression    History of chicken pox    HLD (hyperlipidemia)    Hx: UTI (urinary tract infection)    Hypertension     Sleep apnea     PAST SURGICAL HISTORY: Past Surgical History:  Procedure Laterality Date   COLONOSCOPY WITH PROPOFOL N/A 06/20/2016   Procedure: COLONOSCOPY WITH PROPOFOL;  Surgeon: Midge Minium, MD;  Location: [facility] ENDOSCOPY;  Service: Endoscopy;  Laterality: N/A;   COLONOSCOPY WITH PROPOFOL N/A 02/14/2022   Procedure: COLONOSCOPY WITH PROPOFOL;  Surgeon: Midge Minium, MD;  Location[facility] ENDOSCOPY;  Service: Endoscopy;  Laterality: N/A;   ESOPHAGOGASTRODUODENOSCOPY N/A 02/14/2022   Procedure: ESOPHAGOGASTRODUODENOSCOPY (EGD);  Surgeon: Midge Minium, MD;  Location: [facility] ENDOSCOPY;  Service: Endoscopy;  Laterality: N/A;   ESOPHAGOGASTRODUODENOSCOPY (EGD) WITH PROPOFOL N/A 06/20/2016   Procedure: ESOPHAGOGASTRODUODENOSCOPY (EGD) WITH PROPOFOL;  Surgeon: Midge Minium, MD;  Location: [facility] ENDOSCOPY;  Service: Endoscopy;  Laterality: N/A;   TONSILLECTOMY     as a child   TUMOR REMOVAL     behind ear    FAMILY HISTORY: Family History  Problem Relation Age of Onset   Hypertension Mother    Cancer Sister        colon cancer   Hypertension Sister    Diabetes Sister    Hypertension Brother    Hypertension Maternal Aunt    Breast cancer Maternal Aunt 60   Hypertension Maternal Uncle    Stroke Maternal Grandmother    Hypertension Maternal Grandmother     ADVANCED DIRECTIVES (Y/N):  N  HEALTH MAINTENANCE: Social History   Tobacco Use   Smoking status: Never   Smokeless tobacco: Never  Vaping Use   Vaping status: Never Used  Substance Use Topics   Alcohol use: No    Alcohol/week: 0.0 standard drinks of alcohol  Drug use: No     Colonoscopy:  PAP:  Bone density:  Lipid panel:  Allergies  Allergen Reactions   Codeine Hives   Iodinated Contrast Media Swelling   Nitrofurantoin Monohyd Macro Nausea Only    Current Outpatient Medications  Medication Sig Dispense Refill   albuterol (VENTOLIN HFA) 108 (90 Base) MCG/ACT inhaler INHALE 2 PUFFS INTO THE LUNGS EVERY 6 HOURS  AS NEEDED FOR WHEEZING OR SHORTNESS OF BREATH 18 g 2   ARNUITY ELLIPTA 100 MCG/ACT AEPB Inhale 1 puff into the lungs daily.     azelastine (ASTELIN) 0.1 % nasal spray Place 2 sprays into both nostrils 2 (two) times daily. Use in each nostril as directed 30 mL 6   esomeprazole (NEXIUM) 40 MG capsule TAKE 1 CAPSULE DAILY AT 12 NOON 90 capsule 2   estradiol (ESTRACE) 0.1 MG/GM vaginal cream Estrogen Cream Instruction Discard applicator Apply pea sized amount to tip of finger to urethra before bed. Wash hands well after application. Use Monday, Wednesday and Friday 42.5 g 12   fluticasone furoate-vilanterol (BREO ELLIPTA) 100-25 MCG/ACT AEPB Inhale 1 puff into the lungs daily. 30 each 5   hydrochlorothiazide (HYDRODIURIL) 25 MG tablet TAKE 1/2 TABLET(12.5 MG) BY MOUTH DAILY 45 tablet 1   losartan (COZAAR) 25 MG tablet TAKE 1 TABLET(25 MG) BY MOUTH DAILY 90 tablet 2   rosuvastatin (CRESTOR) 10 MG tablet TAKE 1 TABLET(10 MG) BY MOUTH DAILY 90 tablet 2   sertraline (ZOLOFT) 25 MG tablet TAKE 1 TABLET BY MOUTH EVERY DAY 90 tablet 1   No current facility-administered medications for this visit.    OBJECTIVE: Vitals:   03/22/23 1022  BP: 126/67  Pulse: 89  Resp: 16  Temp: (!) 96.5 F (35.8 C)  SpO2: 100%     Body mass index is 35.51 kg/m.    ECOG FS:0 - Asymptomatic  General: Well-developed, well-nourished, no acute distress. Eyes: Pink conjunctiva, anicteric sclera. HEENT: Normocephalic, moist mucous membranes. Lungs: No audible wheezing or coughing. Heart: Regular rate and rhythm. Abdomen: Soft, nontender, no obvious distention. Musculoskeletal: No edema, cyanosis, or clubbing. Neuro: Alert, answering all questions appropriately. Cranial nerves grossly intact. Skin: No rashes or petechiae noted. Psych: Normal affect.  LAB RESULTS:  Lab Results  Component Value Date   NA 137 03/21/2023   K 3.9 03/21/2023   CL 102 03/21/2023   CO2 27 03/21/2023   GLUCOSE 97 03/21/2023   BUN 18  03/21/2023   CREATININE 1.05 (H) 03/21/2023   CALCIUM 9.3 03/21/2023   PROT 6.8 12/15/2022   ALBUMIN 4.1 12/15/2022   AST 15 12/15/2022   ALT 13 12/15/2022   ALKPHOS 69 12/15/2022   BILITOT 0.4 12/15/2022   GFRNONAA 55 (L) 03/21/2023   GFRAA 58 (L) 06/12/2019    Lab Results  Component Value Date   WBC 8.1 03/21/2023   NEUTROABS 4.8 03/21/2023   HGB 12.1 03/21/2023   HCT 36.7 03/21/2023   MCV 92.2 03/21/2023   PLT 253 03/21/2023   Lab Results  Component Value Date   IRON 138 03/21/2023   TIBC 347 03/21/2023   IRONPCTSAT 40 (H) 03/21/2023   Lab Results  Component Value Date   FERRITIN 19 03/21/2023     STUDIES: No results found.  ASSESSMENT: Iron deficiency anemia.  PLAN:    Iron deficiency anemia: Resolved.  Patient's hemoglobin and iron stores continue to be within normal limits. Colonoscopy and EGD in October 2023 did not reveal any significant pathology.  Patient could not tolerate video  endoscopy.  Patient last received IV Venofer on June 15, 2022.  No further intervention is needed.  R after lengthy discussion with the patient, is agreed upon that no further follow-up is necessary.  Please refer patient back if there are any questions or concerns.  I spent a total of 20 minutes reviewing chart data, face-to-face evaluation with the patient, counseling and coordination of care as detailed above.   Patient expressed understanding and was in agreement with this plan. She also understands that She can call clinic at any time with any questions, concerns, or complaints.    Jeralyn Ruths, MD   03/22/2023 4:22 PM

## 2023-04-06 ENCOUNTER — Other Ambulatory Visit: Payer: Self-pay

## 2023-04-06 ENCOUNTER — Ambulatory Visit
Admission: RE | Admit: 2023-04-06 | Discharge: 2023-04-06 | Disposition: A | Discharge status: Home / Self Care | Payer: PPO | Source: Ambulatory Visit | Attending: Emergency Medicine

## 2023-04-06 VITALS — BP 135/71 | HR 83 | Temp 98.5°F | Resp 20

## 2023-04-06 DIAGNOSIS — J4531 Mild persistent asthma with (acute) exacerbation: Secondary | ICD-10-CM

## 2023-04-06 MED ORDER — PREDNISONE 10 MG PO TABS: 40.0000 mg | ORAL_TABLET | Freq: Every day | ORAL | 0 refills | Status: AC

## 2023-04-06 NOTE — ED Triage Notes (Signed)
Symptoms started on Monday.  Reports severity of symptoms fluctuates.  Patient has a cough.  Has a congested cough.  Patient reports she cannot quite cough phlegm out of her throat.  Patient has an appt with provider one week today.  Has taken zyrtec, OTC cough medicine.

## 2023-04-06 NOTE — Discharge Instructions (Addendum)
Use your albuterol inhaler as directed.  Take the prednisone as directed.    Follow up with your primary care provider on Monday.  Go to the emergency department if you have worsening symptoms.

## 2023-04-06 NOTE — ED Provider Notes (Signed)
Susan Miller    CSN: [id number] Arrival date & time: 04/06/23  1503      History   Chief Complaint Chief Complaint  Patient presents with   Appointment    15:15   Cough    HPI Susan Miller is a 77 y.o. female.  Patient presents with congestion and cough x 4 days.  Treatment attempted with OTC allergy and cough medication.  She has not used her albuterol inhaler today.  No fever, chest pain, shortness of breath, or other symptoms.  Patient is followed by pulmonology for mild persistent reactive airway disease.  Patient was seen here on 03/09/2023; diagnosed with acute cystitis and viral URI; treated with Keflex.  The history is provided by the patient and medical records.    Past Medical History:  Diagnosis Date   Allergy    Depression    History of chicken pox    HLD (hyperlipidemia)    Hx: UTI (urinary tract infection)    Hypertension    Sleep apnea     Patient Active Problem List   Diagnosis Date Noted   Rash 12/23/2022   Reactive airways dysfunction syndrome (HCC) 12/23/2022   Foot pain 12/23/2022   Allergic rhinitis 11/14/2022   Mild reactive airways disease 10/11/2022   Polyp of ascending colon    Knee pain, right 12/13/2021   Major depressive disorder, single episode, mild (HCC) 12/11/2021   Iron deficiency anemia, unspecified 04/10/2021   Aortic atherosclerosis (HCC) 04/10/2021   UTI (urinary tract infection) 03/27/2021   Cutaneous skin tags 12/12/2020   B12 deficiency 12/12/2020   OSA (obstructive sleep apnea) 04/18/2020   Numbness and tingling of both feet 02/20/2020   Daytime hypersomnolence 01/29/2020   Dysuria 12/07/2019   Snoring 12/07/2019   Hypercholesteremia 10/09/2019   Back pain 11/15/2018   SOB (shortness of breath) 11/15/2018   Decreased GFR 11/14/2018   Hyperbilirubinemia 11/14/2018   Abdominal bloating 12/09/2017   Osteopenia 04/29/2017   Hyperglycemia 04/27/2017   Reflux esophagitis    Heartburn    Special screening  for malignant neoplasms, colon    Hair loss 10/26/2015   Fatigue 10/21/2015   Vitamin D deficiency 03/15/2015   Weight loss counseling, encounter for 02/22/2015   Facial erythema 07/27/2014   Health care maintenance 07/05/2014   BP (high blood pressure) 03/26/2014   Headache 03/08/2014   Eyelid lesion 03/08/2014   Shingles 11/16/2013   Light headedness 06/22/2013   Cough 05/18/2013   GERD (gastroesophageal reflux disease) 05/18/2013   Swelling of extremity 03/01/2013   Left knee pain 03/01/2013   Essential hypertension, benign 12/15/2012   Frequent UTI 12/15/2012   Mild depression 12/15/2012   Environmental allergies 12/15/2012    Past Surgical History:  Procedure Laterality Date   COLONOSCOPY WITH PROPOFOL N/A 06/20/2016   Procedure: COLONOSCOPY WITH PROPOFOL;  Surgeon: Susan Minium, MD;  Location: [facility] ENDOSCOPY;  Service: Endoscopy;  Laterality: N/A;   COLONOSCOPY WITH PROPOFOL N/A 02/14/2022   Procedure: COLONOSCOPY WITH PROPOFOL;  Surgeon: Susan Minium, MD;  Location: [facility] ENDOSCOPY;  Service: Endoscopy;  Laterality: N/A;   ESOPHAGOGASTRODUODENOSCOPY N/A 02/14/2022   Procedure: ESOPHAGOGASTRODUODENOSCOPY (EGD);  Surgeon: Susan Minium, MD;  Location: [facility] ENDOSCOPY;  Service: Endoscopy;  Laterality: N/A;   ESOPHAGOGASTRODUODENOSCOPY (EGD) WITH PROPOFOL N/A 06/20/2016   Procedure: ESOPHAGOGASTRODUODENOSCOPY (EGD) WITH PROPOFOL;  Surgeon: Susan Minium, MD;  Location: [facility] ENDOSCOPY;  Service: Endoscopy;  Laterality: N/A;   TONSILLECTOMY     as a child   TUMOR REMOVAL  behind ear    OB History   No obstetric history on file.      Home Medications    Prior to Admission medications   Medication Sig Start Date End Date Taking? Authorizing Provider  predniSONE (DELTASONE) 10 MG tablet Take 4 tablets (40 mg total) by mouth daily for 5 days. 04/06/23 04/11/23 Yes Mickie Bail, NP  albuterol (VENTOLIN HFA) 108 (90 Base) MCG/ACT inhaler INHALE 2 PUFFS INTO THE LUNGS EVERY 6  HOURS AS NEEDED FOR WHEEZING OR SHORTNESS OF BREATH 11/10/22   Salena Saner, MD  ARNUITY ELLIPTA 100 MCG/ACT AEPB Inhale 1 puff into the lungs daily. Patient not taking: Reported on 04/06/2023 02/27/23   [provider]  azelastine (ASTELIN) 0.1 % nasal spray Place 2 sprays into both nostrils 2 (two) times daily. Use in each nostril as directed 11/14/22   Miller, Susan Cola, NP  esomeprazole (NEXIUM) 40 MG capsule TAKE 1 CAPSULE DAILY AT 12 NOON 08/17/22   Dale Grabill, MD  estradiol (ESTRACE) 0.1 MG/GM vaginal cream Estrogen Cream Instruction Discard applicator Apply pea sized amount to tip of finger to urethra before bed. Wash hands well after application. Use Monday, Wednesday and Friday 06/23/21   Susan Scotland, MD  fluticasone furoate-vilanterol (BREO ELLIPTA) 100-25 MCG/ACT AEPB Inhale 1 puff into the lungs daily. 02/14/23   Miller, Susan Cola, NP  hydrochlorothiazide (HYDRODIURIL) 25 MG tablet TAKE 1/2 TABLET(12.5 MG) BY MOUTH DAILY 11/29/22   Dale Ault, MD  losartan (COZAAR) 25 MG tablet TAKE 1 TABLET(25 MG) BY MOUTH DAILY 02/14/23   Dale Hume, MD  rosuvastatin (CRESTOR) 10 MG tablet TAKE 1 TABLET(10 MG) BY MOUTH DAILY 02/14/23   Dale Bremen, MD  sertraline (ZOLOFT) 25 MG tablet TAKE 1 TABLET BY MOUTH EVERY DAY 02/16/23   Dale , MD    Family History Family History  Problem Relation Age of Onset   Hypertension Mother    Cancer Sister        colon cancer   Hypertension Sister    Diabetes Sister    Hypertension Brother    Hypertension Maternal Aunt    Breast cancer Maternal Aunt 60   Hypertension Maternal Uncle    Stroke Maternal Grandmother    Hypertension Maternal Grandmother     Social History Social History   Tobacco Use   Smoking status: Never   Smokeless tobacco: Never  Vaping Use   Vaping status: Never Used  Substance Use Topics   Alcohol use: No    Alcohol/week: 0.0 standard drinks of alcohol   Drug use: No     Allergies    Codeine, Iodinated contrast media, and Nitrofurantoin monohyd macro   Review of Systems Review of Systems  Constitutional:  Negative for chills and fever.  HENT:  Positive for congestion. Negative for ear pain and sore throat.   Respiratory:  Positive for cough. Negative for shortness of breath.   Cardiovascular:  Negative for chest pain and palpitations.     Physical Exam Triage Vital Signs ED Triage Vitals  Encounter Vitals Group     BP 04/06/23 1530 135/71     Systolic BP Percentile --      Diastolic BP Percentile --      Pulse Rate 04/06/23 1530 83     Resp 04/06/23 1530 20     Temp 04/06/23 1530 98.5 F (36.9 C)     Temp Source 04/06/23 1530 Oral     SpO2 04/06/23 1530 99 %     Weight --  Height --      Head Circumference --      Peak Flow --      Pain Score 04/06/23 1527 3     Pain Loc --      Pain Education --      Exclude from Growth Chart --    No data found.  Updated Vital Signs BP 135/71 (BP Location: Left Arm) Comment (BP Location): large cuff  Pulse 83   Temp 98.5 F (36.9 C) (Oral)   Resp 20   SpO2 99%   Visual Acuity Right Eye Distance:   Left Eye Distance:   Bilateral Distance:    Right Eye Near:   Left Eye Near:    Bilateral Near:     Physical Exam Constitutional:      General: She is not in acute distress. HENT:     Right Ear: Tympanic membrane normal.     Left Ear: Tympanic membrane normal.     Nose: Nose normal.     Mouth/Throat:     Mouth: Mucous membranes are moist.     Pharynx: Oropharynx is clear.  Cardiovascular:     Rate and Rhythm: Normal rate and regular rhythm.     Heart sounds: Normal heart sounds.  Pulmonary:     Effort: Pulmonary effort is normal. No respiratory distress.     Breath sounds: Wheezing present.     Comments: Faint expiratory wheeze in upper airway. Neurological:     Mental Status: She is alert.      UC Treatments / Results  Labs (all labs ordered are listed, but only abnormal results are  displayed) Labs Reviewed - No data to display  EKG   Radiology No results found.  Procedures Procedures (including critical care time)  Medications Ordered in UC Medications - No data to display  Initial Impression / Assessment and Plan / UC Course  I have reviewed the triage vital signs and the nursing notes.  Pertinent labs & imaging results that were available during my care of the patient were reviewed by me and considered in my medical decision making (see chart for details).    Mild persistent reactive airway disease with acute exacerbation.  No respiratory distress, O2 sat 99% on room air.  Treating with prednisone.  Instructed patient to use her albuterol inhaler as directed.  Instructed her to follow-up with her PCP on Monday.  ED precautions given.  Education provided on asthma.  She agrees to plan of care.  Final Clinical Impressions(s) / UC Diagnoses   Final diagnoses:  Mild persistent reactive airway disease with acute exacerbation     Discharge Instructions      Use your albuterol inhaler as directed.  Take the prednisone as directed.    Follow up with your primary care provider on Monday.  Go to the emergency department if you have worsening symptoms.        ED Prescriptions     Medication Sig Dispense Auth. Provider   predniSONE (DELTASONE) 10 MG tablet Take 4 tablets (40 mg total) by mouth daily for 5 days. 20 tablet Mickie Bail, NP      PDMP not reviewed this encounter.   Mickie Bail, NP 04/06/23 [phone]

## 2023-04-10 ENCOUNTER — Telehealth: Payer: Self-pay | Admitting: Nurse Practitioner

## 2023-04-10 DIAGNOSIS — J4531 Mild persistent asthma with (acute) exacerbation: Secondary | ICD-10-CM

## 2023-04-10 MED ORDER — PROMETHAZINE-DM 6.25-15 MG/5ML PO SYRP: 5.0000 mL | ORAL_SOLUTION | Freq: Four times a day (QID) | ORAL | 0 refills | Status: DC | PRN

## 2023-04-10 MED ORDER — PREDNISONE 10 MG PO TABS: ORAL_TABLET | ORAL | 0 refills | Status: DC

## 2023-04-10 MED ORDER — BENZONATATE 200 MG PO CAPS: 200.0000 mg | ORAL_CAPSULE | Freq: Three times a day (TID) | ORAL | 1 refills | Status: DC | PRN

## 2023-04-10 NOTE — Telephone Encounter (Signed)
If mucus is clear and not having any fevers or chills, would hold off on CXR for now. She should complete the prednisone as prescribed. She was just started on it 11/29. We can taper her down to give her a little bit longer course so I will send in 30 mg for 2 days then 20 mg for 2 days then 10 mg for 2 days then stop. I will also send in some cough medicine she can use. She should use the benzonatate every 8 hours for the next few days. Do not drive after taking the promethazine DM cough syrup as this can cause drowsiness.  Viral coughs can linger for a few weeks. I have an appt with her on 12/6, which she should keep so I can listen to her/evaluate her in person. Thanks.

## 2023-04-10 NOTE — Telephone Encounter (Signed)
Lmtcb.

## 2023-04-10 NOTE — Telephone Encounter (Signed)
Patient states having symptoms of cough and mucus. No available appointments at this time. Would like a xray. Pharmacy is [facility]. Patient phone number is [phone].

## 2023-04-10 NOTE — Telephone Encounter (Signed)
Patient advised as below. Nothing further needed.

## 2023-04-10 NOTE — Telephone Encounter (Signed)
How long have your symptoms been going on for? 1 week. Seen at urgent care on 04/06/23. Started on prednisone 40mg  x 5 days.  Any fevers, chills or sweats? No  Any cough? Yes. If so are you getting anything up? What color?  clear Any SOB? Yes  Any wheezing? Yes. Worse at night. Do you monitor your oxygen at home? Do you wear O2? No   What medications do you take? Albuterol inhaler. Breo. And home remedies. Reports mild symptom control. Patient is requesting chest x-ray.  Covid test? No.   Please advise.

## 2023-04-13 ENCOUNTER — Encounter: Payer: Self-pay | Admitting: Nurse Practitioner

## 2023-04-13 ENCOUNTER — Ambulatory Visit
Admission: RE | Admit: 2023-04-13 | Discharge: 2023-04-13 | Disposition: A | Discharge status: Home / Self Care | Payer: PPO | Source: Ambulatory Visit | Attending: Nurse Practitioner | Admitting: Nurse Practitioner

## 2023-04-13 ENCOUNTER — Ambulatory Visit: Payer: PPO | Admitting: Nurse Practitioner

## 2023-04-13 ENCOUNTER — Ambulatory Visit
Admission: RE | Admit: 2023-04-13 | Discharge: 2023-04-13 | Disposition: A | Discharge status: Home / Self Care | Payer: PPO | Source: Ambulatory Visit | Attending: Nurse Practitioner

## 2023-04-13 VITALS — BP 128/78 | HR 78 | Temp 97.5°F | Ht 67.0 in | Wt 230.4 lb

## 2023-04-13 DIAGNOSIS — R058 Other specified cough: Secondary | ICD-10-CM | POA: Insufficient documentation

## 2023-04-13 MED ORDER — AZITHROMYCIN 250 MG PO TABS: ORAL_TABLET | ORAL | 0 refills | Status: DC

## 2023-04-13 NOTE — Progress Notes (Signed)
@Patient  ID: Susan Miller, female    DOB: 12/14/1945, 77 y.o.   MRN: [id number]  Chief Complaint  Patient presents with   Follow-up    Cough with clear sputum for 2 weeks. Wheezing. SOB when she coughs. No fevers, chills or sweats.    Referring provider: Dale Barnhart, MD  HPI: 77 year old female, never smoker followed for chronic cough and mild reactive airways disease. She is a patient of Dr. Jayme Miller and last seen in office 02/14/2023 by [facility]. Past medical history significant for HTN, OSA, GERd, depression, HLD, IDA.   TEST/EVENTS:  05/16/2021 CXR: coarsened interstitial markings. No acute airspace disease 07/08/2021 PFT: FVC 82, FEV1 87, ratio 80, TLC 91, DLCO 81. No bronchodilator response in FEV1; did have midflow reversibility.   12/19/2021: Susan Miller with Susan Ridges NP. Started on flovent at last visit. Doing well. Did notice improvement and is experiencing less wheezing. Uses albuterol once a week for SOB/chest tightness. Heat seems to exacerbate symptoms. Still has some occasional cough, dry. Takes allegra as needed but sometimes makes her sleepy.   11/14/2022: Ov with Susan Sobotka NP for follow up. Doing relatively similar to last visit. She still has trouble with her cough, which is dry. She does notice it gets worse with increased nasal drainage. She was mowing the grass yesterday and feels like she has more congestion/postnasal drainage today. She still gets shortness of breath with exertion, mainly uphill climbing/strenuous activities. Does fine with household chores. Not noticing much wheezing. She denies any chest congestion, leg swelling, orthopnea, PND. She is using Arnuity daily and albuterol rescue once a week.  FeNO 24 ppb  02/14/2023: Ov with Susan Hawbaker NP for follow up.  At her last visit, we had stepped up her inhaler to dual therapy regimen.  Unfortunately, Susan Miller was not covered.  Susan Miller was identified as a formulary alternative.  She was unable to tolerate this.  It made her cough more so  she is currently back on her Arnuity.  She feels about the same as she did last time I saw her.  She does feel like her nasal symptoms are little bit better with the addition of the Astelin nasal spray.  Not noticing any wheezing.  Cough remains dry.  Using albuterol 1-2 times a week, which does help. She has a history of mild sleep apnea.  She had been referred to ENT by her PCP for evaluation for inspire.  After reviewing this further, she opted against going.  She does have difficulties with her sleep at night from time to time. Has been unable to tolerate CPAP due to PTSD.  She does not want to repeat a sleep study at this point.  Feels like her symptoms are tolerable.  She does feel like some of her daytime fatigue is just related to being relatively sedentary.  She is trying to work on being a little more active.  04/13/2023: Today - follow up/acute Discussed the use of AI scribe software for clinical note transcription with the patient, who gave verbal consent to proceed.  History of Present Illness   The patient presents with a persistent cough. Symptoms started a little over two weeks ago. She was seen at Urgent Care and treated with prednisone burst. She then called the office and was prescribed taper and cough medications. She does feel like her chest tightness has improved since she started the prednisone. She's currently on 30 mg daily. The cough is described as hard and sometimes barky, producing a significant  amount of clear sputum. The patient denies any associated fever, chills, or hemoptysis. She does feel a little more short winded than usual, usually with the cough. Has noticed an occasional wheeze. She is taking the promethazine DM cough syrup but had trouble swallowing the benzonatate.   The patient also reports a sensation of fullness in the head, which she describes as feeling "puffy." She has been using some nasal sprays but not consistently. She also feels like her ears are full of  water. She has some clear sinus drainage. She also feels like she has brain fog. No headaches, sore throat, ear pain, or facial tenderness. Eating and drinking well.   The patient's son, who lives with her, had a similar illness.    She is using her Susan Miller, which she does think was helping her breathing prior to her getting sick. Using her albuterol a few times a day.       Allergies  Allergen Reactions   Codeine Hives   Iodinated Contrast Media Swelling   Nitrofurantoin Monohyd Macro Nausea Only    Immunization History  Administered Date(s) Administered   Fluad Quad(high Dose 65+) 01/31/2019, 01/29/2020, 01/25/2022   Influenza, High Dose Seasonal PF 01/11/2016, 04/27/2017, 01/18/2018   Influenza,inj,Quad PF,6+ Mos 02/24/2013, 02/27/2014, 02/22/2015   PFIZER(Purple Top)SARS-COV-2 Vaccination 06/23/2019, 07/14/2019, 01/06/2022   Pneumococcal Conjugate-13 11/06/2016   Pneumococcal Polysaccharide-23 12/06/2017   RSV,unspecified 01/06/2022    Past Medical History:  Diagnosis Date   Allergy    Depression    History of chicken pox    HLD (hyperlipidemia)    Hx: UTI (urinary tract infection)    Hypertension    Sleep apnea     Tobacco History: Social History   Tobacco Use  Smoking Status Never  Smokeless Tobacco Never   Counseling given: Not Answered   Outpatient Medications Prior to Visit  Medication Sig Dispense Refill   albuterol (VENTOLIN HFA) 108 (90 Base) MCG/ACT inhaler INHALE 2 PUFFS INTO THE LUNGS EVERY 6 HOURS AS NEEDED FOR WHEEZING OR SHORTNESS OF BREATH 18 g 2   azelastine (ASTELIN) 0.1 % nasal spray Place 2 sprays into both nostrils 2 (two) times daily. Use in each nostril as directed 30 mL 6   esomeprazole (NEXIUM) 40 MG capsule TAKE 1 CAPSULE DAILY AT 12 NOON 90 capsule 2   estradiol (ESTRACE) 0.1 MG/GM vaginal cream Estrogen Cream Instruction Discard applicator Apply pea sized amount to tip of finger to urethra before bed. Wash hands well after application.  Use Monday, Wednesday and Friday 42.5 g 12   fluticasone furoate-vilanterol (BREO ELLIPTA) 100-25 MCG/ACT AEPB Inhale 1 puff into the lungs daily. 30 each 5   hydrochlorothiazide (HYDRODIURIL) 25 MG tablet TAKE 1/2 TABLET(12.5 MG) BY MOUTH DAILY 45 tablet 1   losartan (COZAAR) 25 MG tablet TAKE 1 TABLET(25 MG) BY MOUTH DAILY 90 tablet 2   predniSONE (DELTASONE) 10 MG tablet Complete 40 mg daily as previously prescribed then take 3 tabs for 2 days, 2 tabs for 2 days, then 1 tab for 2 days, then stop 12 tablet 0   promethazine-dextromethorphan (PROMETHAZINE-DM) 6.25-15 MG/5ML syrup Take 5 mLs by mouth 4 (four) times daily as needed. 118 mL 0   rosuvastatin (CRESTOR) 10 MG tablet TAKE 1 TABLET(10 MG) BY MOUTH DAILY 90 tablet 2   sertraline (ZOLOFT) 25 MG tablet TAKE 1 TABLET BY MOUTH EVERY DAY 90 tablet 1   benzonatate (TESSALON) 200 MG capsule Take 1 capsule (200 mg total) by mouth 3 (three) times daily  as needed for cough. (Patient not taking: Reported on 04/13/2023) 30 capsule 1   ARNUITY ELLIPTA 100 MCG/ACT AEPB Inhale 1 puff into the lungs daily. (Patient not taking: Reported on 04/06/2023)     No facility-administered medications prior to visit.     Review of Systems:   Constitutional: No weight loss or gain, night sweats, fevers, chills, or lassitude. +fatigue  HEENT: No headaches, difficulty swallowing, tooth/dental problems, or sore throat. No sneezing, itching, ear ache. +nasal congestion, post nasal drip, ear pressure/fullness  CV:  No chest pain, orthopnea, PND, swelling in lower extremities, anasarca, dizziness, palpitations, syncope Resp: +shortness of breath with exertion; productive cough; occasional wheeze. No hemoptysis. No chest wall deformity GI:  No heartburn, indigestion GU: No dysuria, change in color of urine, urgency or frequency. Skin: No rash, lesions, ulcerations MSK:  No joint pain or swelling.   Neuro: No dizziness or lightheadedness.  Psych: No depression or  anxiety. Mood stable.     Physical Exam:  BP 128/78 (BP Location: Right Arm, Cuff Size: Large)   Pulse 78   Temp (!) 97.5 F (36.4 C)   Ht 5\' 7"  (1.702 m)   Wt 230 lb 6.4 oz (104.5 kg)   SpO2 98%   BMI 36.09 kg/m   GEN: Pleasant, interactive, well-kempt; obese; in no acute distress. HEENT:  Normocephalic and atraumatic. EACs patent b/l. TM with serous fluid present b/l; no erythema or exudate. PERRLA. Sclera white. Nasal turbinates erythematous, moist and patent bilaterally. Clear rhinorrhea present. Oropharynx pink and moist, without exudate or edema. No lesions, ulcerations.  NECK:  Supple w/ fair ROM. No JVD present. Normal carotid impulses w/o bruits. Thyroid symmetrical with no goiter or nodules palpated. No lymphadenopathy.   CV: RRR, no m/r/g, no peripheral edema. Pulses intact, +2 bilaterally. No cyanosis, pallor or clubbing. PULMONARY:  Unlabored, regular breathing. Clear bilaterally A&P w/o wheezes/rales/rhonchi. Croupy cough. No accessory muscle use.  GI: BS present and normoactive. Soft, non-tender to palpation. No organomegaly or masses detected.  MSK: No erythema, warmth or tenderness. Cap refil <2 sec all extrem. No deformities or joint swelling noted.  Neuro: A/Ox3. No focal deficits noted.   Skin: Warm, no lesions or rashe Psych: Normal affect and behavior. Judgement and thought content appropriate.     Lab Results:  CBC    Component Value Date/Time   WBC 8.1 03/21/2023 1138   WBC 8.9 09/04/2022 1304   RBC 3.98 03/21/2023 1138   HGB 12.1 03/21/2023 1138   HGB 12.3 04/27/2017 1130   HCT 36.7 03/21/2023 1138   HCT 36.7 04/27/2017 1130   PLT 253 03/21/2023 1138   PLT 294 04/27/2017 1130   MCV 92.2 03/21/2023 1138   MCV 89 04/27/2017 1130   MCH 30.4 03/21/2023 1138   MCHC 33.0 03/21/2023 1138   RDW 13.0 03/21/2023 1138   RDW 13.6 04/27/2017 1130   LYMPHSABS 2.3 03/21/2023 1138   LYMPHSABS 2.9 04/27/2017 1130   MONOABS 0.7 03/21/2023 1138   EOSABS 0.2  03/21/2023 1138   EOSABS 0.2 04/27/2017 1130   BASOSABS 0.0 03/21/2023 1138   BASOSABS 0.0 04/27/2017 1130    BMET    Component Value Date/Time   NA 137 03/21/2023 1138   NA 147 (H) 04/23/2017 0759   K 3.9 03/21/2023 1138   CL 102 03/21/2023 1138   CO2 27 03/21/2023 1138   GLUCOSE 97 03/21/2023 1138   BUN 18 03/21/2023 1138   BUN 19 04/23/2017 0759   CREATININE 1.05 (H) 03/21/2023  1138   CALCIUM 9.3 03/21/2023 1138   GFRNONAA 55 (L) 03/21/2023 1138   GFRAA 58 (L) 06/12/2019 2030    BNP    Component Value Date/Time   BNP 90.0 11/09/2018 0849     Imaging:  No results found.  Administration History     None           No data to display          Lab Results  Component Value Date   NITRICOXIDE 24 11/14/2022        Assessment & Plan:   No problem-specific Assessment & Plan notes found for this encounter. Assessment and Plan    Post Viral Cough/Asthmatic bronchitis  Persistent cough with clear sputum, no fever, chills, or hemoptysis. Symptoms likely post-viral cough with possible atypical infection. Previous prednisone treatment provided some relief. FeNO nl today. Explained Z-Pak covers atypical infections like pertussis, which can cause prolonged cough. Emphasized completing the antibiotic course and potential need for further evaluation if symptoms persist. Complete steroids as previously prescribed. Cough control regimen. Target sinus symptoms to manage upper airway irritation. CXR today.  - Prescribe Z-Pak (azithromycin) - Order chest x-ray - Recommend guaifenesin (Mucinex) OTC - Advise saline nasal rinses 1-2 times daily - Restart Flonase nasal spray, 2 sprays each nostril once daily - Cough control regimen with promethazine DM and benzonatate  - Complete prednisone course  - Continue Breo 1 puff daily - Continue albuterol PRN SOB/wheeze - Action plan in place  Serous otitis  Fluid in ears causing fullness sensation, likely secondary to upper  respiratory infection. No signs of infection noted. Discussed saline nasal rinses and Flonase to alleviate symptoms. Monitor for worsening - Recommend saline nasal rinses 1-2 times daily - Prescribe Flonase nasal spray, 2 sprays each nostril once daily  URI See above plan   Follow-up - Schedule follow-up appointment in 2 weeks - Advise to call if symptoms do not improve or worsen.       I spent 38 minutes of dedicated to the care of this patient on the date of this encounter to include pre-visit review of records, face-to-face time with the patient discussing conditions above, post visit ordering of testing, clinical documentation with the electronic health record, making appropriate referrals as documented, and communicating necessary findings to members of the patients care team.  Noemi Chapel, NP 04/13/2023  Pt aware and understands NP's role.

## 2023-04-13 NOTE — Patient Instructions (Addendum)
Continue Albuterol inhaler 2 puffs every 6 hours as needed for shortness of breath or wheezing. Notify if symptoms persist despite rescue inhaler/neb use.  Continue Nexium 1 capsule daily Continue Breo 1 puff daily. Brush tongue and rinse mouth afterwards   -Saline nasal rinses 1-2 times a day for nasal congestion/postnasal drip until symptoms improve. Use bottled distilled water. Follow with flonase nasal spray 2 sprays each nostril 20-30 minutes after the rinse in the mornings. You can also use your astelin nasal spray 2 sprays each nostril Twice daily if you feel like you're still having a lot of congestion/drainage  -Guaifenesin 600 mg twice daily over the counter as needed until congestion improves -Azithromycin - take 2 tabs on day one then 1 tab daily for four additional days. Take with food -Complete prednisone as prescribed -Benzonatate 1 capsule Three times a day for cough. Take with yogurt or applesauce to help you swallow these -Continue promethazine DM cough syrup 5 mL every 6 hours as needed for cough. Do not drive after taking as this may cause drowsiness   Upper airway cough syndrome: Suppress your cough to allow your larynx (voice box) to heal.  Limit talking for the next few days. Avoid throat clearing. Work on cough suppression with the above recommended suppressants.  Use sugar free hard candies or non-menthol cough drops during this time to soothe your throat.  Warm tea with honey and lemon.   Chest x ray today   Follow up in 2-3 weeks with Dr. Jayme Cloud or Katie Reynalda Canny,NP. Ok to double book [facility] on day not already double booked. If symptoms do not improve or worsen, please contact office for sooner follow up or seek emergency care.

## 2023-04-14 NOTE — Progress Notes (Signed)
Agree with the details of the visit as noted by Katherine Cobb, NP. ° °C. Laura Yasin Ducat, MD °Advanced Bronchoscopy °PCCM [facility] ° °

## 2023-04-23 ENCOUNTER — Other Ambulatory Visit (INDEPENDENT_AMBULATORY_CARE_PROVIDER_SITE_OTHER): Payer: PPO

## 2023-04-23 DIAGNOSIS — D649 Anemia, unspecified: Secondary | ICD-10-CM

## 2023-04-23 DIAGNOSIS — R739 Hyperglycemia, unspecified: Secondary | ICD-10-CM

## 2023-04-23 DIAGNOSIS — E7800 Pure hypercholesterolemia, unspecified: Secondary | ICD-10-CM

## 2023-04-23 LAB — HEPATIC FUNCTION PANEL
ALT: 13 U/L (ref 0–35)
AST: 14 U/L (ref 0–37)
Albumin: 4 g/dL (ref 3.5–5.2)
Alkaline Phosphatase: 65 U/L (ref 39–117)
Bilirubin, Direct: 0.1 mg/dL (ref 0.0–0.3)
Total Bilirubin: 0.7 mg/dL (ref 0.2–1.2)
Total Protein: 6.5 g/dL (ref 6.0–8.3)

## 2023-04-23 LAB — LIPID PANEL
Cholesterol: 134 mg/dL (ref 0–200)
HDL: 48.7 mg/dL (ref >39.00)
LDL Cholesterol: 64 mg/dL (ref 0–99)
NonHDL: 85.49
Total CHOL/HDL Ratio: 3
Triglycerides: 107 mg/dL (ref 0.0–149.0)
VLDL: 21.4 mg/dL (ref 0.0–40.0)

## 2023-04-23 LAB — HEMOGLOBIN A1C: Hgb A1c MFr Bld: 6.8 % — ABNORMAL HIGH (ref 4.6–6.5)

## 2023-04-23 LAB — BASIC METABOLIC PANEL
BUN: 13 mg/dL (ref 6–23)
CO2: 30 meq/L (ref 19–32)
Calcium: 9.5 mg/dL (ref 8.4–10.5)
Chloride: 103 meq/L (ref 96–112)
Creatinine, Ser: 1.04 mg/dL (ref 0.40–1.20)
GFR: 51.98 mL/min — ABNORMAL LOW (ref >60.00)
Glucose, Bld: 119 mg/dL — ABNORMAL HIGH (ref 70–99)
Potassium: 4.5 meq/L (ref 3.5–5.1)
Sodium: 142 meq/L (ref 135–145)

## 2023-04-23 LAB — TSH: TSH: 1.72 u[IU]/mL (ref 0.35–5.50)

## 2023-04-25 ENCOUNTER — Encounter: Payer: Self-pay | Admitting: Internal Medicine

## 2023-04-25 ENCOUNTER — Ambulatory Visit (INDEPENDENT_AMBULATORY_CARE_PROVIDER_SITE_OTHER): Payer: PPO | Admitting: Internal Medicine

## 2023-04-25 VITALS — BP 118/72 | HR 96 | Ht 67.0 in | Wt 226.0 lb

## 2023-04-25 DIAGNOSIS — E7800 Pure hypercholesterolemia, unspecified: Secondary | ICD-10-CM

## 2023-04-25 DIAGNOSIS — D509 Iron deficiency anemia, unspecified: Secondary | ICD-10-CM | POA: Diagnosis not present

## 2023-04-25 DIAGNOSIS — K219 Gastro-esophageal reflux disease without esophagitis: Secondary | ICD-10-CM

## 2023-04-25 DIAGNOSIS — N390 Urinary tract infection, site not specified: Secondary | ICD-10-CM

## 2023-04-25 DIAGNOSIS — J45909 Unspecified asthma, uncomplicated: Secondary | ICD-10-CM | POA: Diagnosis not present

## 2023-04-25 DIAGNOSIS — I10 Essential (primary) hypertension: Secondary | ICD-10-CM

## 2023-04-25 DIAGNOSIS — I700 Atherosclerosis of aorta: Secondary | ICD-10-CM | POA: Diagnosis not present

## 2023-04-25 DIAGNOSIS — F320 Major depressive disorder, single episode, mild: Secondary | ICD-10-CM

## 2023-04-25 DIAGNOSIS — Z0000 Encounter for general adult medical examination without abnormal findings: Secondary | ICD-10-CM | POA: Diagnosis not present

## 2023-04-25 MED ORDER — PREDNISONE 10 MG PO TABS
ORAL_TABLET | ORAL | 0 refills | Status: DC
Start: 2023-04-25 — End: 2023-06-05

## 2023-04-25 MED ORDER — ESOMEPRAZOLE MAGNESIUM 40 MG PO CPDR: 40.0000 mg | DELAYED_RELEASE_CAPSULE | Freq: Two times a day (BID) | ORAL | 2 refills | Status: DC

## 2023-04-25 NOTE — Assessment & Plan Note (Signed)
Physical today 04/25/23.  Mammogram 12/23/21- Birads I.   Colonoscopy 02/14/22

## 2023-04-25 NOTE — Progress Notes (Signed)
Subjective:    [name], female    DOB: 1945-06-30, 77 y.o.   MRN: [id number]  Patient here for  Chief Complaint  Patient presents with   Annual Exam    CPE. Pt is not fasting, pt had coffee with cream. Persistent cough with chest congestion.  Pt will have surgery in January. Skin cancer on right side of face near eye.     HPI Here for a physical exam. Last saw pulmonary 04/13/23 - felt to have post viral cough/asthmatic bronchitis. Treated with zpak, mucinex, saline nasal spray and flonase. Recommended to complete prednisone and continue breo and albuterol prn. Comes in today stating she is better, but still with persistent cough and congestion. Head congestion. Chest congestion.  Drainage. Still with coughing fits. Saw ortho 01/24/23 - OA - knees. S/p cortisone injection. Saw Dr Apolinar Junes 03/08/22 - w/up for frequent UTIs. Had renal ultrasound 03/2022 - ok. Cystoscopy 04/05/22 - unremarkable. Placed on suppression abx. Saw Dr Orlie [name] - 09/05/22 - anemia.  Suspect IDA. Colonoscopy and EGD in October 2023 did not reveal any significant pathology. Patient could not tolerate video endoscopy. S/p IV venofer.  Last check - hgb and iron studies wnl. Recommended f/u in 6 months.  Discussed labs. Low carb diet and exercise - discussed.    Past Medical History:  Diagnosis Date   Allergy    Depression    History of chicken pox    HLD (hyperlipidemia)    Hx: UTI (urinary tract infection)    Hypertension    Sleep apnea    Past Surgical History:  Procedure Laterality Date   COLONOSCOPY WITH PROPOFOL N/A 06/20/2016   Procedure: COLONOSCOPY WITH PROPOFOL;  Surgeon: Midge Minium, MD;  Location: [facility] ENDOSCOPY;  Service: Endoscopy;  Laterality: N/A;   COLONOSCOPY WITH PROPOFOL N/A 02/14/2022   Procedure: COLONOSCOPY WITH PROPOFOL;  Surgeon: Midge Minium, MD;  Location: [facility] ENDOSCOPY;  Service: Endoscopy;  Laterality: N/A;   ESOPHAGOGASTRODUODENOSCOPY N/A 02/14/2022   Procedure:  ESOPHAGOGASTRODUODENOSCOPY (EGD);  Surgeon: Midge Minium, MD;  Location: [facility] ENDOSCOPY;  Service: Endoscopy;  Laterality: N/A;   ESOPHAGOGASTRODUODENOSCOPY (EGD) WITH PROPOFOL N/A 06/20/2016   Procedure: ESOPHAGOGASTRODUODENOSCOPY (EGD) WITH PROPOFOL;  Surgeon: Midge Minium, MD;  Location: [facility] ENDOSCOPY;  Service: Endoscopy;  Laterality: N/A;   TONSILLECTOMY     as a child   TUMOR REMOVAL     behind ear   Family History  Problem Relation Age of Onset   Hypertension Mother    Cancer Sister        colon cancer   Hypertension Sister    Diabetes Sister    Hypertension Brother    Hypertension Maternal Aunt    Breast cancer Maternal Aunt 60   Hypertension Maternal Uncle    Stroke Maternal Grandmother    Hypertension Maternal Grandmother    Social History   Socioeconomic History   Marital status: Divorced    Spouse name: Not on file   Number of children: Not on file   Years of education: Not on file   Highest education level: Associate degree: occupational, Scientist, product/process development, or vocational program  Occupational History   Not on file  Tobacco Use   Smoking status: Never   Smokeless tobacco: Never  Vaping Use   Vaping status: Never Used  Substance and Sexual Activity   Alcohol use: No    Alcohol/week: 0.0 standard drinks of alcohol   Drug use: No   Sexual activity: Not Currently  Other Topics Concern  Not on file  Social History Narrative   Not on file   Social Drivers of Health   Financial Resource Strain: Low Risk  (04/21/2023)   Overall Financial Resource Strain (CARDIA)    Difficulty of Paying Living Expenses: Not very hard  Food Insecurity: No Food Insecurity (04/21/2023)   Hunger Vital Sign    Worried About Running Out of Food in the Last Year: Never true    Ran Out of Food in the Last Year: Never true  Transportation Needs: No Transportation Needs (04/21/2023)   PRAPARE - Administrator, Civil Service (Medical): No    Lack of Transportation (Non-Medical):  No  Physical Activity: Inactive (04/21/2023)   Exercise Vital Sign    Days of Exercise per Week: 0 days    Minutes of Exercise per Session: 0 min  Stress: No Stress Concern Present (04/21/2023)   [employer] of Occupational Health - Occupational Stress Questionnaire    Feeling of Stress : Only a little  Social Connections: Socially Isolated (04/21/2023)   Social Connection and Isolation Panel [NHANES]    Frequency of Communication with Friends and Family: More than three times a week    Frequency of Social Gatherings with Friends and Family: Once a week    Attends Religious Services: Never    Database administrator or Organizations: No    Attends Banker Meetings: Never    Marital Status: Divorced     Review of Systems  Constitutional:  Negative for appetite change and unexpected weight change.  HENT:  Positive for congestion. Negative for sinus pressure and sore throat.   Eyes:  Negative for pain and visual disturbance.  Respiratory:  Positive for cough. Negative for chest tightness and shortness of breath.   Cardiovascular:  Negative for chest pain and palpitations.  Gastrointestinal:  Negative for abdominal pain, diarrhea, nausea and vomiting.       Acid reflux - nexium.   Genitourinary:  Negative for difficulty urinating and dysuria.  Musculoskeletal:  Negative for myalgias.       Knee pain - OA - seeing ortho.   Skin:  Negative for color change and rash.  Neurological:  Negative for dizziness and headaches.  Hematological:  Negative for adenopathy. Does not bruise/bleed easily.  Psychiatric/Behavioral:  Negative for agitation and dysphoric mood.        Objective:     BP 118/72   Pulse 96   Ht 5\' 7"  (1.702 m)   Wt 226 lb (102.5 kg)   SpO2 98%   BMI 35.40 kg/m  Wt Readings from Last 3 Encounters:  04/25/23 226 lb (102.5 kg)  04/13/23 230 lb 6.4 oz (104.5 kg)  03/22/23 226 lb 11.2 oz (102.8 kg)    Physical Exam Vitals reviewed.   Constitutional:      General: She is not in acute distress.    Appearance: Normal appearance. She is well-developed.  HENT:     Head: Normocephalic and atraumatic.     Right Ear: External ear normal.     Left Ear: External ear normal.     Mouth/Throat:     Pharynx: No oropharyngeal exudate or posterior oropharyngeal erythema.  Eyes:     General: No scleral icterus.       Right eye: No discharge.        Left eye: No discharge.     Conjunctiva/sclera: Conjunctivae normal.  Neck:     Thyroid: No thyromegaly.  Cardiovascular:     Rate  and Rhythm: Normal rate and regular rhythm.  Pulmonary:     Effort: No tachypnea, accessory muscle usage or respiratory distress.     Breath sounds: Normal breath sounds. No decreased breath sounds or wheezing.  Chest:  Breasts:    Right: No inverted nipple, mass, nipple discharge or tenderness (no axillary adenopathy).     Left: No inverted nipple, mass, nipple discharge or tenderness (no axilarry adenopathy).  Abdominal:     General: Bowel sounds are normal.     Palpations: Abdomen is soft.     Tenderness: There is no abdominal tenderness.  Musculoskeletal:        General: No swelling or tenderness.     Cervical back: Neck supple.  Lymphadenopathy:     Cervical: No cervical adenopathy.  Skin:    Findings: No erythema or rash.  Neurological:     Mental Status: She is alert and oriented to person, place, and time.  Psychiatric:        Mood and Affect: Mood normal.        Behavior: Behavior normal.      Outpatient Encounter Medications as of 04/25/2023  Medication Sig   albuterol (VENTOLIN HFA) 108 (90 Base) MCG/ACT inhaler INHALE 2 PUFFS INTO THE LUNGS EVERY 6 HOURS AS NEEDED FOR WHEEZING OR SHORTNESS OF BREATH   azelastine (ASTELIN) 0.1 % nasal spray Place 2 sprays into both nostrils 2 (two) times daily. Use in each nostril as directed   estradiol (ESTRACE) 0.1 MG/GM vaginal cream Estrogen Cream Instruction Discard applicator Apply pea  sized amount to tip of finger to urethra before bed. Wash hands well after application. Use Monday, Wednesday and Friday   fluticasone furoate-vilanterol (BREO ELLIPTA) 100-25 MCG/ACT AEPB Inhale 1 puff into the lungs daily.   hydrochlorothiazide (HYDRODIURIL) 25 MG tablet TAKE 1/2 TABLET(12.5 MG) BY MOUTH DAILY   losartan (COZAAR) 25 MG tablet TAKE 1 TABLET(25 MG) BY MOUTH DAILY   predniSONE (DELTASONE) 10 MG tablet Take 4 tablets x 1 day and then decrease by 1/2 tablet per day until down to zero mg.   promethazine-dextromethorphan (PROMETHAZINE-DM) 6.25-15 MG/5ML syrup Take 5 mLs by mouth 4 (four) times daily as needed.   rosuvastatin (CRESTOR) 10 MG tablet TAKE 1 TABLET(10 MG) BY MOUTH DAILY   sertraline (ZOLOFT) 25 MG tablet TAKE 1 TABLET BY MOUTH EVERY DAY   [DISCONTINUED] azithromycin (ZITHROMAX) 250 MG tablet Take 2 tabs on day one then 1 tab daily for four additional days   [DISCONTINUED] esomeprazole (NEXIUM) 40 MG capsule TAKE 1 CAPSULE DAILY AT 12 NOON   [DISCONTINUED] predniSONE (DELTASONE) 10 MG tablet Complete 40 mg daily as previously prescribed then take 3 tabs for 2 days, 2 tabs for 2 days, then 1 tab for 2 days, then stop   esomeprazole (NEXIUM) 40 MG capsule Take 1 capsule (40 mg total) by mouth 2 (two) times daily before a meal. TAKE 1 CAPSULE DAILY AT 12 NOON   [DISCONTINUED] benzonatate (TESSALON) 200 MG capsule Take 1 capsule (200 mg total) by mouth 3 (three) times daily as needed for cough. (Patient not taking: Reported on 04/13/2023)   No facility-administered encounter medications on file as of 04/25/2023.     Lab Results  Component Value Date   WBC 8.1 03/21/2023   HGB 12.1 03/21/2023   HCT 36.7 03/21/2023   PLT 253 03/21/2023   GLUCOSE 119 (H) 04/23/2023   CHOL 134 04/23/2023   TRIG 107.0 04/23/2023   HDL 48.70 04/23/2023   LDLCALC 64 04/23/2023  ALT 13 04/23/2023   AST 14 04/23/2023   NA 142 04/23/2023   K 4.5 04/23/2023   CL 103 04/23/2023   CREATININE  1.04 04/23/2023   BUN 13 04/23/2023   CO2 30 04/23/2023   TSH 1.72 04/23/2023   HGBA1C 6.8 (H) 04/23/2023    DG Chest 2 View Result Date: 04/13/2023 CLINICAL DATA:  Post viral cough for 2 weeks. EXAM: CHEST - 2 VIEW COMPARISON:  Radiographs 05/16/2021 and 06/09/2020. FINDINGS: The heart size and mediastinal contours are normal. The lungs are clear. There is no pleural effusion or pneumothorax. No acute osseous findings are identified. Stable degenerative changes in the spine with a focal osteophyte projecting over the midthoracic spine on the lateral view. IMPRESSION: No evidence of acute cardiopulmonary process. Electronically Signed   By: Carey Bullocks M.D.   On: 04/13/2023 12:06       Assessment & Plan:  Health care maintenance Assessment & Plan: Physical today 04/25/23.  Mammogram 12/23/21- Birads I.   Colonoscopy 02/14/22    Essential hypertension, benign Assessment & Plan: On losartan and hctz.  Continue current medication regimen.  Follow pressures.     Aortic atherosclerosis (HCC) Assessment & Plan: Continue crestor.    Frequent UTI Assessment & Plan:  Saw Dr Apolinar Junes 03/08/22 - w/up for frequent UTIs. Had renal ultrasound 03/2022 - ok. Cystoscopy 04/05/22 - unremarkable. Placed on suppression abx.    Gastroesophageal reflux disease, unspecified whether esophagitis present Assessment & Plan: On nexium. Still with some acid reflux.  Increase nexium to bid.  Could be aggravating her cough.    Hypercholesteremia Assessment & Plan: Continue crestor.  Low cholesterol diet and exercise.  Follow lipid panel and liver function tests.    Iron deficiency anemia, unspecified iron deficiency anemia type Assessment & Plan:  Saw Dr Orlie [name] - 09/05/22 - anemia.  Suspect IDA. Colonoscopy and EGD in October 2023 did not reveal any significant pathology. Patient could not tolerate video endoscopy. S/p IV venofer.  Last check - hgb and iron studies wnl. Recommended f/u.    Major  depressive disorder, single episode, mild (HCC) Assessment & Plan: Continue zoloft.  Stable. Follow.    Asthmatic bronchitis without complication, unspecified asthma severity, unspecified whether persistent Assessment & Plan: Post viral cough/asthmatic bronchitis - saw pulmonary 04/13/23 - felt to have post viral cough/asthmatic bronchitis. Treated with zpak, mucinex, saline nasal spray and flonase. Recommended to complete prednisone and continue breo and albuterol prn. Comes in today stating she is better, but still with persistent cough and congestion. Head congestion. Chest congestion.  Drainage. Still with coughing fits. Discussed.  Treat acid reflux. Continue breo and albuterol prn. Prednisone taper as directed.  Follow.  Call with update.    Other orders -     predniSONE; Take 4 tablets x 1 day and then decrease by 1/2 tablet per day until down to zero mg.  Dispense: 18 tablet; Refill: 0 -     Esomeprazole Magnesium; Take 1 capsule (40 mg total) by mouth 2 (two) times daily before a meal. TAKE 1 CAPSULE DAILY AT 12 NOON  Dispense: 60 capsule; Refill: 2     Dale [place] Path, MD

## 2023-04-29 ENCOUNTER — Encounter: Payer: Self-pay | Admitting: Internal Medicine

## 2023-04-29 DIAGNOSIS — J45909 Unspecified asthma, uncomplicated: Secondary | ICD-10-CM | POA: Insufficient documentation

## 2023-04-29 NOTE — Assessment & Plan Note (Signed)
Continue crestor 

## 2023-04-29 NOTE — Assessment & Plan Note (Signed)
Saw Dr Apolinar Junes 03/08/22 - w/up for frequent UTIs. Had renal ultrasound 03/2022 - ok. Cystoscopy 04/05/22 - unremarkable. Placed on suppression abx.

## 2023-04-29 NOTE — Assessment & Plan Note (Signed)
Continue crestor.  Low cholesterol diet and exercise.  Follow lipid panel and liver function tests.  

## 2023-04-29 NOTE — Assessment & Plan Note (Addendum)
On nexium. Still with some acid reflux.  Increase nexium to bid.  Could be aggravating her cough.

## 2023-04-29 NOTE — Assessment & Plan Note (Signed)
Saw Dr Orlie [name] - 09/05/22 - anemia.  Suspect IDA. Colonoscopy and EGD in October 2023 did not reveal any significant pathology. Patient could not tolerate video endoscopy. S/p IV venofer.  Last check - hgb and iron studies wnl. Recommended f/u.

## 2023-04-29 NOTE — Assessment & Plan Note (Signed)
Post viral cough/asthmatic bronchitis - saw pulmonary 04/13/23 - felt to have post viral cough/asthmatic bronchitis. Treated with zpak, mucinex, saline nasal spray and flonase. Recommended to complete prednisone and continue breo and albuterol prn. Comes in today stating she is better, but still with persistent cough and congestion. Head congestion. Chest congestion.  Drainage. Still with coughing fits. Discussed.  Treat acid reflux. Continue breo and albuterol prn. Prednisone taper as directed.  Follow.  Call with update.

## 2023-04-29 NOTE — Assessment & Plan Note (Signed)
Continue zoloft.  Stable.  Follow.   

## 2023-04-29 NOTE — Assessment & Plan Note (Signed)
On losartan and hctz.  Continue current medication regimen.  Follow pressures.   

## 2023-05-03 ENCOUNTER — Ambulatory Visit: Payer: PPO | Admitting: Pulmonary Disease

## 2023-05-08 ENCOUNTER — Encounter: Payer: Self-pay | Admitting: Internal Medicine

## 2023-05-08 ENCOUNTER — Other Ambulatory Visit: Payer: Self-pay | Admitting: Internal Medicine

## 2023-05-08 MED ORDER — ESOMEPRAZOLE MAGNESIUM 40 MG PO CPDR: 40.0000 mg | DELAYED_RELEASE_CAPSULE | Freq: Two times a day (BID) | ORAL | 2 refills | Status: DC

## 2023-05-09 ENCOUNTER — Other Ambulatory Visit: Payer: Self-pay | Admitting: Internal Medicine

## 2023-05-12 ENCOUNTER — Other Ambulatory Visit: Payer: Self-pay | Admitting: Internal Medicine

## 2023-05-16 DIAGNOSIS — L814 Other melanin hyperpigmentation: Secondary | ICD-10-CM | POA: Diagnosis not present

## 2023-05-16 DIAGNOSIS — C44311 Basal cell carcinoma of skin of nose: Secondary | ICD-10-CM | POA: Diagnosis not present

## 2023-05-16 DIAGNOSIS — L988 Other specified disorders of the skin and subcutaneous tissue: Secondary | ICD-10-CM | POA: Diagnosis not present

## 2023-05-16 DIAGNOSIS — L578 Other skin changes due to chronic exposure to nonionizing radiation: Secondary | ICD-10-CM | POA: Diagnosis not present

## 2023-06-05 ENCOUNTER — Encounter: Payer: Self-pay | Admitting: Pulmonary Disease

## 2023-06-05 ENCOUNTER — Ambulatory Visit: Payer: PPO | Admitting: Pulmonary Disease

## 2023-06-05 VITALS — BP 140/70 | HR 104 | Temp 97.8°F | Ht 67.0 in | Wt 224.8 lb

## 2023-06-05 DIAGNOSIS — J683 Other acute and subacute respiratory conditions due to chemicals, gases, fumes and vapors: Secondary | ICD-10-CM

## 2023-06-05 DIAGNOSIS — J45991 Cough variant asthma: Secondary | ICD-10-CM

## 2023-06-05 DIAGNOSIS — R058 Other specified cough: Secondary | ICD-10-CM

## 2023-06-05 DIAGNOSIS — R059 Cough, unspecified: Secondary | ICD-10-CM

## 2023-06-05 LAB — POCT EXHALED NITRIC OXIDE: FeNO level (ppb): 18

## 2023-06-05 MED ORDER — BREZTRI AEROSPHERE 160-9-4.8 MCG/ACT IN AERO: 2.0000 | INHALATION_SPRAY | Freq: Two times a day (BID) | RESPIRATORY_TRACT | 2 refills | Status: DC

## 2023-06-05 NOTE — Progress Notes (Signed)
Subjective:    [name], female    DOB: 13-Feb-1946, 78 y.o.   MRN: [id number]  Patient Care Team: Dale Lucien, MD as PCP - General (Internal Medicine) Debbe Odea, MD as PCP - Cardiology (Cardiology) Loree Fee, [facility] as Pharmacist (Pharmacist) Noemi Chapel, NP as Nurse Practitioner (Nurse Practitioner) Salena Saner, MD as Consulting Physician (Pulmonary Disease)  Chief Complaint  Patient presents with   Follow-up    Cough is better the last 4-5 days.  Mucous is clear.  Chest is sore.  Triggered by talking, eating, breathing.  Wakes up at night at times.      BACKGROUND: Patient is a 78 year old lifelong never smoker who has been previously seen here for issues with cough variant asthma.  I last saw the patient in June 2023 which was also her initial visit with me.  Subsequently she had been seen by our nurse practitioners.  She was last seen on 13 April 2023 by Rhunette Croft, NP for recurrent cough after a viral illness.  At her prior visit was advised to continue [facility] and was placed on nasal congestion protocol.  Patient presents for follow-up from that visit.  HPI Discussed the use of AI scribe software for clinical note transcription with the patient, who gave verbal consent to proceed.  History of Present Illness   The patient, with cough variant asthma, presents with a persistent cough and mucus production.  She has been experiencing a persistent cough with significant mucus production since around Christmas, following a viral illness around Thanksgiving. The cough is described as 'hacking' and is particularly severe at night, though it can occur during the day as well. It can be triggered by taking a breath, leading to spasms. Recently, there has been some improvement in her symptoms, particularly in the last two to three days, allowing her to sleep better at night.  Her main issues continue to have copious mucus which seems to arise from  her sinuses and then settle in her chest.  She also notices that when the cough is productive is with profuse clear sputum.  No hemoptysis.  She has not had any chest pain, no orthopnea or paroxysmal nocturnal dyspnea, no lower extremity edema.  She has been using Breo, which she believes is helping as she is using her other inhaler less frequently. She also uses a second inhaler, which she feels provides some relief. Prednisone was prescribed during this period, which she thinks helped slightly, but did not resolve the mucus issue. She takes generic Nexium once a day for reflux and has not been taking any allergy medications like Claritin or Zyrtec.  She has a history of cough variant asthma, which was noted to be mild in the past. She also experiences hoarseness that comes and goes.  The severity of her cough has led her to isolate herself socially due to its unpredictability and awkwardness.      DATA 05/16/2021 CXR PA and lateral: No acute cardiopulmonary disease 07/08/2021 PFTs: FEV1 2.10 L or 87% predicted, FVC 2.62 L or 82% predicted, FEV1/FVC 80%, there is a mild bronchodilator response.  Lung volumes were normal except for ERV at 12% this is indicative of obesity.  There is a significant small airways component.  Diffusion capacity was normal.  Consistent with reactive airways disease/asthma 04/13/2023 chest x-ray PA and lateral: No active cardiopulmonary disease.   Review of Systems A 10 point review of systems was performed and it is as noted  above otherwise negative.   Past Medical History:  Diagnosis Date   Allergy    Depression    History of chicken pox    HLD (hyperlipidemia)    Hx: UTI (urinary tract infection)    Hypertension    Sleep apnea     Past Surgical History:  Procedure Laterality Date   COLONOSCOPY WITH PROPOFOL N/A 06/20/2016   Procedure: COLONOSCOPY WITH PROPOFOL;  Surgeon: Midge Minium, MD;  Location: [facility] ENDOSCOPY;  Service: Endoscopy;  Laterality: N/A;    COLONOSCOPY WITH PROPOFOL N/A 02/14/2022   Procedure: COLONOSCOPY WITH PROPOFOL;  Surgeon: Midge Minium, MD;  Location: [facility] ENDOSCOPY;  Service: Endoscopy;  Laterality: N/A;   ESOPHAGOGASTRODUODENOSCOPY N/A 02/14/2022   Procedure: ESOPHAGOGASTRODUODENOSCOPY (EGD);  Surgeon: Midge Minium, MD;  Location: [facility] ENDOSCOPY;  Service: Endoscopy;  Laterality: N/A;   ESOPHAGOGASTRODUODENOSCOPY (EGD) WITH PROPOFOL N/A 06/20/2016   Procedure: ESOPHAGOGASTRODUODENOSCOPY (EGD) WITH PROPOFOL;  Surgeon: Midge Minium, MD;  Location: [facility] ENDOSCOPY;  Service: Endoscopy;  Laterality: N/A;   TONSILLECTOMY     as a child   TUMOR REMOVAL     behind ear    Patient Active Problem List   Diagnosis Date Noted   Asthmatic bronchitis 04/29/2023   Rash 12/23/2022   Reactive airways dysfunction syndrome (HCC) 12/23/2022   Foot pain 12/23/2022   Allergic rhinitis 11/14/2022   Mild reactive airways disease 10/11/2022   Polyp of ascending colon    Knee pain, right 12/13/2021   Major depressive disorder, single episode, mild (HCC) 12/11/2021   Iron deficiency anemia, unspecified 04/10/2021   Aortic atherosclerosis (HCC) 04/10/2021   UTI (urinary tract infection) 03/27/2021   Cutaneous skin tags 12/12/2020   B12 deficiency 12/12/2020   OSA (obstructive sleep apnea) 04/18/2020   Numbness and tingling of both feet 02/20/2020   Daytime hypersomnolence 01/29/2020   Dysuria 12/07/2019   Snoring 12/07/2019   Hypercholesteremia 10/09/2019   Back pain 11/15/2018   SOB (shortness of breath) 11/15/2018   Decreased GFR 11/14/2018   Hyperbilirubinemia 11/14/2018   Abdominal bloating 12/09/2017   Osteopenia 04/29/2017   Hyperglycemia 04/27/2017   Reflux esophagitis    Heartburn    Special screening for malignant neoplasms, colon    Hair loss 10/26/2015   Fatigue 10/21/2015   Vitamin D deficiency 03/15/2015   Weight loss counseling, encounter for 02/22/2015   Facial erythema 07/27/2014   Health care maintenance  07/05/2014   BP (high blood pressure) 03/26/2014   Headache 03/08/2014   Eyelid lesion 03/08/2014   Shingles 11/16/2013   Light headedness 06/22/2013   Cough 05/18/2013   GERD (gastroesophageal reflux disease) 05/18/2013   Swelling of extremity 03/01/2013   Left knee pain 03/01/2013   Essential hypertension, benign 12/15/2012   Frequent UTI 12/15/2012   Mild depression 12/15/2012   Environmental allergies 12/15/2012    Family History  Problem Relation Age of Onset   Hypertension Mother    Cancer Sister        colon cancer   Hypertension Sister    Diabetes Sister    Hypertension Brother    Hypertension Maternal Aunt    Breast cancer Maternal Aunt 60   Hypertension Maternal Uncle    Stroke Maternal Grandmother    Hypertension Maternal Grandmother     Social History   Tobacco Use   Smoking status: Never   Smokeless tobacco: Never  Substance Use Topics   Alcohol use: No    Alcohol/week: 0.0 standard drinks of alcohol    Allergies  Allergen Reactions  Codeine Hives   Iodinated Contrast Media Swelling   Nitrofurantoin Monohyd Macro Nausea Only    Current Meds  Medication Sig   albuterol (VENTOLIN HFA) 108 (90 Base) MCG/ACT inhaler INHALE 2 PUFFS INTO THE LUNGS EVERY 6 HOURS AS NEEDED FOR WHEEZING OR SHORTNESS OF BREATH   azelastine (ASTELIN) 0.1 % nasal spray Place 2 sprays into both nostrils 2 (two) times daily. Use in each nostril as directed   esomeprazole (NEXIUM) 40 MG capsule TAKE 1 CAPSULE BY MOUTH DAILY AT 12 NOON   estradiol (ESTRACE) 0.1 MG/GM vaginal cream Estrogen Cream Instruction Discard applicator Apply pea sized amount to tip of finger to urethra before bed. Wash hands well after application. Use Monday, Wednesday and Friday   fluticasone furoate-vilanterol (BREO ELLIPTA) 100-25 MCG/ACT AEPB Inhale 1 puff into the lungs daily.   hydrochlorothiazide (HYDRODIURIL) 25 MG tablet TAKE 1/2 TABLET BY MOUTH EVERY DAY   losartan (COZAAR) 25 MG tablet TAKE 1  TABLET(25 MG) BY MOUTH DAILY   rosuvastatin (CRESTOR) 10 MG tablet TAKE 1 TABLET(10 MG) BY MOUTH DAILY   sertraline (ZOLOFT) 25 MG tablet TAKE 1 TABLET BY MOUTH EVERY DAY    Immunization History  Administered Date(s) Administered   Fluad Quad(high Dose 65+) 01/31/2019, 01/29/2020, 01/25/2022   Influenza, High Dose Seasonal PF 01/11/2016, 04/27/2017, 01/18/2018   Influenza,inj,Quad PF,6+ Mos 02/24/2013, 02/27/2014, 02/22/2015   PFIZER(Purple Top)SARS-COV-2 Vaccination 06/23/2019, 07/14/2019, 01/06/2022   Pneumococcal Conjugate-13 11/06/2016   Pneumococcal Polysaccharide-23 12/06/2017   RSV,unspecified 01/06/2022        Objective:     BP (!) 140/70 (BP Location: Right Arm, Patient Position: Sitting, Cuff Size: Normal)   Pulse (!) 104   Temp 97.8 F (36.6 C) (Oral)   Ht 5\' 7"  (1.702 m)   Wt 224 lb 12.8 oz (102 kg)   SpO2 96%   BMI 35.21 kg/m   SpO2: 96 % O2 Device: None (Room air)  GENERAL: Well-developed, obese woman, no acute distress, fully ambulatory, no conversational dyspnea. HEAD: Normocephalic, atraumatic.  EYES: Pupils equal, round, reactive to light.  No scleral icterus.  MOUTH: Few chipped teeth, multiple fillings, otherwise dentition intact, oral mucosa moist. NECK: Supple. No thyromegaly. Trachea midline. No JVD.  No adenopathy. PULMONARY: Good air entry bilaterally.  No adventitious sounds. CARDIOVASCULAR: S1 and S2. Regular rate and rhythm.  ABDOMEN: Benign. MUSCULOSKELETAL: No joint deformity, no clubbing, no edema.  NEUROLOGIC: No overt focal deficit, no gait disturbance, speech is fluent. SKIN: Intact,warm,dry. PSYCH: Mood and behavior normal.   Nitric Oxide: 18 ppb (previous 24 ppb)  Assessment & Plan:     ICD-10-CM   1. Cough, unspecified type  R05.9 POCT EXHALED NITRIC OXIDE    2. Post-viral cough syndrome  R05.8 Pulmonary Function Test [facility] Only    3. Reactive airways dysfunction syndrome (HCC)  J68.3 Pulmonary Function Test [facility] Only       Orders Placed This Encounter  Procedures   POCT EXHALED NITRIC OXIDE    Meds ordered this encounter  Medications   Budeson-Glycopyrrol-Formoterol (BREZTRI AEROSPHERE) 160-9-4.8 MCG/ACT AERO    Sig: Inhale 2 puffs into the lungs in the morning and at bedtime.    Dispense:  10.7 g    Refill:  2   Discussion:    Post-Infectious Cough Persistent cough with significant mucus production since late December, following a viral infection. Symptoms include severe nocturnal coughing and occasional spasms triggered by deep breaths. Limited relief from previous prednisone treatment. Recent improvement noted. Condition expected to resolve in 8-12  weeks from initial insult. - Schedule pulmonary function tests - Discontinue Breo - Initiate new inhaler twice daily,Breztri 2 puffs twice a day - Provide coupon for new inhaler - Recommend over-the-counter Delsym for cough - Recommend Zyrtec syrup at night (patient cannot swallow pills)  Cough Variant Asthma Mild asthma, currently managed with inhalers. Recent exacerbation likely due to viral infection. Reduced inhaler usage indicates some symptom control. - Continue emergency inhaler as needed  Gastroesophageal Reflux Disease (GERD) GERD managed with generic Nexium once daily. No current exacerbation. - Continue generic Nexium once daily  Follow-up - Follow-up appointment in 3-4 weeks.     Advised if symptoms do not improve or worsen, to please contact office for sooner follow up or seek emergency care.    I spent 40 minutes of dedicated to the care of this patient on the date of this encounter to include pre-visit review of records, face-to-face time with the patient discussing conditions above, post visit ordering of testing, clinical documentation with the electronic health record, making appropriate referrals as documented, and communicating necessary findings to members of the patients care team.   C. Danice Goltz, MD Advanced  Bronchoscopy PCCM [facility]    *This note was dictated using voice recognition software/Dragon.  Despite best efforts to proofread, errors can occur which can change the meaning. Any transcriptional errors that result from this process are unintentional and may not be fully corrected at the time of dictation.

## 2023-06-05 NOTE — Patient Instructions (Addendum)
VISIT SUMMARY:  During today's visit, we discussed your persistent cough and mucus production, which started after a viral illness around Thanksgiving. We also reviewed your asthma management and GERD treatment. You reported some recent improvement in your symptoms, particularly at night.  YOUR PLAN:  -POST-INFECTIOUS COUGH: A post-infectious cough is a cough that persists after a viral infection due to the bronchial lining needing time to heal. We expect your cough to resolve in 8-12 weeks. We will schedule pulmonary function tests, discontinue Breo, and start you on a new inhaler to be used twice daily. Samples of the new inhaler will be provided. Additionally, you can use over-the-counter Delsym for your cough and take Zyrtec syrup at night.  -ASTHMA/AIRWAYS REACTIVITY: Asthma is a condition where your airways narrow and swell, producing extra mucus. Your asthma appears to be mild and is currently managed with inhalers. You should continue using your emergency inhaler as needed.  -GASTROESOPHAGEAL REFLUX DISEASE (GERD): GERD is a condition where stomach acid frequently flows back into the tube connecting your mouth and stomach. Your GERD is managed with generic Nexium, which you should continue taking once daily.  INSTRUCTIONS:  Please schedule a follow-up appointment in 3-4 weeks. We will also arrange for pulmonary function tests to better understand your lung function.

## 2023-06-23 ENCOUNTER — Telehealth: Payer: PPO | Admitting: Physician Assistant

## 2023-06-23 ENCOUNTER — Ambulatory Visit: Payer: PPO

## 2023-06-23 DIAGNOSIS — R3989 Other symptoms and signs involving the genitourinary system: Secondary | ICD-10-CM

## 2023-06-23 MED ORDER — CEPHALEXIN 500 MG PO CAPS
500.0000 mg | ORAL_CAPSULE | Freq: Two times a day (BID) | ORAL | 0 refills | Status: AC
Start: 2023-06-23 — End: 2023-06-30

## 2023-06-23 NOTE — Patient Instructions (Signed)
 [name], thank you for joining Laure Kidney, PA-C for today's virtual visit.  While this provider is not your primary care provider (PCP), if your PCP is located in our provider database this encounter information will be shared with them immediately following your visit.   A [facility] MyChart account gives you access to today's visit and all your visits, tests, and labs performed at [facility] " click here if you don't have a [facility] MyChart account or go to [email or web address]  Consent: (Patient) Susan Miller provided verbal consent for this virtual visit at the beginning of the encounter.  Current Medications:  Current Outpatient Medications:    cephALEXin (KEFLEX) 500 MG capsule, Take 1 capsule (500 mg total) by mouth 2 (two) times daily for 7 days., Disp: 14 capsule, Rfl: 0   albuterol (VENTOLIN HFA) 108 (90 Base) MCG/ACT inhaler, INHALE 2 PUFFS INTO THE LUNGS EVERY 6 HOURS AS NEEDED FOR WHEEZING OR SHORTNESS OF BREATH, Disp: 18 g, Rfl: 2   azelastine (ASTELIN) 0.1 % nasal spray, Place 2 sprays into both nostrils 2 (two) times daily. Use in each nostril as directed, Disp: 30 mL, Rfl: 6   Budeson-Glycopyrrol-Formoterol (BREZTRI AEROSPHERE) 160-9-4.8 MCG/ACT AERO, Inhale 2 puffs into the lungs in the morning and at bedtime., Disp: 10.7 g, Rfl: 2   esomeprazole (NEXIUM) 40 MG capsule, TAKE 1 CAPSULE BY MOUTH DAILY AT 12 NOON, Disp: 90 capsule, Rfl: 0   estradiol (ESTRACE) 0.1 MG/GM vaginal cream, Estrogen Cream Instruction Discard applicator Apply pea sized amount to tip of finger to urethra before bed. Wash hands well after application. Use Monday, Wednesday and Friday, Disp: 42.5 g, Rfl: 12   hydrochlorothiazide (HYDRODIURIL) 25 MG tablet, TAKE 1/2 TABLET BY MOUTH EVERY DAY, Disp: 45 tablet, Rfl: 1   losartan (COZAAR) 25 MG tablet, TAKE 1 TABLET(25 MG) BY MOUTH DAILY, Disp: 90 tablet, Rfl: 2   rosuvastatin (CRESTOR) 10 MG tablet, TAKE 1 TABLET(10 MG) BY  MOUTH DAILY, Disp: 90 tablet, Rfl: 2   sertraline (ZOLOFT) 25 MG tablet, TAKE 1 TABLET BY MOUTH EVERY DAY, Disp: 90 tablet, Rfl: 1   Medications ordered in this encounter:  Meds ordered this encounter  Medications   cephALEXin (KEFLEX) 500 MG capsule    Sig: Take 1 capsule (500 mg total) by mouth 2 (two) times daily for 7 days.    Dispense:  14 capsule    Refill:  0    Supervising Provider:   Merrilee Jansky [[id number]]     *If you need refills on other medications prior to your next appointment, please contact your pharmacy*  Follow-Up: Call back or seek an in-person evaluation if the symptoms worsen or if the condition fails to improve as anticipated.  [facility] [phone]  Other Instructions Please report to ER if your symptoms do not improve.    If you have been instructed to have an in-person evaluation today at a local Urgent Care facility, please use the link below. It will take you to a list of all of our available [facility], including address, phone number and hours of operation. Please do not delay care.  [facility]  If you or a family member do not have a primary care provider, use the link below to schedule a visit and establish care. When you choose a [facility] primary care physician or advanced practice provider, you gain a long-term partner in health. Find a Primary Care Provider  Learn more about [facility]'s in-office and virtual care options: [facility] Get Care Now

## 2023-06-23 NOTE — Progress Notes (Signed)
 Virtual Visit Consent   Susan Miller, you are scheduled for a virtual visit with a [facility] provider today. Just as with appointments in the office, your consent must be obtained to participate. Your consent will be active for this visit and any virtual visit you may have with one of our providers in the next 365 days. If you have a MyChart account, a copy of this consent can be sent to you electronically.  As this is a virtual visit, video technology does not allow for your provider to perform a traditional examination. This may limit your provider's ability to fully assess your condition. If your provider identifies any concerns that need to be evaluated in person or the need to arrange testing (such as labs, EKG, etc.), we will make arrangements to do so. Although advances in technology are sophisticated, we cannot ensure that it will always work on either your end or our end. If the connection with a video visit is poor, the visit may have to be switched to a telephone visit. With either a video or telephone visit, we are not always able to ensure that we have a secure connection.  By engaging in this virtual visit, you consent to the provision of healthcare and authorize for your insurance to be billed (if applicable) for the services provided during this visit. Depending on your insurance coverage, you may receive a charge related to this service.  I need to obtain your verbal consent now. Are you willing to proceed with your visit today? Susan Miller has provided verbal consent on 06/23/2023 for a virtual visit (video or telephone). Susan Miller, [place]  Date: 06/23/2023 5:38 PM   Virtual Visit via Video Note   I, Susan Miller, connected with  Susan Miller  ([id number], 03-15-1946) on 06/23/23 at  5:30 PM EST by a video-enabled telemedicine application and verified that I am speaking with the correct person using two identifiers.  Location: Patient: Virtual Visit Location Patient:  Home Provider: Virtual Visit Location Provider: Home Office   I discussed the limitations of evaluation and management by telemedicine and the availability of in person appointments. The patient expressed understanding and agreed to proceed.    History of Present Illness: Susan Miller is a 78 y.o. who identifies as a female who was assigned female at birth, and is being seen today for UTI.  HPI: Urinary Tract Infection  This is a new problem. The current episode started in the past 7 days. The problem occurs every urination. The quality of the pain is described as burning. There has been no fever. She is Not sexually active. There is No history of pyelonephritis. Associated symptoms include frequency and urgency. Pertinent negatives include no discharge or flank pain. She has tried home medications for the symptoms. The treatment provided mild relief. Her past medical history is significant for recurrent UTIs.    Problems:  Patient Active Problem List   Diagnosis Date Noted   Asthmatic bronchitis 04/29/2023   Rash 12/23/2022   Reactive airways dysfunction syndrome (HCC) 12/23/2022   Foot pain 12/23/2022   Allergic rhinitis 11/14/2022   Mild reactive airways disease 10/11/2022   Polyp of ascending colon    Knee pain, right 12/13/2021   Major depressive disorder, single episode, mild (HCC) 12/11/2021   Iron deficiency anemia, unspecified 04/10/2021   Aortic atherosclerosis (HCC) 04/10/2021   UTI (urinary tract infection) 03/27/2021   Cutaneous skin tags 12/12/2020   B12 deficiency 12/12/2020   OSA (obstructive  sleep apnea) 04/18/2020   Numbness and tingling of both feet 02/20/2020   Daytime hypersomnolence 01/29/2020   Dysuria 12/07/2019   Snoring 12/07/2019   Hypercholesteremia 10/09/2019   Back pain 11/15/2018   SOB (shortness of breath) 11/15/2018   Decreased GFR 11/14/2018   Hyperbilirubinemia 11/14/2018   Abdominal bloating 12/09/2017   Osteopenia 04/29/2017    Hyperglycemia 04/27/2017   Reflux esophagitis    Heartburn    Special screening for malignant neoplasms, colon    Hair loss 10/26/2015   Fatigue 10/21/2015   Vitamin D deficiency 03/15/2015   Weight loss counseling, encounter for 02/22/2015   Facial erythema 07/27/2014   Health care maintenance 07/05/2014   BP (high blood pressure) 03/26/2014   Headache 03/08/2014   Eyelid lesion 03/08/2014   Shingles 11/16/2013   Light headedness 06/22/2013   Cough 05/18/2013   GERD (gastroesophageal reflux disease) 05/18/2013   Swelling of extremity 03/01/2013   Left knee pain 03/01/2013   Essential hypertension, benign 12/15/2012   Frequent UTI 12/15/2012   Mild depression 12/15/2012   Environmental allergies 12/15/2012    Allergies:  Allergies  Allergen Reactions   Codeine Hives   Iodinated Contrast Media Swelling   Nitrofurantoin Monohyd Macro Nausea Only   Medications:  Current Outpatient Medications:    cephALEXin (KEFLEX) 500 MG capsule, Take 1 capsule (500 mg total) by mouth 2 (two) times daily for 7 days., Disp: 14 capsule, Rfl: 0   albuterol (VENTOLIN HFA) 108 (90 Base) MCG/ACT inhaler, INHALE 2 PUFFS INTO THE LUNGS EVERY 6 HOURS AS NEEDED FOR WHEEZING OR SHORTNESS OF BREATH, Disp: 18 g, Rfl: 2   azelastine (ASTELIN) 0.1 % nasal spray, Place 2 sprays into both nostrils 2 (two) times daily. Use in each nostril as directed, Disp: 30 mL, Rfl: 6   Budeson-Glycopyrrol-Formoterol (BREZTRI AEROSPHERE) 160-9-4.8 MCG/ACT AERO, Inhale 2 puffs into the lungs in the morning and at bedtime., Disp: 10.7 g, Rfl: 2   esomeprazole (NEXIUM) 40 MG capsule, TAKE 1 CAPSULE BY MOUTH DAILY AT 12 NOON, Disp: 90 capsule, Rfl: 0   estradiol (ESTRACE) 0.1 MG/GM vaginal cream, Estrogen Cream Instruction Discard applicator Apply pea sized amount to tip of finger to urethra before bed. Wash hands well after application. Use Monday, Wednesday and Friday, Disp: 42.5 g, Rfl: 12   hydrochlorothiazide (HYDRODIURIL) 25  MG tablet, TAKE 1/2 TABLET BY MOUTH EVERY DAY, Disp: 45 tablet, Rfl: 1   losartan (COZAAR) 25 MG tablet, TAKE 1 TABLET(25 MG) BY MOUTH DAILY, Disp: 90 tablet, Rfl: 2   rosuvastatin (CRESTOR) 10 MG tablet, TAKE 1 TABLET(10 MG) BY MOUTH DAILY, Disp: 90 tablet, Rfl: 2   sertraline (ZOLOFT) 25 MG tablet, TAKE 1 TABLET BY MOUTH EVERY DAY, Disp: 90 tablet, Rfl: 1  Observations/Objective: Patient is well-developed, well-nourished in no acute distress.  Resting comfortably  at home.  Head is normocephalic, atraumatic.  No labored breathing.  Speech is clear and coherent with logical content.  Patient is alert and oriented at baseline.    Assessment and Plan: 1. Suspected UTI (Primary)  Patient presents with symptoms consistent with acute uncomplicated cystitis. No systemic symptoms. Not septic. Well appearing. Low suspicion for acute pyelonephritis given lack of fever, CVAT, or systemic features. Low suspicion for Miller stone or infected stone. Patient fearful of contracting flu or covid. Risk vs benefits of treatment discussed and I explained to patient that if she doesn't feel better, she should immediately seek care.   Follow Up Instructions: I discussed the assessment and treatment  plan with the patient. The patient was provided an opportunity to ask questions and all were answered. The patient agreed with the plan and demonstrated an understanding of the instructions.  A copy of instructions were sent to the patient via MyChart unless otherwise noted below.    The patient was advised to call back or seek an in-person evaluation if the symptoms worsen or if the condition fails to improve as anticipated.    Susan Kidney, PA-C

## 2023-06-28 ENCOUNTER — Ambulatory Visit: Payer: PPO | Attending: Pulmonary Disease

## 2023-06-28 DIAGNOSIS — R058 Other specified cough: Secondary | ICD-10-CM | POA: Insufficient documentation

## 2023-06-28 DIAGNOSIS — R0609 Other forms of dyspnea: Secondary | ICD-10-CM | POA: Diagnosis not present

## 2023-06-28 DIAGNOSIS — J683 Other acute and subacute respiratory conditions due to chemicals, gases, fumes and vapors: Secondary | ICD-10-CM | POA: Diagnosis not present

## 2023-06-28 LAB — PULMONARY FUNCTION TEST ARMC ONLY
DL/VA % pred: 99 %
DL/VA: 4 ml/min/mmHg/L
DLCO unc % pred: 90 %
DLCO unc: 18.88 ml/min/mmHg
FEF 25-75 Post: 2.36 L/s
FEF 25-75 Pre: 1.87 L/s
FEF2575-%Change-Post: 26 %
FEF2575-%Pred-Post: 136 %
FEF2575-%Pred-Pre: 107 %
FEV1-%Change-Post: 5 %
FEV1-%Pred-Post: 93 %
FEV1-%Pred-Pre: 88 %
FEV1-Post: 2.18 L
FEV1-Pre: 2.07 L
FEV1FVC-%Change-Post: 2 %
FEV1FVC-%Pred-Pre: 103 %
FEV6-%Change-Post: 4 %
FEV6-%Pred-Post: 92 %
FEV6-%Pred-Pre: 88 %
FEV6-Post: 2.74 L
FEV6-Pre: 2.63 L
FEV6FVC-%Change-Post: 0 %
FEV6FVC-%Pred-Post: 104 %
FEV6FVC-%Pred-Pre: 105 %
FVC-%Change-Post: 2 %
FVC-%Pred-Post: 88 %
FVC-%Pred-Pre: 86 %
FVC-Post: 2.75 L
FVC-Pre: 2.69 L
Post FEV1/FVC ratio: 79 %
Post FEV6/FVC ratio: 100 %
Pre FEV1/FVC ratio: 77 %
Pre FEV6/FVC Ratio: 100 %
RV % pred: 77 %
RV: 1.93 L
TLC % pred: 86 %
TLC: 4.75 L

## 2023-06-28 MED ORDER — ALBUTEROL SULFATE (2.5 MG/3ML) 0.083% IN NEBU
2.5000 mg | INHALATION_SOLUTION | Freq: Once | RESPIRATORY_TRACT | Status: AC
  Administered 2023-06-28: 2.5 mg via RESPIRATORY_TRACT
  Filled 2023-06-28: qty 3

## 2023-07-04 ENCOUNTER — Telehealth: Payer: Self-pay | Admitting: Pulmonary Disease

## 2023-07-04 ENCOUNTER — Encounter: Payer: Self-pay | Admitting: Pulmonary Disease

## 2023-07-04 ENCOUNTER — Ambulatory Visit: Payer: PPO | Admitting: Pulmonary Disease

## 2023-07-04 VITALS — BP 116/68 | HR 85 | Temp 96.9°F | Ht 67.0 in | Wt 221.0 lb

## 2023-07-04 DIAGNOSIS — R400 Somnolence: Secondary | ICD-10-CM | POA: Diagnosis not present

## 2023-07-04 DIAGNOSIS — J45991 Cough variant asthma: Secondary | ICD-10-CM | POA: Diagnosis not present

## 2023-07-04 DIAGNOSIS — R490 Dysphonia: Secondary | ICD-10-CM | POA: Diagnosis not present

## 2023-07-04 MED ORDER — MOMETASONE FURO-FORMOTEROL FUM 200-5 MCG/ACT IN AERO: 2.0000 | INHALATION_SPRAY | Freq: Two times a day (BID) | RESPIRATORY_TRACT | 11 refills | Status: DC

## 2023-07-04 MED ORDER — ALBUTEROL SULFATE (2.5 MG/3ML) 0.083% IN NEBU: 2.5000 mg | INHALATION_SOLUTION | RESPIRATORY_TRACT | 2 refills | Status: AC | PRN

## 2023-07-04 MED ORDER — BREYNA 160-4.5 MCG/ACT IN AERO: 2.0000 | INHALATION_SPRAY | Freq: Two times a day (BID) | RESPIRATORY_TRACT | 11 refills | Status: DC

## 2023-07-04 NOTE — Patient Instructions (Addendum)
 VISIT SUMMARY:  Today, we discussed your ongoing issues with hoarseness and cough management related to your gall variant asthma, as well as your concerns about sleep apnea and daytime sleepiness. We reviewed your recent lung function test and blood pressure readings, and we explored potential changes to your asthma treatment and sleep apnea management.  YOUR PLAN:  -COUGH VARIANT ASTHMA: Cough variant asthma is a type of asthma that can cause coughing and hoarseness. We believe your hoarseness may be due to an ingredient in your current inhaler. We will switch your inhaler to [facility], which should not cause hoarseness. Please rinse your mouth with water containing a quarter teaspoon of baking soda after using the inhaler. Additionally, we will send a prescription for a home nebulizer for use as needed.  -SLEEP APNEA: Sleep apnea is a condition where breathing stops and starts during sleep, leading to poor sleep quality and daytime sleepiness. You have mild sleep apnea and difficulty using a CPAP machine due to past trauma. We discussed trying nasal pillows as a less obtrusive option and offered home sleep apnea testing if you are interested. We also mentioned the option of seeing a sleep specialist for further management.  -GENERAL HEALTH MAINTENANCE: Your blood pressure reading of 116/68 is ideal. We discussed your feelings of sleepiness and the importance of managing your sleep apnea. We will continue to monitor your blood pressure and reassess your sleep apnea if your symptoms persist.  INSTRUCTIONS:  Please follow up in three months for a re-evaluation. If you are interested in home sleep apnea testing or need a referral to a sleep specialist, let us know.

## 2023-07-04 NOTE — Progress Notes (Signed)
 Subjective:    [name], female    DOB: 02/27/1946, 78 y.o.   MRN: [id number]  Patient Care Team: Dale Buena Vista, MD as PCP - General (Internal Medicine) Debbe Odea, MD as PCP - Cardiology (Cardiology) Loree Fee, [facility] as Pharmacist (Pharmacist) Noemi Chapel, NP as Nurse Practitioner (Nurse Practitioner) Salena Saner, MD as Consulting Physician (Pulmonary Disease)  Chief Complaint  Patient presents with   Follow-up    Hoarseness. DOE. No wheezing. Occasional cough.     BACKGROUND/INTERVAL: Patient is a 78 year old lifelong never smoker who has been previously seen here for issues with cough variant asthma. I last saw the patient in 78 January 2025, at that time she was treated for postviral cough syndrome and given a trial of Breztri as insurance did not cover Symbicort.   HPI Discussed the use of AI scribe software for clinical note transcription with the patient, who gave verbal consent to proceed.  History of Present Illness   Susan Miller is a 78 year old female with gall variant asthma who presents with hoarseness and cough management.  She has been experiencing hoarseness, which worsened after starting Breztri, she endorses rinsing her mouth thoroughly after each use. She has tried various inhalers, including Breo, but is unsure which one was effective. She prefers managing a mild cough over severe hoarseness, as the latter has been socially embarrassing.  A recent lung function test showed an 88% function, which improved by 5% after using an inhaler.  Overall the testing was normal.  She inquires about the possibility of using a nebulizer at home, as she found it comforting when used previously (during PFT testing).  She mentions that her blood pressure was low recently, which is unusual for her. She takes a daily medication for blood pressure management.  Review of her vital signs however, shows that she is normotensive today.  She  also reports feeling sleepy and has been taking multiple naps during the day, attributing some of this to boredom.  She has a history of mild sleep apnea and was advised to use a CPAP machine, but she finds it difficult to use due to PTSD from past trauma.  She could not tolerate full facemask.  She is interested in exploring less obtrusive options like CPAP with nasal pillows.      DATA 05/16/2021 CXR PA and lateral: No acute cardiopulmonary disease 07/08/2021 PFTs: FEV1 2.10 L or 87% predicted, FVC 2.62 L or 82% predicted, FEV1/FVC 80%, there is a mild bronchodilator response.  Lung volumes were normal except for ERV at 12% this is indicative of obesity.  There is a significant small airways component.  Diffusion capacity was normal.  Consistent with reactive airways disease/asthma 04/13/2023 chest x-ray PA and lateral: No active cardiopulmonary disease. 06/28/2023 PFTs: FEV1 2.07 L or 88% predicted, FVC 2.69 L or 86% predicted, FEV1/FVC 77%, lung volumes and diffusion capacity normal.  Overall normal pulmonary function testing  Review of Systems A 10 point review of systems was performed and it is as noted above otherwise negative.   Patient Active Problem List   Diagnosis Date Noted   Post-viral cough syndrome 06/28/2023   Asthmatic bronchitis 04/29/2023   Rash 12/23/2022   Reactive airways dysfunction syndrome (HCC) 12/23/2022   Foot pain 12/23/2022   Allergic rhinitis 11/14/2022   Mild reactive airways disease 10/11/2022   Polyp of ascending colon    Knee pain, right 12/13/2021   Major depressive disorder, single episode, mild (  HCC) 12/11/2021   Iron deficiency anemia, unspecified 04/10/2021   Aortic atherosclerosis (HCC) 04/10/2021   UTI (urinary tract infection) 03/27/2021   Cutaneous skin tags 12/12/2020   B12 deficiency 12/12/2020   OSA (obstructive sleep apnea) 04/18/2020   Numbness and tingling of both feet 02/20/2020   Daytime hypersomnolence 01/29/2020   Dysuria  12/07/2019   Snoring 12/07/2019   Hypercholesteremia 10/09/2019   Back pain 11/15/2018   SOB (shortness of breath) 11/15/2018   Decreased GFR 11/14/2018   Hyperbilirubinemia 11/14/2018   Abdominal bloating 12/09/2017   Osteopenia 04/29/2017   Hyperglycemia 04/27/2017   Reflux esophagitis    Heartburn    Special screening for malignant neoplasms, colon    Hair loss 10/26/2015   Fatigue 10/21/2015   Vitamin D deficiency 03/15/2015   Weight loss counseling, encounter for 02/22/2015   Facial erythema 07/27/2014   Health care maintenance 07/05/2014   BP (high blood pressure) 03/26/2014   Headache 03/08/2014   Eyelid lesion 03/08/2014   Shingles 11/16/2013   Light headedness 06/22/2013   Cough 05/18/2013   GERD (gastroesophageal reflux disease) 05/18/2013   Swelling of extremity 03/01/2013   Left knee pain 03/01/2013   Essential hypertension, benign 12/15/2012   Frequent UTI 12/15/2012   Mild depression 12/15/2012   Environmental allergies 12/15/2012    Social History   Tobacco Use   Smoking status: Never   Smokeless tobacco: Never  Substance Use Topics   Alcohol use: No    Alcohol/week: 0.0 standard drinks of alcohol    Allergies  Allergen Reactions   Codeine Hives   Iodinated Contrast Media Swelling    Current Meds  Medication Sig   albuterol (VENTOLIN HFA) 108 (90 Base) MCG/ACT inhaler INHALE 2 PUFFS INTO THE LUNGS EVERY 6 HOURS AS NEEDED FOR WHEEZING OR SHORTNESS OF BREATH   azelastine (ASTELIN) 0.1 % nasal spray Place 2 sprays into both nostrils 2 (two) times daily. Use in each nostril as directed   Budeson-Glycopyrrol-Formoterol (BREZTRI AEROSPHERE) 160-9-4.8 MCG/ACT AERO Inhale 2 puffs into the lungs in the morning and at bedtime.   esomeprazole (NEXIUM) 40 MG capsule TAKE 1 CAPSULE BY MOUTH DAILY AT 12 NOON   estradiol (ESTRACE) 0.1 MG/GM vaginal cream Estrogen Cream Instruction Discard applicator Apply pea sized amount to tip of finger to urethra before bed.  Wash hands well after application. Use Monday, Wednesday and Friday   hydrochlorothiazide (HYDRODIURIL) 25 MG tablet TAKE 1/2 TABLET BY MOUTH EVERY DAY   losartan (COZAAR) 25 MG tablet TAKE 1 TABLET(25 MG) BY MOUTH DAILY   rosuvastatin (CRESTOR) 10 MG tablet TAKE 1 TABLET(10 MG) BY MOUTH DAILY   sertraline (ZOLOFT) 25 MG tablet TAKE 1 TABLET BY MOUTH EVERY DAY    Immunization History  Administered Date(s) Administered   Fluad Quad(high Dose 65+) 01/31/2019, 01/29/2020, 01/25/2022   Influenza, High Dose Seasonal PF 01/11/2016, 04/27/2017, 01/18/2018   Influenza,inj,Quad PF,6+ Mos 02/24/2013, 02/27/2014, 02/22/2015   PFIZER(Purple Top)SARS-COV-2 Vaccination 06/23/2019, 07/14/2019, 01/06/2022   Pneumococcal Conjugate-13 11/06/2016   Pneumococcal Polysaccharide-23 12/06/2017   RSV,unspecified 01/06/2022        Objective:     BP 116/68 (BP Location: Right Arm, Cuff Size: Large)   Pulse 85   Temp (!) 96.9 F (36.1 C)   Ht 5\' 7"  (1.702 m)   Wt 221 lb (100.2 kg)   SpO2 99%   BMI 34.61 kg/m   SpO2: 99 % O2 Device: None (Room air)  GENERAL: Well-developed, obese woman, no acute distress, fully ambulatory, no conversational dyspnea.  HEAD: Normocephalic, atraumatic.  EYES: Pupils equal, round, reactive to light.  No scleral icterus.  MOUTH: Few chipped teeth, multiple fillings, otherwise dentition intact, oral mucosa moist. NECK: Supple. No thyromegaly. Trachea midline. No JVD.  No adenopathy. PULMONARY: Good air entry bilaterally.  No adventitious sounds. CARDIOVASCULAR: S1 and S2. Regular rate and rhythm.  ABDOMEN: Benign. MUSCULOSKELETAL: No joint deformity, no clubbing, no edema.  NEUROLOGIC: No overt focal deficit, no gait disturbance, speech is fluent. SKIN: Intact,warm,dry. PSYCH: Mood and behavior normal.    Assessment & Plan:     ICD-10-CM   1. Cough variant asthma  J45.991     2. Hoarseness of voice  R49.0     3. Daytime somnolence  R40.0      Meds ordered  this encounter  Medications   Dulera 200/5 MCG/ACT inhaler    Sig: Inhale 2 puffs into the lungs in the morning and at bedtime.    Dispense:  13 g    Refill:  11   albuterol (PROVENTIL) (2.5 MG/3ML) 0.083% nebulizer solution    Sig: Take 3 mLs (2.5 mg total) by nebulization every 4 (four) hours as needed for wheezing or shortness of breath.    Dispense:  75 mL    Refill:  2   Discussion:    Cough variant Asthma Cough variant asthma with improved cough but persistent hoarseness likely due to inhaler use. Hoarseness potentially caused by glycopyrrolate in the current inhaler. Various inhalers have been used, and she is unsure which one was most effective.  Powdered based inhalers were poorly tolerated.  Switching to [facility], and MDI covered by insurance, which does not contain the ingredient likely causing the hoarseness, is suggested. Advised to rinse mouth with water containing baking soda to prevent hoarseness. Discussed avoiding powder type inhalers as they tend to cause more coughing. - Switch inhaler to [facility] 200/5, 2 inhalations twice a day - Advise rinsing mouth with water containing a quarter teaspoon of baking soda after inhaler use - Send prescription for a home nebulizer with solution for as-needed use  Sleep Apnea Mild sleep apnea with difficulty using CPAP due to past trauma and discomfort with facial masks. Reports increased daytime sleepiness and has not been using CPAP. Discussed the possibility of using nasal pillows and offered to reassess sleep apnea with home testing if interested. Emphasized the importance of managing sleep apnea and mentioned the availability of a sleep specialist for further management. - Offer home sleep apnea testing if interested - Refer to sleep specialist for further management if needed  General Health Maintenance Blood pressure measured at 116/68, considered ideal. Expresses concern about feeling sleepy and taking multiple naps during the day.  Reassured that the blood pressure reading is within the ideal range and discussed the importance of managing sleep apnea. - Monitor blood pressure - Reassess sleep apnea if symptoms persist  Follow-up - Follow-up visit in three months.     Advised if symptoms do not improve or worsen, to please contact office for sooner follow up or seek emergency care.    I spent 30 minutes of dedicated to the care of this patient on the date of this encounter to include pre-visit review of records, face-to-face time with the patient discussing conditions above, post visit ordering of testing, clinical documentation with the electronic health record, making appropriate referrals as documented, and communicating necessary findings to members of the patients care team.     C. Danice Goltz, MD Advanced Bronchoscopy PCCM [facility]    *  This note was generated using voice recognition software/Dragon and/or AI transcription program.  Despite best efforts to proofread, errors can occur which can change the meaning. Any transcriptional errors that result from this process are unintentional and may not be fully corrected at the time of dictation.

## 2023-07-04 NOTE — Telephone Encounter (Signed)
 She cannot use powdered inhalers.  If they do not have generic Symbicort may substitute with Dulera 200/5, 2 inhalations twice a day.

## 2023-07-04 NOTE — Telephone Encounter (Signed)
 Pharmacy does not have the brand [place] (generic Symbicort). Is there a substitute that can be filled instead. If the patient has to have that brand specifically it will have to be sent somewhere else.

## 2023-07-04 NOTE — Telephone Encounter (Signed)
 I have sent in the [facility] and left a detailed message on the patient secure voicemail.  Nothing further needed.

## 2023-07-05 ENCOUNTER — Encounter: Payer: Self-pay | Admitting: Internal Medicine

## 2023-07-12 ENCOUNTER — Encounter: Payer: Self-pay | Admitting: Urology

## 2023-07-12 ENCOUNTER — Encounter: Payer: Self-pay | Admitting: Pulmonary Disease

## 2023-07-13 ENCOUNTER — Ambulatory Visit: Admitting: Physician Assistant

## 2023-07-13 VITALS — BP 122/74 | HR 103

## 2023-07-13 DIAGNOSIS — N390 Urinary tract infection, site not specified: Secondary | ICD-10-CM

## 2023-07-13 LAB — MICROSCOPIC EXAMINATION
Epithelial Cells (non renal): >10 /HPF — AB (ref 0–10)
WBC, UA: >30 /HPF — AB (ref 0–5)

## 2023-07-13 LAB — URINALYSIS, COMPLETE
Bilirubin, UA: NEGATIVE
Glucose, UA: NEGATIVE
Ketones, UA: NEGATIVE
Nitrite, UA: NEGATIVE
Protein,UA: NEGATIVE
Specific Gravity, UA: 1.01 (ref 1.005–1.030)
Urobilinogen, Ur: 0.2 mg/dL (ref 0.2–1.0)
pH, UA: 6 (ref 5.0–7.5)

## 2023-07-13 LAB — BLADDER SCAN AMB NON-IMAGING: Scan Result: 17

## 2023-07-13 MED ORDER — FLUTICASONE-SALMETEROL 250-50 MCG/ACT IN AEPB: 1.0000 | INHALATION_SPRAY | Freq: Two times a day (BID) | RESPIRATORY_TRACT | 6 refills | Status: DC

## 2023-07-13 MED ORDER — SULFAMETHOXAZOLE-TRIMETHOPRIM 200-40 MG/5ML PO SUSP
20.0000 mL | Freq: Two times a day (BID) | ORAL | 0 refills | Status: AC
Start: 2023-07-13 — End: 2023-07-18

## 2023-07-13 NOTE — Telephone Encounter (Signed)
 Generic Advair sent to [facility]. Patient advised. Nothing further needed.

## 2023-07-13 NOTE — Telephone Encounter (Signed)
 Advair 250/50, 1 inhalation twice a day, rinse mouth well after use.

## 2023-07-13 NOTE — Telephone Encounter (Signed)
 I think the issue with the powder based inhalers for her was cough.  We can see if Advair HFA is covered and then if it is, the dose would be 115/21, 2 puffs twice a day, rinse mouth well after use.

## 2023-07-13 NOTE — Progress Notes (Signed)
 07/13/2023 2:36 PM   Susan Miller 1945-12-14 [id number]  CC: Chief Complaint  Patient presents with   Recurrent UTI   HPI: Susan Miller is a 78 y.o. female with PMH recurrent UTI on estrogen cream who presents today for evaluation of possible UTI.   Today she reports 2 days of dysuria, pelvic discomfort, and urinary frequency.  She has taken Azo, which helped.  Notably, she is unable to swallow pills.  In-office UA today positive for trace intact blood and 2+ leukocytes; urine microscopy with >30 WBCs/HPF, 3-10 RBCs/HPF, >10 epithelial cells/hpf, and moderate bacteria. PVR 17mL.  PMH: Past Medical History:  Diagnosis Date   Allergy    Depression    History of chicken pox    HLD (hyperlipidemia)    Hx: UTI (urinary tract infection)    Hypertension    Sleep apnea     Surgical History: Past Surgical History:  Procedure Laterality Date   COLONOSCOPY WITH PROPOFOL N/A 06/20/2016   Procedure: COLONOSCOPY WITH PROPOFOL;  Surgeon: Midge Minium, MD;  Location: [facility] ENDOSCOPY;  Service: Endoscopy;  Laterality: N/A;   COLONOSCOPY WITH PROPOFOL N/A 02/14/2022   Procedure: COLONOSCOPY WITH PROPOFOL;  Surgeon: Midge Minium, MD;  Location: [facility] ENDOSCOPY;  Service: Endoscopy;  Laterality: N/A;   ESOPHAGOGASTRODUODENOSCOPY N/A 02/14/2022   Procedure: ESOPHAGOGASTRODUODENOSCOPY (EGD);  Surgeon: Midge Minium, MD;  Location: [facility] ENDOSCOPY;  Service: Endoscopy;  Laterality: N/A;   ESOPHAGOGASTRODUODENOSCOPY (EGD) WITH PROPOFOL N/A 06/20/2016   Procedure: ESOPHAGOGASTRODUODENOSCOPY (EGD) WITH PROPOFOL;  Surgeon: Midge Minium, MD;  Location: [facility] ENDOSCOPY;  Service: Endoscopy;  Laterality: N/A;   TONSILLECTOMY     as a child   TUMOR REMOVAL     behind ear    Home Medications:  Allergies as of 07/13/2023       Reactions   Codeine Hives   Iodinated Contrast Media Swelling        Medication List        Accurate as of July 13, 2023  2:36 PM. If you have any questions, ask  your nurse or doctor.          albuterol 108 (90 Base) MCG/ACT inhaler Commonly known as: VENTOLIN HFA INHALE 2 PUFFS INTO THE LUNGS EVERY 6 HOURS AS NEEDED FOR WHEEZING OR SHORTNESS OF BREATH   albuterol (2.5 MG/3ML) 0.083% nebulizer solution Commonly known as: PROVENTIL Take 3 mLs (2.5 mg total) by nebulization every 4 (four) hours as needed for wheezing or shortness of breath.   azelastine 0.1 % nasal spray Commonly known as: ASTELIN Place 2 sprays into both nostrils 2 (two) times daily. Use in each nostril as directed   esomeprazole 40 MG capsule Commonly known as: NEXIUM TAKE 1 CAPSULE BY MOUTH DAILY AT 12 NOON   estradiol 0.1 MG/GM vaginal cream Commonly known as: ESTRACE Estrogen Cream Instruction Discard applicator Apply pea sized amount to tip of finger to urethra before bed. Wash hands well after application. Use Monday, Wednesday and Friday   fluticasone-salmeterol 250-50 MCG/ACT Aepb Commonly known as: ADVAIR Inhale 1 puff into the lungs in the morning and at bedtime.   hydrochlorothiazide 25 MG tablet Commonly known as: HYDRODIURIL TAKE 1/2 TABLET BY MOUTH EVERY DAY   losartan 25 MG tablet Commonly known as: COZAAR TAKE 1 TABLET(25 MG) BY MOUTH DAILY   rosuvastatin 10 MG tablet Commonly known as: CRESTOR TAKE 1 TABLET(10 MG) BY MOUTH DAILY   sertraline 25 MG tablet Commonly known as: ZOLOFT TAKE 1 TABLET BY MOUTH EVERY DAY  sulfamethoxazole-trimethoprim 200-40 MG/5ML suspension Commonly known as: BACTRIM Take 20 mLs by mouth 2 (two) times daily for 5 days.        Allergies:  Allergies  Allergen Reactions   Codeine Hives   Iodinated Contrast Media Swelling    Family History: Family History  Problem Relation Age of Onset   Hypertension Mother    Cancer Sister        colon cancer   Hypertension Sister    Diabetes Sister    Hypertension Brother    Hypertension Maternal Aunt    Breast cancer Maternal Aunt 60   Hypertension Maternal  Uncle    Stroke Maternal Grandmother    Hypertension Maternal Grandmother     Social History:   reports that she has never smoked. She has never used smokeless tobacco. She reports that she does not drink alcohol and does not use drugs.  Physical Exam: BP 122/74   Pulse (!) 103   Constitutional:  Alert and oriented, no acute distress, nontoxic appearing HEENT: [place], AT Cardiovascular: No clubbing, cyanosis, or edema Respiratory: Normal respiratory effort, no increased work of breathing Skin: No rashes, bruises or suspicious lesions Neurologic: Grossly intact, no focal deficits, moving all 4 extremities Psychiatric: Normal mood and affect  Laboratory Data: Results for orders placed or performed in visit on 07/13/23  Microscopic Examination   Collection Time: 07/13/23  2:02 PM   Urine  Result Value Ref Range   WBC, UA >30 (A) 0 - 5 /hpf   RBC, Urine 3-10 (A) 0 - 2 /hpf   Epithelial Cells (non renal) >10 (A) 0 - 10 /hpf   Bacteria, UA Moderate (A) None seen/Few  Urinalysis, Complete   Collection Time: 07/13/23  2:02 PM  Result Value Ref Range   Specific Gravity, UA 1.010 1.005 - 1.030   pH, UA 6.0 5.0 - 7.5   Color, UA Yellow Yellow   Appearance Ur Clear Clear   Leukocytes,UA 2+ (A) Negative   Protein,UA Negative Negative/Trace   Glucose, UA Negative Negative   Ketones, UA Negative Negative   RBC, UA Trace (A) Negative   Bilirubin, UA Negative Negative   Urobilinogen, Ur 0.2 0.2 - 1.0 mg/dL   Nitrite, UA Negative Negative   Microscopic Examination See below:   BLADDER SCAN AMB NON-IMAGING   Collection Time: 07/13/23  2:15 PM  Result Value Ref Range   Scan Result 17 ml    Assessment & Plan:   1. Recurrent UTI (Primary) UA appears grossly infected, though contaminated.  She is emptying appropriately.  Will start empiric Bactrim suspension and send for culture for further evaluation.  Will plan for lab visit in about a week to prove resolution of hematuria with culture  appropriate antibiotics. - Urinalysis, Complete - BLADDER SCAN AMB NON-IMAGING - CULTURE, URINE COMPREHENSIVE - sulfamethoxazole-trimethoprim (BACTRIM) 200-40 MG/5ML suspension; Take 20 mLs by mouth 2 (two) times daily for 5 days.  Dispense: 200 mL; Refill: 0   Return in about 1 week (around 07/20/2023) for Lab visit for UA.  Carman Ching, PA-C  [facility] [address] [place], [place] [address] [phone]

## 2023-07-13 NOTE — Telephone Encounter (Signed)
 The system says the [facility] is not on her formulary.

## 2023-07-16 ENCOUNTER — Other Ambulatory Visit: Payer: Self-pay

## 2023-07-16 DIAGNOSIS — N390 Urinary tract infection, site not specified: Secondary | ICD-10-CM

## 2023-07-17 LAB — CULTURE, URINE COMPREHENSIVE

## 2023-07-20 ENCOUNTER — Other Ambulatory Visit

## 2023-07-20 DIAGNOSIS — N390 Urinary tract infection, site not specified: Secondary | ICD-10-CM | POA: Diagnosis not present

## 2023-07-20 LAB — URINALYSIS, COMPLETE
Bilirubin, UA: NEGATIVE
Glucose, UA: NEGATIVE
Ketones, UA: NEGATIVE
Nitrite, UA: NEGATIVE
Protein,UA: NEGATIVE
RBC, UA: NEGATIVE
Specific Gravity, UA: 1.01 (ref 1.005–1.030)
Urobilinogen, Ur: 0.2 mg/dL (ref 0.2–1.0)
pH, UA: 6 (ref 5.0–7.5)

## 2023-07-20 LAB — MICROSCOPIC EXAMINATION: RBC, Urine: NONE SEEN /HPF (ref 0–2)

## 2023-08-15 ENCOUNTER — Other Ambulatory Visit: Payer: Self-pay | Admitting: Internal Medicine

## 2023-08-15 DIAGNOSIS — R208 Other disturbances of skin sensation: Secondary | ICD-10-CM | POA: Diagnosis not present

## 2023-08-15 DIAGNOSIS — D225 Melanocytic nevi of trunk: Secondary | ICD-10-CM | POA: Diagnosis not present

## 2023-08-15 DIAGNOSIS — L570 Actinic keratosis: Secondary | ICD-10-CM | POA: Diagnosis not present

## 2023-08-15 DIAGNOSIS — D2262 Melanocytic nevi of left upper limb, including shoulder: Secondary | ICD-10-CM | POA: Diagnosis not present

## 2023-08-15 DIAGNOSIS — L820 Inflamed seborrheic keratosis: Secondary | ICD-10-CM | POA: Diagnosis not present

## 2023-08-15 DIAGNOSIS — D2261 Melanocytic nevi of right upper limb, including shoulder: Secondary | ICD-10-CM | POA: Diagnosis not present

## 2023-08-15 DIAGNOSIS — D2271 Melanocytic nevi of right lower limb, including hip: Secondary | ICD-10-CM | POA: Diagnosis not present

## 2023-08-15 DIAGNOSIS — Z85828 Personal history of other malignant neoplasm of skin: Secondary | ICD-10-CM | POA: Diagnosis not present

## 2023-08-21 ENCOUNTER — Telehealth: Payer: Self-pay | Admitting: Internal Medicine

## 2023-08-21 ENCOUNTER — Other Ambulatory Visit: Payer: Self-pay

## 2023-08-21 DIAGNOSIS — E7800 Pure hypercholesterolemia, unspecified: Secondary | ICD-10-CM

## 2023-08-21 DIAGNOSIS — R739 Hyperglycemia, unspecified: Secondary | ICD-10-CM

## 2023-08-21 NOTE — Telephone Encounter (Signed)
 Left message to call the office to reschedule. The provider is out of the office.

## 2023-08-28 ENCOUNTER — Other Ambulatory Visit (INDEPENDENT_AMBULATORY_CARE_PROVIDER_SITE_OTHER): Payer: PPO

## 2023-08-28 DIAGNOSIS — R739 Hyperglycemia, unspecified: Secondary | ICD-10-CM | POA: Diagnosis not present

## 2023-08-28 DIAGNOSIS — E7800 Pure hypercholesterolemia, unspecified: Secondary | ICD-10-CM

## 2023-08-28 LAB — BASIC METABOLIC PANEL WITH GFR
BUN: 16 mg/dL (ref 6–23)
CO2: 30 meq/L (ref 19–32)
Calcium: 9.8 mg/dL (ref 8.4–10.5)
Chloride: 105 meq/L (ref 96–112)
Creatinine, Ser: 1.1 mg/dL (ref 0.40–1.20)
GFR: 48.48 mL/min — ABNORMAL LOW (ref >60.00)
Glucose, Bld: 107 mg/dL — ABNORMAL HIGH (ref 70–99)
Potassium: 4.3 meq/L (ref 3.5–5.1)
Sodium: 142 meq/L (ref 135–145)

## 2023-08-28 LAB — LIPID PANEL
Cholesterol: 114 mg/dL (ref 0–200)
HDL: 45.6 mg/dL (ref >39.00)
LDL Cholesterol: 51 mg/dL (ref 0–99)
NonHDL: 68.41
Total CHOL/HDL Ratio: 3
Triglycerides: 85 mg/dL (ref 0.0–149.0)
VLDL: 17 mg/dL (ref 0.0–40.0)

## 2023-08-28 LAB — HEPATIC FUNCTION PANEL
ALT: 13 U/L (ref 0–35)
AST: 18 U/L (ref 0–37)
Albumin: 4.2 g/dL (ref 3.5–5.2)
Alkaline Phosphatase: 66 U/L (ref 39–117)
Bilirubin, Direct: 0 mg/dL (ref 0.0–0.3)
Total Bilirubin: 0.4 mg/dL (ref 0.2–1.2)
Total Protein: 7 g/dL (ref 6.0–8.3)

## 2023-08-28 LAB — HEMOGLOBIN A1C: Hgb A1c MFr Bld: 6.4 % (ref 4.6–6.5)

## 2023-08-30 ENCOUNTER — Ambulatory Visit: Payer: PPO | Admitting: Internal Medicine

## 2023-08-31 ENCOUNTER — Ambulatory Visit (INDEPENDENT_AMBULATORY_CARE_PROVIDER_SITE_OTHER): Admitting: Internal Medicine

## 2023-08-31 VITALS — BP 128/70 | HR 84 | Temp 97.9°F | Resp 16 | Ht 67.0 in | Wt 225.8 lb

## 2023-08-31 DIAGNOSIS — J683 Other acute and subacute respiratory conditions due to chemicals, gases, fumes and vapors: Secondary | ICD-10-CM | POA: Diagnosis not present

## 2023-08-31 DIAGNOSIS — D509 Iron deficiency anemia, unspecified: Secondary | ICD-10-CM | POA: Diagnosis not present

## 2023-08-31 DIAGNOSIS — Z1231 Encounter for screening mammogram for malignant neoplasm of breast: Secondary | ICD-10-CM

## 2023-08-31 DIAGNOSIS — E7800 Pure hypercholesterolemia, unspecified: Secondary | ICD-10-CM

## 2023-08-31 DIAGNOSIS — R739 Hyperglycemia, unspecified: Secondary | ICD-10-CM

## 2023-08-31 DIAGNOSIS — I10 Essential (primary) hypertension: Secondary | ICD-10-CM | POA: Diagnosis not present

## 2023-08-31 DIAGNOSIS — K2100 Gastro-esophageal reflux disease with esophagitis, without bleeding: Secondary | ICD-10-CM | POA: Diagnosis not present

## 2023-08-31 DIAGNOSIS — F320 Major depressive disorder, single episode, mild: Secondary | ICD-10-CM | POA: Diagnosis not present

## 2023-08-31 DIAGNOSIS — I700 Atherosclerosis of aorta: Secondary | ICD-10-CM | POA: Diagnosis not present

## 2023-08-31 MED ORDER — ROSUVASTATIN CALCIUM 10 MG PO TABS: 10.0000 mg | ORAL_TABLET | Freq: Every day | ORAL | 2 refills | Status: DC

## 2023-08-31 MED ORDER — ESOMEPRAZOLE MAGNESIUM 40 MG PO CPDR: 40.0000 mg | DELAYED_RELEASE_CAPSULE | Freq: Every day | ORAL | 3 refills | Status: DC

## 2023-08-31 MED ORDER — SERTRALINE HCL 25 MG PO TABS
ORAL_TABLET | ORAL | 1 refills | Status: DC
Start: 2023-08-31 — End: 2024-03-23

## 2023-08-31 MED ORDER — LOSARTAN POTASSIUM 25 MG PO TABS: ORAL_TABLET | ORAL | 2 refills | Status: DC

## 2023-08-31 NOTE — Progress Notes (Signed)
 Subjective:    [name], female    DOB: 18-Sep-1945, 78 y.o.   MRN: [id number]  Patient here for  Chief Complaint  Patient presents with   Medical Management of Chronic Issues    HPI Here for a scheduled follow up - follow up regarding hypercholesterolemia and hypertension. Continues zoloft  for depression. Had f/u with urology 07/13/23 - f/u for recurring UTIs. Treated with bactrim  for acute infection. Had f/u with pulmonary 07/04/23 - f/u cough variant asthma. Was having hoarseness. Per note, inhaler changed to Breyna . Breathing stable. Reports increased stress. Family stress. Discussed. Not sleeping.  Had anxiety attack recently. On sertraline . Discussed increasing dose.    Past Medical History:  Diagnosis Date   Allergy    Depression    History of chicken pox    HLD (hyperlipidemia)    Hx: UTI (urinary tract infection)    Hypertension    Sleep apnea    Past Surgical History:  Procedure Laterality Date   COLONOSCOPY WITH PROPOFOL  N/A 06/20/2016   Procedure: COLONOSCOPY WITH PROPOFOL ;  Surgeon: Marnee Sink, MD;  Location: [facility] ENDOSCOPY;  Service: Endoscopy;  Laterality: N/A;   COLONOSCOPY WITH PROPOFOL  N/A 02/14/2022   Procedure: COLONOSCOPY WITH PROPOFOL ;  Surgeon: Marnee Sink, MD;  Location: [facility] ENDOSCOPY;  Service: Endoscopy;  Laterality: N/A;   ESOPHAGOGASTRODUODENOSCOPY N/A 02/14/2022   Procedure: ESOPHAGOGASTRODUODENOSCOPY (EGD);  Surgeon: Marnee Sink, MD;  Location: [facility] ENDOSCOPY;  Service: Endoscopy;  Laterality: N/A;   ESOPHAGOGASTRODUODENOSCOPY (EGD) WITH PROPOFOL  N/A 06/20/2016   Procedure: ESOPHAGOGASTRODUODENOSCOPY (EGD) WITH PROPOFOL ;  Surgeon: Marnee Sink, MD;  Location: [facility] ENDOSCOPY;  Service: Endoscopy;  Laterality: N/A;   TONSILLECTOMY     as a child   TUMOR REMOVAL     behind ear   Family History  Problem Relation Age of Onset   Hypertension Mother    Cancer Sister        colon cancer   Hypertension Sister    Diabetes Sister     Hypertension Brother    Hypertension Maternal Aunt    Breast cancer Maternal Aunt 60   Hypertension Maternal Uncle    Stroke Maternal Grandmother    Hypertension Maternal Grandmother    Social History   Socioeconomic History   Marital status: Divorced    Spouse name: Not on file   Number of children: Not on file   Years of education: Not on file   Highest education level: Associate degree: occupational, Scientist, product/process development, or vocational program  Occupational History   Not on file  Tobacco Use   Smoking status: Never   Smokeless tobacco: Never  Vaping Use   Vaping status: Never Used  Substance and Sexual Activity   Alcohol use: No    Alcohol/week: 0.0 standard drinks of alcohol   Drug use: No   Sexual activity: Not Currently  Other Topics Concern   Not on file  Social History Narrative   Not on file   Social Drivers of Health   Financial Resource Strain: Low Risk  (04/21/2023)   Overall Financial Resource Strain (CARDIA)    Difficulty of Paying Living Expenses: Not very hard  Food Insecurity: No Food Insecurity (04/21/2023)   Hunger Vital Sign    Worried About Running Out of Food in the Last Year: Never true    Ran Out of Food in the Last Year: Never true  Transportation Needs: No Transportation Needs (04/21/2023)   PRAPARE - Administrator, Civil Service (Medical): No  Lack of Transportation (Non-Medical): No  Physical Activity: Inactive (04/21/2023)   Exercise Vital Sign    Days of Exercise per Week: 0 days    Minutes of Exercise per Session: 0 min  Stress: No Stress Concern Present (04/21/2023)   [employer] of Occupational Health - Occupational Stress Questionnaire    Feeling of Stress : Only a little  Social Connections: Socially Isolated (04/21/2023)   Social Connection and Isolation Panel [NHANES]    Frequency of Communication with Friends and Family: More than three times a week    Frequency of Social Gatherings with Friends and Family: Once a  week    Attends Religious Services: Never    Database administrator or Organizations: No    Attends Engineer, structural: Not on file    Marital Status: Divorced     Review of Systems  Constitutional:  Negative for appetite change and unexpected weight change.  HENT:  Negative for congestion and sinus pressure.   Respiratory:  Negative for cough, chest tightness and shortness of breath.   Cardiovascular:  Negative for chest pain, palpitations and leg swelling.  Gastrointestinal:  Negative for abdominal pain, diarrhea, nausea and vomiting.  Genitourinary:  Negative for difficulty urinating and dysuria.  Musculoskeletal:  Negative for joint swelling and myalgias.  Skin:  Negative for color change and rash.  Neurological:  Negative for dizziness and headaches.  Psychiatric/Behavioral:  Negative for agitation and dysphoric mood.        Objective:     BP 128/70   Pulse 84   Temp 97.9 F (36.6 C)   Resp 16   Ht 5\' 7"  (1.702 m)   Wt 225 lb 12.8 oz (102.4 kg)   SpO2 98%   BMI 35.37 kg/m  Wt Readings from Last 3 Encounters:  08/31/23 225 lb 12.8 oz (102.4 kg)  07/04/23 221 lb (100.2 kg)  06/05/23 224 lb 12.8 oz (102 kg)    Physical Exam Vitals reviewed.  Constitutional:      General: She is not in acute distress.    Appearance: Normal appearance.  HENT:     Head: Normocephalic and atraumatic.     Right Ear: External ear normal.     Left Ear: External ear normal.     Mouth/Throat:     Pharynx: No oropharyngeal exudate or posterior oropharyngeal erythema.  Eyes:     General: No scleral icterus.       Right eye: No discharge.        Left eye: No discharge.     Conjunctiva/sclera: Conjunctivae normal.  Neck:     Thyroid : No thyromegaly.  Cardiovascular:     Rate and Rhythm: Normal rate and regular rhythm.  Pulmonary:     Effort: No respiratory distress.     Breath sounds: Normal breath sounds. No wheezing.  Abdominal:     General: Bowel sounds are normal.      Palpations: Abdomen is soft.     Tenderness: There is no abdominal tenderness.  Musculoskeletal:        General: No swelling or tenderness.     Cervical back: Neck supple. No tenderness.  Lymphadenopathy:     Cervical: No cervical adenopathy.  Skin:    Findings: No erythema or rash.  Neurological:     Mental Status: She is alert.  Psychiatric:        Mood and Affect: Mood normal.        Behavior: Behavior normal.  Outpatient Encounter Medications as of 08/31/2023  Medication Sig   albuterol  (PROVENTIL ) (2.5 MG/3ML) 0.083% nebulizer solution Take 3 mLs (2.5 mg total) by nebulization every 4 (four) hours as needed for wheezing or shortness of breath.   albuterol  (VENTOLIN  HFA) 108 (90 Base) MCG/ACT inhaler INHALE 2 PUFFS INTO THE LUNGS EVERY 6 HOURS AS NEEDED FOR WHEEZING OR SHORTNESS OF BREATH   azelastine  (ASTELIN ) 0.1 % nasal spray Place 2 sprays into both nostrils 2 (two) times daily. Use in each nostril as directed   esomeprazole  (NEXIUM ) 40 MG capsule Take 1 capsule (40 mg total) by mouth daily.   estradiol  (ESTRACE ) 0.1 MG/GM vaginal cream Estrogen Cream Instruction Discard applicator Apply pea sized amount to tip of finger to urethra before bed. Wash hands well after application. Use Monday, Wednesday and Friday   fluticasone -salmeterol (ADVAIR) 250-50 MCG/ACT AEPB Inhale 1 puff into the lungs in the morning and at bedtime.   hydrochlorothiazide  (HYDRODIURIL ) 25 MG tablet TAKE 1/2 TABLET BY MOUTH EVERY DAY   losartan  (COZAAR ) 25 MG tablet TAKE 1 TABLET(25 MG) BY MOUTH DAILY   rosuvastatin  (CRESTOR ) 10 MG tablet Take 1 tablet (10 mg total) by mouth daily.   sertraline  (ZOLOFT ) 25 MG tablet Take 1 1/2 tablet per day.   [DISCONTINUED] esomeprazole  (NEXIUM ) 40 MG capsule TAKE 1 CAPSULE BY MOUTH DAILY AT 12 NOON   [DISCONTINUED] losartan  (COZAAR ) 25 MG tablet TAKE 1 TABLET(25 MG) BY MOUTH DAILY   [DISCONTINUED] rosuvastatin  (CRESTOR ) 10 MG tablet TAKE 1 TABLET(10 MG) BY  MOUTH DAILY   [DISCONTINUED] sertraline  (ZOLOFT ) 25 MG tablet TAKE 1 TABLET BY MOUTH ONCE DAILY   No facility-administered encounter medications on file as of 08/31/2023.     Lab Results  Component Value Date   WBC 8.1 03/21/2023   HGB 12.1 03/21/2023   HCT 36.7 03/21/2023   PLT 253 03/21/2023   GLUCOSE 107 (H) 08/28/2023   CHOL 114 08/28/2023   TRIG 85.0 08/28/2023   HDL 45.60 08/28/2023   LDLCALC 51 08/28/2023   ALT 13 08/28/2023   AST 18 08/28/2023   NA 142 08/28/2023   K 4.3 08/28/2023   CL 105 08/28/2023   CREATININE 1.10 08/28/2023   BUN 16 08/28/2023   CO2 30 08/28/2023   TSH 1.72 04/23/2023   HGBA1C 6.4 08/28/2023    DG Chest 2 View Result Date: 04/13/2023 CLINICAL DATA:  Post viral cough for 2 weeks. EXAM: CHEST - 2 VIEW COMPARISON:  Radiographs 05/16/2021 and 06/09/2020. FINDINGS: The heart size and mediastinal contours are normal. The lungs are clear. There is no pleural effusion or pneumothorax. No acute osseous findings are identified. Stable degenerative changes in the spine with a focal osteophyte projecting over the midthoracic spine on the lateral view. IMPRESSION: No evidence of acute cardiopulmonary process. Electronically Signed   By: Elmon Hagedorn M.D.   On: 04/13/2023 12:06       Assessment & Plan:  Reactive airways dysfunction syndrome (HCC) Assessment & Plan: Seeing pulmonary - f/u mild reactive airways disease.  Recommended (per note) - inhaler changed to Breyna . Breathing stable.    Hypercholesteremia Assessment & Plan: Continue crestor .  Low cholesterol diet and exercise.  Follow lipid panel and liver function tests. No changes in medication today.  Lab Results  Component Value Date   CHOL 114 08/28/2023   HDL 45.60 08/28/2023   LDLCALC 51 08/28/2023   TRIG 85.0 08/28/2023   CHOLHDL 3 08/28/2023     Orders: -     Lipid  panel; Future -     Hepatic function panel; Future -     Basic metabolic panel with GFR;  Future  Hyperglycemia Assessment & Plan: Low carb diet and exercise. Follow met b and A1c.  Lab Results  Component Value Date   HGBA1C 6.4 08/28/2023     Orders: -     Hemoglobin A1c; Future  Major depressive disorder, single episode, mild (HCC) Assessment & Plan: Increased stress/anxiety as outlined. On zoloft  - low dose - 25mg  q day. Increase to 1 1/2 tablet q day. Follow.    Gastroesophageal reflux disease with esophagitis without hemorrhage Assessment & Plan: Controlled on nexium .    Iron  deficiency anemia, unspecified iron  deficiency anemia type Assessment & Plan:  Saw Dr Adrian Alba - 09/05/22 - anemia.  Suspect IDA. Colonoscopy and EGD in October 2023 did not reveal any significant pathology. Patient could not tolerate video endoscopy. S/p IV venofer .  Last check - hgb and iron  studies wnl. Recommended f/u. Follow cbc and iron  studies.   Orders: -     CBC with Differential/Platelet; Future -     IBC + Ferritin; Future  Essential hypertension, benign Assessment & Plan: Continues on losartan  and hydrochlorothiazide . Continue current medication regimen. No changes in medication. Follow pressures. Follow metabolic panel. GFR 48-49. Follow.    Aortic atherosclerosis (HCC) Assessment & Plan: Continue crestor .    Encounter for screening mammogram for malignant neoplasm of breast Assessment & Plan: Discussed mammogram. She declines.    Other orders -     Esomeprazole  Magnesium ; Take 1 capsule (40 mg total) by mouth daily.  Dispense: 90 capsule; Refill: 3 -     Losartan  Potassium; TAKE 1 TABLET(25 MG) BY MOUTH DAILY  Dispense: 90 tablet; Refill: 2 -     Rosuvastatin  Calcium ; Take 1 tablet (10 mg total) by mouth daily.  Dispense: 90 tablet; Refill: 2 -     Sertraline  HCl; Take 1 1/2 tablet per day.  Dispense: 135 tablet; Refill: 1     Dellar Fenton, MD

## 2023-09-02 ENCOUNTER — Encounter: Payer: Self-pay | Admitting: Internal Medicine

## 2023-09-02 DIAGNOSIS — Z1239 Encounter for other screening for malignant neoplasm of breast: Secondary | ICD-10-CM | POA: Insufficient documentation

## 2023-09-02 NOTE — Assessment & Plan Note (Signed)
 Low carb diet and exercise.  Follow met b and A1c.  Lab Results  Component Value Date   HGBA1C 6.4 08/28/2023

## 2023-09-02 NOTE — Assessment & Plan Note (Signed)
 Saw Dr Adrian Alba - 09/05/22 - anemia.  Suspect IDA. Colonoscopy and EGD in October 2023 did not reveal any significant pathology. Patient could not tolerate video endoscopy. S/p IV venofer .  Last check - hgb and iron  studies wnl. Recommended f/u. Follow cbc and iron  studies.

## 2023-09-02 NOTE — Assessment & Plan Note (Signed)
 Continue crestor .  Low cholesterol diet and exercise.  Follow lipid panel and liver function tests. No changes in medication today.  Lab Results  Component Value Date   CHOL 114 08/28/2023   HDL 45.60 08/28/2023   LDLCALC 51 08/28/2023   TRIG 85.0 08/28/2023   CHOLHDL 3 08/28/2023

## 2023-09-02 NOTE — Assessment & Plan Note (Signed)
 Discussed mammogram. She declines.

## 2023-09-02 NOTE — Assessment & Plan Note (Signed)
 Continue crestor

## 2023-09-02 NOTE — Assessment & Plan Note (Signed)
 Seeing pulmonary - f/u mild reactive airways disease.  Recommended (per note) - inhaler changed to Breyna . Breathing stable.

## 2023-09-02 NOTE — Assessment & Plan Note (Signed)
 Increased stress/anxiety as outlined. On zoloft  - low dose - 25mg  q day. Increase to 1 1/2 tablet q day. Follow.

## 2023-09-02 NOTE — Assessment & Plan Note (Signed)
Controlled on nexium.  

## 2023-09-02 NOTE — Assessment & Plan Note (Signed)
 Continues on losartan  and hydrochlorothiazide . Continue current medication regimen. No changes in medication. Follow pressures. Follow metabolic panel. GFR 48-49. Follow.

## 2023-10-03 ENCOUNTER — Encounter: Payer: Self-pay | Admitting: Pulmonary Disease

## 2023-10-03 ENCOUNTER — Ambulatory Visit: Payer: PPO | Admitting: Pulmonary Disease

## 2023-10-03 VITALS — BP 126/70 | HR 87 | Temp 98.1°F | Ht 67.0 in | Wt 226.6 lb

## 2023-10-03 DIAGNOSIS — J45991 Cough variant asthma: Secondary | ICD-10-CM

## 2023-10-03 DIAGNOSIS — J3089 Other allergic rhinitis: Secondary | ICD-10-CM

## 2023-10-03 DIAGNOSIS — R053 Chronic cough: Secondary | ICD-10-CM | POA: Diagnosis not present

## 2023-10-03 DIAGNOSIS — K219 Gastro-esophageal reflux disease without esophagitis: Secondary | ICD-10-CM | POA: Diagnosis not present

## 2023-10-03 LAB — NITRIC OXIDE: Nitric Oxide: 24

## 2023-10-03 NOTE — Progress Notes (Signed)
 Subjective:    [name], female    DOB: [birth year], 78 y.o.   MRN: [id number]  Patient Care Team: Dellar Fenton, MD as PCP - General (Internal Medicine) Constancia Delton, MD as PCP - Cardiology (Cardiology) Daron Ellen, [facility] as Pharmacist (Pharmacist) Roetta Clarke, NP as Nurse Practitioner (Nurse Practitioner) Marc Senior, MD as Consulting Physician (Pulmonary Disease)  Chief Complaint  Patient presents with   Follow-up    Cough, shortness of breath on exertion. Occasional wheezing.     BACKGROUND/INTERVAL:Patient is a 78 year old lifelong never smoker who has been previously seen here for issues with cough variant asthma. I last saw the patient in 04 July 2023, currently maintained on Advair 250/51 inhalation twice a day.Aaron Aas   HPI Discussed the use of AI scribe software for clinical note transcription with the patient, who gave verbal consent to proceed.  History of Present Illness   Susan Miller is a 78 year old female with cough variant asthma who presents for a follow-up on her respiratory condition.  Her cough has improved significantly with the regular use of Advair, and she is currently not experiencing any significant respiratory symptoms. However, damp weather can exacerbate her asthma symptoms.  She has been on Nexium  for gastroesophageal reflux disease (GERD) for approximately fifteen years and finds it effective. No current issues with reflux while on Nexium .  She is however, concerned about the duration on this medication and is to discuss with primary care with regards to continuing it.     DATA 05/16/2021 CXR PA and lateral: No acute cardiopulmonary disease 07/08/2021 PFTs: FEV1 2.10 L or 87% predicted, FVC 2.62 L or 82% predicted, FEV1/FVC 80%, there is a mild bronchodilator response.  Lung volumes were normal except for ERV at 12% this is indicative of obesity.  There is a significant small airways component.  Diffusion  capacity was normal.  Consistent with reactive airways disease/asthma 04/13/2023 chest x-ray PA and lateral: No active cardiopulmonary disease. 06/28/2023 PFTs: FEV1 2.07 L or 88% predicted, FVC 2.69 L or 86% predicted, FEV1/FVC 77%, lung volumes and diffusion capacity normal.  Overall normal pulmonary function testing  Review of Systems A 10 point review of systems was performed and it is as noted above otherwise negative.   Patient Active Problem List   Diagnosis Date Noted   Breast cancer screening 09/02/2023   Post-viral cough syndrome 06/28/2023   Asthmatic bronchitis 04/29/2023   Rash 12/23/2022   Reactive airways dysfunction syndrome (HCC) 12/23/2022   Foot pain 12/23/2022   Allergic rhinitis 11/14/2022   Mild reactive airways disease 10/11/2022   Polyp of ascending colon    Knee pain, right 12/13/2021   Major depressive disorder, single episode, mild (HCC) 12/11/2021   Iron  deficiency anemia, unspecified 04/10/2021   Aortic atherosclerosis (HCC) 04/10/2021   UTI (urinary tract infection) 03/27/2021   Cutaneous skin tags 12/12/2020   B12 deficiency 12/12/2020   OSA (obstructive sleep apnea) 04/18/2020   Numbness and tingling of both feet 02/20/2020   Daytime hypersomnolence 01/29/2020   Dysuria 12/07/2019   Snoring 12/07/2019   Hypercholesteremia 10/09/2019   Back pain 11/15/2018   SOB (shortness of breath) 11/15/2018   Decreased GFR 11/14/2018   Hyperbilirubinemia 11/14/2018   Abdominal bloating 12/09/2017   Osteopenia 04/29/2017   Hyperglycemia 04/27/2017   Reflux esophagitis    Heartburn    Special screening for malignant neoplasms, colon    Hair loss 10/26/2015   Fatigue 10/21/2015   Vitamin D   deficiency 03/15/2015   Weight loss counseling, encounter for 02/22/2015   Facial erythema 07/27/2014   Health care maintenance 07/05/2014   BP (high blood pressure) 03/26/2014   Headache 03/08/2014   Eyelid lesion 03/08/2014   Light headedness 06/22/2013   Cough  05/18/2013   GERD (gastroesophageal reflux disease) 05/18/2013   Swelling of extremity 03/01/2013   Left knee pain 03/01/2013   Essential hypertension, benign 12/15/2012   Frequent UTI 12/15/2012   Mild depression 12/15/2012   Environmental allergies 12/15/2012    Social History   Tobacco Use   Smoking status: Never   Smokeless tobacco: Never  Substance Use Topics   Alcohol use: No    Alcohol/week: 0.0 standard drinks of alcohol    Allergies  Allergen Reactions   Codeine Hives   Iodinated Contrast Media Swelling    Current Meds  Medication Sig   albuterol  (PROVENTIL ) (2.5 MG/3ML) 0.083% nebulizer solution Take 3 mLs (2.5 mg total) by nebulization every 4 (four) hours as needed for wheezing or shortness of breath.   albuterol  (VENTOLIN  HFA) 108 (90 Base) MCG/ACT inhaler INHALE 2 PUFFS INTO THE LUNGS EVERY 6 HOURS AS NEEDED FOR WHEEZING OR SHORTNESS OF BREATH   azelastine  (ASTELIN ) 0.1 % nasal spray Place 2 sprays into both nostrils 2 (two) times daily. Use in each nostril as directed   esomeprazole  (NEXIUM ) 40 MG capsule Take 1 capsule (40 mg total) by mouth daily.   estradiol  (ESTRACE ) 0.1 MG/GM vaginal cream Estrogen Cream Instruction Discard applicator Apply pea sized amount to tip of finger to urethra before bed. Wash hands well after application. Use Monday, Wednesday and Friday   fluticasone -salmeterol (ADVAIR) 250-50 MCG/ACT AEPB Inhale 1 puff into the lungs in the morning and at bedtime.   hydrochlorothiazide  (HYDRODIURIL ) 25 MG tablet TAKE 1/2 TABLET BY MOUTH EVERY DAY   losartan  (COZAAR ) 25 MG tablet TAKE 1 TABLET(25 MG) BY MOUTH DAILY   rosuvastatin  (CRESTOR ) 10 MG tablet Take 1 tablet (10 mg total) by mouth daily.   sertraline  (ZOLOFT ) 25 MG tablet Take 1 1/2 tablet per day.    Immunization History  Administered Date(s) Administered   Fluad Quad(high Dose 65+) 01/31/2019, 01/29/2020, 01/25/2022   Influenza, High Dose Seasonal PF 01/11/2016, 04/27/2017, 01/18/2018    Influenza,inj,Quad PF,6+ Mos 02/24/2013, 02/27/2014, 02/22/2015   PFIZER(Purple Top)SARS-COV-2 Vaccination 06/23/2019, 07/14/2019, 01/06/2022   Pneumococcal Conjugate-13 11/06/2016   Pneumococcal Polysaccharide-23 12/06/2017   RSV,unspecified 01/06/2022        Objective:     BP 126/70 (BP Location: Left Arm, Patient Position: Sitting, Cuff Size: Normal)   Pulse 87   Temp 98.1 F (36.7 C) (Oral)   Ht 5\' 7"  (1.702 m)   Wt 226 lb 9.6 oz (102.8 kg)   SpO2 97%   BMI 35.49 kg/m   SpO2: 97 %  GENERAL: Well-developed, obese woman, no acute distress, fully ambulatory, no conversational dyspnea. HEAD: Normocephalic, atraumatic.  EYES: Pupils equal, round, reactive to light.  No scleral icterus.  MOUTH: Few chipped teeth, multiple fillings, otherwise dentition intact, oral mucosa moist. NECK: Supple. No thyromegaly. Trachea midline. No JVD.  No adenopathy. PULMONARY: Good air entry bilaterally.  No adventitious sounds. CARDIOVASCULAR: S1 and S2. Regular rate and rhythm.  ABDOMEN: Benign. MUSCULOSKELETAL: No joint deformity, no clubbing, no edema.  NEUROLOGIC: No overt focal deficit, no gait disturbance, speech is fluent. SKIN: Intact,warm,dry. PSYCH: Mood and behavior normal.   Lab Results  Component Value Date   NITRICOXIDE 24 10/03/2023  *No evidence of type II inflammation.  Assessment & Plan:     ICD-10-CM   1. Cough variant asthma  J45.991 Nitric oxide     2. Non-seasonal allergic rhinitis, unspecified trigger  J30.89     3. Chronic cough  R05.3     4. Gastroesophageal reflux disease without esophagitis  K21.9      Orders Placed This Encounter  Procedures   Nitric oxide    Discussion:    Asthma Asthma is well-controlled with current treatment. She reports significant improvement in cough. Airway inflammation is well managed as confirmed by nitric oxide  testing. Weather changes, particularly dampness, can affect symptoms, but she is currently stable. -  Continue Advair as prescribed - Perform nitric oxide  test to assess airway inflammation - Schedule follow-up in six months unless symptoms worsen  Gastroesophageal reflux disease (GERD) GERD is effectively managed with Nexium . She expresses interest in transitioning to a more natural treatment due to concerns about long-term side effects. Discussed potential transition to Pepcid at bedtime and dietary modifications to manage symptoms. - Discuss transition to Pepcid with primary care provider, Dr. Geralyn Knee - Implement dietary modifications, including avoiding eating for 2-3 hours before bedtime      Advised if symptoms do not improve or worsen, to please contact office for sooner follow up or seek emergency care.    I spent 35 minutes of dedicated to the care of this patient on the date of this encounter to include pre-visit review of records, face-to-face time with the patient discussing conditions above, post visit ordering of testing, clinical documentation with the electronic health record, making appropriate referrals as documented, and communicating necessary findings to members of the patients care team.     C. Chloe Counter, MD Advanced Bronchoscopy P[facility]    *This note was generated using voice recognition software/Dragon and/or AI transcription program.  Despite best efforts to proofread, errors can occur which can change the meaning. Any transcriptional errors that result from this process are unintentional and may not be fully corrected at the time of dictation.

## 2023-10-03 NOTE — Patient Instructions (Signed)
 VISIT SUMMARY:  Today, you had a follow-up appointment to check on your asthma and gastroesophageal reflux disease (GERD). Your asthma symptoms have improved significantly with your current medication, and your GERD is well-managed with Nexium . We discussed potential changes to your GERD treatment and reviewed your current asthma management plan.  YOUR PLAN:  -ASTHMA: Asthma is a condition where your airways become inflamed and narrow, making it hard to breathe. Your asthma is well-controlled with Advair, and your cough has improved. Continue using Advair as prescribed. We will perform a nitric oxide  test to check your airway inflammation and schedule a follow-up in six months unless your symptoms worsen.  -GASTROESOPHAGEAL REFLUX DISEASE (GERD): GERD is a condition where stomach acid frequently flows back into the tube connecting your mouth and stomach, causing discomfort. Your GERD is well-managed with Nexium , but you are interested in transitioning to a more natural treatment. We discussed switching to Pepcid at bedtime and making dietary changes, such as avoiding eating 2-3 hours before bedtime. Please discuss this transition with your primary care provider, Dr. Geralyn Knee.  INSTRUCTIONS:  Please continue using Advair as prescribed for your asthma and schedule a follow-up appointment in six months unless your symptoms worsen. For your GERD, discuss the potential transition to Pepcid with your primary care provider, Dr. Geralyn Knee, and implement the suggested dietary modifications.

## 2023-10-05 ENCOUNTER — Telehealth: Payer: Self-pay | Admitting: Internal Medicine

## 2023-10-05 NOTE — Telephone Encounter (Signed)
 Lm and a MyChart note sent.Dr Geralyn Knee will not be in the office on June 20th. Please call and reschedule your appointment. E2C2 please reschedule appointment.

## 2023-10-12 DIAGNOSIS — H2513 Age-related nuclear cataract, bilateral: Secondary | ICD-10-CM | POA: Diagnosis not present

## 2023-10-12 DIAGNOSIS — H04123 Dry eye syndrome of bilateral lacrimal glands: Secondary | ICD-10-CM | POA: Diagnosis not present

## 2023-10-20 ENCOUNTER — Emergency Department

## 2023-10-20 ENCOUNTER — Inpatient Hospital Stay
Admission: EM | Admit: 2023-10-20 | Discharge: 2023-10-24 | DRG: 390 | Disposition: A | Discharge status: Home / Self Care | Attending: Student in an Organized Health Care Education/Training Program | Admitting: Student in an Organized Health Care Education/Training Program

## 2023-10-20 ENCOUNTER — Other Ambulatory Visit: Payer: Self-pay

## 2023-10-20 ENCOUNTER — Encounter: Payer: Self-pay | Admitting: Internal Medicine

## 2023-10-20 DIAGNOSIS — G4733 Obstructive sleep apnea (adult) (pediatric): Secondary | ICD-10-CM | POA: Diagnosis present

## 2023-10-20 DIAGNOSIS — J45909 Unspecified asthma, uncomplicated: Secondary | ICD-10-CM | POA: Diagnosis not present

## 2023-10-20 DIAGNOSIS — Z885 Allergy status to narcotic agent status: Secondary | ICD-10-CM | POA: Diagnosis not present

## 2023-10-20 DIAGNOSIS — R110 Nausea: Secondary | ICD-10-CM | POA: Diagnosis not present

## 2023-10-20 DIAGNOSIS — R000 Tachycardia, unspecified: Secondary | ICD-10-CM | POA: Diagnosis not present

## 2023-10-20 DIAGNOSIS — K449 Diaphragmatic hernia without obstruction or gangrene: Secondary | ICD-10-CM | POA: Diagnosis not present

## 2023-10-20 DIAGNOSIS — Z803 Family history of malignant neoplasm of breast: Secondary | ICD-10-CM

## 2023-10-20 DIAGNOSIS — E785 Hyperlipidemia, unspecified: Secondary | ICD-10-CM | POA: Diagnosis present

## 2023-10-20 DIAGNOSIS — F419 Anxiety disorder, unspecified: Secondary | ICD-10-CM | POA: Diagnosis not present

## 2023-10-20 DIAGNOSIS — R1111 Vomiting without nausea: Secondary | ICD-10-CM | POA: Diagnosis not present

## 2023-10-20 DIAGNOSIS — K56609 Unspecified intestinal obstruction, unspecified as to partial versus complete obstruction: Secondary | ICD-10-CM | POA: Diagnosis not present

## 2023-10-20 DIAGNOSIS — Z79899 Other long term (current) drug therapy: Secondary | ICD-10-CM

## 2023-10-20 DIAGNOSIS — R109 Unspecified abdominal pain: Secondary | ICD-10-CM | POA: Diagnosis present

## 2023-10-20 DIAGNOSIS — Z800 Family history of malignant neoplasm of digestive organs: Secondary | ICD-10-CM

## 2023-10-20 DIAGNOSIS — K8020 Calculus of gallbladder without cholecystitis without obstruction: Secondary | ICD-10-CM | POA: Diagnosis not present

## 2023-10-20 DIAGNOSIS — Z823 Family history of stroke: Secondary | ICD-10-CM | POA: Diagnosis not present

## 2023-10-20 DIAGNOSIS — K219 Gastro-esophageal reflux disease without esophagitis: Secondary | ICD-10-CM | POA: Diagnosis present

## 2023-10-20 DIAGNOSIS — Z91041 Radiographic dye allergy status: Secondary | ICD-10-CM | POA: Diagnosis not present

## 2023-10-20 DIAGNOSIS — R188 Other ascites: Secondary | ICD-10-CM | POA: Diagnosis not present

## 2023-10-20 DIAGNOSIS — F32A Depression, unspecified: Secondary | ICD-10-CM | POA: Diagnosis present

## 2023-10-20 DIAGNOSIS — I10 Essential (primary) hypertension: Secondary | ICD-10-CM | POA: Diagnosis not present

## 2023-10-20 DIAGNOSIS — Z4682 Encounter for fitting and adjustment of non-vascular catheter: Secondary | ICD-10-CM | POA: Diagnosis not present

## 2023-10-20 DIAGNOSIS — Z8249 Family history of ischemic heart disease and other diseases of the circulatory system: Secondary | ICD-10-CM

## 2023-10-20 DIAGNOSIS — Z833 Family history of diabetes mellitus: Secondary | ICD-10-CM | POA: Diagnosis not present

## 2023-10-20 DIAGNOSIS — R079 Chest pain, unspecified: Secondary | ICD-10-CM | POA: Diagnosis not present

## 2023-10-20 DIAGNOSIS — Z87440 Personal history of urinary (tract) infections: Secondary | ICD-10-CM

## 2023-10-20 DIAGNOSIS — Z7951 Long term (current) use of inhaled steroids: Secondary | ICD-10-CM

## 2023-10-20 DIAGNOSIS — N281 Cyst of kidney, acquired: Secondary | ICD-10-CM | POA: Diagnosis not present

## 2023-10-20 DIAGNOSIS — K6389 Other specified diseases of intestine: Secondary | ICD-10-CM | POA: Diagnosis not present

## 2023-10-20 LAB — CBC
HCT: 41.6 % (ref 36.0–46.0)
Hemoglobin: 13.9 g/dL (ref 12.0–15.0)
MCH: 30.1 pg (ref 26.0–34.0)
MCHC: 33.4 g/dL (ref 30.0–36.0)
MCV: 90 fL (ref 80.0–100.0)
Platelets: 296 10*3/uL (ref 150–400)
RBC: 4.62 MIL/uL (ref 3.87–5.11)
RDW: 13.3 % (ref 11.5–15.5)
WBC: 14.2 10*3/uL — ABNORMAL HIGH (ref 4.0–10.5)
nRBC: 0 % (ref 0.0–0.2)

## 2023-10-20 LAB — URINALYSIS, ROUTINE W REFLEX MICROSCOPIC
Bilirubin Urine: NEGATIVE
Glucose, UA: NEGATIVE mg/dL
Hgb urine dipstick: NEGATIVE
Ketones, ur: NEGATIVE mg/dL
Nitrite: NEGATIVE
Protein, ur: 30 mg/dL — AB
Specific Gravity, Urine: 1.016 (ref 1.005–1.030)
pH: 7 (ref 5.0–8.0)

## 2023-10-20 LAB — COMPREHENSIVE METABOLIC PANEL WITH GFR
ALT: 18 U/L (ref 0–44)
AST: 25 U/L (ref 15–41)
Albumin: 4.4 g/dL (ref 3.5–5.0)
Alkaline Phosphatase: 71 U/L (ref 38–126)
Anion gap: 10 (ref 5–15)
BUN: 17 mg/dL (ref 8–23)
CO2: 27 mmol/L (ref 22–32)
Calcium: 10.4 mg/dL — ABNORMAL HIGH (ref 8.9–10.3)
Chloride: 102 mmol/L (ref 98–111)
Creatinine, Ser: 1 mg/dL (ref 0.44–1.00)
GFR, Estimated: 58 mL/min — ABNORMAL LOW (ref >60)
Glucose, Bld: 180 mg/dL — ABNORMAL HIGH (ref 70–99)
Potassium: 4.5 mmol/L (ref 3.5–5.1)
Sodium: 139 mmol/L (ref 135–145)
Total Bilirubin: 0.8 mg/dL (ref 0.0–1.2)
Total Protein: 7.7 g/dL (ref 6.5–8.1)

## 2023-10-20 LAB — LIPASE, BLOOD: Lipase: 32 U/L (ref 11–51)

## 2023-10-20 MED ORDER — FENTANYL CITRATE PF 50 MCG/ML IJ SOSY
100.0000 ug | PREFILLED_SYRINGE | Freq: Once | INTRAMUSCULAR | Status: AC
  Administered 2023-10-20: 100 ug via INTRAVENOUS
  Filled 2023-10-20: qty 2

## 2023-10-20 MED ORDER — FLUTICASONE FUROATE-VILANTEROL 200-25 MCG/ACT IN AEPB
1.0000 | INHALATION_SPRAY | Freq: Every day | RESPIRATORY_TRACT | Status: DC
  Administered 2023-10-20 – 2023-10-24 (×5): 1 via RESPIRATORY_TRACT
  Filled 2023-10-20: qty 28

## 2023-10-20 MED ORDER — ONDANSETRON HCL 4 MG PO TABS: 4.0000 mg | ORAL_TABLET | Freq: Four times a day (QID) | ORAL | Status: DC | PRN

## 2023-10-20 MED ORDER — ONDANSETRON HCL 4 MG/2ML IJ SOLN
4.0000 mg | Freq: Once | INTRAMUSCULAR | Status: AC
  Administered 2023-10-20: 4 mg via INTRAVENOUS
  Filled 2023-10-20: qty 2

## 2023-10-20 MED ORDER — SODIUM CHLORIDE 0.9 % IV SOLN: INTRAVENOUS | Status: DC

## 2023-10-20 MED ORDER — PANTOPRAZOLE SODIUM 40 MG IV SOLR
40.0000 mg | INTRAVENOUS | Status: DC
  Administered 2023-10-20 – 2023-10-22 (×3): 40 mg via INTRAVENOUS
  Filled 2023-10-20 (×3): qty 10

## 2023-10-20 MED ORDER — ONDANSETRON HCL 4 MG/2ML IJ SOLN
4.0000 mg | Freq: Four times a day (QID) | INTRAMUSCULAR | Status: DC | PRN
  Administered 2023-10-20 (×2): 4 mg via INTRAVENOUS
  Filled 2023-10-20 (×2): qty 2

## 2023-10-20 MED ORDER — FENTANYL CITRATE PF 50 MCG/ML IJ SOSY: 50.0000 ug | PREFILLED_SYRINGE | INTRAMUSCULAR | Status: DC | PRN

## 2023-10-20 MED ORDER — LORAZEPAM 2 MG/ML IJ SOLN
0.5000 mg | INTRAMUSCULAR | Status: DC | PRN
  Administered 2023-10-20 – 2023-10-22 (×2): 0.5 mg via INTRAVENOUS
  Filled 2023-10-20 (×2): qty 1

## 2023-10-20 MED ORDER — KETOROLAC TROMETHAMINE 15 MG/ML IJ SOLN
15.0000 mg | Freq: Four times a day (QID) | INTRAMUSCULAR | Status: DC | PRN
  Administered 2023-10-20 – 2023-10-22 (×3): 15 mg via INTRAVENOUS
  Filled 2023-10-20 (×3): qty 1

## 2023-10-20 MED ORDER — FENTANYL CITRATE PF 50 MCG/ML IJ SOSY
50.0000 ug | PREFILLED_SYRINGE | Freq: Once | INTRAMUSCULAR | Status: AC
  Administered 2023-10-20: 50 ug via INTRAVENOUS
  Filled 2023-10-20: qty 1

## 2023-10-20 MED ORDER — ENOXAPARIN SODIUM 40 MG/0.4ML IJ SOSY: 40.0000 mg | PREFILLED_SYRINGE | INTRAMUSCULAR | Status: DC

## 2023-10-20 MED ORDER — ALBUTEROL SULFATE (2.5 MG/3ML) 0.083% IN NEBU: 2.5000 mg | INHALATION_SOLUTION | RESPIRATORY_TRACT | Status: DC | PRN

## 2023-10-20 MED ORDER — HYDRALAZINE HCL 20 MG/ML IJ SOLN: 10.0000 mg | INTRAMUSCULAR | Status: DC | PRN

## 2023-10-20 NOTE — ED Notes (Signed)
 Fall precautions in place for Pt. This RN placed fall band, fall grip socks, bed alarm and fall sign.

## 2023-10-20 NOTE — Plan of Care (Signed)

## 2023-10-20 NOTE — Progress Notes (Signed)
 Patient arrived to room. Alert and oriented x4. VSS but BP high. Patient can ambulate with assistance to bathroom. Low fall risk. NG tube in place.

## 2023-10-20 NOTE — Consult Note (Signed)
 [name], female   DOB: [birth year], 78 y.o.   MRN: [id number]  HPI Susan Miller is a 78 y.o. female seen in consultation at the request of Dr. Johnetta Nab.  She presented with an acute onset of abdominal pain located in the lower abdomen pain was sharp severe and woke her from sleep earlier this morning.  She also had associated nausea and vomiting.  No fevers no chills.  No specific alleviating or aggravating factors.  Of note the patient had no surgical history no abdominal operations. She did have appropriate workup including a CT scan of the abdomen pelvis that I personally reviewed she does have a good sized hiatal hernia and dilated loops of small bowel consistent with small bowel obstruction.  Transition area seems to be in the right lower quadrant.  No masses no internal hernias no pneumatosis no free air Labs  includes a white count of 14,000 rest of the CBC is normal.  CMP is normal HPI  Past Medical History:  Diagnosis Date   Allergy    Depression    History of chicken pox    HLD (hyperlipidemia)    Hx: UTI (urinary tract infection)    Hypertension    Sleep apnea     Past Surgical History:  Procedure Laterality Date   COLONOSCOPY WITH PROPOFOL  N/A 06/20/2016   Procedure: COLONOSCOPY WITH PROPOFOL ;  Surgeon: Marnee Sink, MD;  Location: [facility] ENDOSCOPY;  Service: Endoscopy;  Laterality: N/A;   COLONOSCOPY WITH PROPOFOL  N/A 02/14/2022   Procedure: COLONOSCOPY WITH PROPOFOL ;  Surgeon: Marnee Sink, MD;  Location: [facility] ENDOSCOPY;  Service: Endoscopy;  Laterality: N/A;   ESOPHAGOGASTRODUODENOSCOPY N/A 02/14/2022   Procedure: ESOPHAGOGASTRODUODENOSCOPY (EGD);  Surgeon: Marnee Sink, MD;  Location: [facility] ENDOSCOPY;  Service: Endoscopy;  Laterality: N/A;   ESOPHAGOGASTRODUODENOSCOPY (EGD) WITH PROPOFOL  N/A 06/20/2016   Procedure: ESOPHAGOGASTRODUODENOSCOPY (EGD) WITH PROPOFOL ;  Surgeon: Marnee Sink, MD;  Location: [facility] ENDOSCOPY;  Service: Endoscopy;  Laterality: N/A;    TONSILLECTOMY     as a child   TUMOR REMOVAL     behind ear    Family History  Problem Relation Age of Onset   Hypertension Mother    Cancer Sister        colon cancer   Hypertension Sister    Diabetes Sister    Hypertension Brother    Hypertension Maternal Aunt    Breast cancer Maternal Aunt 60   Hypertension Maternal Uncle    Stroke Maternal Grandmother    Hypertension Maternal Grandmother     Social History Social History   Tobacco Use   Smoking status: Never   Smokeless tobacco: Never  Vaping Use   Vaping status: Never Used  Substance Use Topics   Alcohol use: No    Alcohol/week: 0.0 standard drinks of alcohol   Drug use: No    Allergies  Allergen Reactions   Codeine Hives   Iodinated Contrast Media Swelling    Current Facility-Administered Medications  Medication Dose Route Frequency Provider Last Rate Last Admin   fentaNYL (SUBLIMAZE) injection 50-100 mcg  50-100 mcg Intravenous Q2H PRN Darus Engels A, DO       ketorolac (TORADOL) 15 MG/ML injection 15 mg  15 mg Intravenous Q6H PRN Darus Engels A, DO       ondansetron (ZOFRAN) tablet 4 mg  4 mg Oral Q6H PRN Darus Engels A, DO       Or   ondansetron (ZOFRAN) injection 4 mg  4 mg Intravenous Q6H PRN Antoniette Batty,  Kelly A, DO         Review of Systems Full ROS  was asked and was negative except for the information on the HPI  Physical Exam Blood pressure (!) 174/76, pulse 93, temperature 98.1 F (36.7 C), resp. rate 18, weight 102.7 kg, SpO2 96%. CONSTITUTIONAL: NAD. EYES: Pupils are equal, round,, Sclera are non-icteric. EARS, NOSE, MOUTH AND THROAT: The oropharynx is clear. The oral mucosa is pink and moist. Hearing is intact to voice. LYMPH NODES:  Lymph nodes in the neck are normal. RESPIRATORY:  Lungs are clear. There is normal respiratory effort, with equal breath sounds bilaterally, and without pathologic use of accessory muscles. CARDIOVASCULAR: Heart is regular without murmurs, gallops,  or rubs. GI: The abdomen is  soft mildly distended and mild tenderness diffusely w/o rebound or peritonitis. d. There are no palpable masses. There is no hepatosplenomegaly. There are normal bowel sounds  GU: Rectal deferred.   MUSCULOSKELETAL: Normal muscle strength and tone. No cyanosis or edema.   SKIN: Turgor is good and there are no pathologic skin lesions or ulcers. NEUROLOGIC: Motor and sensation is grossly normal. Cranial nerves are grossly intact. PSYCH:  Oriented to person, place and time. Affect is normal.  Data Reviewed  I have personally reviewed the patient's imaging, laboratory findings and medical records.    Assessment/Plan 78 year old female with small bowel obstruction and a virgin abdomen.  We will try conservative measures with an NG tube and serial abdominal exams.  At this time she is not toxic nor peritonitic.  I do think is worth a trial of nonoperative measurements.  She understands that if she were to fail we may have to do either a diagnostic robotic laparoscopy versus a laparotomy.  Given that there are no prior abdominal operations I will have a lower threshold to proceed with surgical intervention.  With that being said I think that we still need to give her a chance.  Extensive counseling provided.  We will continue to follow her   Evelia Hipp, MD FACS General Surgeon 10/20/2023, 1:08 PM

## 2023-10-20 NOTE — H&P (Addendum)
 History and Physical    Patient: Susan Miller GNF:[id number] DOB: [birth year] DOA: 10/20/2023 DOS: the patient was seen and examined on 10/20/2023 PCP: Dellar Fenton, MD  Patient coming from: Home  Chief Complaint:  Chief Complaint  Patient presents with   Abdominal Pain   HPI: Susan Miller is a 78 y.o. female with medical history significant of HTN, OSA, asthma, GERD who presented to the ED this morning for evaluation of abdominal pain that started yesterday.  She reports having been constipated, used a suppository yesterday with some stool.  Today she developed progressive lower abdominal pain.  She reports associated nausea and vomiting.  Denies prior abdominal surgery, no prior episodes of bowel obstruction.  Describes pain at times sharp and stabbing.  She received fentanyl in the ED with pain better controlled.  Reports discomfort from NG tube.  ED course -  Initial vitals -  temp 97.5 F, HR 105, RR 18, BP 167/87, spO2 100% on room air Labs - CMP notable for glucose 180, Ca 10.4 CBC notable for WBC 14.2 UA rare bacteria, 0-5 WBC's, small leukocytes, negative nitrite  CT abdomen/pelvis showed distal small bowel obstruction with transition point in the right lower quadrant suspicious for adhesion, tiny ascites, moderate hiatal hernia.  General surgery was consulted in the ED.   NG tube placed to intermittent suction.  Patient is admitted for further management as outlined in detail below.  Review of Systems: As mentioned in the history of present illness. All other systems reviewed and are negative.   Past Medical History:  Diagnosis Date   Allergy    Depression    History of chicken pox    HLD (hyperlipidemia)    Hx: UTI (urinary tract infection)    Hypertension    Sleep apnea    Past Surgical History:  Procedure Laterality Date   COLONOSCOPY WITH PROPOFOL  N/A 06/20/2016   Procedure: COLONOSCOPY WITH PROPOFOL ;  Surgeon: Marnee Sink, MD;  Location: [facility] ENDOSCOPY;   Service: Endoscopy;  Laterality: N/A;   COLONOSCOPY WITH PROPOFOL  N/A 02/14/2022   Procedure: COLONOSCOPY WITH PROPOFOL ;  Surgeon: Marnee Sink, MD;  Location: [facility] ENDOSCOPY;  Service: Endoscopy;  Laterality: N/A;   ESOPHAGOGASTRODUODENOSCOPY N/A 02/14/2022   Procedure: ESOPHAGOGASTRODUODENOSCOPY (EGD);  Surgeon: Marnee Sink, MD;  Location: [facility] ENDOSCOPY;  Service: Endoscopy;  Laterality: N/A;   ESOPHAGOGASTRODUODENOSCOPY (EGD) WITH PROPOFOL  N/A 06/20/2016   Procedure: ESOPHAGOGASTRODUODENOSCOPY (EGD) WITH PROPOFOL ;  Surgeon: Marnee Sink, MD;  Location: [facility] ENDOSCOPY;  Service: Endoscopy;  Laterality: N/A;   TONSILLECTOMY     as a child   TUMOR REMOVAL     behind ear   Social History:  reports that she has never smoked. She has never used smokeless tobacco. She reports that she does not drink alcohol and does not use drugs.  Allergies  Allergen Reactions   Codeine Hives   Iodinated Contrast Media Swelling    Family History  Problem Relation Age of Onset   Hypertension Mother    Cancer Sister        colon cancer   Hypertension Sister    Diabetes Sister    Hypertension Brother    Hypertension Maternal Aunt    Breast cancer Maternal Aunt 60   Hypertension Maternal Uncle    Stroke Maternal Grandmother    Hypertension Maternal Grandmother     Prior to Admission medications   Medication Sig Start Date End Date Taking? Authorizing Provider  albuterol  (PROVENTIL ) (2.5 MG/3ML) 0.083% nebulizer solution Take 3 mLs (2.5 mg  total) by nebulization every 4 (four) hours as needed for wheezing or shortness of breath. 07/04/23 07/03/24  Marc Senior, MD  albuterol  (VENTOLIN  HFA) 108 (90 Base) MCG/ACT inhaler INHALE 2 PUFFS INTO THE LUNGS EVERY 6 HOURS AS NEEDED FOR WHEEZING OR SHORTNESS OF BREATH 11/10/22   Marc Senior, MD  azelastine  (ASTELIN ) 0.1 % nasal spray Place 2 sprays into both nostrils 2 (two) times daily. Use in each nostril as directed 11/14/22   Cobb, Mariah Shines, NP   esomeprazole  (NEXIUM ) 40 MG capsule Take 1 capsule (40 mg total) by mouth daily. 08/31/23   Dellar Fenton, MD  estradiol  (ESTRACE ) 0.1 MG/GM vaginal cream Estrogen Cream Instruction Discard applicator Apply pea sized amount to tip of finger to urethra before bed. Wash hands well after application. Use Monday, Wednesday and Friday 06/23/21   Dustin Gimenez, MD  fluticasone -salmeterol (ADVAIR) 250-50 MCG/ACT AEPB Inhale 1 puff into the lungs in the morning and at bedtime. 07/13/23   Marc Senior, MD  hydrochlorothiazide  (HYDRODIURIL ) 25 MG tablet TAKE 1/2 TABLET BY MOUTH EVERY DAY 08/15/23   Dellar Fenton, MD  losartan  (COZAAR ) 25 MG tablet TAKE 1 TABLET(25 MG) BY MOUTH DAILY 08/31/23   Dellar Fenton, MD  rosuvastatin  (CRESTOR ) 10 MG tablet Take 1 tablet (10 mg total) by mouth daily. 08/31/23   Dellar Fenton, MD  sertraline  (ZOLOFT ) 25 MG tablet Take 1 1/2 tablet per day. 08/31/23   Dellar Fenton, MD    Physical Exam: Vitals:   10/20/23 0840 10/20/23 0842 10/20/23 1000 10/20/23 1024  BP:  (!) 167/87 (!) 156/83   Pulse:  (!) 105 84   Resp:  18    Temp:  (!) 97.5 F (36.4 C)    TempSrc:  Oral    SpO2:  100%  97%  Weight: 102.7 kg      General exam: awake, alert, no acute distress HEENT: NG tube to wall suction, moist mucus membranes, hearing grossly normal  Respiratory system: CTAB, no wheezes, rales or rhonchi, normal respiratory effort. Cardiovascular system: normal S1/S2, RRR, no JVD, murmurs, rubs, gallops, no pedal edema.   Gastrointestinal system: subtle left-sided bowel sounds, no bowel sounds on right, tender without rebound or guarding Central nervous system: A&O x 3. no gross focal neurologic deficits, normal speech Extremities: moves all , no edema, normal tone Skin: dry, intact, normal temperature Psychiatry: normal mood, congruent affect, judgement and insight appear normal   Data Reviewed:  As reviewed above  Assessment and Plan:   SBO - --General surgery  following --NPO except ice chips --NG tube to intermittent suction --Monitor abdominal exam closely --Serial KUB's per surgery --IV Toradol and Fentanyl PRN  Hypertension --IV hydralazine PRN --Home PO meds  Asthma --PRN albuterol  --Breo  Anxiety  --Holding Zoloft  --PRN IV Ativan 0.5 mg  GERD --IV Protonix    Advance Care Planning: Code status - full code  Consults: General surgery - Dr. Dana Duncan  Family Communication: Son at bedside  Severity of Illness: The appropriate patient status for this patient is INPATIENT. Inpatient status is judged to be reasonable and necessary in order to provide the required intensity of service to ensure the patient's safety. The patient's presenting symptoms, physical exam findings, and initial radiographic and laboratory data in the context of their chronic comorbidities is felt to place them at high risk for further clinical deterioration. Furthermore, it is not anticipated that the patient will be medically stable for discharge from the hospital within 2 midnights of admission.   *  I certify that at the point of admission it is my clinical judgment that the patient will require inpatient hospital care spanning beyond 2 midnights from the point of admission due to high intensity of service, high risk for further deterioration and high frequency of surveillance required.*  Author: Montey Apa, DO 10/20/2023 12:14 PM  For on call review [email or web address].

## 2023-10-20 NOTE — ED Notes (Addendum)
 Pt dressed out in a gown verified no nose surgeries or traumatic nose events in past prior to NG tube insertion. Pt attempted to pull NG tube out while myself and Ladonna Pickup were coaching Pt through process, d/t anxiety. Pt let go and let us  continue to walk her through process. Process continued smoothly.

## 2023-10-20 NOTE — ED Notes (Signed)
 Advised nurse that patient has ready bed

## 2023-10-20 NOTE — ED Provider Notes (Signed)
 [facility] Provider Note    Event Date/Time   First MD Initiated Contact with Patient 10/20/23 [phone]     (approximate)   History   Abdominal Pain   HPI  Susan Miller is a 78 y.o. female with history of hyperlipidemia who comes in with concerns for abdominal pain.  Patient reports that yesterday that she felt constipated that she took a suppository and did have good output of stool.  However today she started developing some lower abdominal sharp stabbing pain associate with nausea, vomiting.  Denies any chest pain, shortness of breath, upper abdominal pain.  Denies any falls hitting her head   Physical Exam   Triage Vital Signs: ED Triage Vitals  Encounter Vitals Group     BP 10/20/23 0842 (!) 167/87     Girls Systolic BP Percentile --      Girls Diastolic BP Percentile --      Boys Systolic BP Percentile --      Boys Diastolic BP Percentile --      Pulse Rate 10/20/23 0842 (!) 105     Resp 10/20/23 0842 18     Temp 10/20/23 0842 (!) 97.5 F (36.4 C)     Temp Source 10/20/23 0842 Oral     SpO2 10/20/23 0842 100 %     Weight 10/20/23 0840 226 lb 6.6 oz (102.7 kg)     Height --      Head Circumference --      Peak Flow --      Pain Score 10/20/23 0840 7     Pain Loc --      Pain Education --      Exclude from Growth Chart --     Most recent vital signs: Vitals:   10/20/23 0842  BP: (!) 167/87  Pulse: (!) 105  Resp: 18  Temp: (!) 97.5 F (36.4 C)  SpO2: 100%     General: Awake, no distress.  CV:  Good peripheral perfusion.  Resp:  Normal effort.  Abd:  No distention.  Tender in the lower abdomen without any rebound, guarding Other:     ED Results / Procedures / Treatments   Labs (all labs ordered are listed, but only abnormal results are displayed) Labs Reviewed  COMPREHENSIVE METABOLIC PANEL WITH GFR - Abnormal; Notable for the following components:      Result Value   Glucose, Bld 180 (*)    Calcium  10.4 (*)    GFR,  Estimated 58 (*)    All other components within normal limits  CBC - Abnormal; Notable for the following components:   WBC 14.2 (*)    All other components within normal limits  LIPASE, BLOOD  URINALYSIS, ROUTINE W REFLEX MICROSCOPIC   RADIOLOGY I have reviewed the ct  personally and interpreted possible small bowel obstruction   PROCEDURES:  Critical Care performed: No  Procedures   MEDICATIONS ORDERED IN ED: Medications  fentaNYL (SUBLIMAZE) injection 100 mcg (has no administration in time range)  fentaNYL (SUBLIMAZE) injection 50 mcg (50 mcg Intravenous Given 10/20/23 0947)  ondansetron (ZOFRAN) injection 4 mg (4 mg Intravenous Given 10/20/23 0946)     IMPRESSION / MDM / ASSESSMENT AND PLAN / ED COURSE  I reviewed the triage vital signs and the nursing notes.   Patient's presentation is most consistent with acute presentation with potential threat to life or bodily function.   Patient comes in with concerns for lower abdominal pain initially felt some constipation but  given tenderness will get CT imaging to rule out obstruction, perforation or other acute pathology.  No chest pain, no shortness of breath no falls or hitting her head Patient given IV fentanyl IV Zofran help with symptoms  Urine without evidence of UTI.  CBC shows elevated white count but no other sepsis criteria.  His CMP normal creatinine.  Lactate negative  CT scan concerning for small bowel obstruction with adhesions.  Discussed with Dr. Emilio Harder who recommend admission to the hospitalist recommend NG tube placement.     FINAL CLINICAL IMPRESSION(S) / ED DIAGNOSES   Final diagnoses:  Small bowel obstruction (HCC)     Rx / DC Orders   ED Discharge Orders     None        Note:  This document was prepared using Dragon voice recognition software and may include unintentional dictation errors.   Lubertha Rush, MD 10/20/23 [id number]

## 2023-10-20 NOTE — ED Triage Notes (Signed)
 Arrives via [facility].  From home. C/O constipation x last several days. Today c/o abd pain mid lower abd pain. Also N/V this morning.  VS wnl.

## 2023-10-21 ENCOUNTER — Inpatient Hospital Stay

## 2023-10-21 DIAGNOSIS — K56609 Unspecified intestinal obstruction, unspecified as to partial versus complete obstruction: Secondary | ICD-10-CM | POA: Diagnosis not present

## 2023-10-21 LAB — CBC
HCT: 35.8 % — ABNORMAL LOW (ref 36.0–46.0)
Hemoglobin: 11.8 g/dL — ABNORMAL LOW (ref 12.0–15.0)
MCH: 30.2 pg (ref 26.0–34.0)
MCHC: 33 g/dL (ref 30.0–36.0)
MCV: 91.6 fL (ref 80.0–100.0)
Platelets: 236 10*3/uL (ref 150–400)
RBC: 3.91 MIL/uL (ref 3.87–5.11)
RDW: 13.5 % (ref 11.5–15.5)
WBC: 10.9 10*3/uL — ABNORMAL HIGH (ref 4.0–10.5)
nRBC: 0 % (ref 0.0–0.2)

## 2023-10-21 LAB — BASIC METABOLIC PANEL WITH GFR
Anion gap: 8 (ref 5–15)
BUN: 26 mg/dL — ABNORMAL HIGH (ref 8–23)
CO2: 25 mmol/L (ref 22–32)
Calcium: 8.7 mg/dL — ABNORMAL LOW (ref 8.9–10.3)
Chloride: 107 mmol/L (ref 98–111)
Creatinine, Ser: 1.17 mg/dL — ABNORMAL HIGH (ref 0.44–1.00)
GFR, Estimated: 48 mL/min — ABNORMAL LOW (ref >60)
Glucose, Bld: 122 mg/dL — ABNORMAL HIGH (ref 70–99)
Potassium: 3.9 mmol/L (ref 3.5–5.1)
Sodium: 140 mmol/L (ref 135–145)

## 2023-10-21 MED ORDER — PHENOL 1.4 % MT LIQD
1.0000 | OROMUCOSAL | Status: DC | PRN
  Filled 2023-10-21: qty 177

## 2023-10-21 MED ORDER — ALBUMIN HUMAN 25 % IV SOLN
25.0000 g | Freq: Once | INTRAVENOUS | Status: AC
  Administered 2023-10-21: 25 g via INTRAVENOUS
  Filled 2023-10-21: qty 100

## 2023-10-21 MED ORDER — METHYLPREDNISOLONE SODIUM SUCC 40 MG IJ SOLR
40.0000 mg | Freq: Once | INTRAMUSCULAR | Status: AC
  Administered 2023-10-22: 40 mg via INTRAVENOUS
  Filled 2023-10-21: qty 1

## 2023-10-21 MED ORDER — DIPHENHYDRAMINE HCL 25 MG PO CAPS: 50.0000 mg | ORAL_CAPSULE | Freq: Once | ORAL | Status: DC

## 2023-10-21 MED ORDER — PREDNISONE 20 MG PO TABS
50.0000 mg | ORAL_TABLET | Freq: Four times a day (QID) | ORAL | Status: AC
  Administered 2023-10-21 – 2023-10-22 (×3): 50 mg
  Filled 2023-10-21 (×3): qty 1

## 2023-10-21 MED ORDER — DIPHENHYDRAMINE HCL 25 MG PO CAPS
50.0000 mg | ORAL_CAPSULE | Freq: Once | ORAL | Status: AC
  Administered 2023-10-22: 50 mg
  Filled 2023-10-21: qty 2

## 2023-10-21 MED ORDER — DIPHENHYDRAMINE HCL 50 MG/ML IJ SOLN: 50.0000 mg | Freq: Once | INTRAMUSCULAR | Status: DC

## 2023-10-21 MED ORDER — PREDNISONE 20 MG PO TABS: 50.0000 mg | ORAL_TABLET | Freq: Four times a day (QID) | ORAL | Status: DC

## 2023-10-21 NOTE — Progress Notes (Signed)
 Started contrast allergy protocol. Gave 50 mg prednisone  thru NG tube and clamped. Will keep clamped for 1 hour.

## 2023-10-21 NOTE — Plan of Care (Signed)

## 2023-10-21 NOTE — Progress Notes (Signed)
  Progress Note   Patient: Susan Miller ZOX:[id number] DOB: [birth year] DOA: 10/20/2023     1 DOS: the patient was seen and examined on 10/21/2023   Brief hospital course:  Susan Miller is a 78 y.o. female with medical history significant of HTN, OSA, asthma, GERD who presented to the ED this morning for evaluation of abdominal pain that started yesterday.  She reports having been constipated, used a suppository yesterday with some stool.  Today she developed progressive lower abdominal pain.  She reports associated nausea and vomiting. ... Aaron AasSee H&P for full HPI on admission & ED course.  Patient was found to have small bowel obstruction. General surgery consulted in the ED, NG tube was placed. Patient admitted to medicine service for further evaluation and management.   Assessment and Plan:  SBO - --General surgery following --Surgery repeating CT scan today - follow results --NPO except ice chips --IV fluids --NG tube to intermittent suction --Monitor abdominal exam closely --Serial KUB's per surgery --IV Toradol and Fentanyl PRN   Hypertension --IV hydralazine PRN --Home PO meds   Asthma --PRN albuterol  --Breo   Anxiety  --Holding Zoloft  --PRN IV Ativan 0.5 mg   GERD --IV Protonix      Subjective: Pt seen awake resting in bed.  Abdomen feeling a little softer.  Pt reports sore throat, does not feel she'd do well swallowing pills right now, so wants to hold off resuming Zoloft  for now.  Had a bowel movement.  Physical Exam: Vitals:   10/20/23 2042 10/21/23 0419 10/21/23 0915 10/21/23 1558  BP: 123/64 136/72 (!) 153/72 139/60  Pulse: 84 82 77 80  Resp: 18  20 18   Temp: 98 F (36.7 C) 98.8 F (37.1 C) 98.9 F (37.2 C) 98.6 F (37 C)  TempSrc: Oral Oral Oral Oral  SpO2: 98% 96% 95% 94%  Weight:       General exam: awake, alert, no acute distress HEENT: NG tube in place to wall suction, moist mucus membranes, hearing grossly normal  Respiratory system:  CTAB, no wheezes, rales or rhonchi, normal respiratory effort. Cardiovascular system: normal S1/S2, RRR Gastrointestinal system: distended but softer since yesterday, no rebound or guarding Central nervous system: A&O x3. no gross focal neurologic deficits, normal speech Extremities: moves all , no edema, normal tone Skin: dry, intact, normal temperature, normal color, No rashes, lesions or ulcers Psychiatry: normal mood, congruent affect, judgement and insight appear normal   Data Reviewed:  Notable labs--  Glucose 122 BUN 26 Cr 1.17 Ca 8.7 WBC 10.9 from 14.2 Hbg 11.8   Family Communication: none present today. Son at bedside 6/14.   Disposition: Status is: Inpatient Remains inpatient appropriate because: NG tube still in place, NPO for SBO   Planned Discharge Destination: Home    Time spent: 40 minutes  Author: Montey Apa, DO 10/21/2023 4:41 PM  For on call review [email or web address].

## 2023-10-21 NOTE — Progress Notes (Signed)
 CC: SBO Subjective: Better as compared to yesterday.  Pain is improving.  NG tube 500 cc.  No flatus. KUB personally reviewed and shows some improvement of gas pattern.  No free air White count is improving Objective: Vital signs in last 24 hours: Temp:  [98 F (36.7 C)-98.9 F (37.2 C)] 98.9 F (37.2 C) (06/15 0915) Pulse Rate:  [77-84] 77 (06/15 0915) Resp:  [18-20] 20 (06/15 0915) BP: (123-153)/(64-72) 153/72 (06/15 0915) SpO2:  [95 %-98 %] 95 % (06/15 0915) Last BM Date : 10/21/23  Intake/Output from previous day: 06/14 0701 - 06/15 0700 In: 90.1 [I.V.:40.1; NG/GT:50] Out: 900 [Urine:400; Emesis/NG output:500] Intake/Output this shift: No intake/output data recorded.  Physical exam:  CONSTITUTIONAL: NAD. EYES: Pupils are equal, round,, Sclera are non-icteric. EARS, NOSE, MOUTH AND THROAT: The oropharynx is clear. The oral mucosa is pink and moist. Hearing is intact to voice. LYMPH NODES:  Lymph nodes in the neck are normal. RESPIRATORY:  Lungs are clear. There is normal respiratory effort, with equal breath sounds bilaterally, and without pathologic use of accessory muscles. CARDIOVASCULAR: Heart is regular without murmurs, gallops, or rubs. GI: The abdomen is  soft mildly distended and mild tenderness diffusely w/o rebound or peritonitis.  There are no palpable masses. There is no hepatosplenomegaly. There are normal bowel sounds  GU: Rectal deferred.   MUSCULOSKELETAL: Normal muscle strength and tone. No cyanosis or edema.   SKIN: Turgor is good and there are no pathologic skin lesions or ulcers. NEUROLOGIC: Motor and sensation is grossly normal. Cranial nerves are grossly intact. PSYCH:  Oriented to person, place and time. Affect is normal.   Lab Results: CBC  Recent Labs    10/20/23 0847 10/21/23 0532  WBC 14.2* 10.9*  HGB 13.9 11.8*  HCT 41.6 35.8*  PLT 296 236   BMET Recent Labs    10/20/23 0847 10/21/23 0532  NA 139 140  K 4.5 3.9  CL 102 107  CO2  27 25  GLUCOSE 180* 122*  BUN 17 26*  CREATININE 1.00 1.17*  CALCIUM  10.4* 8.7*   PT/INR No results for input(s): LABPROT, INR in the last 72 hours. ABG No results for input(s): PHART, HCO3 in the last 72 hours.  Invalid input(s): PCO2, PO2  Studies/Results: DG ABD ACUTE 2+V W 1V CHEST Result Date: 10/21/2023 CLINICAL DATA:  SBO EXAM: DG ABDOMEN ACUTE WITH 1 VIEW CHEST COMPARISON:  10/20/2023. FINDINGS: There is no evidence of dilated bowel loops or free intraperitoneal air. NG tube tip in the midline upper abdomen below the diaphragm. No radiopaque calculi or other significant radiographic abnormality is seen. Heart size and mediastinal contours are within normal limits. Both lungs are clear. IMPRESSION: Negative abdominal radiographs.  No acute cardiopulmonary disease. Electronically Signed   By: Sydell Eva M.D.   On: 10/21/2023 10:42   DG Abd Portable 1 View Result Date: 10/20/2023 CLINICAL DATA:  Enteric tube placement. EXAM: PORTABLE ABDOMEN - 1 VIEW COMPARISON:  Abdomen 02/12/2005 and chest x-ray 04/13/2023 FINDINGS: Enteric tube is present with tip over the stomach in the left upper quadrant just below the expected region of the gastroesophageal junction. Side port appears just above the expected region of the gastroesophageal junction as this could be advanced proximally 5 cm. Mild prominence of the infrahilar vessels which may be due to mild vascular congestion. Upper abdomen demonstrates nonobstructive bowel gas pattern. No free peritoneal air. IMPRESSION: Enteric tube with tip over the stomach in the left upper quadrant just below the expected region  of the gastroesophageal junction. Side port appears just above the expected region of the gastroesophageal junction as this could be advanced proximally 5 cm. Electronically Signed   By: Roda Cirri M.D.   On: 10/20/2023 12:51   CT ABDOMEN PELVIS WO CONTRAST Result Date: 10/20/2023 CLINICAL DATA:  Acute abdominal pain.  EXAM: CT ABDOMEN AND PELVIS WITHOUT CONTRAST TECHNIQUE: Multidetector CT imaging of the abdomen and pelvis was performed following the standard protocol without IV contrast. RADIATION DOSE REDUCTION: This exam was performed according to the departmental dose-optimization program which includes automated exposure control, adjustment of the mA and/or kV according to patient size and/or use of iterative reconstruction technique. COMPARISON:  08/27/2007 FINDINGS: Lower chest: No acute findings. Hepatobiliary: No mass visualized on this unenhanced exam. A few small hepatic cysts are again noted. A few tiny gallstones are seen, but without signs of cholecystitis or biliary ductal dilatation. Pancreas: No mass or inflammatory process visualized on this unenhanced exam. Spleen:  Within normal limits in size. Adrenals/Urinary tract: No evidence of urolithiasis or hydronephrosis. Unremarkable unopacified urinary bladder. Stomach/Bowel: Moderate hiatal hernia is seen. Moderately dilated fluid-filled small bowel loops are seen with transition point seen distally in the right lower quadrant, suspicious for adhesion. No mass or focal inflammatory process identified. Tiny amount of perihepatic and pelvic ascites noted. Vascular/Lymphatic: No pathologically enlarged lymph nodes identified. No evidence of abdominal aortic aneurysm. Reproductive:  No mass or other significant abnormality. Other:  None. Musculoskeletal:  No suspicious bone lesions identified. IMPRESSION: Distal small bowel obstruction, with transition point in right lower quadrant suspicious for adhesion. Tiny amount of ascites. Moderate hiatal hernia. Cholelithiasis. No radiographic evidence of cholecystitis. Electronically Signed   By: Marlyce Sine M.D.   On: 10/20/2023 10:57    Anti-infectives: Anti-infectives (From admission, onward)    None       Assessment/Plan: SBO seems to be improving, given that she has virgin abdomen I am ordering CT w po and iv  contrast, I have initiated protocol w benadryl and steroid, seems to be a remote and mild adverse reaction to contrast. She is not sure whether or not it was a CT scan or an MRI SHe does not need emergent surgical intervention.  Continue NG for now and increase IV fluids. I personally spent a total of 50 minutes in the care of the patient today including performing a medically appropriate exam/evaluation, counseling and educating, placing orders, referring and communicating with other health care professionals, documenting clinical information in the EHR, independently interpreting and reviewing images studies and coordinating care.     Evelia Hipp, MD, [facility]  10/21/2023

## 2023-10-22 ENCOUNTER — Inpatient Hospital Stay

## 2023-10-22 DIAGNOSIS — K56609 Unspecified intestinal obstruction, unspecified as to partial versus complete obstruction: Secondary | ICD-10-CM | POA: Diagnosis not present

## 2023-10-22 LAB — BASIC METABOLIC PANEL WITH GFR
Anion gap: 9 (ref 5–15)
BUN: 22 mg/dL (ref 8–23)
CO2: 25 mmol/L (ref 22–32)
Calcium: 8.9 mg/dL (ref 8.9–10.3)
Chloride: 110 mmol/L (ref 98–111)
Creatinine, Ser: 0.86 mg/dL (ref 0.44–1.00)
GFR, Estimated: >60 mL/min (ref >60)
Glucose, Bld: 138 mg/dL — ABNORMAL HIGH (ref 70–99)
Potassium: 3.9 mmol/L (ref 3.5–5.1)
Sodium: 144 mmol/L (ref 135–145)

## 2023-10-22 LAB — MAGNESIUM: Magnesium: 2.1 mg/dL (ref 1.7–2.4)

## 2023-10-22 MED ORDER — DIPHENHYDRAMINE-ZINC ACETATE 2-0.1 % EX CREA
TOPICAL_CREAM | Freq: Two times a day (BID) | CUTANEOUS | Status: DC | PRN
  Filled 2023-10-22: qty 28

## 2023-10-22 MED ORDER — IOHEXOL 300 MG/ML  SOLN
100.0000 mL | Freq: Once | INTRAMUSCULAR | Status: AC | PRN
  Administered 2023-10-22: 100 mL via INTRAVENOUS

## 2023-10-22 MED ORDER — DIPHENHYDRAMINE HCL 50 MG/ML IJ SOLN
25.0000 mg | Freq: Once | INTRAMUSCULAR | Status: AC
  Administered 2023-10-22: 25 mg via INTRAVENOUS
  Filled 2023-10-22: qty 1

## 2023-10-22 MED ORDER — DIPHENHYDRAMINE HCL 25 MG PO CAPS
25.0000 mg | ORAL_CAPSULE | Freq: Once | ORAL | Status: AC
  Administered 2023-10-22: 25 mg
  Filled 2023-10-22: qty 1

## 2023-10-22 MED ORDER — HYDROCORTISONE (PERIANAL) 2.5 % EX CREA
TOPICAL_CREAM | Freq: Two times a day (BID) | CUTANEOUS | Status: DC | PRN
  Filled 2023-10-22: qty 28.35

## 2023-10-22 MED ORDER — METHYLPREDNISOLONE SODIUM SUCC 40 MG IJ SOLR
40.0000 mg | Freq: Once | INTRAMUSCULAR | Status: AC
  Administered 2023-10-22: 40 mg via INTRAVENOUS
  Filled 2023-10-22: qty 1

## 2023-10-22 NOTE — Care Management Important Message (Signed)
 Important Message  Patient Details  Name: Susan Miller MRN: [id number] Date of Birth: October 24, 1945   Important Message Given:  Yes - Medicare IM     Anise Kerns 10/22/2023, 12:00 PM

## 2023-10-22 NOTE — TOC CM/SW Note (Signed)
 Transition of Care [facility]) - Inpatient Brief Assessment   Patient Details  Name: Susan KONDO MRN: [id number] Date of Birth: 03-07-1946  Transition of Care [facility]) CM/SW Contact:    Loman Risk, RN Phone Number: 10/22/2023, 11:19 AM   Clinical Narrative:    Transition of Care [facility]) Screening Note   Patient Details  Name: [name] Date of Birth: 12-26-1945   Transition of Care [facility]) CM/SW Contact:    Loman Risk, RN Phone Number: 10/22/2023, 11:19 AM    Transition of Care Department [facility]) has reviewed patient and no TOC needs have been identified at this time. If new patient transition needs arise, please place a TOC consult.   Transition of Care Asessment: Insurance and Status: Insurance coverage has been reviewed Patient has primary care physician: Yes     Prior/Current Home Services: No current home services Social Drivers of Health Review: SDOH reviewed no interventions necessary Readmission risk has been reviewed: Yes Transition of care needs: no transition of care needs at this time

## 2023-10-22 NOTE — Progress Notes (Signed)
 [facility] SURGICAL PROGRESS NOTE (cpt [phone])  Hospital Day(s): 2.  Interval History: Patient seen and examined, no acute events or new complaints overnight. Patient reports she is doing well. She denies any abdominal pain. No fever, chills, nausea, emesis. Labs this morning are reassuring. NGT with 600 ccs recorded yesterday; clamped at time of my evaluation. She reports she has passed flatus. Plan for CT this AM.   Review of Systems:  Constitutional: denies fever, chills  HEENT: denies cough or congestion  Respiratory: denies any shortness of breath  Cardiovascular: denies chest pain or palpitations  Gastrointestinal: denies abdominal pain, N/V Genitourinary: denies burning with urination or urinary frequency Musculoskeletal: denies pain, decreased motor or sensation  Vital signs in last 24 hours: [min-max] current  Temp:  [98.2 F (36.8 C)-99.1 F (37.3 C)] 98.2 F (36.8 C) (06/16 0507) Pulse Rate:  [77-95] 82 (06/16 0507) Resp:  [18-20] 20 (06/16 0507) BP: (133-158)/(60-77) 133/72 (06/16 0507) SpO2:  [93 %-95 %] 93 % (06/16 0507)       Weight: 102.7 kg BMI (Calculated): 35.45   Intake/Output last 2 shifts:  06/15 0701 - 06/16 0700 In: 2056.6 [I.V.:1796.6; NG/GT:160; IV Piggyback:100] Out: 600 [Emesis/NG output:600]   Physical Exam:  Constitutional: alert, cooperative and no distress  HENT: normocephalic without obvious abnormality; NGT in place; clamped Eyes: PERRL, EOM's grossly intact and symmetric  Respiratory: breathing non-labored at rest  Cardiovascular: regular rate and sinus rhythm  Gastrointestinal: soft, non-tender, and non-distended, no rebound/guarding Musculoskeletal: no edema or wounds, motor and sensation grossly intact, NT    Labs:     Latest Ref Rng & Units 10/21/2023    5:32 AM 10/20/2023    8:47 AM 03/21/2023   11:38 AM  CBC  WBC 4.0 - 10.5 K/uL 10.9  14.2  8.1   Hemoglobin 12.0 - 15.0 g/dL 44.0  34.7  42.5   Hematocrit 36.0 -  46.0 % 35.8  41.6  36.7   Platelets 150 - 400 K/uL 236  296  253       Latest Ref Rng & Units 10/22/2023    3:51 AM 10/21/2023    5:32 AM 10/20/2023    8:47 AM  CMP  Glucose 70 - 99 mg/dL 956  387  564   BUN 8 - 23 mg/dL 22  26  17    Creatinine 0.44 - 1.00 mg/dL 3.32  9.51  8.84   Sodium 135 - 145 mmol/L 144  140  139   Potassium 3.5 - 5.1 mmol/L 3.9  3.9  4.5   Chloride 98 - 111 mmol/L 110  107  102   CO2 22 - 32 mmol/L 25  25  27    Calcium  8.9 - 10.3 mg/dL 8.9  8.7  16.6   Total Protein 6.5 - 8.1 g/dL   7.7   Total Bilirubin 0.0 - 1.2 mg/dL   0.8   Alkaline Phos 38 - 126 U/L   71   AST 15 - 41 U/L   25   ALT 0 - 44 U/L   18      Imaging studies: No new pertinent imaging studies   Assessment/Plan: (ICD-10's: K53.609) 78 y.o. female with small bowel obstruction, complicated by lack of previous abdominal procedures   - Pending CT Abdomen/Pelvis this morning with PO/IV contrast to reassess intra-abdominal process given this presentation for possible obstruction and lack of previous abdominal surgeries. Will follow results   - For now, continue NGT decompression; LIS; monitor and record output  -  NPO + IVF support - No need for emergent intervention; follow up imaging  - Pain control prn; antiemetics prn - Mobilize   - Further management per primary service; we will follow   All of the above findings and recommendations were discussed with the patient, and the medical team, and all of patient's questions were answered to her expressed satisfaction.  -- [name], PA-C [facility] 10/22/2023, 8:10 AM M-F: 7am - 4pm

## 2023-10-22 NOTE — Progress Notes (Signed)
  Progress Note   Patient: Susan Miller ZOX:[id number] DOB: 26-Oct-1945 DOA: 10/20/2023     2 DOS: the patient was seen and examined on 10/22/2023   Brief hospital course:  DYLANN LAYNE is a 78 y.o. female with medical history significant of HTN, OSA, asthma, GERD who presented to the ED this morning for evaluation of abdominal pain that started yesterday.  She reports having been constipated, used a suppository yesterday with some stool.  Today she developed progressive lower abdominal pain.  She reports associated nausea and vomiting. ... Aaron AasSee H&P for full HPI on admission & ED course.  Patient was found to have small bowel obstruction. General surgery consulted in the ED, NG tube was placed. Patient admitted to medicine service for further evaluation and management.   Assessment and Plan:  SBO - --General surgery following --Surgery repeating CT scan today - follow results --NPO except ice chips --IV fluids --NG tube to intermittent suction --Monitor abdominal exam closely --Serial KUB's per surgery --IV Toradol and Fentanyl PRN   Hypertension --IV hydralazine PRN --Home PO meds   Asthma --PRN albuterol  --Breo   Anxiety  --Holding Zoloft  --PRN IV Ativan 0.5 mg   GERD --IV Protonix      Subjective: Pt seen awake resting in bed.  Reports feeling better.  Has passed gas a few times.  No N/V.  Throat spray helps a little, but doesn't quite get down far enough where throat is irritated, but it does help some.  Pt denies other acute complaints.  Recently back from CT.   Physical Exam: Vitals:   10/21/23 0915 10/21/23 1558 10/21/23 2023 10/22/23 0507  BP: (!) 153/72 139/60 (!) 158/77 133/72  Pulse: 77 80 95 82  Resp: 20 18 20 20   Temp: 98.9 F (37.2 C) 98.6 F (37 C) 99.1 F (37.3 C) 98.2 F (36.8 C)  TempSrc: Oral Oral Oral Oral  SpO2: 95% 94% 93% 93%  Weight:       General exam: awake, alert, no acute distress HEENT: NG tube in place to wall suction,  moist mucus membranes, hearing grossly normal  Respiratory system: CTAB, no wheezes, rales or rhonchi, normal respiratory effort. Cardiovascular system: normal S1/S2, RRR Gastrointestinal system: distended but softer since yesterday, no rebound or guarding Central nervous system: A&O x3. no gross focal neurologic deficits, normal speech Extremities: moves all , no edema, normal tone Skin: dry, intact, normal temperature, normal color, No rashes, lesions or ulcers Psychiatry: normal mood, congruent affect, judgement and insight appear normal   Data Reviewed:  Notable labs--  Glucose 138 Cr 1.17 >> 0.86  Last CBC 6/15 WBC 10.9 Hbg 11.8   Family Communication: none present today. Son at bedside 6/14.   Disposition: Status is: Inpatient Remains inpatient appropriate because: NG tube still in place, NPO for SBO   Planned Discharge Destination: Home    Time spent: 35 minutes  Author: Montey Apa, DO 10/22/2023 1:52 PM  For on call review [email or web address].

## 2023-10-23 DIAGNOSIS — K56609 Unspecified intestinal obstruction, unspecified as to partial versus complete obstruction: Secondary | ICD-10-CM | POA: Diagnosis not present

## 2023-10-23 MED ORDER — SERTRALINE HCL 25 MG PO TABS
37.5000 mg | ORAL_TABLET | Freq: Every day | ORAL | Status: DC
  Administered 2023-10-23 – 2023-10-24 (×2): 37.5 mg via ORAL
  Filled 2023-10-23 (×2): qty 1.5

## 2023-10-23 MED ORDER — FAMOTIDINE 20 MG PO TABS
40.0000 mg | ORAL_TABLET | Freq: Every day | ORAL | Status: DC
  Administered 2023-10-23 – 2023-10-24 (×2): 40 mg via ORAL
  Filled 2023-10-23 (×2): qty 2

## 2023-10-23 MED ORDER — ROSUVASTATIN CALCIUM 10 MG PO TABS
10.0000 mg | ORAL_TABLET | Freq: Every day | ORAL | Status: DC
  Administered 2023-10-23 – 2023-10-24 (×2): 10 mg via ORAL
  Filled 2023-10-23 (×2): qty 1

## 2023-10-23 MED ORDER — PANTOPRAZOLE SODIUM 40 MG PO TBEC
40.0000 mg | DELAYED_RELEASE_TABLET | Freq: Every day | ORAL | Status: DC
  Filled 2023-10-23: qty 1

## 2023-10-23 MED ORDER — ORAL CARE MOUTH RINSE: 15.0000 mL | OROMUCOSAL | Status: DC | PRN

## 2023-10-23 NOTE — Progress Notes (Signed)
  Progress Note   Patient: Susan Miller XBM:[id number] DOB: [birth year] DOA: 10/20/2023     3 DOS: the patient was seen and examined on 10/23/2023   Brief hospital course:  Susan Miller is a 78 y.o. female with medical history significant of HTN, OSA, asthma, GERD who presented to the ED this morning for evaluation of abdominal pain that started yesterday.  She reports having been constipated, used a suppository yesterday with some stool.  Today she developed progressive lower abdominal pain.  She reports associated nausea and vomiting. ... Susan AasSee H&P for full HPI on admission & ED course.  Patient was found to have small bowel obstruction. General surgery consulted in the ED, NG tube was placed. Patient admitted to medicine service for further evaluation and management.   Assessment and Plan:  SBO - --General surgery following --NG tube removed --Diet advanced to full liquids, soft diet later today if doing well --Stop IV fluids --Monitor abdominal exam closely --PRN pain control and antiemetics   Hypertension --IV hydralazine PRN --Hold losartan , hydrochlorothiazide  with soft BP's   Asthma --PRN albuterol  --Breo for home Advair   Anxiety  --Resume Zoloft  --PRN IV Ativan 0.5 mg   GERD --Change IV >> PO Protonix      Subjective: Pt seen awake resting in bed visiting with her best friend.  Overnight, pt had an allergic reaction where her face broke out red, in spread to her chest.  She'd been pre-treated to prevent IV contrast reaction yesterday, this was ovenright many hours later.  She did not have redness over the bridge of her nose where there had been tape when NGT was in place.  She's feeling better this AM, but felt scared by the whole episode and felt staff were not taking it very seriously.     Physical Exam: Vitals:   10/22/23 1844 10/22/23 2136 10/23/23 0229 10/23/23 0836  BP: 119/62 126/68 (!) 107/53 115/64  Pulse: 80 69 67 65  Resp: 20 20 20 16   Temp:  98 F (36.7 C) 98.1 F (36.7 C) 98 F (36.7 C) 97.7 F (36.5 C)  TempSrc: Oral Oral Oral   SpO2: 97% 96% 96% 98%  Weight:       General exam: awake, alert, no acute distress HEENT: interval NG tube removal, left cheek erythema mild, moist mucus membranes, hearing grossly normal  Respiratory system: CTAB, no wheezes, rales or rhonchi, normal respiratory effort. Cardiovascular system: normal S1/S2, RRR Gastrointestinal system: distended but softer since yesterday, no rebound or guarding Central nervous system: A&O x3. no gross focal neurologic deficits, normal speech Extremities: moves all , trace ankle edema Skin: dry, intact, normal temperature Psychiatry: normal mood, congruent affect, judgement and insight appear normal   Data Reviewed:  Notable labs 6/16--  Glucose 138 Cr 1.17 >> 0.86  Last CBC 6/15 WBC 10.9 Hbg 11.8   Family Communication: best friend at bedside on rounds this AM  Disposition: Status is: Inpatient Remains inpatient appropriate because: pending surgery's clearance for d/c, advancing diet and monitoring for ongoing improvement of SBO   Planned Discharge Destination: Home    Time spent: 35 minutes  Author: Montey Apa, DO 10/23/2023 2:36 PM  For on call review [email or web address].

## 2023-10-23 NOTE — Plan of Care (Signed)
   Problem: Education: Goal: Knowledge of General Education information will improve Description Including pain rating scale, medication(s)/side effects and non-pharmacologic comfort measures Outcome: Progressing   Problem: Clinical Measurements: Goal: Ability to maintain clinical measurements within normal limits will improve Outcome: Progressing   Problem: Clinical Measurements: Goal: Will remain free from infection Outcome: Progressing

## 2023-10-23 NOTE — Progress Notes (Signed)
 10/23/2023  Subjective: Patient had CT scan yesterday showing no evidence of small bowel obstruction, though some thickening of the distal small bowel wall.  Follow up KUB showed contrast in the colon.  She had what appears to be an allergic reaction overnight to the contrast but is doing better this morning.  From abdominal standpoint, reports that she continues to pass flatus and denies any abdominal pain.  Tolerated clear liquids yesterday.  Vital signs: Temp:  [98 F (36.7 C)-98.1 F (36.7 C)] 98 F (36.7 C) (06/17 0229) Pulse Rate:  [67-80] 67 (06/17 0229) Resp:  [20] 20 (06/17 0229) BP: (107-126)/(53-68) 107/53 (06/17 0229) SpO2:  [96 %-97 %] 96 % (06/17 0229)   Intake/Output: 06/16 0701 - 06/17 0700 In: 2581.3 [P.O.:240; I.V.:2341.3] Out: 500  Last BM Date : 10/21/23  Physical Exam: Constitutional:  No acute distress Abdomen:  soft, non-distended, non-tender to palpation.  Labs:  Recent Labs    10/20/23 0847 10/21/23 0532  WBC 14.2* 10.9*  HGB 13.9 11.8*  HCT 41.6 35.8*  PLT 296 236   Recent Labs    10/21/23 0532 10/22/23 0351  NA 140 144  K 3.9 3.9  CL 107 110  CO2 25 25  GLUCOSE 122* 138*  BUN 26* 22  CREATININE 1.17* 0.86  CALCIUM  8.7* 8.9   No results for input(s): LABPROT, INR in the last 72 hours.  Imaging: DG Abd Portable 2V Result Date: 10/22/2023 CLINICAL DATA:  Small-bowel obstruction EXAM: PORTABLE ABDOMEN - 2 VIEW COMPARISON:  CT abdomen and pelvis October 22, 2023, KUB October 21, 2023 FINDINGS: Nasogastric tube in the gastroesophageal junction fundus of the stomach Contrast throughout the colon. Nondilated minimally air distended bowel loops correlate with nonspecific ileus. Residual contrast in the bladder. IMPRESSION: No evidence of bowel obstruction Electronically Signed   By: Fredrich Jefferson M.D.   On: 10/22/2023 13:46   CT ABDOMEN PELVIS W CONTRAST Result Date: 10/22/2023 CLINICAL DATA:  Bowel obstruction. EXAM: CT ABDOMEN AND PELVIS WITH  CONTRAST TECHNIQUE: Multidetector CT imaging of the abdomen and pelvis was performed using the standard protocol following bolus administration of intravenous contrast. RADIATION DOSE REDUCTION: This exam was performed according to the departmental dose-optimization program which includes automated exposure control, adjustment of the mA and/or kV according to patient size and/or use of iterative reconstruction technique. CONTRAST:  OMNIPAQUE IOHEXOL 300 MG/ML  SOLN COMPARISON:  10/20/2023 FINDINGS: Lower chest: Dependent atelectasis noted in the lung bases. 5 mm right lower lobe nodule seen on 27/4. Other scattered tiny pulmonary nodules evident. Hepatobiliary: 2 cm well-defined homogeneous low-density lesion in the central liver was present on a CT scan of 08/27/2007 measuring about 8 mm at that time. Additional scattered tiny hypodensities in the liver parenchyma are too small to characterize but likely benign. Gallstones are identified without CT evidence for gallbladder wall thickening or pericholecystic fluid. No intrahepatic or extrahepatic biliary dilation. Pancreas: No focal mass lesion. No dilatation of the main duct. No intraparenchymal cyst. No peripancreatic edema. Spleen: No splenomegaly. No suspicious focal mass lesion. Adrenals/Urinary Tract: Stable thickening of both adrenal glands. Kidneys unremarkable. Tiny well-defined homogeneous low-density lesions in both kidneys are too small to characterize but are statistically most likely benign and probably cysts. No followup imaging is recommended. Central sinus cysts are noted in the left kidney. No evidence for hydroureter. The urinary bladder appears normal for the degree of distention. Stomach/Bowel: Moderate hiatal hernia. The stomach is decompressed by NG tube. Duodenum is normally positioned as is the  ligament of Treitz. No small bowel dilatation. Oral contrast is only migrated about halfway to the small bowel with distal loops unopacified.  Within this limitation there is some wall thickening in distal small bowel in the right lower quadrant and adjacent congestion in the subtending mesentery. This corresponds 2 a similar region seen on the CT scan from 2 days ago. Circumferential wall thickening and small bowel is well demonstrated on axial 77/2. Cecal tip is mobile and positioned in the anterior right abdomen cranial to the level of the umbilicus. The terminal ileum is normal. The appendix is not well visualized, but there is no edema or inflammation in the region of the cecal tip to suggest appendicitis. No gross colonic mass. No colonic wall thickening. A few diverticuli are seen in the left colon without diverticulitis. Vascular/Lymphatic: There is mild atherosclerotic calcification of the abdominal aorta without aneurysm. Portal vein and superior mesenteric vein are patent. There is no gastrohepatic or hepatoduodenal ligament lymphadenopathy. No retroperitoneal or mesenteric lymphadenopathy. No pelvic sidewall lymphadenopathy. Reproductive: Unremarkable. Other: Trace free fluid in the cul-de-sac. Musculoskeletal: No worrisome lytic or sclerotic osseous abnormality. IMPRESSION: 1. Interval improvement in the small bowel dilatation seen on the previous CT scan. Small bowel loops on the current study are only minimally distended proximally up to a bowel 2.5 cm diameter. Oral contrast has only migrated about halfway through the small bowel with distal loops remaining unopacified. Within this limitation there is some wall thickening in distal small bowel in the right lower quadrant and adjacent congestion in the subtending mesentery. This corresponds to a similar region seen on the CT scan from 2 days ago. No overt transition zone evident. Features could reflect sequelae of resolving mechanical small-bowel obstruction or infectious/inflammatory enteritis. 2. Moderate hiatal hernia. 3. Cholelithiasis. 4. Trace free fluid in the cul-de-sac. 5. 5 mm  right lower lobe pulmonary nodule. No follow-up needed if patient is low-risk.This recommendation follows the consensus statement: Guidelines for Management of Incidental Pulmonary Nodules Detected on CT Images: From the Fleischner Society 2017; Radiology 2017; 284:228-243. 6.  Aortic Atherosclerosis (ICD10-I70.0). Electronically Signed   By: Donnal Fusi M.D.   On: 10/22/2023 11:24    Assessment/Plan: This is a 78 y.o. female with small bowel obstruction.  --From abdominal standpoint, patient continues to improve and do well.  She's tolerated clear liquids and is having flatus.  No BM yet but she feels it's coming. --Will advance to full liquid diet this morning.  Depending on her progress today, may be able to advance to soft diet for dinner vs tomorrow morning. --Could potentially d/c home tomorrow.   I spent 35 minutes dedicated to the care of this patient on the date of this encounter to include pre-visit review of records, face-to-face time with the patient discussing diagnosis and management, and any post-visit coordination of care.  Marene Shape, MD [facility]

## 2023-10-23 NOTE — Plan of Care (Signed)

## 2023-10-24 DIAGNOSIS — K56609 Unspecified intestinal obstruction, unspecified as to partial versus complete obstruction: Secondary | ICD-10-CM | POA: Diagnosis not present

## 2023-10-24 LAB — CBC
HCT: 32.3 % — ABNORMAL LOW (ref 36.0–46.0)
Hemoglobin: 10.8 g/dL — ABNORMAL LOW (ref 12.0–15.0)
MCH: 30.6 pg (ref 26.0–34.0)
MCHC: 33.4 g/dL (ref 30.0–36.0)
MCV: 91.5 fL (ref 80.0–100.0)
Platelets: 212 10*3/uL (ref 150–400)
RBC: 3.53 MIL/uL — ABNORMAL LOW (ref 3.87–5.11)
RDW: 13.6 % (ref 11.5–15.5)
WBC: 9.1 10*3/uL (ref 4.0–10.5)
nRBC: 0 % (ref 0.0–0.2)

## 2023-10-24 LAB — BASIC METABOLIC PANEL WITH GFR
Anion gap: 7 (ref 5–15)
BUN: 24 mg/dL — ABNORMAL HIGH (ref 8–23)
CO2: 26 mmol/L (ref 22–32)
Calcium: 8.7 mg/dL — ABNORMAL LOW (ref 8.9–10.3)
Chloride: 108 mmol/L (ref 98–111)
Creatinine, Ser: 1.11 mg/dL — ABNORMAL HIGH (ref 0.44–1.00)
GFR, Estimated: 51 mL/min — ABNORMAL LOW (ref >60)
Glucose, Bld: 106 mg/dL — ABNORMAL HIGH (ref 70–99)
Potassium: 3.9 mmol/L (ref 3.5–5.1)
Sodium: 141 mmol/L (ref 135–145)

## 2023-10-24 NOTE — Progress Notes (Signed)
 Discharge instructions were reviewed with patient. Questions were encouraged and answered, belongings collected by patient.

## 2023-10-24 NOTE — Progress Notes (Incomplete)
  Progress Note   Patient: Susan Miller ZOX:[id number] DOB: 15-Apr-1946 DOA: 10/20/2023     4 DOS: the patient was seen and examined on 10/24/2023   Brief hospital course:  Susan Miller is a 78 y.o. female with medical history significant of HTN, OSA, asthma, GERD who presented to the ED this morning for evaluation of abdominal pain that started yesterday.  She reports having been constipated, used a suppository yesterday with some stool.  Today she developed progressive lower abdominal pain.  She reports associated nausea and vomiting. ... Susan Miller H&P for full HPI on admission & ED course.  Patient was found to have small bowel obstruction. General surgery consulted in the ED, NG tube was placed. Patient admitted to medicine service for further evaluation and management.   Assessment and Plan:  SBO - --General surgery following --NG tube removed --Diet advanced to full liquids, soft diet later today if doing well --Stop IV fluids --Monitor abdominal exam closely --PRN pain control and antiemetics   Hypertension --IV hydralazine PRN --Hold losartan , hydrochlorothiazide  with soft BP's   Asthma --PRN albuterol  --Breo for home Advair   Anxiety  --Resume Zoloft  --PRN IV Ativan 0.5 mg   GERD --Change IV >> PO Protonix    {Tip this will not be part of the note when signed Body mass index is 35.46 kg/m. , ,  (Optional):26781}  Subjective: ***  Physical Exam: Vitals:   10/23/23 1528 10/23/23 2055 10/23/23 2056 10/24/23 0459  BP: 128/70 124/70  138/60  Pulse: 71 66  75  Resp: 16 16  16   Temp: 98.1 F (36.7 C) 98 F (36.7 C)  99.6 F (37.6 C)  TempSrc:    Oral  SpO2: 97% 98%  94%  Weight:      Height:   5' 7 (1.702 m)    General exam: awake, alert, no acute distress HEENT: interval NG tube removal, left cheek erythema mild, moist mucus membranes, hearing grossly normal  Respiratory system: CTAB, no wheezes, rales or rhonchi, normal respiratory  effort. Cardiovascular system: normal S1/S2, RRR Gastrointestinal system: distended but softer since yesterday, no rebound or guarding Central nervous system: A&O x3. no gross focal neurologic deficits, normal speech Extremities: moves all , trace ankle edema Skin: dry, intact, normal temperature Psychiatry: normal mood, congruent affect, judgement and insight appear normal   Data Reviewed:  Notable labs 6/16--  Glucose 138 Cr 1.17 >> 0.86  Last CBC 6/15 WBC 10.9 Hbg 11.8   Family Communication: best friend at bedside on rounds this AM  Disposition: Status is: Inpatient Remains inpatient appropriate because: pending surgery's clearance for d/c, advancing diet and monitoring for ongoing improvement of SBO   Planned Discharge Destination: Home {Tip this will not be part of the note when signed  DVT Prophylaxis  .,  (Optional):26781}   Time spent: 35 minutes  Author: Ree Candy, MD 10/24/2023 7:31 AM  For on call review [email or web address].

## 2023-10-24 NOTE — Progress Notes (Signed)
 [facility] SURGICAL PROGRESS NOTE (cpt [phone])  Hospital Day(s): 4.  Interval History: Patient seen and examined, no acute events or new complaints overnight. Patient reports she feels good this morning. No abdominal pain. No distension. She denied fever, chills, nausea, emesis. Labs remain reassuring. She has been advanced to soft diet; no issues. She continues to pass flatus.    Review of Systems:  Constitutional: denies fever, chills  HEENT: denies cough or congestion  Respiratory: denies any shortness of breath  Cardiovascular: denies chest pain or palpitations  Gastrointestinal: denies abdominal pain, N/V Genitourinary: denies burning with urination or urinary frequency Musculoskeletal: denies pain, decreased motor or sensation  Vital signs in last 24 hours: [min-max] current  Temp:  [97.7 F (36.5 C)-99.6 F (37.6 C)] 99.6 F (37.6 C) (06/18 0459) Pulse Rate:  [65-75] 75 (06/18 0459) Resp:  [16] 16 (06/18 0459) BP: (115-138)/(60-70) 138/60 (06/18 0459) SpO2:  [94 %-98 %] 94 % (06/18 0459)     Height: 5' 7 (170.2 cm) Weight: 102.7 kg BMI (Calculated): 35.45   Intake/Output last 2 shifts:  06/17 0701 - 06/18 0700 In: 480 [P.O.:480] Out: -    Physical Exam:  Constitutional: alert, cooperative and no distress  HENT: normocephalic without obvious abnormality Eyes: PERRL, EOM's grossly intact and symmetric  Respiratory: breathing non-labored at rest  Cardiovascular: regular rate and sinus rhythm  Gastrointestinal: soft, non-tender, and non-distended, no rebound/guarding Musculoskeletal: no edema or wounds, motor and sensation grossly intact, NT    Labs:     Latest Ref Rng & Units 10/24/2023    4:09 AM 10/21/2023    5:32 AM 10/20/2023    8:47 AM  CBC  WBC 4.0 - 10.5 K/uL 9.1  10.9  14.2   Hemoglobin 12.0 - 15.0 g/dL 60.4  54.0  98.1   Hematocrit 36.0 - 46.0 % 32.3  35.8  41.6   Platelets 150 - 400 K/uL 212  236  296       Latest Ref Rng & Units  10/24/2023    4:09 AM 10/22/2023    3:51 AM 10/21/2023    5:32 AM  CMP  Glucose 70 - 99 mg/dL 191  478  295   BUN 8 - 23 mg/dL 24  22  26    Creatinine 0.44 - 1.00 mg/dL 6.21  3.08  6.57   Sodium 135 - 145 mmol/L 141  144  140   Potassium 3.5 - 5.1 mmol/L 3.9  3.9  3.9   Chloride 98 - 111 mmol/L 108  110  107   CO2 22 - 32 mmol/L 26  25  25    Calcium  8.9 - 10.3 mg/dL 8.7  8.9  8.7      Imaging studies: No new pertinent imaging studies   Assessment/Plan: (ICD-10's: K42.609) 78 y.o. female with small bowel obstruction, complicated by lack of previous abdominal procedures   - Okay to continue soft diet as tolerated - No need for emergent intervention - Monitor abdominal examination; on-going bowel function   - Pain control prn; antiemetics prn - Mobilize as tolerated  - Further management per primary service   - Discharge Planning: Okay for discharge from surgical perspective. She does NOT need surgical follow up.   All of the above findings and recommendations were discussed with the patient, and the medical team, and all of patient's questions were answered to her expressed satisfaction.  -- [name], PA-C [facility] 10/24/2023, 7:46 AM M-F: 7am - 4pm

## 2023-10-24 NOTE — Plan of Care (Signed)

## 2023-10-24 NOTE — Discharge Summary (Signed)
 Physician Discharge Summary  Patient: Susan Miller:[id number] DOB: March 22, 1946   Code Status: Full Code Admit date: 10/20/2023 Discharge date: 10/24/2023 Disposition: Home, No home health services recommended PCP: Dellar Fenton, MD  Recommendations for Outpatient Follow-up:  Follow up with PCP within 1-2 weeks Regarding general hospital follow up and preventative care Recommend HTN monitoring. Held hydrochlorothiazide  during hospitalization and at dc Follow up with general surgery  Discharge Diagnoses:  Principal Problem:   Small bowel obstruction [facility])  Brief Hospital Course Summary:  Susan Miller is a 78 y.o. female with medical history significant of HTN, OSA, asthma, GERD who presented to the ED this morning for evaluation of abdominal pain associated with constipation.    Patient was found to have small bowel obstruction. General surgery consulted in the ED, NG tube was placed. And was managed with bowel rest and conservative therapy.  She was gradually weaned from NG tube and trialed on advanced diet. By time of discharge, she had several small Bms and passing gas freely. Able to tolerate a modest soft diet without N/V or abdominal pain.   All other chronic conditions were treated with home medications.    Discharge Condition: Good, improved Recommended discharge diet: Regular healthy diet  Consultations: General surgery   Procedures/Studies: NG tube  Allergies as of 10/24/2023       Reactions   Codeine Hives   Iodinated Contrast Media Swelling        Medication List     STOP taking these medications    hydrochlorothiazide  25 MG tablet Commonly known as: HYDRODIURIL        TAKE these medications    albuterol  108 (90 Base) MCG/ACT inhaler Commonly known as: VENTOLIN  HFA INHALE 2 PUFFS INTO THE LUNGS EVERY 6 HOURS AS NEEDED FOR WHEEZING OR SHORTNESS OF BREATH   albuterol  (2.5 MG/3ML) 0.083% nebulizer solution Commonly known as: PROVENTIL  Take  3 mLs (2.5 mg total) by nebulization every 4 (four) hours as needed for wheezing or shortness of breath.   esomeprazole  40 MG capsule Commonly known as: NEXIUM  Take 1 capsule (40 mg total) by mouth daily.   fluticasone -salmeterol 250-50 MCG/ACT Aepb Commonly known as: ADVAIR Inhale 1 puff into the lungs in the morning and at bedtime.   losartan  25 MG tablet Commonly known as: COZAAR  TAKE 1 TABLET(25 MG) BY MOUTH DAILY   rosuvastatin  10 MG tablet Commonly known as: CRESTOR  Take 1 tablet (10 mg total) by mouth daily.   sertraline  25 MG tablet Commonly known as: ZOLOFT  Take 1 1/2 tablet per day.        Follow-up Information     [facility] Follow up.   Specialty: General Surgery Why: As needed Contact information: [address] [place]  [address] [phone]                Subjective   Pt reports feeling well. She is ambulating without concern, tolerated soft diet without N/V abdominal pain. Feels ready to go home.   All questions and concerns were addressed at time of discharge.  Objective  Blood pressure (!) 155/74, pulse 75, temperature 98.4 F (36.9 C), temperature source Oral, resp. rate 20, height 5' 7 (1.702 m), weight 102.7 kg, SpO2 96%.   General: Pt is alert, awake, not in acute distress Cardiovascular: RRR, S1/S2 +, no rubs, no gallops Respiratory: CTA bilaterally, no wheezing, no rhonchi Abdominal: Soft, NT, ND, bowel sounds + Extremities: no edema, no cyanosis  The  results of significant diagnostics from this hospitalization (including imaging, microbiology, ancillary and laboratory) are listed below for reference.   Imaging studies: DG Abd Portable 2V Result Date: 10/22/2023 CLINICAL DATA:  Small-bowel obstruction EXAM: PORTABLE ABDOMEN - 2 VIEW COMPARISON:  CT abdomen and pelvis October 22, 2023, KUB October 21, 2023 FINDINGS: Nasogastric tube in the gastroesophageal junction  fundus of the stomach Contrast throughout the colon. Nondilated minimally air distended bowel loops correlate with nonspecific ileus. Residual contrast in the bladder. IMPRESSION: No evidence of bowel obstruction Electronically Signed   By: Fredrich Jefferson M.D.   On: 10/22/2023 13:46   CT ABDOMEN PELVIS W CONTRAST Result Date: 10/22/2023 CLINICAL DATA:  Bowel obstruction. EXAM: CT ABDOMEN AND PELVIS WITH CONTRAST TECHNIQUE: Multidetector CT imaging of the abdomen and pelvis was performed using the standard protocol following bolus administration of intravenous contrast. RADIATION DOSE REDUCTION: This exam was performed according to the departmental dose-optimization program which includes automated exposure control, adjustment of the mA and/or kV according to patient size and/or use of iterative reconstruction technique. CONTRAST:  OMNIPAQUE IOHEXOL 300 MG/ML  SOLN COMPARISON:  10/20/2023 FINDINGS: Lower chest: Dependent atelectasis noted in the lung bases. 5 mm right lower lobe nodule seen on 27/4. Other scattered tiny pulmonary nodules evident. Hepatobiliary: 2 cm well-defined homogeneous low-density lesion in the central liver was present on a CT scan of 08/27/2007 measuring about 8 mm at that time. Additional scattered tiny hypodensities in the liver parenchyma are too small to characterize but likely benign. Gallstones are identified without CT evidence for gallbladder wall thickening or pericholecystic fluid. No intrahepatic or extrahepatic biliary dilation. Pancreas: No focal mass lesion. No dilatation of the main duct. No intraparenchymal cyst. No peripancreatic edema. Spleen: No splenomegaly. No suspicious focal mass lesion. Adrenals/Urinary Tract: Stable thickening of both adrenal glands. Kidneys unremarkable. Tiny well-defined homogeneous low-density lesions in both kidneys are too small to characterize but are statistically most likely benign and probably cysts. No followup imaging is recommended.  Central sinus cysts are noted in the left kidney. No evidence for hydroureter. The urinary bladder appears normal for the degree of distention. Stomach/Bowel: Moderate hiatal hernia. The stomach is decompressed by NG tube. Duodenum is normally positioned as is the ligament of Treitz. No small bowel dilatation. Oral contrast is only migrated about halfway to the small bowel with distal loops unopacified. Within this limitation there is some wall thickening in distal small bowel in the right lower quadrant and adjacent congestion in the subtending mesentery. This corresponds 2 a similar region seen on the CT scan from 2 days ago. Circumferential wall thickening and small bowel is well demonstrated on axial 77/2. Cecal tip is mobile and positioned in the anterior right abdomen cranial to the level of the umbilicus. The terminal ileum is normal. The appendix is not well visualized, but there is no edema or inflammation in the region of the cecal tip to suggest appendicitis. No gross colonic mass. No colonic wall thickening. A few diverticuli are seen in the left colon without diverticulitis. Vascular/Lymphatic: There is mild atherosclerotic calcification of the abdominal aorta without aneurysm. Portal vein and superior mesenteric vein are patent. There is no gastrohepatic or hepatoduodenal ligament lymphadenopathy. No retroperitoneal or mesenteric lymphadenopathy. No pelvic sidewall lymphadenopathy. Reproductive: Unremarkable. Other: Trace free fluid in the cul-de-sac. Musculoskeletal: No worrisome lytic or sclerotic osseous abnormality. IMPRESSION: 1. Interval improvement in the small bowel dilatation seen on the previous CT scan. Small bowel loops on the current study are only minimally  distended proximally up to a bowel 2.5 cm diameter. Oral contrast has only migrated about halfway through the small bowel with distal loops remaining unopacified. Within this limitation there is some wall thickening in distal small  bowel in the right lower quadrant and adjacent congestion in the subtending mesentery. This corresponds to a similar region seen on the CT scan from 2 days ago. No overt transition zone evident. Features could reflect sequelae of resolving mechanical small-bowel obstruction or infectious/inflammatory enteritis. 2. Moderate hiatal hernia. 3. Cholelithiasis. 4. Trace free fluid in the cul-de-sac. 5. 5 mm right lower lobe pulmonary nodule. No follow-up needed if patient is low-risk.This recommendation follows the consensus statement: Guidelines for Management of Incidental Pulmonary Nodules Detected on CT Images: From the Fleischner Society 2017; Radiology 2017; 284:228-243. 6.  Aortic Atherosclerosis (ICD10-I70.0). Electronically Signed   By: Donnal Fusi M.D.   On: 10/22/2023 11:24   DG ABD ACUTE 2+V W 1V CHEST Result Date: 10/21/2023 CLINICAL DATA:  SBO EXAM: DG ABDOMEN ACUTE WITH 1 VIEW CHEST COMPARISON:  10/20/2023. FINDINGS: There is no evidence of dilated bowel loops or free intraperitoneal air. NG tube tip in the midline upper abdomen below the diaphragm. No radiopaque calculi or other significant radiographic abnormality is seen. Heart size and mediastinal contours are within normal limits. Both lungs are clear. IMPRESSION: Negative abdominal radiographs.  No acute cardiopulmonary disease. Electronically Signed   By: Sydell Eva M.D.   On: 10/21/2023 10:42   DG Abd Portable 1 View Result Date: 10/20/2023 CLINICAL DATA:  Enteric tube placement. EXAM: PORTABLE ABDOMEN - 1 VIEW COMPARISON:  Abdomen 02/12/2005 and chest x-ray 04/13/2023 FINDINGS: Enteric tube is present with tip over the stomach in the left upper quadrant just below the expected region of the gastroesophageal junction. Side port appears just above the expected region of the gastroesophageal junction as this could be advanced proximally 5 cm. Mild prominence of the infrahilar vessels which may be due to mild vascular congestion. Upper  abdomen demonstrates nonobstructive bowel gas pattern. No free peritoneal air. IMPRESSION: Enteric tube with tip over the stomach in the left upper quadrant just below the expected region of the gastroesophageal junction. Side port appears just above the expected region of the gastroesophageal junction as this could be advanced proximally 5 cm. Electronically Signed   By: Roda Cirri M.D.   On: 10/20/2023 12:51   CT ABDOMEN PELVIS WO CONTRAST Result Date: 10/20/2023 CLINICAL DATA:  Acute abdominal pain. EXAM: CT ABDOMEN AND PELVIS WITHOUT CONTRAST TECHNIQUE: Multidetector CT imaging of the abdomen and pelvis was performed following the standard protocol without IV contrast. RADIATION DOSE REDUCTION: This exam was performed according to the departmental dose-optimization program which includes automated exposure control, adjustment of the mA and/or kV according to patient size and/or use of iterative reconstruction technique. COMPARISON:  08/27/2007 FINDINGS: Lower chest: No acute findings. Hepatobiliary: No mass visualized on this unenhanced exam. A few small hepatic cysts are again noted. A few tiny gallstones are seen, but without signs of cholecystitis or biliary ductal dilatation. Pancreas: No mass or inflammatory process visualized on this unenhanced exam. Spleen:  Within normal limits in size. Adrenals/Urinary tract: No evidence of urolithiasis or hydronephrosis. Unremarkable unopacified urinary bladder. Stomach/Bowel: Moderate hiatal hernia is seen. Moderately dilated fluid-filled small bowel loops are seen with transition point seen distally in the right lower quadrant, suspicious for adhesion. No mass or focal inflammatory process identified. Tiny amount of perihepatic and pelvic ascites noted. Vascular/Lymphatic: No pathologically enlarged lymph nodes identified.  No evidence of abdominal aortic aneurysm. Reproductive:  No mass or other significant abnormality. Other:  None. Musculoskeletal:  No  suspicious bone lesions identified. IMPRESSION: Distal small bowel obstruction, with transition point in right lower quadrant suspicious for adhesion. Tiny amount of ascites. Moderate hiatal hernia. Cholelithiasis. No radiographic evidence of cholecystitis. Electronically Signed   By: Marlyce Sine M.D.   On: 10/20/2023 10:57    Labs: Basic Metabolic Panel: Recent Labs  Lab 10/20/23 0847 10/21/23 0532 10/22/23 0351 10/24/23 0409  NA 139 140 144 141  K 4.5 3.9 3.9 3.9  CL 102 107 110 108  CO2 27 25 25 26   GLUCOSE 180* 122* 138* 106*  BUN 17 26* 22 24*  CREATININE 1.00 1.17* 0.86 1.11*  CALCIUM  10.4* 8.7* 8.9 8.7*  MG  --   --  2.1  --    CBC: Recent Labs  Lab 10/20/23 0847 10/21/23 0532 10/24/23 0409  WBC 14.2* 10.9* 9.1  HGB 13.9 11.8* 10.8*  HCT 41.6 35.8* 32.3*  MCV 90.0 91.6 91.5  PLT 296 236 212   Microbiology: Results for orders placed or performed in visit on 07/20/23  Microscopic Examination     Status: Abnormal   Collection Time: 07/20/23 11:07 AM   Urine  Result Value Ref Range Status   WBC, UA 6-10 (A) 0 - 5 /hpf Final   RBC, Urine None seen 0 - 2 /hpf Final   Epithelial Cells (non renal) 0-10 0 - 10 /hpf Final   Bacteria, UA Few None seen/Few Final    Time coordinating discharge: Over 30 minutes  Ree Candy, MD  [facility] 10/24/2023, 9:45 AM

## 2023-10-24 NOTE — Plan of Care (Signed)

## 2023-10-24 NOTE — Discharge Instructions (Signed)
 Please gradually increase your diet and follow up with your primary doctor in 1-2 weeks.  Follow up with surgery as they instructed I have stopped your hydrochlorothiazide  blood pressure medication for now since you did not take it during the admission and did not appear to need it. You can discuss with your PCP if you need to restart or would be ok to continue without it.

## 2023-10-25 ENCOUNTER — Telehealth: Payer: Self-pay

## 2023-10-25 NOTE — Transitions of Care (Post Inpatient/ED Visit) (Signed)
 10/25/2023  Name: Susan Miller MRN: [id number] DOB: 1946-01-09  Today's TOC FU Call Status: Today's TOC FU Call Status:: Successful TOC FU Call Completed TOC FU Call Complete Date: 10/25/23 Patient's Name and Date of Birth confirmed.  Transition Care Management Follow-up Telephone Call Date of Discharge: 10/24/23 Discharge Facility: [facility]) Type of Discharge: Inpatient Admission Primary Inpatient Discharge Diagnosis:: SBO How have you been since you were released from the hospital?: Better Any questions or concerns?: No  Items Reviewed: Did you receive and understand the discharge instructions provided?: Yes Medications obtained,verified, and reconciled?: Yes (Medications Reviewed) Any new allergies since your discharge?: No Dietary orders reviewed?: Yes Type of Diet Ordered:: Low Sodium Heart Healthy Do you have support at home?: Yes People in Home [RPT]: child(ren), adult Name of Support/Comfort Primary Source: [name]  Medications Reviewed Today: Medications Reviewed Today     Reviewed by Claudene Crystal, RN (Case Manager) on 10/25/23 at 1450  Med List Status: <None>   Medication Order Taking? Sig Documenting Provider Last Dose Status Informant  albuterol  (PROVENTIL ) (2.5 MG/3ML) 0.083% nebulizer solution [id number] Yes Take 3 mLs (2.5 mg total) by nebulization every 4 (four) hours as needed for wheezing or shortness of breath. Marc Senior, MD  Active Self           Med Note Hali Leventhal   UXL Oct 20, 2023 12:46 PM) PRN  albuterol  (VENTOLIN  HFA) 108 (90 Base) MCG/ACT inhaler [id number] Yes INHALE 2 PUFFS INTO THE LUNGS EVERY 6 HOURS AS NEEDED FOR WHEEZING OR SHORTNESS OF Percy Bracken, MD  Active Self           Med Note Hali Leventhal   Sat Oct 20, 2023 12:46 PM) PRN  esomeprazole  (NEXIUM ) 40 MG capsule [id number] Yes Take 1 capsule (40 mg total) by mouth daily. Dellar Fenton, MD  Active Self   fluticasone -salmeterol (ADVAIR) 250-50 MCG/ACT AEPB [id number] Yes Inhale 1 puff into the lungs in the morning and at bedtime. Marc Senior, MD  Active Self  hydrochlorothiazide  (HYDRODIURIL ) 25 MG tablet [id number] Yes Take 25 mg by mouth daily. [provider]  Active   losartan  (COZAAR ) 25 MG tablet [id number] Yes TAKE 1 TABLET(25 MG) BY MOUTH DAILY Dellar Fenton, MD  Active Self  rosuvastatin  (CRESTOR ) 10 MG tablet [id number] Yes Take 1 tablet (10 mg total) by mouth daily. Dellar Fenton, MD  Active Self  sertraline  (ZOLOFT ) 25 MG tablet [id number] Yes Take 1 1/2 tablet per day. Dellar Fenton, MD  Active Self            Home Care and Equipment/Supplies: Were Home Health Services Ordered?: NA Any new equipment or medical supplies ordered?: NA  Functional Questionnaire: Do you need assistance with bathing/showering or dressing?: No Do you need assistance with meal preparation?: No Do you need assistance with eating?: No Do you have difficulty maintaining continence: No Do you need assistance with getting out of bed/getting out of a chair/moving?: No Do you have difficulty managing or taking your medications?: No  Follow up appointments reviewed: PCP Follow-up appointment confirmed?: Yes Date of PCP follow-up appointment?: 11/01/23 Follow-up Provider: Maybell Spates Follow-up appointment confirmed?: Yes Date of Specialist follow-up appointment?: 11/14/23 Follow-Up Specialty Provider:: Diego Pabon Do you need transportation to your follow-up appointment?: No Do you understand care options if your condition(s) worsen?: Yes-patient verbalized understanding  SDOH Interventions Today    Flowsheet Row Most Recent Value  SDOH Interventions  Food Insecurity Interventions Intervention Not Indicated  Housing Interventions Intervention Not Indicated  Transportation Interventions Intervention Not Indicated  Utilities Interventions Intervention Not  Indicated    Gareld June, BSN, RN [place]  VBCI - Population Health RN Care Manager [phone]

## 2023-10-26 ENCOUNTER — Ambulatory Visit: Admitting: Internal Medicine

## 2023-11-01 ENCOUNTER — Inpatient Hospital Stay: Admitting: Family

## 2023-11-07 ENCOUNTER — Ambulatory Visit: Admitting: Internal Medicine

## 2023-11-07 VITALS — BP 128/72 | HR 86 | Temp 98.0°F | Resp 16 | Ht 67.0 in | Wt 222.2 lb

## 2023-11-07 DIAGNOSIS — E7800 Pure hypercholesterolemia, unspecified: Secondary | ICD-10-CM

## 2023-11-07 DIAGNOSIS — R739 Hyperglycemia, unspecified: Secondary | ICD-10-CM

## 2023-11-07 DIAGNOSIS — Z8719 Personal history of other diseases of the digestive system: Secondary | ICD-10-CM | POA: Diagnosis not present

## 2023-11-07 DIAGNOSIS — D649 Anemia, unspecified: Secondary | ICD-10-CM | POA: Diagnosis not present

## 2023-11-07 DIAGNOSIS — F320 Major depressive disorder, single episode, mild: Secondary | ICD-10-CM | POA: Diagnosis not present

## 2023-11-07 DIAGNOSIS — I10 Essential (primary) hypertension: Secondary | ICD-10-CM | POA: Diagnosis not present

## 2023-11-07 DIAGNOSIS — K219 Gastro-esophageal reflux disease without esophagitis: Secondary | ICD-10-CM

## 2023-11-07 DIAGNOSIS — E538 Deficiency of other specified B group vitamins: Secondary | ICD-10-CM

## 2023-11-07 DIAGNOSIS — I700 Atherosclerosis of aorta: Secondary | ICD-10-CM | POA: Diagnosis not present

## 2023-11-07 LAB — CBC WITH DIFFERENTIAL/PLATELET
Basophils Absolute: 0 10*3/uL (ref 0.0–0.1)
Basophils Relative: 0.4 % (ref 0.0–3.0)
Eosinophils Absolute: 0.2 10*3/uL (ref 0.0–0.7)
Eosinophils Relative: 2.9 % (ref 0.0–5.0)
HCT: 36.8 % (ref 36.0–46.0)
Hemoglobin: 12.5 g/dL (ref 12.0–15.0)
Lymphocytes Relative: 31.7 % (ref 12.0–46.0)
Lymphs Abs: 2.1 10*3/uL (ref 0.7–4.0)
MCHC: 33.9 g/dL (ref 30.0–36.0)
MCV: 89.5 fl (ref 78.0–100.0)
Monocytes Absolute: 0.6 10*3/uL (ref 0.1–1.0)
Monocytes Relative: 8.3 % (ref 3.0–12.0)
Neutro Abs: 3.8 10*3/uL (ref 1.4–7.7)
Neutrophils Relative %: 56.7 % (ref 43.0–77.0)
Platelets: 265 10*3/uL (ref 150.0–400.0)
RBC: 4.11 Mil/uL (ref 3.87–5.11)
RDW: 13.8 % (ref 11.5–15.5)
WBC: 6.7 10*3/uL (ref 4.0–10.5)

## 2023-11-07 LAB — BASIC METABOLIC PANEL WITH GFR
BUN: 14 mg/dL (ref 6–23)
CO2: 29 meq/L (ref 19–32)
Calcium: 10 mg/dL (ref 8.4–10.5)
Chloride: 105 meq/L (ref 96–112)
Creatinine, Ser: 1.09 mg/dL (ref 0.40–1.20)
GFR: 48.95 mL/min — ABNORMAL LOW (ref >60.00)
Glucose, Bld: 128 mg/dL — ABNORMAL HIGH (ref 70–99)
Potassium: 4.7 meq/L (ref 3.5–5.1)
Sodium: 141 meq/L (ref 135–145)

## 2023-11-07 LAB — IBC + FERRITIN
Ferritin: 18.4 ng/mL (ref 10.0–291.0)
Iron: 116 ug/dL (ref 42–145)
Saturation Ratios: 36 % (ref 20.0–50.0)
TIBC: 322 ug/dL (ref 250.0–450.0)
Transferrin: 230 mg/dL (ref 212.0–360.0)

## 2023-11-07 LAB — VITAMIN B12: Vitamin B-12: 279 pg/mL (ref 211–911)

## 2023-11-07 NOTE — Progress Notes (Signed)
 Subjective:    [name], female    DOB: 12-Jul-1945, 78 y.o.   MRN: [id number]  Patient here for  Chief Complaint  Patient presents with   Medical Management of Chronic Issues    HPI Here for hospital follow up. Admitted 10/20/23 - 10/24/23 after presenting with abdominal pain and constipation. Found to have SBO. General surgery consulted. NG tube placed. Managed with bowel rest and conservative therapy. Hydrochlorothiazide  was held. Previously saw pulmonary 10/03/23 - f/u cough variant asthma. Recommended to continue advair. Since her discharge, she reports feeling better. No increased cough or congestion. Eating. No nausea or vomiting. Bowels moving. Handling stress. Remains off hydrochlorothiazide     Past Medical History:  Diagnosis Date   Allergy    Depression    History of chicken pox    HLD (hyperlipidemia)    Hx: UTI (urinary tract infection)    Hypertension    Sleep apnea    Past Surgical History:  Procedure Laterality Date   COLONOSCOPY WITH PROPOFOL  N/A 06/20/2016   Procedure: COLONOSCOPY WITH PROPOFOL ;  Surgeon: Rogelia Copping, MD;  Location: [facility] ENDOSCOPY;  Service: Endoscopy;  Laterality: N/A;   COLONOSCOPY WITH PROPOFOL  N/A 02/14/2022   Procedure: COLONOSCOPY WITH PROPOFOL ;  Surgeon: Copping Rogelia, MD;  Location[facility] ENDOSCOPY;  Service: Endoscopy;  Laterality: N/A;   ESOPHAGOGASTRODUODENOSCOPY N/A 02/14/2022   Procedure: ESOPHAGOGASTRODUODENOSCOPY (EGD);  Surgeon: Copping Rogelia, MD;  Location: [facility] ENDOSCOPY;  Service: Endoscopy;  Laterality: N/A;   ESOPHAGOGASTRODUODENOSCOPY (EGD) WITH PROPOFOL  N/A 06/20/2016   Procedure: ESOPHAGOGASTRODUODENOSCOPY (EGD) WITH PROPOFOL ;  Surgeon: Rogelia Copping, MD;  Location: [facility] ENDOSCOPY;  Service: Endoscopy;  Laterality: N/A;   TONSILLECTOMY     as a child   TUMOR REMOVAL     behind ear   Family History  Problem Relation Age of Onset   Hypertension Mother    Cancer Sister        colon cancer   Hypertension  Sister    Diabetes Sister    Hypertension Brother    Hypertension Maternal Aunt    Breast cancer Maternal Aunt 60   Hypertension Maternal Uncle    Stroke Maternal Grandmother    Hypertension Maternal Grandmother    Social History   Socioeconomic History   Marital status: Divorced    Spouse name: Not on file   Number of children: Not on file   Years of education: Not on file   Highest education level: Associate degree: occupational, Scientist, product/process development, or vocational program  Occupational History   Not on file  Tobacco Use   Smoking status: Never   Smokeless tobacco: Never  Vaping Use   Vaping status: Never Used  Substance and Sexual Activity   Alcohol use: No    Alcohol/week: 0.0 standard drinks of alcohol   Drug use: No   Sexual activity: Not Currently  Other Topics Concern   Not on file  Social History Narrative   Not on file   Social Drivers of Health   Financial Resource Strain: Low Risk  (04/21/2023)   Overall Financial Resource Strain (CARDIA)    Difficulty of Paying Living Expenses: Not very hard  Food Insecurity: No Food Insecurity (10/25/2023)   Hunger Vital Sign    Worried About Running Out of Food in the Last Year: Never true    Ran Out of Food in the Last Year: Never true  Transportation Needs: No Transportation Needs (10/25/2023)   PRAPARE - Administrator, Civil Service (Medical): No  Lack of Transportation (Non-Medical): No  Physical Activity: Unknown (04/21/2023)   Exercise Vital Sign    Days of Exercise per Week: 0 days    Minutes of Exercise per Session: Not on file  Recent Concern: Physical Activity - Inactive (04/21/2023)   Exercise Vital Sign    Days of Exercise per Week: 0 days    Minutes of Exercise per Session: 0 min  Stress: No Stress Concern Present (04/21/2023)   [employer] of Occupational Health - Occupational Stress Questionnaire    Feeling of Stress : Only a little  Social Connections: Socially Isolated (10/20/2023)    Social Connection and Isolation Panel    Frequency of Communication with Friends and Family: More than three times a week    Frequency of Social Gatherings with Friends and Family: Once a week    Attends Religious Services: Never    Database administrator or Organizations: No    Attends Banker Meetings: Never    Marital Status: Divorced     Review of Systems  Constitutional:  Negative for appetite change and unexpected weight change.  HENT:  Negative for congestion and sinus pressure.   Respiratory:  Negative for cough, chest tightness and shortness of breath.   Cardiovascular:  Negative for chest pain, palpitations and leg swelling.  Gastrointestinal:  Negative for abdominal pain, diarrhea, nausea and vomiting.  Genitourinary:  Negative for difficulty urinating and dysuria.  Musculoskeletal:  Negative for joint swelling and myalgias.  Skin:  Negative for color change and rash.  Neurological:  Negative for dizziness and headaches.  Psychiatric/Behavioral:  Negative for agitation and dysphoric mood.        Objective:     BP 128/72   Pulse 86   Temp 98 F (36.7 C)   Resp 16   Ht 5' 7 (1.702 m)   Wt 222 lb 3.2 oz (100.8 kg)   SpO2 98%   BMI 34.80 kg/m  Wt Readings from Last 3 Encounters:  11/07/23 222 lb 3.2 oz (100.8 kg)  10/25/23 226 lb (102.5 kg)  10/20/23 226 lb 6.6 oz (102.7 kg)    Physical Exam Vitals reviewed.  Constitutional:      General: She is not in acute distress.    Appearance: Normal appearance.  HENT:     Head: Normocephalic and atraumatic.     Right Ear: External ear normal.     Left Ear: External ear normal.     Mouth/Throat:     Pharynx: No oropharyngeal exudate or posterior oropharyngeal erythema.  Eyes:     General: No scleral icterus.       Right eye: No discharge.        Left eye: No discharge.     Conjunctiva/sclera: Conjunctivae normal.  Neck:     Thyroid : No thyromegaly.  Cardiovascular:     Rate and Rhythm: Normal  rate and regular rhythm.  Pulmonary:     Effort: No respiratory distress.     Breath sounds: Normal breath sounds. No wheezing.  Abdominal:     General: Bowel sounds are normal.     Palpations: Abdomen is soft.     Tenderness: There is no abdominal tenderness.  Musculoskeletal:        General: No swelling or tenderness.     Cervical back: Neck supple. No tenderness.  Lymphadenopathy:     Cervical: No cervical adenopathy.  Skin:    Findings: No erythema or rash.  Neurological:     Mental Status: She  is alert.  Psychiatric:        Mood and Affect: Mood normal.        Behavior: Behavior normal.         Outpatient Encounter Medications as of 11/07/2023  Medication Sig   albuterol  (PROVENTIL ) (2.5 MG/3ML) 0.083% nebulizer solution Take 3 mLs (2.5 mg total) by nebulization every 4 (four) hours as needed for wheezing or shortness of breath.   albuterol  (VENTOLIN  HFA) 108 (90 Base) MCG/ACT inhaler INHALE 2 PUFFS INTO THE LUNGS EVERY 6 HOURS AS NEEDED FOR WHEEZING OR SHORTNESS OF BREATH   esomeprazole  (NEXIUM ) 40 MG capsule Take 1 capsule (40 mg total) by mouth daily.   fluticasone -salmeterol (ADVAIR) 250-50 MCG/ACT AEPB Inhale 1 puff into the lungs in the morning and at bedtime.   hydrochlorothiazide  (HYDRODIURIL ) 25 MG tablet Take 25 mg by mouth daily.   losartan  (COZAAR ) 25 MG tablet TAKE 1 TABLET(25 MG) BY MOUTH DAILY   rosuvastatin  (CRESTOR ) 10 MG tablet Take 1 tablet (10 mg total) by mouth daily.   sertraline  (ZOLOFT ) 25 MG tablet Take 1 1/2 tablet per day.   No facility-administered encounter medications on file as of 11/07/2023.     Lab Results  Component Value Date   WBC 6.7 11/07/2023   HGB 12.5 11/07/2023   HCT 36.8 11/07/2023   PLT 265.0 11/07/2023   GLUCOSE 128 (H) 11/07/2023   CHOL 114 08/28/2023   TRIG 85.0 08/28/2023   HDL 45.60 08/28/2023   LDLCALC 51 08/28/2023   ALT 18 10/20/2023   AST 25 10/20/2023   NA 141 11/07/2023   K 4.7 11/07/2023   CL 105  11/07/2023   CREATININE 1.09 11/07/2023   BUN 14 11/07/2023   CO2 29 11/07/2023   TSH 1.72 04/23/2023   HGBA1C 6.4 08/28/2023    DG ABD ACUTE 2+V W 1V CHEST Result Date: 10/21/2023 CLINICAL DATA:  SBO EXAM: DG ABDOMEN ACUTE WITH 1 VIEW CHEST COMPARISON:  10/20/2023. FINDINGS: There is no evidence of dilated bowel loops or free intraperitoneal air. NG tube tip in the midline upper abdomen below the diaphragm. No radiopaque calculi or other significant radiographic abnormality is seen. Heart size and mediastinal contours are within normal limits. Both lungs are clear. IMPRESSION: Negative abdominal radiographs.  No acute cardiopulmonary disease. Electronically Signed   By: Fonda Field M.D.   On: 10/21/2023 10:42   DG Abd Portable 1 View Result Date: 10/20/2023 CLINICAL DATA:  Enteric tube placement. EXAM: PORTABLE ABDOMEN - 1 VIEW COMPARISON:  Abdomen 02/12/2005 and chest x-ray 04/13/2023 FINDINGS: Enteric tube is present with tip over the stomach in the left upper quadrant just below the expected region of the gastroesophageal junction. Side port appears just above the expected region of the gastroesophageal junction as this could be advanced proximally 5 cm. Mild prominence of the infrahilar vessels which may be due to mild vascular congestion. Upper abdomen demonstrates nonobstructive bowel gas pattern. No free peritoneal air. IMPRESSION: Enteric tube with tip over the stomach in the left upper quadrant just below the expected region of the gastroesophageal junction. Side port appears just above the expected region of the gastroesophageal junction as this could be advanced proximally 5 cm. Electronically Signed   By: Toribio Agreste M.D.   On: 10/20/2023 12:51   CT ABDOMEN PELVIS WO CONTRAST Result Date: 10/20/2023 CLINICAL DATA:  Acute abdominal pain. EXAM: CT ABDOMEN AND PELVIS WITHOUT CONTRAST TECHNIQUE: Multidetector CT imaging of the abdomen and pelvis was performed following the standard  protocol  without IV contrast. RADIATION DOSE REDUCTION: This exam was performed according to the departmental dose-optimization program which includes automated exposure control, adjustment of the mA and/or kV according to patient size and/or use of iterative reconstruction technique. COMPARISON:  08/27/2007 FINDINGS: Lower chest: No acute findings. Hepatobiliary: No mass visualized on this unenhanced exam. A few small hepatic cysts are again noted. A few tiny gallstones are seen, but without signs of cholecystitis or biliary ductal dilatation. Pancreas: No mass or inflammatory process visualized on this unenhanced exam. Spleen:  Within normal limits in size. Adrenals/Urinary tract: No evidence of urolithiasis or hydronephrosis. Unremarkable unopacified urinary bladder. Stomach/Bowel: Moderate hiatal hernia is seen. Moderately dilated fluid-filled small bowel loops are seen with transition point seen distally in the right lower quadrant, suspicious for adhesion. No mass or focal inflammatory process identified. Tiny amount of perihepatic and pelvic ascites noted. Vascular/Lymphatic: No pathologically enlarged lymph nodes identified. No evidence of abdominal aortic aneurysm. Reproductive:  No mass or other significant abnormality. Other:  None. Musculoskeletal:  No suspicious bone lesions identified. IMPRESSION: Distal small bowel obstruction, with transition point in right lower quadrant suspicious for adhesion. Tiny amount of ascites. Moderate hiatal hernia. Cholelithiasis. No radiographic evidence of cholecystitis. Electronically Signed   By: Norleen DELENA Kil M.D.   On: 10/20/2023 10:57       Assessment & Plan:  Essential hypertension, benign Assessment & Plan: Continues on losartan  and hydrochlorothiazide . Continue current medication regimen. No changes in medication.follow pressures.   Orders: -     Basic metabolic panel with GFR  B12 deficiency Assessment & Plan: Recheck B12 level.   Orders: -      Vitamin B12  Anemia, unspecified type Assessment & Plan: Hgb noted to be decreased during hospitalization. Recheck cbc today.   Orders: -     CBC with Differential/Platelet -     IBC + Ferritin  Aortic atherosclerosis (HCC) Assessment & Plan: Continue crestor .    Gastroesophageal reflux disease, unspecified whether esophagitis present Assessment & Plan: No upper symptoms reported. Nexium .    Hypercholesteremia Assessment & Plan: Continue crestor .  Low cholesterol diet and exercise.  Follow lipid panel and liver function tests. No changes in medication.  Lab Results  Component Value Date   CHOL 114 08/28/2023   HDL 45.60 08/28/2023   LDLCALC 51 08/28/2023   TRIG 85.0 08/28/2023   CHOLHDL 3 08/28/2023      Hyperglycemia Assessment & Plan: Low carb diet and exercise. Follow met b and A1c.  Lab Results  Component Value Date   HGBA1C 6.4 08/28/2023      Major depressive disorder, single episode, mild (HCC) Assessment & Plan: Increased stress/anxiety as outlined. On zoloft  - low dose - 25mg  q day. Increase to 1 1/2 tablet q day. Oveall appears to be doing well. Follow.    History of small bowel obstruction Assessment & Plan: Recently hospitalized. S/p NGT placement. Since discharge doing better. Eating. No abdominal pain. Eating. Reviewed labs - during hospitalization. Found to have low hgb. Will check cbc, B12 and iron  studies. Also recheck metabolic panel.       Allena Hamilton, MD

## 2023-11-08 ENCOUNTER — Ambulatory Visit: Payer: Self-pay | Admitting: Internal Medicine

## 2023-11-10 ENCOUNTER — Encounter: Payer: Self-pay | Admitting: Internal Medicine

## 2023-11-10 DIAGNOSIS — Z8719 Personal history of other diseases of the digestive system: Secondary | ICD-10-CM | POA: Insufficient documentation

## 2023-11-10 NOTE — Assessment & Plan Note (Signed)
 Hgb noted to be decreased during hospitalization. Recheck cbc today.

## 2023-11-10 NOTE — Assessment & Plan Note (Signed)
 Recently hospitalized. S/p NGT placement. Since discharge doing better. Eating. No abdominal pain. Eating. Reviewed labs - during hospitalization. Found to have low hgb. Will check cbc, B12 and iron  studies. Also recheck metabolic panel.

## 2023-11-10 NOTE — Assessment & Plan Note (Signed)
 Continue crestor

## 2023-11-10 NOTE — Assessment & Plan Note (Signed)
 Continue crestor .  Low cholesterol diet and exercise.  Follow lipid panel and liver function tests. No changes in medication.  Lab Results  Component Value Date   CHOL 114 08/28/2023   HDL 45.60 08/28/2023   LDLCALC 51 08/28/2023   TRIG 85.0 08/28/2023   CHOLHDL 3 08/28/2023

## 2023-11-10 NOTE — Assessment & Plan Note (Signed)
 Low carb diet and exercise.  Follow met b and A1c.  Lab Results  Component Value Date   HGBA1C 6.4 08/28/2023

## 2023-11-10 NOTE — Assessment & Plan Note (Signed)
No upper symptoms reported.  Nexium.  ?

## 2023-11-10 NOTE — Assessment & Plan Note (Signed)
Recheck B12 level 

## 2023-11-10 NOTE — Assessment & Plan Note (Signed)
 Increased stress/anxiety as outlined. On zoloft  - low dose - 25mg  q day. Increase to 1 1/2 tablet q day. Oveall appears to be doing well. Follow.

## 2023-11-10 NOTE — Assessment & Plan Note (Signed)
 Continues on losartan  and hydrochlorothiazide . Continue current medication regimen. No changes in medication.follow pressures.

## 2023-11-12 ENCOUNTER — Ambulatory Visit (INDEPENDENT_AMBULATORY_CARE_PROVIDER_SITE_OTHER)

## 2023-11-12 DIAGNOSIS — E538 Deficiency of other specified B group vitamins: Secondary | ICD-10-CM | POA: Diagnosis not present

## 2023-11-12 MED ORDER — CYANOCOBALAMIN 1000 MCG/ML IJ SOLN
1000.0000 ug | Freq: Once | INTRAMUSCULAR | Status: AC
  Administered 2023-11-12: 1000 ug via INTRAMUSCULAR

## 2023-11-12 NOTE — Progress Notes (Signed)
 Patient is in office today for a nurse visit for B12 Injection. Patient Injection was given in the  Left deltoid. Patient tolerated injection well.

## 2023-11-14 ENCOUNTER — Ambulatory Visit (INDEPENDENT_AMBULATORY_CARE_PROVIDER_SITE_OTHER): Admitting: Surgery

## 2023-11-14 ENCOUNTER — Encounter: Payer: Self-pay | Admitting: Surgery

## 2023-11-14 VITALS — BP 167/77 | HR 83 | Temp 98.5°F | Ht 67.0 in | Wt 223.6 lb

## 2023-11-14 DIAGNOSIS — K56609 Unspecified intestinal obstruction, unspecified as to partial versus complete obstruction: Secondary | ICD-10-CM | POA: Diagnosis not present

## 2023-11-14 NOTE — Patient Instructions (Signed)
Small Bowel Series  A small bowel series is an X-ray test. It is used to check for problems in the small bowel. The small bowel is also called the small intestine. For this test, you will drink a liquid called barium. The liquid (contrast liquid) shows up well on X-rays. This makes it easier for your doctor to see any problems. The test can help to find out why you have symptoms such as: Belly (abdominal) pain. Bloating. Watery poop (diarrhea). Tell your doctor about: Any allergies you have, especially allergies to liquids used in imaging tests. All medicines you are taking. This includes vitamins, herbs, eye drops, creams, and over-the-counter medicines. Any blood disorders you have. Any surgeries you have had. Any medical conditions you have. Whether you are pregnant or may be pregnant. Whether you are breastfeeding. What are the risks? Usually, this is a safe test. However, problems may happen, such as: Feeling like you may vomit (nauseous) after drinking the contrast liquid. Cramps. Trouble pooping (constipation). A blockage in your small bowel getting worse. Exposure to a very small amount of radiation. Allergic reaction to the contrast liquid. This is rare. What happens before the test? Follow instructions from your doctor about what you cannot eat or drink. Ask your doctor about changing or stopping: Your normal medicines. Vitamins, herbs, and supplements. Over-the-counter medicines. What happens during the test? You will drink the contrast liquid. It looks like a milkshake. Using a type of X-ray, the doctor will watch the contrast liquid as it moves through: The part of your body that moves food from your mouth to your stomach (esophagus). Your stomach. Your small bowel. Plain X-ray pictures will be taken often as the contrast liquid moves through your small bowel. These may be taken every 15-60 minutes. You may need to change positions often so the doctor can see all of  the small bowel. You may need to stand up or lie on a table. The table may move or tilt. You may need to turn from side to side. The procedure may vary among doctors and hospitals. What can I expect after the test? Your poop (stool) may be white or gray for 2-3 days until all the contrast liquid has passed out of your body in your poop. It is up to you to get the results of your test. Ask how to get your results when they are ready. Talk with your doctor about what your results mean. Follow these instructions at home:  Return to your normal diet as told by your doctor. Return to your normal activities when your doctor says that it is safe. Check your poop to make sure that it returns to normal color within a few days. If told, take actions to prevent trouble pooping and to help get the contrast liquid out of your body. You may need to: Drink enough fluid to keep your pee (urine) pale yellow. Take medicines. You will be told what medicines to take. Eat foods that are high in fiber. These include beans, whole grains, and fresh fruits and vegetables. Limit foods that are high in fat and sugar. These include fried or sweet foods. Contact a doctor if: You have trouble pooping for more than 2 days. Your poop still looks white or chalky after 3 days. You have cramps or pain. You have watery poop. You have swelling of your belly. You feel like you may vomit or you vomit. You have a fever. Get help right away if: You are not able to  poop. You cannot pass gas. You have belly pain that gets worse. You have itchy, red, swollen areas of skin (hives). Your throat swells. You have trouble breathing. You have a very hard and bloated (distended) belly. These symptoms may be an emergency. Get help right away. Call your local emergency services (911 in the [place] Do not wait to see if the symptoms will go away. Do not drive yourself to the hospital. Summary A small bowel series is an X-ray test.  It is used to check for problems in the small bowel. For this test, you will drink a contrast liquid called barium. The liquid shows up well on X-rays and makes it easier for your doctor to see problems. After the test, your poop may be white or gray for 2-3 days until all the contrast liquid has passed out of your body. This information is not intended to replace advice given to you by your health care provider. Make sure you discuss any questions you have with your health care provider. Document Revised: 01/05/2021 Document Reviewed: 06/07/2020 Elsevier Patient Education  2024 [employer].

## 2023-11-15 NOTE — Progress Notes (Signed)
 Outpatient Surgical Follow Up  11/15/2023  Susan Miller is an 78 y.o. female.   Chief Complaint  Patient presents with   New Patient (Initial Visit)    SBO    HPI: Susan Miller is a 78 year old female with a recent admission for small bowel obstruction.  Now she does have a virgin abdomen.  She did have significant bowel obstruction that resolved.  She did have a CT scan that I personally reviewed showing no evidence of a clear-cut reason for the bowel obstruction.  No evidence of masses.  Now that she is doing much better she is tolerating diet having bowel movements flatus.  No abdominal pain  Past Medical History:  Diagnosis Date   Allergy    Depression    History of chicken pox    HLD (hyperlipidemia)    Hx: UTI (urinary tract infection)    Hypertension    Sleep apnea     Past Surgical History:  Procedure Laterality Date   COLONOSCOPY WITH PROPOFOL  N/A 06/20/2016   Procedure: COLONOSCOPY WITH PROPOFOL ;  Surgeon: Rogelia Copping, MD;  Location: [facility] ENDOSCOPY;  Service: Endoscopy;  Laterality: N/A;   COLONOSCOPY WITH PROPOFOL  N/A 02/14/2022   Procedure: COLONOSCOPY WITH PROPOFOL ;  Surgeon: Copping Rogelia, MD;  Location: [facility] ENDOSCOPY;  Service: Endoscopy;  Laterality: N/A;   ESOPHAGOGASTRODUODENOSCOPY N/A 02/14/2022   Procedure: ESOPHAGOGASTRODUODENOSCOPY (EGD);  Surgeon: Copping Rogelia, MD;  Location: [facility] ENDOSCOPY;  Service: Endoscopy;  Laterality: N/A;   ESOPHAGOGASTRODUODENOSCOPY (EGD) WITH PROPOFOL  N/A 06/20/2016   Procedure: ESOPHAGOGASTRODUODENOSCOPY (EGD) WITH PROPOFOL ;  Surgeon: Rogelia Copping, MD;  Location: [facility] ENDOSCOPY;  Service: Endoscopy;  Laterality: N/A;   TONSILLECTOMY     as a child   TUMOR REMOVAL     behind ear    Family History  Problem Relation Age of Onset   Hypertension Mother    Cancer Sister        colon cancer   Hypertension Sister    Diabetes Sister    Hypertension Brother    Hypertension Maternal Aunt    Breast cancer Maternal Aunt 60    Hypertension Maternal Uncle    Stroke Maternal Grandmother    Hypertension Maternal Grandmother     Social History:  reports that she has never smoked. She has never used smokeless tobacco. She reports that she does not drink alcohol and does not use drugs.  Allergies:  Allergies  Allergen Reactions   Codeine Hives   Iodinated Contrast Media Swelling    Medications reviewed.    ROS Full ROS performed and is otherwise negative other than what is stated in HPI   BP (!) 167/77   Pulse 83   Temp 98.5 F (36.9 C) (Oral)   Ht 5' 7 (1.702 m)   Wt 223 lb 9.6 oz (101.4 kg)   SpO2 97%   BMI 35.02 kg/m   Physical Exam Vitals and nursing note reviewed. Exam conducted with a chaperone present.  Constitutional:      General: She is not in acute distress.    Appearance: Normal appearance. She is not ill-appearing.  Cardiovascular:     Rate and Rhythm: Normal rate and regular rhythm.     Heart sounds: No murmur heard. Pulmonary:     Effort: Pulmonary effort is normal. No respiratory distress.     Breath sounds: Normal breath sounds. No stridor. No wheezing or rhonchi.  Abdominal:     General: Abdomen is flat. There is no distension.     Palpations: Abdomen is soft.  There is no mass.     Tenderness: There is no abdominal tenderness. There is no guarding or rebound.     Hernia: No hernia is present.  Musculoskeletal:     Cervical back: Normal range of motion and neck supple. No rigidity.  Skin:    General: Skin is warm.     Capillary Refill: Capillary refill takes less than 2 seconds.  Neurological:     General: No focal deficit present.     Mental Status: She is alert and oriented to person, place, and time.  Psychiatric:        Mood and Affect: Mood normal.        Behavior: Behavior normal.        Thought Content: Thought content normal.        Judgment: Judgment normal.   Assessment/Plan: 78 year old female with recent history of small bowel obstruction managed  conservatively.  I have personally the images and I did not find any definitive evidence of mechanical process.  Also she does have a paraesophageal hernia that has symptoms.  I advised her about potential options of continuation with medication versus potential repair.  She wishes to think about it.  She will call us  back if she has got any issues.   I personally spent a total of 30 minutes in the care of the patient today including performing a medically appropriate exam/evaluation, counseling and educating, placing orders, referring and communicating with other health care professionals, documenting clinical information in the EHR, independently interpreting and reviewing images studies and coordinating care.   Laneta Luna, MD [facility] General Surgeon

## 2023-11-19 ENCOUNTER — Ambulatory Visit (INDEPENDENT_AMBULATORY_CARE_PROVIDER_SITE_OTHER)

## 2023-11-19 DIAGNOSIS — E538 Deficiency of other specified B group vitamins: Secondary | ICD-10-CM | POA: Diagnosis not present

## 2023-11-19 MED ORDER — CYANOCOBALAMIN 1000 MCG/ML IJ SOLN
1000.0000 ug | Freq: Once | INTRAMUSCULAR | Status: AC
  Administered 2023-11-19: 1000 ug via INTRAMUSCULAR

## 2023-11-19 NOTE — Progress Notes (Signed)
 Pt presented for their vitamin B12 injection. Pt was identified through two identifiers. Pt tolerated shot well in their right deltoid.

## 2023-11-26 ENCOUNTER — Ambulatory Visit (INDEPENDENT_AMBULATORY_CARE_PROVIDER_SITE_OTHER)

## 2023-11-26 DIAGNOSIS — E538 Deficiency of other specified B group vitamins: Secondary | ICD-10-CM | POA: Diagnosis not present

## 2023-11-26 MED ORDER — CYANOCOBALAMIN 1000 MCG/ML IJ SOLN
1000.0000 ug | Freq: Once | INTRAMUSCULAR | Status: AC
  Administered 2023-11-26: 1000 ug via INTRAMUSCULAR

## 2023-11-26 NOTE — Progress Notes (Signed)
 Pt presented for their vitamin B12 injection. Pt was identified through two identifiers. Pt tolerated shot well in their left deltoid.

## 2023-12-03 ENCOUNTER — Ambulatory Visit (INDEPENDENT_AMBULATORY_CARE_PROVIDER_SITE_OTHER)

## 2023-12-03 DIAGNOSIS — E538 Deficiency of other specified B group vitamins: Secondary | ICD-10-CM

## 2023-12-03 MED ORDER — CYANOCOBALAMIN 1000 MCG/ML IJ SOLN
1000.0000 ug | Freq: Once | INTRAMUSCULAR | Status: AC
  Administered 2023-12-03: 1000 ug via INTRAMUSCULAR

## 2023-12-03 NOTE — Progress Notes (Unsigned)
 Pt presented for their vitamin B12 injection. Pt was identified through two identifiers. Pt tolerated shot well in their right deltoid.

## 2023-12-04 NOTE — Procedures (Signed)
 The patient is in the process of obtaining a CPAP device for home use. The patient was fitted with a Resmed Nuance Pro Gel with medium pillows. The patient tolerated the trial well. The patient stated that she cannot wear a full face mask due to PTSD. The patient was informed that if she developed oral breathing while on PAP, that a chin strap would be considered.The trial mask was given to the patient for home use.

## 2023-12-07 ENCOUNTER — Telehealth: Payer: Self-pay | Admitting: Internal Medicine

## 2023-12-07 NOTE — Telephone Encounter (Signed)
 Lm for patient to call office to reschedule 12/19/2023 appointment. Also sent a MyChart message.

## 2023-12-17 ENCOUNTER — Other Ambulatory Visit

## 2023-12-17 ENCOUNTER — Telehealth: Payer: Self-pay | Admitting: Internal Medicine

## 2023-12-17 NOTE — Telephone Encounter (Signed)
 I received notification that she is overdue a mammogram. Needs to schedule. See if agreeable to schedule.

## 2023-12-17 NOTE — Telephone Encounter (Signed)
 PT declined the Mammogram imaging; PT no longer wants to continue mammorgrams

## 2023-12-19 ENCOUNTER — Ambulatory Visit: Admitting: Internal Medicine

## 2024-01-16 ENCOUNTER — Ambulatory Visit (INDEPENDENT_AMBULATORY_CARE_PROVIDER_SITE_OTHER): Admitting: *Deleted

## 2024-01-16 VITALS — Ht 67.0 in | Wt 228.0 lb

## 2024-01-16 DIAGNOSIS — Z0000 Encounter for general adult medical examination without abnormal findings: Secondary | ICD-10-CM

## 2024-01-16 NOTE — Patient Instructions (Signed)
 Susan Miller,  Thank you for taking the time for your Medicare Wellness Visit. I appreciate your continued commitment to your health goals. Please review the care plan we discussed, and feel free to reach out if I can assist you further.  Medicare recommends these wellness visits once per year to help you and your care team stay ahead of potential health issues. These visits are designed to focus on prevention, allowing your provider to concentrate on managing your acute and chronic conditions during your regular appointments.  Please note that Annual Wellness Visits do not include a physical exam. Some assessments may be limited, especially if the visit was conducted virtually. If needed, we may recommend a separate in-person follow-up with your provider.  Ongoing Care Seeing your primary care provider every 3 to 6 months helps us  monitor your health and provide consistent, personalized care.  Remember to update your flu, tetanus (Tdap) and shingles vaccines.   Referrals If a referral was made during today's visit and you haven't received any updates within two weeks, please contact the referred provider directly to check on the status.  Recommended Screenings:  Health Maintenance  Topic Date Due   DTaP/Tdap/Td vaccine (1 - Tdap) Never done   Zoster (Shingles) Vaccine (1 of 2) Never done   Flu Shot  12/07/2023   Medicare Annual Wellness Visit  01/15/2025   Pneumococcal Vaccine for age over 22  Completed   DEXA scan (bone density measurement)  Completed   Hepatitis C Screening  Completed   HPV Vaccine  Aged Out   Meningitis B Vaccine  Aged Out   Colon Cancer Screening  Discontinued   COVID-19 Vaccine  Discontinued       01/16/2024   10:36 AM  Advanced Directives  Does Patient Have a Medical Advance Directive? No  Would patient like information on creating a medical advance directive? No - Patient declined   Advance Care Planning is important because it: Ensures you receive  medical care that aligns with your values, goals, and preferences. Provides guidance to your family and loved ones, reducing the emotional burden of decision-making during critical moments.  Vision: Annual vision screenings are recommended for early detection of glaucoma, cataracts, and diabetic retinopathy. These exams can also reveal signs of chronic conditions such as diabetes and high blood pressure.  Dental: Annual dental screenings help detect early signs of oral cancer, gum disease, and other conditions linked to overall health, including heart disease and diabetes.  Please see the attached documents for additional preventive care recommendations.

## 2024-01-16 NOTE — Progress Notes (Signed)
 Subjective:   Susan Miller is a 78 y.o. who presents for a Medicare Wellness preventive visit.  As a reminder, Annual Wellness Visits don't include a physical exam, and some assessments may be limited, especially if this visit is performed virtually. We may recommend an in-person follow-up visit with your provider if needed.  Visit Complete: Virtual I connected with  [name] on 01/16/24 by a audio enabled telemedicine application and verified that I am speaking with the correct person using two identifiers.  Patient Location: Home  Provider Location: Home Office  I discussed the limitations of evaluation and management by telemedicine. The patient expressed understanding and agreed to proceed.  Vital Signs: Because this visit was a virtual/telehealth visit, some criteria may be missing or patient reported. Any vitals not documented were not able to be obtained and vitals that have been documented are patient reported.  VideoDeclined- This patient declined Librarian, academic. Therefore the visit was completed with audio only.  Persons Participating in Visit: Patient.  AWV Questionnaire: No: Patient Medicare AWV questionnaire was not completed prior to this visit.  Cardiac Risk Factors include: advanced age (>52men, >20 women);dyslipidemia;hypertension;obesity (BMI >30kg/m2)     Objective:    Today's Vitals   01/16/24 1013  Weight: 228 lb (103.4 kg)  Height: 5' 7 (1.702 m)   Body mass index is 35.71 kg/m.     01/16/2024   10:36 AM 10/20/2023    1:38 PM 10/20/2023    8:41 AM 03/22/2023   10:27 AM 03/09/2023    9:38 AM 09/05/2022    1:52 PM 06/26/2022    9:14 AM  Advanced Directives  Does Patient Have a Medical Advance Directive? No No No No No No No  Would patient like information on creating a medical advance directive? No - Patient declined No - Patient declined No - Patient declined No - Patient declined   No - Patient declined     Current Medications (verified) Outpatient Encounter Medications as of 01/16/2024  Medication Sig   albuterol  (PROVENTIL ) (2.5 MG/3ML) 0.083% nebulizer solution Take 3 mLs (2.5 mg total) by nebulization every 4 (four) hours as needed for wheezing or shortness of breath.   albuterol  (VENTOLIN  HFA) 108 (90 Base) MCG/ACT inhaler INHALE 2 PUFFS INTO THE LUNGS EVERY 6 HOURS AS NEEDED FOR WHEEZING OR SHORTNESS OF BREATH   esomeprazole  (NEXIUM ) 40 MG capsule Take 1 capsule (40 mg total) by mouth daily.   fluticasone -salmeterol (ADVAIR) 250-50 MCG/ACT AEPB Inhale 1 puff into the lungs in the morning and at bedtime.   losartan  (COZAAR ) 25 MG tablet TAKE 1 TABLET(25 MG) BY MOUTH DAILY   rosuvastatin  (CRESTOR ) 10 MG tablet Take 1 tablet (10 mg total) by mouth daily.   sertraline  (ZOLOFT ) 25 MG tablet Take 1 1/2 tablet per day.   VITAMIN D -VITAMIN K PO Take by mouth. (Patient taking differently: Take by mouth. Chew two gummies daily)   hydrochlorothiazide  (HYDRODIURIL ) 25 MG tablet Take 25 mg by mouth daily. (Patient not taking: Reported on 01/16/2024)   No facility-administered encounter medications on file as of 01/16/2024.    Allergies (verified) Codeine and Iodinated contrast media   History: Past Medical History:  Diagnosis Date   Allergy    Depression    History of chicken pox    HLD (hyperlipidemia)    Hx: UTI (urinary tract infection)    Hypertension    Sleep apnea    Past Surgical History:  Procedure Laterality Date   COLONOSCOPY  WITH PROPOFOL  N/A 06/20/2016   Procedure: COLONOSCOPY WITH PROPOFOL ;  Surgeon: Rogelia Copping, MD;  Location: [facility] ENDOSCOPY;  Service: Endoscopy;  Laterality: N/A;   COLONOSCOPY WITH PROPOFOL  N/A 02/14/2022   Procedure: COLONOSCOPY WITH PROPOFOL ;  Surgeon: Copping Rogelia, MD;  Location: [facility] ENDOSCOPY;  Service: Endoscopy;  Laterality: N/A;   ESOPHAGOGASTRODUODENOSCOPY N/A 02/14/2022   Procedure: ESOPHAGOGASTRODUODENOSCOPY (EGD);  Surgeon: Copping Rogelia, MD;   Location: [facility] ENDOSCOPY;  Service: Endoscopy;  Laterality: N/A;   ESOPHAGOGASTRODUODENOSCOPY (EGD) WITH PROPOFOL  N/A 06/20/2016   Procedure: ESOPHAGOGASTRODUODENOSCOPY (EGD) WITH PROPOFOL ;  Surgeon: Rogelia Copping, MD;  Location: [facility] ENDOSCOPY;  Service: Endoscopy;  Laterality: N/A;   TONSILLECTOMY     as a child   TUMOR REMOVAL     behind ear   Family History  Problem Relation Age of Onset   Hypertension Mother    Cancer Sister        colon cancer   Hypertension Sister    Diabetes Sister    Hypertension Brother    Hypertension Maternal Aunt    Breast cancer Maternal Aunt 60   Hypertension Maternal Uncle    Stroke Maternal Grandmother    Hypertension Maternal Grandmother    Social History   Socioeconomic History   Marital status: Divorced    Spouse name: Not on file   Number of children: Not on file   Years of education: Not on file   Highest education level: Associate degree: occupational, Scientist, product/process development, or vocational program  Occupational History   Not on file  Tobacco Use   Smoking status: Never   Smokeless tobacco: Never  Vaping Use   Vaping status: Never Used  Substance and Sexual Activity   Alcohol use: No    Alcohol/week: 0.0 standard drinks of alcohol   Drug use: No   Sexual activity: Not Currently  Other Topics Concern   Not on file  Social History Narrative   Not on file   Social Drivers of Health   Financial Resource Strain: Low Risk  (01/16/2024)   Overall Financial Resource Strain (CARDIA)    Difficulty of Paying Living Expenses: Not hard at all  Food Insecurity: No Food Insecurity (01/16/2024)   Hunger Vital Sign    Worried About Running Out of Food in the Last Year: Never true    Ran Out of Food in the Last Year: Never true  Transportation Needs: No Transportation Needs (01/16/2024)   PRAPARE - Administrator, Civil Service (Medical): No    Lack of Transportation (Non-Medical): No  Physical Activity: Inactive (01/16/2024)   Exercise Vital  Sign    Days of Exercise per Week: 0 days    Minutes of Exercise per Session: 0 min  Stress: Stress Concern Present (01/16/2024)   [employer] of Occupational Health - Occupational Stress Questionnaire    Feeling of Stress: To some extent  Social Connections: Moderately Isolated (01/16/2024)   Social Connection and Isolation Panel    Frequency of Communication with Friends and Family: More than three times a week    Frequency of Social Gatherings with Friends and Family: Once a week    Attends Religious Services: Never    Database administrator or Organizations: Yes    Attends Banker Meetings: Never    Marital Status: Divorced    Tobacco Counseling Counseling given: Not Answered    Clinical Intake:  Pre-visit preparation completed: Yes  Pain : No/denies pain     BMI - recorded: 35.71 Nutritional Status: BMI >  30  Obese Nutritional Risks: None Diabetes: No  Lab Results  Component Value Date   HGBA1C 6.4 08/28/2023   HGBA1C 6.8 (H) 04/23/2023   HGBA1C 6.4 12/15/2022     How often do you need to have someone help you when you read instructions, pamphlets, or other written materials from your doctor or pharmacy?: 1 - Never  Interpreter Needed?: No  Information entered by :: R. Latica Hohmann LPN   Activities of Daily Living     01/16/2024   10:15 AM 10/20/2023    2:52 PM  In your present state of health, do you have any difficulty performing the following activities:  Hearing? 0 0  Vision? 0 0  Difficulty concentrating or making decisions? 0 0  Walking or climbing stairs? 1   Dressing or bathing? 0   Doing errands, shopping? 0   Preparing Food and eating ? N   Using the Toilet? N   In the past six months, have you accidently leaked urine? N   Do you have problems with loss of bowel control? N   Managing your Medications? N   Managing your Finances? N   Housekeeping or managing your Housekeeping? N     Patient Care Team: Glendia Shad, MD as  PCP - General (Internal Medicine) Darliss Rogue, MD as PCP - Cardiology (Cardiology) Geronimo Manuelita SAUNDERS, [facility] as Pharmacist (Pharmacist) Malachy Comer GAILS, NP as Nurse Practitioner (Nurse Practitioner) Tamea Dedra CROME, MD as Consulting Physician (Pulmonary Disease)  I have updated your Care Teams any recent Medical Services you may have received from other providers in the past year.     Assessment:   This is a routine wellness examination for [place].  Hearing/Vision screen Hearing Screening - Comments:: No issues Vision Screening - Comments:: readers   Goals Addressed             This Visit's Progress    Patient Stated       Wants to be more active        Depression Screen     01/16/2024   10:26 AM 08/31/2023   10:55 AM 04/25/2023    8:30 AM 12/22/2022    9:46 AM 08/17/2022   11:44 AM 06/26/2022    8:59 AM 03/29/2022   11:11 AM  PHQ 2/9 Scores  PHQ - 2 Score 1 2 2 2 2 2  0  PHQ- 9 Score 7 6 7 9 7 3      Fall Risk     01/16/2024   10:18 AM 06/05/2023    9:01 AM 04/25/2023    8:30 AM 08/17/2022   11:44 AM 06/25/2022   12:29 PM  Fall Risk   Falls in the past year? 0 0 0 0 0  Number falls in past yr: 0  0 1 0  Injury with Fall? 0  0 0 0  Risk for fall due to : No Fall Risks  No Fall Risks No Fall Risks   Follow up Falls evaluation completed;Falls prevention discussed  Falls evaluation completed Falls evaluation completed Falls evaluation completed;Falls prevention discussed    MEDICARE RISK AT HOME:  Medicare Risk at Home Any stairs in or around the home?: Yes If so, are there any without handrails?: No Home free of loose throw rugs in walkways, pet beds, electrical cords, etc?: Yes Adequate lighting in your home to reduce risk of falls?: Yes Life alert?: No Use of a cane, walker or w/c?: No Grab bars in the bathroom?: Yes Shower chair or  bench in shower?: No Elevated toilet seat or a handicapped toilet?: Yes  TIMED UP AND GO:  Was the test performed?   No  Cognitive Function: 6CIT completed    12/06/2017    2:20 PM 12/07/2016   10:37 AM  MMSE - Mini Mental State Exam  Orientation to time 5 5   Orientation to Place 5 5   Registration 3 3   Attention/ Calculation 5 5   Recall 3 3   Language- name 2 objects 2 2   Language- repeat 1 1  Language- follow 3 step command 3 3   Language- read & follow direction 1 1   Write a sentence 1 1   Copy design 1 1   Total score 30 30      Data saved with a previous flowsheet row definition        01/16/2024   10:36 AM 06/26/2022    9:06 AM 05/14/2020    1:50 PM 05/14/2019    9:30 AM  6CIT Screen  What Year? 0 points 0 points 0 points 0 points  What month? 0 points 0 points 0 points 0 points  What time? 0 points 0 points 0 points 0 points  Count back from 20 0 points 0 points 0 points 0 points  Months in reverse 0 points 0 points 0 points 0 points  Repeat phrase 0 points 0 points 0 points 0 points  Total Score 0 points 0 points 0 points 0 points    Immunizations Immunization History  Administered Date(s) Administered   Fluad Quad(high Dose 65+) 01/31/2019, 01/29/2020, 01/25/2022   INFLUENZA, HIGH DOSE SEASONAL PF 01/11/2016, 04/27/2017, 01/18/2018   Influenza,inj,Quad PF,6+ Mos 02/24/2013, 02/27/2014, 02/22/2015   PFIZER(Purple Top)SARS-COV-2 Vaccination 06/23/2019, 07/14/2019, 01/06/2022   Pneumococcal Conjugate-13 11/06/2016   Pneumococcal Polysaccharide-23 12/06/2017   RSV,unspecified 01/06/2022    Screening Tests Health Maintenance  Topic Date Due   DTaP/Tdap/Td (1 - Tdap) Never done   Zoster Vaccines- Shingrix (1 of 2) Never done   Medicare Annual Wellness (AWV)  06/27/2023   Influenza Vaccine  12/07/2023   Pneumococcal Vaccine: 50+ Years  Completed   DEXA SCAN  Completed   Hepatitis C Screening  Completed   HPV VACCINES  Aged Out   Meningococcal B Vaccine  Aged Out   Colonoscopy  Discontinued   COVID-19 Vaccine  Discontinued    Health Maintenance Items  Addressed: Discussed the need to update flu, tetanus (Tdap) and shingles vaccines. Patient declines Dexa at this time wants to talk with PCP about it.   Additional Screening:  Vision Screening: Recommended annual ophthalmology exams for early detection of glaucoma and other disorders of the eye. Is the patient up to date with their annual eye exam?  Yes  Who is the provider or what is the name of the office in which the patient attends annual eye exams? [facility]  Dental Screening: Recommended annual dental exams for proper oral hygiene  Community Resource Referral / Chronic Care Management: CRR required this visit?  No   CCM required this visit?  No   Plan:    I have personally reviewed and noted the following in the patient's chart:   Medical and social history Use of alcohol, tobacco or illicit drugs  Current medications and supplements including opioid prescriptions. Patient is not currently taking opioid prescriptions. Functional ability and status Nutritional status Physical activity Advanced directives List of other physicians Hospitalizations, surgeries, and ER visits in previous 12 months Vitals Screenings to  include cognitive, depression, and falls Referrals and appointments  In addition, I have reviewed and discussed with patient certain preventive protocols, quality metrics, and best practice recommendations. A written personalized care plan for preventive services as well as general preventive health recommendations were provided to patient.   Angeline Fredericks, LPN   [id number]   After Visit Summary: (MyChart) Due to this being a telephonic visit, the after visit summary with patients personalized plan was offered to patient via MyChart   Notes: Nothing significant to report at this time.

## 2024-01-24 ENCOUNTER — Other Ambulatory Visit

## 2024-01-24 ENCOUNTER — Other Ambulatory Visit (INDEPENDENT_AMBULATORY_CARE_PROVIDER_SITE_OTHER)

## 2024-01-24 DIAGNOSIS — R739 Hyperglycemia, unspecified: Secondary | ICD-10-CM | POA: Diagnosis not present

## 2024-01-24 DIAGNOSIS — E7800 Pure hypercholesterolemia, unspecified: Secondary | ICD-10-CM

## 2024-01-24 DIAGNOSIS — D509 Iron deficiency anemia, unspecified: Secondary | ICD-10-CM

## 2024-01-24 LAB — CBC WITH DIFFERENTIAL/PLATELET
Basophils Absolute: 0 K/uL (ref 0.0–0.1)
Basophils Relative: 0.1 % (ref 0.0–3.0)
Eosinophils Absolute: 0.2 K/uL (ref 0.0–0.7)
Eosinophils Relative: 2.3 % (ref 0.0–5.0)
HCT: 35.3 % — ABNORMAL LOW (ref 36.0–46.0)
Hemoglobin: 11.6 g/dL — ABNORMAL LOW (ref 12.0–15.0)
Lymphocytes Relative: 26.4 % (ref 12.0–46.0)
Lymphs Abs: 1.8 K/uL (ref 0.7–4.0)
MCHC: 32.8 g/dL (ref 30.0–36.0)
MCV: 89.6 fl (ref 78.0–100.0)
Monocytes Absolute: 0.6 K/uL (ref 0.1–1.0)
Monocytes Relative: 8.8 % (ref 3.0–12.0)
Neutro Abs: 4.3 K/uL (ref 1.4–7.7)
Neutrophils Relative %: 62.4 % (ref 43.0–77.0)
Platelets: 266 K/uL (ref 150.0–400.0)
RBC: 3.94 Mil/uL (ref 3.87–5.11)
RDW: 13.4 % (ref 11.5–15.5)
WBC: 7 K/uL (ref 4.0–10.5)

## 2024-01-24 LAB — BASIC METABOLIC PANEL WITH GFR
BUN: 12 mg/dL (ref 6–23)
CO2: 30 meq/L (ref 19–32)
Calcium: 9.6 mg/dL (ref 8.4–10.5)
Chloride: 105 meq/L (ref 96–112)
Creatinine, Ser: 1 mg/dL (ref 0.40–1.20)
GFR: 54.2 mL/min — ABNORMAL LOW (ref >60.00)
Glucose, Bld: 106 mg/dL — ABNORMAL HIGH (ref 70–99)
Potassium: 4.5 meq/L (ref 3.5–5.1)
Sodium: 141 meq/L (ref 135–145)

## 2024-01-24 LAB — HEPATIC FUNCTION PANEL
ALT: 11 U/L (ref 0–35)
AST: 14 U/L (ref 0–37)
Albumin: 4.2 g/dL (ref 3.5–5.2)
Alkaline Phosphatase: 78 U/L (ref 39–117)
Bilirubin, Direct: 0.1 mg/dL (ref 0.0–0.3)
Total Bilirubin: 0.4 mg/dL (ref 0.2–1.2)
Total Protein: 6.5 g/dL (ref 6.0–8.3)

## 2024-01-24 LAB — LIPID PANEL
Cholesterol: 117 mg/dL (ref 0–200)
HDL: 45.2 mg/dL (ref >39.00)
LDL Cholesterol: 55 mg/dL (ref 0–99)
NonHDL: 71.32
Total CHOL/HDL Ratio: 3
Triglycerides: 81 mg/dL (ref 0.0–149.0)
VLDL: 16.2 mg/dL (ref 0.0–40.0)

## 2024-01-24 LAB — IBC + FERRITIN
Ferritin: 20.8 ng/mL (ref 10.0–291.0)
Iron: 28 ug/dL — ABNORMAL LOW (ref 42–145)
Saturation Ratios: 7.9 % — ABNORMAL LOW (ref 20.0–50.0)
TIBC: 352.8 ug/dL (ref 250.0–450.0)
Transferrin: 252 mg/dL (ref 212.0–360.0)

## 2024-01-24 LAB — HEMOGLOBIN A1C: Hgb A1c MFr Bld: 6.6 % — ABNORMAL HIGH (ref 4.6–6.5)

## 2024-01-25 ENCOUNTER — Ambulatory Visit: Payer: Self-pay | Admitting: Internal Medicine

## 2024-01-28 ENCOUNTER — Ambulatory Visit: Admitting: Internal Medicine

## 2024-01-28 VITALS — BP 132/74 | HR 81 | Resp 16 | Ht 67.0 in | Wt 224.0 lb

## 2024-01-28 DIAGNOSIS — E7800 Pure hypercholesterolemia, unspecified: Secondary | ICD-10-CM

## 2024-01-28 DIAGNOSIS — G4733 Obstructive sleep apnea (adult) (pediatric): Secondary | ICD-10-CM

## 2024-01-28 DIAGNOSIS — E559 Vitamin D deficiency, unspecified: Secondary | ICD-10-CM | POA: Diagnosis not present

## 2024-01-28 DIAGNOSIS — E538 Deficiency of other specified B group vitamins: Secondary | ICD-10-CM

## 2024-01-28 DIAGNOSIS — I10 Essential (primary) hypertension: Secondary | ICD-10-CM

## 2024-01-28 DIAGNOSIS — D649 Anemia, unspecified: Secondary | ICD-10-CM | POA: Diagnosis not present

## 2024-01-28 DIAGNOSIS — I700 Atherosclerosis of aorta: Secondary | ICD-10-CM

## 2024-01-28 DIAGNOSIS — Z23 Encounter for immunization: Secondary | ICD-10-CM

## 2024-01-28 DIAGNOSIS — R739 Hyperglycemia, unspecified: Secondary | ICD-10-CM

## 2024-01-28 DIAGNOSIS — K219 Gastro-esophageal reflux disease without esophagitis: Secondary | ICD-10-CM | POA: Diagnosis not present

## 2024-01-28 NOTE — Progress Notes (Signed)
 Subjective:    [name], female    DOB: [birth year], 78 y.o.   MRN: [id number]  Patient here for scheduled follow up.   HPI Here for a scheduled follow up - follow up regarding hypertension, depression and hypercholesterolemia. Admitted 10/2023 with SBO. See previous note for details. Continues on losartan  and crestor . Had f/u with surgery 11/14/23 - stable. Reports some decreased appetite. She is eating/snacking. No abdominal pain reported. No chest pain or sob reported. Hydrochlorothiazide  - held during hospitalization. Has not restarted. Has noticed some rightness - ankles and legs. Would like to restart.    Past Medical History:  Diagnosis Date   Allergy    Depression    History of chicken pox    HLD (hyperlipidemia)    Hx: UTI (urinary tract infection)    Hypertension    Sleep apnea    Past Surgical History:  Procedure Laterality Date   COLONOSCOPY WITH PROPOFOL  N/A 06/20/2016   Procedure: COLONOSCOPY WITH PROPOFOL ;  Surgeon: Rogelia Copping, MD;  Location: [facility] ENDOSCOPY;  Service: Endoscopy;  Laterality: N/A;   COLONOSCOPY WITH PROPOFOL  N/A 02/14/2022   Procedure: COLONOSCOPY WITH PROPOFOL ;  Surgeon: Copping Rogelia, MD;  Location: [facility] ENDOSCOPY;  Service: Endoscopy;  Laterality: N/A;   ESOPHAGOGASTRODUODENOSCOPY N/A 02/14/2022   Procedure: ESOPHAGOGASTRODUODENOSCOPY (EGD);  Surgeon: Copping Rogelia, MD;  Location: [facility] ENDOSCOPY;  Service: Endoscopy;  Laterality: N/A;   ESOPHAGOGASTRODUODENOSCOPY (EGD) WITH PROPOFOL  N/A 06/20/2016   Procedure: ESOPHAGOGASTRODUODENOSCOPY (EGD) WITH PROPOFOL ;  Surgeon: Rogelia Copping, MD;  Location: [facility] ENDOSCOPY;  Service: Endoscopy;  Laterality: N/A;   TONSILLECTOMY     as a child   TUMOR REMOVAL     behind ear   Family History  Problem Relation Age of Onset   Hypertension Mother    Cancer Sister        colon cancer   Hypertension Sister    Diabetes Sister    Hypertension Brother    Hypertension Maternal Aunt    Breast cancer  Maternal Aunt 60   Hypertension Maternal Uncle    Stroke Maternal Grandmother    Hypertension Maternal Grandmother    Social History   Socioeconomic History   Marital status: Divorced    Spouse name: Not on file   Number of children: Not on file   Years of education: Not on file   Highest education level: Associate degree: occupational, Scientist, product/process development, or vocational program  Occupational History   Not on file  Tobacco Use   Smoking status: Never   Smokeless tobacco: Never  Vaping Use   Vaping status: Never Used  Substance and Sexual Activity   Alcohol use: No    Alcohol/week: 0.0 standard drinks of alcohol   Drug use: No   Sexual activity: Not Currently  Other Topics Concern   Not on file  Social History Narrative   Not on file   Social Drivers of Health   Financial Resource Strain: Medium Risk (01/24/2024)   Overall Financial Resource Strain (CARDIA)    Difficulty of Paying Living Expenses: Somewhat hard  Food Insecurity: No Food Insecurity (01/24/2024)   Hunger Vital Sign    Worried About Running Out of Food in the Last Year: Never true    Ran Out of Food in the Last Year: Never true  Transportation Needs: No Transportation Needs (01/24/2024)   PRAPARE - Administrator, Civil Service (Medical): No    Lack of Transportation (Non-Medical): No  Physical Activity: Inactive (01/24/2024)   Exercise Vital  Sign    Days of Exercise per Week: 0 days    Minutes of Exercise per Session: Not on file  Stress: Stress Concern Present (01/24/2024)   [employer] of Occupational Health - Occupational Stress Questionnaire    Feeling of Stress: Rather much  Social Connections: Socially Isolated (01/24/2024)   Social Connection and Isolation Panel    Frequency of Communication with Friends and Family: More than three times a week    Frequency of Social Gatherings with Friends and Family: Once a week    Attends Religious Services: Never    Database administrator or  Organizations: No    Attends Engineer, structural: Not on file    Marital Status: Divorced     Review of Systems  Constitutional:  Negative for appetite change and unexpected weight change.  HENT:  Negative for sore throat.        No increased sinus pressure.   Respiratory:  Negative for cough, chest tightness and shortness of breath.   Cardiovascular:  Negative for chest pain and palpitations.       Reports ankles and legs - occasionally tight.   Gastrointestinal:  Negative for abdominal pain, nausea and vomiting.  Genitourinary:  Negative for difficulty urinating and dysuria.  Musculoskeletal:  Negative for joint swelling and myalgias.  Skin:  Negative for color change and rash.  Neurological:  Negative for dizziness and headaches.  Psychiatric/Behavioral:  Negative for agitation and dysphoric mood.        Objective:     BP 132/74   Pulse 81   Resp 16   Ht 5' 7 (1.702 m)   Wt 224 lb (101.6 kg)   SpO2 98%   BMI 35.08 kg/m  Wt Readings from Last 3 Encounters:  01/28/24 224 lb (101.6 kg)  01/16/24 228 lb (103.4 kg)  11/14/23 223 lb 9.6 oz (101.4 kg)    Physical Exam Vitals reviewed.  Constitutional:      General: She is not in acute distress.    Appearance: Normal appearance.  HENT:     Head: Normocephalic and atraumatic.     Right Ear: External ear normal.     Left Ear: External ear normal.     Mouth/Throat:     Pharynx: No oropharyngeal exudate or posterior oropharyngeal erythema.  Eyes:     General: No scleral icterus.       Right eye: No discharge.        Left eye: No discharge.     Conjunctiva/sclera: Conjunctivae normal.  Neck:     Thyroid : No thyromegaly.  Cardiovascular:     Rate and Rhythm: Normal rate and regular rhythm.  Pulmonary:     Effort: No respiratory distress.     Breath sounds: Normal breath sounds. No wheezing.  Abdominal:     General: Bowel sounds are normal.     Palpations: Abdomen is soft.     Tenderness: There is no  abdominal tenderness.  Musculoskeletal:        General: No tenderness.     Cervical back: Neck supple. No tenderness.     Comments: No increased swelling.   Lymphadenopathy:     Cervical: No cervical adenopathy.  Skin:    Findings: No erythema or rash.  Neurological:     Mental Status: She is alert.  Psychiatric:        Mood and Affect: Mood normal.        Behavior: Behavior normal.  Outpatient Encounter Medications as of 01/28/2024  Medication Sig   hydrochlorothiazide  (HYDRODIURIL ) 25 MG tablet Take 25 mg by mouth daily. (Patient taking differently: Take 12.5 mg by mouth daily.)   albuterol  (PROVENTIL ) (2.5 MG/3ML) 0.083% nebulizer solution Take 3 mLs (2.5 mg total) by nebulization every 4 (four) hours as needed for wheezing or shortness of breath.   albuterol  (VENTOLIN  HFA) 108 (90 Base) MCG/ACT inhaler INHALE 2 PUFFS INTO THE LUNGS EVERY 6 HOURS AS NEEDED FOR WHEEZING OR SHORTNESS OF BREATH   esomeprazole  (NEXIUM ) 40 MG capsule Take 1 capsule (40 mg total) by mouth daily.   fluticasone -salmeterol (ADVAIR) 250-50 MCG/ACT AEPB Inhale 1 puff into the lungs in the morning and at bedtime.   losartan  (COZAAR ) 25 MG tablet TAKE 1 TABLET(25 MG) BY MOUTH DAILY   rosuvastatin  (CRESTOR ) 10 MG tablet Take 1 tablet (10 mg total) by mouth daily.   sertraline  (ZOLOFT ) 25 MG tablet Take 1 1/2 tablet per day.   VITAMIN D -VITAMIN K PO Take by mouth. (Patient taking differently: Take by mouth. Chew two gummies daily)   No facility-administered encounter medications on file as of 01/28/2024.     Lab Results  Component Value Date   WBC 7.0 01/24/2024   HGB 11.6 (L) 01/24/2024   HCT 35.3 (L) 01/24/2024   PLT 266.0 01/24/2024   GLUCOSE 106 (H) 01/24/2024   CHOL 117 01/24/2024   TRIG 81.0 01/24/2024   HDL 45.20 01/24/2024   LDLCALC 55 01/24/2024   ALT 11 01/24/2024   AST 14 01/24/2024   NA 141 01/24/2024   K 4.5 01/24/2024   CL 105 01/24/2024   CREATININE 1.00 01/24/2024   BUN 12  01/24/2024   CO2 30 01/24/2024   TSH 1.72 04/23/2023   HGBA1C 6.6 (H) 01/24/2024    DG ABD ACUTE 2+V W 1V CHEST Result Date: 10/21/2023 CLINICAL DATA:  SBO EXAM: DG ABDOMEN ACUTE WITH 1 VIEW CHEST COMPARISON:  10/20/2023. FINDINGS: There is no evidence of dilated bowel loops or free intraperitoneal air. NG tube tip in the midline upper abdomen below the diaphragm. No radiopaque calculi or other significant radiographic abnormality is seen. Heart size and mediastinal contours are within normal limits. Both lungs are clear. IMPRESSION: Negative abdominal radiographs.  No acute cardiopulmonary disease. Electronically Signed   By: Fonda Field M.D.   On: 10/21/2023 10:42   DG Abd Portable 1 View Result Date: 10/20/2023 CLINICAL DATA:  Enteric tube placement. EXAM: PORTABLE ABDOMEN - 1 VIEW COMPARISON:  Abdomen 02/12/2005 and chest x-ray 04/13/2023 FINDINGS: Enteric tube is present with tip over the stomach in the left upper quadrant just below the expected region of the gastroesophageal junction. Side port appears just above the expected region of the gastroesophageal junction as this could be advanced proximally 5 cm. Mild prominence of the infrahilar vessels which may be due to mild vascular congestion. Upper abdomen demonstrates nonobstructive bowel gas pattern. No free peritoneal air. IMPRESSION: Enteric tube with tip over the stomach in the left upper quadrant just below the expected region of the gastroesophageal junction. Side port appears just above the expected region of the gastroesophageal junction as this could be advanced proximally 5 cm. Electronically Signed   By: Toribio Agreste M.D.   On: 10/20/2023 12:51   CT ABDOMEN PELVIS WO CONTRAST Result Date: 10/20/2023 CLINICAL DATA:  Acute abdominal pain. EXAM: CT ABDOMEN AND PELVIS WITHOUT CONTRAST TECHNIQUE: Multidetector CT imaging of the abdomen and pelvis was performed following the standard protocol without IV contrast. RADIATION  DOSE  REDUCTION: This exam was performed according to the departmental dose-optimization program which includes automated exposure control, adjustment of the mA and/or kV according to patient size and/or use of iterative reconstruction technique. COMPARISON:  08/27/2007 FINDINGS: Lower chest: No acute findings. Hepatobiliary: No mass visualized on this unenhanced exam. A few small hepatic cysts are again noted. A few tiny gallstones are seen, but without signs of cholecystitis or biliary ductal dilatation. Pancreas: No mass or inflammatory process visualized on this unenhanced exam. Spleen:  Within normal limits in size. Adrenals/Urinary tract: No evidence of urolithiasis or hydronephrosis. Unremarkable unopacified urinary bladder. Stomach/Bowel: Moderate hiatal hernia is seen. Moderately dilated fluid-filled small bowel loops are seen with transition point seen distally in the right lower quadrant, suspicious for adhesion. No mass or focal inflammatory process identified. Tiny amount of perihepatic and pelvic ascites noted. Vascular/Lymphatic: No pathologically enlarged lymph nodes identified. No evidence of abdominal aortic aneurysm. Reproductive:  No mass or other significant abnormality. Other:  None. Musculoskeletal:  No suspicious bone lesions identified. IMPRESSION: Distal small bowel obstruction, with transition point in right lower quadrant suspicious for adhesion. Tiny amount of ascites. Moderate hiatal hernia. Cholelithiasis. No radiographic evidence of cholecystitis. Electronically Signed   By: Norleen DELENA Kil M.D.   On: 10/20/2023 10:57       Assessment & Plan:  Immunization due -     Flu vaccine HIGH DOSE PF(Fluzone Trivalent)  Anemia, unspecified type Assessment & Plan: 01/24/24 - hgb 11.6. will need to follow. Plan for recheck at her next appt. Consider checking iron  studies and b12.    Aortic atherosclerosis Assessment & Plan: Continue crestor .    B12 deficiency Assessment & Plan: Consider  checking with next f/u lab as outlined.    Essential hypertension, benign Assessment & Plan: Off hydrochlorothiazide . Would like to restart. Blood pressure as outlined. Restart hydrochlorothiazide  12.5mg  q day. Follow pressures. Follow metabolic panel.    Gastroesophageal reflux disease, unspecified whether esophagitis present Assessment & Plan: No upper symptoms reported. Some decreased appetite. Snacking. Continue nexium . Low carb diet. Follow.    Hypercholesteremia Assessment & Plan: Continue crestor .  Low cholesterol diet and exercise.  Follow lipid panel and liver function tests. No change in medication.  Lab Results  Component Value Date   CHOL 117 01/24/2024   HDL 45.20 01/24/2024   LDLCALC 55 01/24/2024   TRIG 81.0 01/24/2024   CHOLHDL 3 01/24/2024      Hyperglycemia Assessment & Plan: Low carb diet and exercise. Follow met b and A1c.  Lab Results  Component Value Date   HGBA1C 6.6 (H) 01/24/2024      OSA (obstructive sleep apnea) Assessment & Plan: Has a history of mild sleep apnea. Intolerant to cpap. Saw pulmonary. Wanted to hold on repeat sleep study. Diet and exercise. Follow.    Vitamin D  deficiency Assessment & Plan: Follow vitamin D  level.       Allena Hamilton, MD

## 2024-01-28 NOTE — Patient Instructions (Signed)
 Start hydrochlorothiazide  - 25mg  - take 1/2 tablet per day.

## 2024-02-03 ENCOUNTER — Encounter: Payer: Self-pay | Admitting: Internal Medicine

## 2024-02-03 NOTE — Assessment & Plan Note (Signed)
 Continue crestor .  Low cholesterol diet and exercise.  Follow lipid panel and liver function tests. No change in medication.  Lab Results  Component Value Date   CHOL 117 01/24/2024   HDL 45.20 01/24/2024   LDLCALC 55 01/24/2024   TRIG 81.0 01/24/2024   CHOLHDL 3 01/24/2024

## 2024-02-03 NOTE — Assessment & Plan Note (Signed)
Follow vitamin D level.  

## 2024-02-03 NOTE — Assessment & Plan Note (Signed)
 Off hydrochlorothiazide . Would like to restart. Blood pressure as outlined. Restart hydrochlorothiazide  12.5mg  q day. Follow pressures. Follow metabolic panel.

## 2024-02-03 NOTE — Assessment & Plan Note (Signed)
 Consider checking with next f/u lab as outlined.

## 2024-02-03 NOTE — Assessment & Plan Note (Signed)
 01/24/24 - hgb 11.6. will need to follow. Plan for recheck at her next appt. Consider checking iron  studies and b12.

## 2024-02-03 NOTE — Assessment & Plan Note (Signed)
 Continue crestor

## 2024-02-03 NOTE — Assessment & Plan Note (Signed)
 Has a history of mild sleep apnea. Intolerant to cpap. Saw pulmonary. Wanted to hold on repeat sleep study. Diet and exercise. Follow.

## 2024-02-03 NOTE — Assessment & Plan Note (Signed)
 Low carb diet and exercise. Follow met b and A1c.  Lab Results  Component Value Date   HGBA1C 6.6 (H) 01/24/2024

## 2024-02-03 NOTE — Assessment & Plan Note (Signed)
 No upper symptoms reported. Some decreased appetite. Snacking. Continue nexium . Low carb diet. Follow.

## 2024-02-04 ENCOUNTER — Ambulatory Visit: Admitting: Internal Medicine

## 2024-03-12 ENCOUNTER — Other Ambulatory Visit: Payer: Self-pay | Admitting: Pulmonary Disease

## 2024-03-12 DIAGNOSIS — J45991 Cough variant asthma: Secondary | ICD-10-CM

## 2024-03-12 DIAGNOSIS — R053 Chronic cough: Secondary | ICD-10-CM

## 2024-03-20 ENCOUNTER — Telehealth: Payer: Self-pay | Admitting: Internal Medicine

## 2024-03-20 NOTE — Telephone Encounter (Signed)
 Lm and sent MyChart message that 03/24/2024 appointment has been canceled, needs to be rescheduled.

## 2024-03-21 ENCOUNTER — Encounter: Payer: Self-pay | Admitting: Internal Medicine

## 2024-03-21 NOTE — Telephone Encounter (Signed)
 Noted

## 2024-03-21 NOTE — Telephone Encounter (Signed)
 Given she is having issues with swelling, please schedule her for next week.

## 2024-03-24 ENCOUNTER — Ambulatory Visit: Admitting: Internal Medicine

## 2024-03-24 DIAGNOSIS — E7800 Pure hypercholesterolemia, unspecified: Secondary | ICD-10-CM

## 2024-03-24 DIAGNOSIS — R739 Hyperglycemia, unspecified: Secondary | ICD-10-CM

## 2024-03-24 DIAGNOSIS — D509 Iron deficiency anemia, unspecified: Secondary | ICD-10-CM

## 2024-03-25 ENCOUNTER — Encounter: Payer: Self-pay | Admitting: Internal Medicine

## 2024-03-25 ENCOUNTER — Ambulatory Visit: Admitting: Internal Medicine

## 2024-03-25 VITALS — BP 120/64 | HR 78 | Temp 97.6°F | Ht 67.0 in | Wt 223.8 lb

## 2024-03-25 DIAGNOSIS — D649 Anemia, unspecified: Secondary | ICD-10-CM

## 2024-03-25 DIAGNOSIS — K2100 Gastro-esophageal reflux disease with esophagitis, without bleeding: Secondary | ICD-10-CM

## 2024-03-25 DIAGNOSIS — J45991 Cough variant asthma: Secondary | ICD-10-CM | POA: Diagnosis not present

## 2024-03-25 DIAGNOSIS — D509 Iron deficiency anemia, unspecified: Secondary | ICD-10-CM | POA: Diagnosis not present

## 2024-03-25 DIAGNOSIS — R739 Hyperglycemia, unspecified: Secondary | ICD-10-CM | POA: Diagnosis not present

## 2024-03-25 DIAGNOSIS — E7800 Pure hypercholesterolemia, unspecified: Secondary | ICD-10-CM

## 2024-03-25 DIAGNOSIS — I10 Essential (primary) hypertension: Secondary | ICD-10-CM | POA: Diagnosis not present

## 2024-03-25 LAB — CBC WITH DIFFERENTIAL/PLATELET
Basophils Absolute: 0 K/uL (ref 0.0–0.1)
Basophils Relative: 0.3 % (ref 0.0–3.0)
Eosinophils Absolute: 0.4 K/uL (ref 0.0–0.7)
Eosinophils Relative: 5.4 % — ABNORMAL HIGH (ref 0.0–5.0)
HCT: 33.7 % — ABNORMAL LOW (ref 36.0–46.0)
Hemoglobin: 11.5 g/dL — ABNORMAL LOW (ref 12.0–15.0)
Lymphocytes Relative: 27.4 % (ref 12.0–46.0)
Lymphs Abs: 2.2 K/uL (ref 0.7–4.0)
MCHC: 34.2 g/dL (ref 30.0–36.0)
MCV: 88.5 fl (ref 78.0–100.0)
Monocytes Absolute: 0.7 K/uL (ref 0.1–1.0)
Monocytes Relative: 8.4 % (ref 3.0–12.0)
Neutro Abs: 4.6 K/uL (ref 1.4–7.7)
Neutrophils Relative %: 58.5 % (ref 43.0–77.0)
Platelets: 311 K/uL (ref 150.0–400.0)
RBC: 3.81 Mil/uL — ABNORMAL LOW (ref 3.87–5.11)
RDW: 14.2 % (ref 11.5–15.5)
WBC: 7.9 K/uL (ref 4.0–10.5)

## 2024-03-25 LAB — BASIC METABOLIC PANEL WITH GFR
BUN: 15 mg/dL (ref 6–23)
CO2: 28 meq/L (ref 19–32)
Calcium: 9.6 mg/dL (ref 8.4–10.5)
Chloride: 104 meq/L (ref 96–112)
Creatinine, Ser: 1.13 mg/dL (ref 0.40–1.20)
GFR: 46.75 mL/min — ABNORMAL LOW (ref >60.00)
Glucose, Bld: 108 mg/dL — ABNORMAL HIGH (ref 70–99)
Potassium: 4.6 meq/L (ref 3.5–5.1)
Sodium: 141 meq/L (ref 135–145)

## 2024-03-25 LAB — IBC + FERRITIN
Ferritin: 10.9 ng/mL (ref 10.0–291.0)
Iron: 53 ug/dL (ref 42–145)
Saturation Ratios: 14.2 % — ABNORMAL LOW (ref 20.0–50.0)
TIBC: 373.8 ug/dL (ref 250.0–450.0)
Transferrin: 267 mg/dL (ref 212.0–360.0)

## 2024-03-25 MED ORDER — ROSUVASTATIN CALCIUM 10 MG PO TABS: 10.0000 mg | ORAL_TABLET | Freq: Every day | ORAL | 2 refills | Status: AC

## 2024-03-25 MED ORDER — ESOMEPRAZOLE MAGNESIUM 40 MG PO CPDR: 40.0000 mg | DELAYED_RELEASE_CAPSULE | Freq: Every day | ORAL | 1 refills | Status: AC

## 2024-03-25 MED ORDER — LOSARTAN POTASSIUM 25 MG PO TABS: ORAL_TABLET | ORAL | 1 refills | Status: AC

## 2024-03-25 MED ORDER — SERTRALINE HCL 25 MG PO TABS: ORAL_TABLET | ORAL | 1 refills | Status: DC

## 2024-03-25 NOTE — Progress Notes (Signed)
 Subjective:    [name], female    DOB: 08-May-1946, 78 y.o.   MRN: [id number]  Patient here for  Chief Complaint  Patient presents with   Medical Management of Chronic Issues    2 mth f/u    HPI Here for a scheduled follow up - follow up regarding hypertension, depression and hypercholesterolemia. Continues on losartan  and crestor . Admitted 10/2023 - with SBO. Had f/u surgery 11/14/23 - stable. Last visit, restarted hydrochlorothiazide  12.5mg  q day. Reports increased right knee pain - worsened over the last few weeks. Has seen Dr Lorelle. Has f/u with him next week.  Reports recently - worked outside - noticed some stuffiness and runny nose. Some cough. No sob reported. No chest pain. No abdominal pain or bowel change. Discussed nexium .    Past Medical History:  Diagnosis Date   Allergy    Depression    History of chicken pox    HLD (hyperlipidemia)    Hx: UTI (urinary tract infection)    Hypertension    Sleep apnea    Past Surgical History:  Procedure Laterality Date   COLONOSCOPY WITH PROPOFOL  N/A 06/20/2016   Procedure: COLONOSCOPY WITH PROPOFOL ;  Surgeon: Rogelia Copping, MD;  Location: [facility] ENDOSCOPY;  Service: Endoscopy;  Laterality: N/A;   COLONOSCOPY WITH PROPOFOL  N/A 02/14/2022   Procedure: COLONOSCOPY WITH PROPOFOL ;  Surgeon: Copping Rogelia, MD;  Location: [facility] ENDOSCOPY;  Service: Endoscopy;  Laterality: N/A;   ESOPHAGOGASTRODUODENOSCOPY N/A 02/14/2022   Procedure: ESOPHAGOGASTRODUODENOSCOPY (EGD);  Surgeon: Copping Rogelia, MD;  Location: [facility] ENDOSCOPY;  Service: Endoscopy;  Laterality: N/A;   ESOPHAGOGASTRODUODENOSCOPY (EGD) WITH PROPOFOL  N/A 06/20/2016   Procedure: ESOPHAGOGASTRODUODENOSCOPY (EGD) WITH PROPOFOL ;  Surgeon: Rogelia Copping, MD;  Location: [facility] ENDOSCOPY;  Service: Endoscopy;  Laterality: N/A;   TONSILLECTOMY     as a child   TUMOR REMOVAL     behind ear   Family History  Problem Relation Age of Onset   Hypertension Mother    Cancer Sister         colon cancer   Hypertension Sister    Diabetes Sister    Hypertension Brother    Hypertension Maternal Aunt    Breast cancer Maternal Aunt 60   Hypertension Maternal Uncle    Stroke Maternal Grandmother    Hypertension Maternal Grandmother    Social History   Socioeconomic History   Marital status: Divorced    Spouse name: Not on file   Number of children: Not on file   Years of education: Not on file   Highest education level: Associate degree: occupational, scientist, product/process development, or vocational program  Occupational History   Not on file  Tobacco Use   Smoking status: Never   Smokeless tobacco: Never  Vaping Use   Vaping status: Never Used  Substance and Sexual Activity   Alcohol use: No    Alcohol/week: 0.0 standard drinks of alcohol   Drug use: No   Sexual activity: Not Currently  Other Topics Concern   Not on file  Social History Narrative   Not on file   Social Drivers of Health   Financial Resource Strain: Low Risk  (03/22/2024)   Overall Financial Resource Strain (CARDIA)    Difficulty of Paying Living Expenses: Not very hard  Recent Concern: Financial Resource Strain - Medium Risk (01/24/2024)   Overall Financial Resource Strain (CARDIA)    Difficulty of Paying Living Expenses: Somewhat hard  Food Insecurity: No Food Insecurity (03/22/2024)   Hunger Vital Sign  Worried About Programme Researcher, Broadcasting/film/video in the Last Year: Never true    Ran Out of Food in the Last Year: Never true  Transportation Needs: No Transportation Needs (03/22/2024)   PRAPARE - Administrator, Civil Service (Medical): No    Lack of Transportation (Non-Medical): No  Physical Activity: Inactive (03/22/2024)   Exercise Vital Sign    Days of Exercise per Week: 0 days    Minutes of Exercise per Session: Not on file  Stress: Stress Concern Present (03/22/2024)   [employer] of Occupational Health - Occupational Stress Questionnaire    Feeling of Stress: To some extent  Social  Connections: Socially Isolated (03/22/2024)   Social Connection and Isolation Panel    Frequency of Communication with Friends and Family: Twice a week    Frequency of Social Gatherings with Friends and Family: Once a week    Attends Religious Services: Never    Database Administrator or Organizations: No    Attends Engineer, Structural: Not on file    Marital Status: Divorced     Review of Systems  Constitutional:  Negative for appetite change and unexpected weight change.  HENT:  Positive for congestion.        Runny nose.   Respiratory:  Negative for chest tightness and shortness of breath.        Some cough.   Cardiovascular:  Negative for chest pain and palpitations.  Gastrointestinal:  Negative for abdominal pain, diarrhea, nausea and vomiting.  Genitourinary:  Negative for difficulty urinating and dysuria.  Musculoskeletal:  Negative for myalgias.       Right knee pain as outlined.   Skin:  Negative for color change and rash.  Neurological:  Negative for dizziness and headaches.  Psychiatric/Behavioral:  Negative for agitation and dysphoric mood.        Objective:     BP 120/64   Pulse 78   Temp 97.6 F (36.4 C) (Oral)   Ht 5' 7 (1.702 m)   Wt 223 lb 12 oz (101.5 kg)   SpO2 99%   BMI 35.04 kg/m  Wt Readings from Last 3 Encounters:  04/01/24 222 lb 3.2 oz (100.8 kg)  03/25/24 223 lb 12 oz (101.5 kg)  01/28/24 224 lb (101.6 kg)    Physical Exam Vitals reviewed.  Constitutional:      General: She is not in acute distress.    Appearance: Normal appearance.  HENT:     Head: Normocephalic and atraumatic.     Right Ear: External ear normal.     Left Ear: External ear normal.  Eyes:     General: No scleral icterus.       Right eye: No discharge.        Left eye: No discharge.     Conjunctiva/sclera: Conjunctivae normal.  Neck:     Thyroid : No thyromegaly.     Comments: Left Neck fullness.  Cardiovascular:     Rate and Rhythm: Normal rate and  regular rhythm.  Pulmonary:     Effort: No respiratory distress.     Breath sounds: Normal breath sounds. No wheezing.  Abdominal:     General: Bowel sounds are normal.     Palpations: Abdomen is soft.     Tenderness: There is no abdominal tenderness.  Musculoskeletal:        General: No swelling or tenderness.     Cervical back: Neck supple. No tenderness.  Lymphadenopathy:     Cervical: No  cervical adenopathy.  Skin:    Findings: No erythema or rash.  Neurological:     Mental Status: She is alert.  Psychiatric:        Mood and Affect: Mood normal.        Behavior: Behavior normal.         Outpatient Encounter Medications as of 03/25/2024  Medication Sig   albuterol  (PROVENTIL ) (2.5 MG/3ML) 0.083% nebulizer solution Take 3 mLs (2.5 mg total) by nebulization every 4 (four) hours as needed for wheezing or shortness of breath.   Ferrous Sulfate  (IRON  PO) Take by mouth daily. Gummy   fluticasone -salmeterol (ADVAIR) 250-50 MCG/ACT AEPB INHALE 1 PUFF TWICE A DAY AS DIRECTED **RINSE MOUTH AFTER USE** (MORNING AND AT BEDTIME)   hydrochlorothiazide  (HYDRODIURIL ) 25 MG tablet Take 25 mg by mouth daily. (Patient taking differently: Take 12.5 mg by mouth daily.)   VITAMIN D -VITAMIN K PO Take by mouth. (Patient taking differently: Take by mouth. Chew two gummies daily)   [DISCONTINUED] albuterol  (VENTOLIN  HFA) 108 (90 Base) MCG/ACT inhaler INHALE 2 PUFFS INTO THE LUNGS EVERY 6 HOURS AS NEEDED FOR WHEEZING OR SHORTNESS OF BREATH   esomeprazole  (NEXIUM ) 40 MG capsule Take 1 capsule (40 mg total) by mouth daily.   losartan  (COZAAR ) 25 MG tablet TAKE 1 TABLET(25 MG) BY MOUTH DAILY   rosuvastatin  (CRESTOR ) 10 MG tablet Take 1 tablet (10 mg total) by mouth daily.   sertraline  (ZOLOFT ) 25 MG tablet Take 1 1/2 tablet per day.   [DISCONTINUED] esomeprazole  (NEXIUM ) 40 MG capsule Take 1 capsule (40 mg total) by mouth daily.   [DISCONTINUED] losartan  (COZAAR ) 25 MG tablet TAKE 1 TABLET(25 MG) BY  MOUTH DAILY   [DISCONTINUED] rosuvastatin  (CRESTOR ) 10 MG tablet Take 1 tablet (10 mg total) by mouth daily.   [DISCONTINUED] sertraline  (ZOLOFT ) 25 MG tablet Take 1 1/2 tablet per day.   No facility-administered encounter medications on file as of 03/25/2024.     Lab Results  Component Value Date   WBC 7.9 03/25/2024   HGB 11.5 (L) 03/25/2024   HCT 33.7 (L) 03/25/2024   PLT 311.0 03/25/2024   GLUCOSE 108 (H) 03/25/2024   CHOL 117 01/24/2024   TRIG 81.0 01/24/2024   HDL 45.20 01/24/2024   LDLCALC 55 01/24/2024   ALT 11 01/24/2024   AST 14 01/24/2024   NA 141 03/25/2024   K 4.6 03/25/2024   CL 104 03/25/2024   CREATININE 1.13 03/25/2024   BUN 15 03/25/2024   CO2 28 03/25/2024   TSH 1.72 04/23/2023   HGBA1C 6.6 (H) 01/24/2024    DG ABD ACUTE 2+V W 1V CHEST Result Date: 10/21/2023 CLINICAL DATA:  SBO EXAM: DG ABDOMEN ACUTE WITH 1 VIEW CHEST COMPARISON:  10/20/2023. FINDINGS: There is no evidence of dilated bowel loops or free intraperitoneal air. NG tube tip in the midline upper abdomen below the diaphragm. No radiopaque calculi or other significant radiographic abnormality is seen. Heart size and mediastinal contours are within normal limits. Both lungs are clear. IMPRESSION: Negative abdominal radiographs.  No acute cardiopulmonary disease. Electronically Signed   By: Fonda Field M.D.   On: 10/21/2023 10:42   DG Abd Portable 1 View Result Date: 10/20/2023 CLINICAL DATA:  Enteric tube placement. EXAM: PORTABLE ABDOMEN - 1 VIEW COMPARISON:  Abdomen 02/12/2005 and chest x-ray 04/13/2023 FINDINGS: Enteric tube is present with tip over the stomach in the left upper quadrant just below the expected region of the gastroesophageal junction. Side port appears just above the expected region of  the gastroesophageal junction as this could be advanced proximally 5 cm. Mild prominence of the infrahilar vessels which may be due to mild vascular congestion. Upper abdomen demonstrates  nonobstructive bowel gas pattern. No free peritoneal air. IMPRESSION: Enteric tube with tip over the stomach in the left upper quadrant just below the expected region of the gastroesophageal junction. Side port appears just above the expected region of the gastroesophageal junction as this could be advanced proximally 5 cm. Electronically Signed   By: Toribio Agreste M.D.   On: 10/20/2023 12:51   CT ABDOMEN PELVIS WO CONTRAST Result Date: 10/20/2023 CLINICAL DATA:  Acute abdominal pain. EXAM: CT ABDOMEN AND PELVIS WITHOUT CONTRAST TECHNIQUE: Multidetector CT imaging of the abdomen and pelvis was performed following the standard protocol without IV contrast. RADIATION DOSE REDUCTION: This exam was performed according to the departmental dose-optimization program which includes automated exposure control, adjustment of the mA and/or kV according to patient size and/or use of iterative reconstruction technique. COMPARISON:  08/27/2007 FINDINGS: Lower chest: No acute findings. Hepatobiliary: No mass visualized on this unenhanced exam. A few small hepatic cysts are again noted. A few tiny gallstones are seen, but without signs of cholecystitis or biliary ductal dilatation. Pancreas: No mass or inflammatory process visualized on this unenhanced exam. Spleen:  Within normal limits in size. Adrenals/Urinary tract: No evidence of urolithiasis or hydronephrosis. Unremarkable unopacified urinary bladder. Stomach/Bowel: Moderate hiatal hernia is seen. Moderately dilated fluid-filled small bowel loops are seen with transition point seen distally in the right lower quadrant, suspicious for adhesion. No mass or focal inflammatory process identified. Tiny amount of perihepatic and pelvic ascites noted. Vascular/Lymphatic: No pathologically enlarged lymph nodes identified. No evidence of abdominal aortic aneurysm. Reproductive:  No mass or other significant abnormality. Other:  None. Musculoskeletal:  No suspicious bone lesions  identified. IMPRESSION: Distal small bowel obstruction, with transition point in right lower quadrant suspicious for adhesion. Tiny amount of ascites. Moderate hiatal hernia. Cholelithiasis. No radiographic evidence of cholecystitis. Electronically Signed   By: Norleen DELENA Kil M.D.   On: 10/20/2023 10:57       Assessment & Plan:  Gastroesophageal reflux disease with esophagitis without hemorrhage Assessment & Plan: Nexium  - refilled.    Hypercholesteremia Assessment & Plan: Continue crestor .  Low cholesterol diet and exercise.  Follow lipid panel and liver function tests. No change in medication.  Lab Results  Component Value Date   CHOL 117 01/24/2024   HDL 45.20 01/24/2024   LDLCALC 55 01/24/2024   TRIG 81.0 01/24/2024   CHOLHDL 3 01/24/2024     Orders: -     Lipid panel; Future -     Hepatic function panel; Future -     Basic metabolic panel with GFR -     Basic metabolic panel with GFR; Future  Iron  deficiency anemia, unspecified iron  deficiency anemia type Assessment & Plan:  Saw Dr Jacobo - 09/05/22 - anemia.  Suspect IDA. Colonoscopy and EGD in October 2023 did not reveal any significant pathology. Patient could not tolerate video endoscopy. S/p IV venofer .  Last check - hgb and iron  studies wnl. Recommended f/u. Continue to follow cbc and iron  studies.   Orders: -     CBC with Differential/Platelet -     IBC + Ferritin  Hyperglycemia Assessment & Plan: Low carb diet and exercise. Follow met b and A1c.  Lab Results  Component Value Date   HGBA1C 6.6 (H) 01/24/2024     Orders: -     Hemoglobin A1c;  Future  Essential hypertension, benign Assessment & Plan: On hydrochlorothiazide  12.5mg  q day and losartan . Blood pressure as outlined. Follow pressures. Follow metabolic panel.    Cough variant asthma Assessment & Plan: Treat allergy symptoms. Continue advair. Has rescue inhaler. Keep f/u with pulmonary next week.    Anemia, unspecified type Assessment &  Plan: Check cbc and iron  studies.    Other orders -     Esomeprazole  Magnesium ; Take 1 capsule (40 mg total) by mouth daily.  Dispense: 90 capsule; Refill: 1 -     Losartan  Potassium; TAKE 1 TABLET(25 MG) BY MOUTH DAILY  Dispense: 90 tablet; Refill: 1 -     Rosuvastatin  Calcium ; Take 1 tablet (10 mg total) by mouth daily.  Dispense: 90 tablet; Refill: 2 -     Sertraline  HCl; Take 1 1/2 tablet per day.  Dispense: 135 tablet; Refill: 1     Allena Hamilton, MD

## 2024-03-26 ENCOUNTER — Ambulatory Visit: Payer: Self-pay | Admitting: Internal Medicine

## 2024-03-26 NOTE — Telephone Encounter (Signed)
 Copied from CRM [id number]. Topic: Clinical - Lab/Test Results >> Mar 26, 2024  3:05 PM Mercedes MATSU wrote: Reason for CRM: Lab results relayed to patient.

## 2024-04-01 ENCOUNTER — Ambulatory Visit: Admitting: Pulmonary Disease

## 2024-04-01 ENCOUNTER — Encounter: Payer: Self-pay | Admitting: Pulmonary Disease

## 2024-04-01 VITALS — BP 126/60 | HR 111 | Temp 98.1°F | Ht 67.0 in | Wt 222.2 lb

## 2024-04-01 DIAGNOSIS — J309 Allergic rhinitis, unspecified: Secondary | ICD-10-CM

## 2024-04-01 DIAGNOSIS — K219 Gastro-esophageal reflux disease without esophagitis: Secondary | ICD-10-CM | POA: Diagnosis not present

## 2024-04-01 DIAGNOSIS — R053 Chronic cough: Secondary | ICD-10-CM

## 2024-04-01 DIAGNOSIS — M170 Bilateral primary osteoarthritis of knee: Secondary | ICD-10-CM | POA: Diagnosis not present

## 2024-04-01 DIAGNOSIS — J3089 Other allergic rhinitis: Secondary | ICD-10-CM

## 2024-04-01 DIAGNOSIS — J45991 Cough variant asthma: Secondary | ICD-10-CM | POA: Diagnosis not present

## 2024-04-01 LAB — NITRIC OXIDE: Nitric Oxide: 22

## 2024-04-01 MED ORDER — ALBUTEROL SULFATE HFA 108 (90 BASE) MCG/ACT IN AERS: 2.0000 | INHALATION_SPRAY | Freq: Four times a day (QID) | RESPIRATORY_TRACT | 2 refills | Status: AC | PRN

## 2024-04-01 MED ORDER — SPIRIVA RESPIMAT 1.25 MCG/ACT IN AERS: 2.0000 | INHALATION_SPRAY | Freq: Every day | RESPIRATORY_TRACT | 0 refills | Status: DC

## 2024-04-01 NOTE — Progress Notes (Unsigned)
 Subjective:    [name], female    DOB: 04/08/1946, 78 y.o.   MRN: [id number]  Patient Care Team: Glendia Shad, MD as PCP - General (Internal Medicine) Darliss Rogue, MD as PCP - Cardiology (Cardiology) Geronimo Manuelita SAUNDERS, [facility] as Pharmacist (Pharmacist) Malachy Comer GAILS, NP as Nurse Practitioner (Nurse Practitioner) Tamea Dedra CROME, MD as Consulting Physician (Pulmonary Disease)  Chief Complaint  Patient presents with   Asthma    Cough with occasional phlegm. Shortness of breath on exertion and occasional at rest.     BACKGROUND/INTERVAL:Patient is a 78 year old lifelong never smoker who has been previously seen here for issues with cough variant asthma. I last saw the patient in 03 Oct 2023, currently maintained on Advair 250/51 inhalation twice a day.  HPI Discussed the use of AI scribe software for clinical note transcription with the patient, who gave verbal consent to proceed.  History of Present Illness   Susan Miller is a 78 year old female with asthma who presents with cough.  She has experienced a persistent cough for the past month, which she attributes to working outside and potential allergy triggers. The cough is mostly dry with occasional production of colorless phlegm, and she notes a mild postnasal drip. She uses her Advair inhaler twice daily and another inhaler sporadically, but recently dismantled her albuterol  inhaler accidentally and could not reassemble it.  She has started taking an iron  supplement in gummy form due to difficulty swallowing pills. The supplement causes her to burp, which could indicate worsening reflux symptoms. She continues to take Nexium  for reflux management.  She received her flu shot approximately one month ago.  She reports a little postnasal drip, but not terrible, and the phlegm she produces during coughing has no color.  She has not had any fevers, chills or sweats.  No other symptomatology.     DATA 05/16/2021 CXR PA and lateral: No acute cardiopulmonary disease 07/08/2021 PFTs: FEV1 2.10 L or 87% predicted, FVC 2.62 L or 82% predicted, FEV1/FVC 80%, there is a mild bronchodilator response.  Lung volumes were normal except for ERV at 12% this is indicative of obesity.  There is a significant small airways component.  Diffusion capacity was normal.  Consistent with reactive airways disease/asthma 04/13/2023 chest x-ray PA and lateral: No active cardiopulmonary disease. 06/28/2023 PFTs: FEV1 2.07 L or 88% predicted, FVC 2.69 L or 86% predicted, FEV1/FVC 77%, lung volumes and diffusion capacity normal.  Overall normal pulmonary function testing  Review of Systems A 10 point review of systems was performed and it is as noted above otherwise negative.   Patient Active Problem List   Diagnosis Date Noted   History of small bowel obstruction 11/10/2023   Anemia 11/07/2023   Small bowel obstruction (HCC) 10/20/2023   Breast cancer screening 09/02/2023   Post-viral cough syndrome 06/28/2023   Asthmatic bronchitis 04/29/2023   Rash 12/23/2022   Reactive airways dysfunction syndrome (HCC) 12/23/2022   Foot pain 12/23/2022   Allergic rhinitis 11/14/2022   Mild reactive airways disease 10/11/2022   Polyp of ascending colon    Knee pain, right 12/13/2021   Major depressive disorder, single episode, mild 12/11/2021   Iron  deficiency anemia, unspecified 04/10/2021   Aortic atherosclerosis 04/10/2021   UTI (urinary tract infection) 03/27/2021   Cutaneous skin tags 12/12/2020   B12 deficiency 12/12/2020   OSA (obstructive sleep apnea) 04/18/2020   Numbness and tingling of both feet 02/20/2020   Daytime hypersomnolence 01/29/2020  Dysuria 12/07/2019   Snoring 12/07/2019   Hypercholesteremia 10/09/2019   Back pain 11/15/2018   SOB (shortness of breath) 11/15/2018   Decreased GFR 11/14/2018   Hyperbilirubinemia 11/14/2018   Abdominal bloating 12/09/2017   Osteopenia 04/29/2017    Hyperglycemia 04/27/2017   Reflux esophagitis    Heartburn    Special screening for malignant neoplasms, colon    Hair loss 10/26/2015   Fatigue 10/21/2015   Vitamin D  deficiency 03/15/2015   Weight loss counseling, encounter for 02/22/2015   Facial erythema 07/27/2014   Health care maintenance 07/05/2014   BP (high blood pressure) 03/26/2014   Headache 03/08/2014   Eyelid lesion 03/08/2014   Light headedness 06/22/2013   Cough 05/18/2013   GERD (gastroesophageal reflux disease) 05/18/2013   Swelling of extremity 03/01/2013   Left knee pain 03/01/2013   Essential hypertension, benign 12/15/2012   Frequent UTI 12/15/2012   Mild depression 12/15/2012   Environmental allergies 12/15/2012    Social History   Tobacco Use   Smoking status: Never   Smokeless tobacco: Never  Substance Use Topics   Alcohol use: No    Alcohol/week: 0.0 standard drinks of alcohol    Allergies  Allergen Reactions   Codeine Hives   Iodinated Contrast Media Swelling    Current Meds  Medication Sig   albuterol  (PROVENTIL ) (2.5 MG/3ML) 0.083% nebulizer solution Take 3 mLs (2.5 mg total) by nebulization every 4 (four) hours as needed for wheezing or shortness of breath.   esomeprazole  (NEXIUM ) 40 MG capsule Take 1 capsule (40 mg total) by mouth daily.   Ferrous Sulfate  (IRON  PO) Take by mouth daily. Gummy   fluticasone -salmeterol (ADVAIR) 250-50 MCG/ACT AEPB INHALE 1 PUFF TWICE A DAY AS DIRECTED **RINSE MOUTH AFTER USE** (MORNING AND AT BEDTIME)   hydrochlorothiazide  (HYDRODIURIL ) 25 MG tablet Take 25 mg by mouth daily. (Patient taking differently: Take 12.5 mg by mouth daily.)   losartan  (COZAAR ) 25 MG tablet TAKE 1 TABLET(25 MG) BY MOUTH DAILY   rosuvastatin  (CRESTOR ) 10 MG tablet Take 1 tablet (10 mg total) by mouth daily.   sertraline  (ZOLOFT ) 25 MG tablet Take 1 1/2 tablet per day.   Tiotropium Bromide (SPIRIVA  RESPIMAT) 1.25 MCG/ACT AERS Inhale 2 puffs into the lungs daily.   VITAMIN D -VITAMIN  K PO Take by mouth. (Patient taking differently: Take by mouth. Chew two gummies daily)   [DISCONTINUED] albuterol  (VENTOLIN  HFA) 108 (90 Base) MCG/ACT inhaler INHALE 2 PUFFS INTO THE LUNGS EVERY 6 HOURS AS NEEDED FOR WHEEZING OR SHORTNESS OF BREATH    Immunization History  Administered Date(s) Administered    sv, Bivalent, Protein Subunit Rsvpref,pf (Abrysvo) 02/17/2022   Fluad Quad(high Dose 65+) 01/31/2019, 01/29/2020, 01/25/2022   INFLUENZA, HIGH DOSE SEASONAL PF 01/11/2016, 04/27/2017, 01/18/2018, 01/28/2024   Influenza,inj,Quad PF,6+ Mos 02/24/2013, 02/27/2014, 02/22/2015   Moderna Covid-19 Fall Seasonal Vaccine 56yrs & older 02/17/2022   PFIZER(Purple Top)SARS-COV-2 Vaccination 06/23/2019, 07/14/2019, 01/06/2022   Pneumococcal Conjugate-13 11/06/2016   Pneumococcal Polysaccharide-23 12/06/2017   Pneumococcal-Unspecified 11/06/2016   RSV,unspecified 01/06/2022        Objective:     Vitals:   04/01/24 1335  BP: 126/60  Pulse: (!) 111  Temp: 98.1 F (36.7 C)  Height: 5' 7 (1.702 m)  Weight: 222 lb 3.2 oz (100.8 kg)  SpO2: 96%  TempSrc: Temporal  BMI (Calculated): 34.79     SpO2: 96 %  GENERAL: Well-developed, obese woman, no acute distress, fully ambulatory, no conversational dyspnea. HEAD: Normocephalic, atraumatic.  EYES: Pupils equal, round, reactive to  light.  No scleral icterus.  MOUTH: Few chipped teeth, multiple fillings, otherwise dentition intact, oral mucosa moist. NECK: Supple. No thyromegaly. Trachea midline. No JVD.  No adenopathy. PULMONARY: Good air entry bilaterally.  No adventitious sounds. CARDIOVASCULAR: S1 and S2. Regular rate and rhythm.  ABDOMEN: Benign. MUSCULOSKELETAL: No joint deformity, no clubbing, no edema.  NEUROLOGIC: No overt focal deficit, no gait disturbance, speech is fluent. SKIN: Intact,warm,dry. PSYCH: Mood and behavior normal.   Lab Results  Component Value Date   NITRICOXIDE 22 04/01/2024   This result suggests low  (<25) Type 2 (T2) airway inflammation indicating a low likelihood of active T2-driven airway inflammation; reduced probability of response to inhaled corticosteroids.     Assessment & Plan:     ICD-10-CM   1. Cough variant asthma  J45.991 Nitric oxide     2. Chronic cough  R05.3     3. Gastroesophageal reflux disease without esophagitis  K21.9     4. Non-seasonal allergic rhinitis, unspecified trigger  J30.89       Orders Placed This Encounter  Procedures   Nitric oxide     Meds ordered this encounter  Medications   albuterol  (VENTOLIN  HFA) 108 (90 Base) MCG/ACT inhaler    Sig: Inhale 2 puffs into the lungs every 6 (six) hours as needed for wheezing or shortness of breath.    Dispense:  18 g    Refill:  2   Tiotropium Bromide (SPIRIVA  RESPIMAT) 1.25 MCG/ACT AERS    Sig: Inhale 2 puffs into the lungs daily.    Dispense:  4 g    Refill:  0    Lot Number?:   [id number]    Expiration Date?:   07/06/2025    NDC:   9402-9839-38 [731671]    Quantity:   1   Discussion:    Cough variant asthma Persistent cough for the last month, possibly triggered by outdoor work and allergies. Increased allergy cells in recent blood work suggest less controlled asthma. Occasional phlegm production, but mostly dry cough. Possible contribution from gastroesophageal reflux disease due to recent iron  supplement causing burping. - Continue Advair twice daily. - Trial of Spiriva  1.25 mcg 2 puffs daily to be used once daily in addition to Advair. - Provided a sample of Spiriva  and demonstrated its use. - Prescribed albuterol  inhaler for use as needed.  Gastroesophageal reflux disease Recent increase in burping possibly due to iron  supplement, which may exacerbate reflux symptoms. Continues to take Nexium . - Ensure iron  supplement is taken with a meal to minimize reflux symptoms. - Discuss potential alternative treatments for reflux with primary care provider.  Allergic rhinitis Mild postnasal drip, not  severe. Previous allergy testing was not significant. - Continue current management.      Advised if symptoms do not improve or worsen, to please contact office for sooner follow up or seek emergency care.    I spent 36 minutes of dedicated to the care of this patient on the date of this encounter to include pre-visit review of records, face-to-face time with the patient discussing conditions above, post visit ordering of testing, clinical documentation with the electronic health record, making appropriate referrals as documented, and communicating necessary findings to members of the patients care team.     C. Leita Sanders, MD Advanced Bronchoscopy [facility]    *This note was generated using voice recognition software/Dragon and/or AI transcription program.  Despite best efforts to proofread, errors can occur which can change the meaning. Any transcriptional errors that result  from this process are unintentional and may not be fully corrected at the time of dictation.

## 2024-04-01 NOTE — Patient Instructions (Signed)
 VISIT SUMMARY:  During today's visit, we discussed your persistent cough and diarrhea. You mentioned that your cough has been ongoing for the past month and may be related to working outside and potential allergies. We also reviewed your current medications and recent changes, including the addition of an iron  supplement.  YOUR PLAN:  -COUGH VARIANT ASTHMA: Cough variant asthma is a type of asthma where the main symptom is a dry, persistent cough. Your recent blood work suggests that your asthma may not be well-controlled. Continue using Advair twice daily, and start using the new inhaler once daily as demonstrated. Use the albuterol  inhaler as needed for relief.  -GASTROESOPHAGEAL REFLUX DISEASE: Gastroesophageal reflux disease (GERD) occurs when stomach acid frequently flows back into the tube connecting your mouth and stomach. Your recent increase in burping may be due to the iron  supplement. Take the iron  supplement with a meal to help reduce reflux symptoms, and discuss alternative treatments with your primary care provider.  -ALLERGIC RHINITIS: Allergic rhinitis is an allergic reaction that causes sneezing, congestion, and postnasal drip. Your symptoms are mild, so continue with your current management plan.  INSTRUCTIONS:  Please follow up with your primary care provider to discuss potential alternative treatments for your reflux. Continue using your medications as prescribed and monitor your symptoms. If you have any concerns or if your symptoms worsen, contact our office.

## 2024-04-02 ENCOUNTER — Encounter: Payer: Self-pay | Admitting: Pulmonary Disease

## 2024-04-02 ENCOUNTER — Encounter: Payer: Self-pay | Admitting: Internal Medicine

## 2024-04-02 DIAGNOSIS — J45991 Cough variant asthma: Secondary | ICD-10-CM | POA: Insufficient documentation

## 2024-04-03 NOTE — Assessment & Plan Note (Signed)
 Saw Dr Jacobo - 09/05/22 - anemia.  Suspect IDA. Colonoscopy and EGD in October 2023 did not reveal any significant pathology. Patient could not tolerate video endoscopy. S/p IV venofer .  Last check - hgb and iron  studies wnl. Recommended f/u. Continue to follow cbc and iron  studies.

## 2024-04-03 NOTE — Assessment & Plan Note (Signed)
 Treat allergy symptoms. Continue advair. Has rescue inhaler. Keep f/u with pulmonary next week.

## 2024-04-03 NOTE — Assessment & Plan Note (Signed)
 Nexium refilled

## 2024-04-03 NOTE — Assessment & Plan Note (Signed)
Check cbc and iron studies.

## 2024-04-03 NOTE — Assessment & Plan Note (Signed)
 On hydrochlorothiazide  12.5mg  q day and losartan . Blood pressure as outlined. Follow pressures. Follow metabolic panel.

## 2024-04-03 NOTE — Assessment & Plan Note (Signed)
 Low carb diet and exercise. Follow met b and A1c.  Lab Results  Component Value Date   HGBA1C 6.6 (H) 01/24/2024

## 2024-04-03 NOTE — Assessment & Plan Note (Signed)
 Continue crestor .  Low cholesterol diet and exercise.  Follow lipid panel and liver function tests. No change in medication.  Lab Results  Component Value Date   CHOL 117 01/24/2024   HDL 45.20 01/24/2024   LDLCALC 55 01/24/2024   TRIG 81.0 01/24/2024   CHOLHDL 3 01/24/2024

## 2024-04-14 ENCOUNTER — Other Ambulatory Visit

## 2024-04-14 DIAGNOSIS — R739 Hyperglycemia, unspecified: Secondary | ICD-10-CM

## 2024-04-14 DIAGNOSIS — E7800 Pure hypercholesterolemia, unspecified: Secondary | ICD-10-CM

## 2024-04-14 LAB — BASIC METABOLIC PANEL WITH GFR
BUN: 15 mg/dL (ref 6–23)
CO2: 29 meq/L (ref 19–32)
Calcium: 9.6 mg/dL (ref 8.4–10.5)
Chloride: 105 meq/L (ref 96–112)
Creatinine, Ser: 1.02 mg/dL (ref 0.40–1.20)
GFR: 52.84 mL/min — ABNORMAL LOW (ref >60.00)
Glucose, Bld: 120 mg/dL — ABNORMAL HIGH (ref 70–99)
Potassium: 4.3 meq/L (ref 3.5–5.1)
Sodium: 143 meq/L (ref 135–145)

## 2024-04-14 LAB — HEPATIC FUNCTION PANEL
ALT: 10 U/L (ref 0–35)
AST: 14 U/L (ref 0–37)
Albumin: 4.1 g/dL (ref 3.5–5.2)
Alkaline Phosphatase: 70 U/L (ref 39–117)
Bilirubin, Direct: 0 mg/dL (ref 0.0–0.3)
Total Bilirubin: 0.4 mg/dL (ref 0.2–1.2)
Total Protein: 7 g/dL (ref 6.0–8.3)

## 2024-04-14 LAB — LIPID PANEL
Cholesterol: 103 mg/dL (ref 0–200)
HDL: 40 mg/dL (ref >39.00)
LDL Cholesterol: 47 mg/dL (ref 0–99)
NonHDL: 62.58
Total CHOL/HDL Ratio: 3
Triglycerides: 77 mg/dL (ref 0.0–149.0)
VLDL: 15.4 mg/dL (ref 0.0–40.0)

## 2024-04-14 LAB — HEMOGLOBIN A1C: Hgb A1c MFr Bld: 6.4 % (ref 4.6–6.5)

## 2024-04-18 ENCOUNTER — Encounter: Payer: Self-pay | Admitting: Internal Medicine

## 2024-04-18 ENCOUNTER — Encounter: Payer: Self-pay | Admitting: Nurse Practitioner

## 2024-04-18 ENCOUNTER — Ambulatory Visit: Payer: Self-pay

## 2024-04-18 ENCOUNTER — Ambulatory Visit (INDEPENDENT_AMBULATORY_CARE_PROVIDER_SITE_OTHER): Admitting: Nurse Practitioner

## 2024-04-18 VITALS — BP 132/72 | HR 94 | Temp 98.0°F | Ht 67.0 in | Wt 223.0 lb

## 2024-04-18 DIAGNOSIS — M25512 Pain in left shoulder: Secondary | ICD-10-CM | POA: Insufficient documentation

## 2024-04-18 MED ORDER — CYCLOBENZAPRINE HCL 5 MG PO TABS: 5.0000 mg | ORAL_TABLET | Freq: Every day | ORAL | 0 refills | Status: DC

## 2024-04-18 MED ORDER — PREDNISONE 10 MG (21) PO TBPK: ORAL_TABLET | ORAL | 0 refills | Status: DC

## 2024-04-18 NOTE — Progress Notes (Signed)
 BP 132/72   Pulse 94   Temp 98 F (36.7 C)   Ht 5' 7 (1.702 m)   Wt 223 lb (101.2 kg)   SpO2 96%   BMI 34.93 kg/m    Subjective:    [name], female    DOB: 1946-03-16, 78 y.o.   MRN: [id number]  HPI: Susan Miller is a 78 y.o. female  Chief Complaint  Patient presents with   Shoulder Pain    Pt c/o left shoulder pain x1 week. States shoulder feels slightly better but pain has radiated down side and back.    Discussed the use of AI scribe software for clinical note transcription with the patient, who gave verbal consent to proceed.  History of Present Illness Susan Miller is a 78 year old female who presents with left shoulder pain radiating to the back.  Left shoulder and back pain - Onset one week ago - Pain began in the left shoulder and radiates to the left back near the breast - Described as severe, especially with coughing, feels like 'tearing my body apart' - No history of trauma to the shoulder - No prior use of muscle relaxers - Open to trying a muscle relaxer at bedtime if needed - Tylenol provides inconsistent pain relief  Cough and pulmonary symptoms - Cough present, exacerbates left shoulder and back pain - Recent visit to pulmonologist on the day pain began - New inhaler prescribed for cough management - No lung pathology identified during pulmonology visit - No recent use of steroids such as prednisone   Medication use and allergies - No known medication allergies - Generally reluctant to take pills  Renal function - Recent kidney function testing showed slightly low values, improved since last test         03/25/2024   11:16 AM 01/16/2024   10:26 AM 08/31/2023   10:55 AM  Depression screen PHQ 2/9  Decreased Interest 1 0 1  Down, Depressed, Hopeless 1 1 1   PHQ - 2 Score 2 1 2   Altered sleeping 2 1 1   Tired, decreased energy 2 2 2   Change in appetite 2 3 1   Feeling bad or failure about yourself  1 0 0  Trouble  concentrating 1 0 0  Moving slowly or fidgety/restless 0 0 0  Suicidal thoughts 0 0 0  PHQ-9 Score 10 7  6    Difficult doing work/chores Not difficult at all Somewhat difficult Somewhat difficult     Data saved with a previous flowsheet row definition    Relevant past medical, surgical, family and social history reviewed and updated as indicated. Interim medical history since our last visit reviewed. Allergies and medications reviewed and updated.  Review of Systems  Ten systems reviewed and is negative except as mentioned in HPI      Objective:      BP 132/72   Pulse 94   Temp 98 F (36.7 C)   Ht 5' 7 (1.702 m)   Wt 223 lb (101.2 kg)   SpO2 96%   BMI 34.93 kg/m    Wt Readings from Last 3 Encounters:  04/18/24 223 lb (101.2 kg)  04/01/24 222 lb 3.2 oz (100.8 kg)  03/25/24 223 lb 12 oz (101.5 kg)    Physical Exam GENERAL: Alert, cooperative, well developed, no acute distress HEENT: Normocephalic, normal oropharynx, moist mucous membranes NECK: Non-tender to palpation CHEST: Clear to auscultation bilaterally, No wheezes, rhonchi, or crackles CARDIOVASCULAR: Normal heart rate and  rhythm, S1 and S2 normal without murmurs ABDOMEN: Soft, non-tender, non-distended, without organomegaly, Normal bowel sounds EXTREMITIES: No cyanosis or edema MUSCULOSKELETAL: Left shoulder non-tender to palpation, Tenderness to palpation in the back upper left back NEUROLOGICAL: Cranial nerves grossly intact, Moves all extremities without gross motor or sensory deficit  Results for orders placed or performed in visit on 04/14/24  Basic metabolic panel with GFR   Collection Time: 04/14/24  8:38 AM  Result Value Ref Range   Sodium 143 135 - 145 mEq/L   Potassium 4.3 3.5 - 5.1 mEq/L   Chloride 105 96 - 112 mEq/L   CO2 29 19 - 32 mEq/L   Glucose, Bld 120 (H) 70 - 99 mg/dL   BUN 15 6 - 23 mg/dL   Creatinine, Ser 8.97 0.40 - 1.20 mg/dL   GFR 47.15 (L) >39.99 mL/min   Calcium  9.6 8.4 - 10.5  mg/dL  Hemoglobin J8r   Collection Time: 04/14/24  8:38 AM  Result Value Ref Range   Hgb A1c MFr Bld 6.4 4.6 - 6.5 %  Hepatic function panel   Collection Time: 04/14/24  8:38 AM  Result Value Ref Range   Total Bilirubin 0.4 0.2 - 1.2 mg/dL   Bilirubin, Direct 0.0 0.0 - 0.3 mg/dL   Alkaline Phosphatase 70 39 - 117 U/L   AST 14 0 - 37 U/L   ALT 10 0 - 35 U/L   Total Protein 7.0 6.0 - 8.3 g/dL   Albumin  4.1 3.5 - 5.2 g/dL  Lipid panel   Collection Time: 04/14/24  8:38 AM  Result Value Ref Range   Cholesterol 103 0 - 200 mg/dL   Triglycerides 22.9 0.0 - 149.0 mg/dL   HDL 59.99 >60.99 mg/dL   VLDL 84.5 0.0 - 59.9 mg/dL   LDL Cholesterol 47 0 - 99 mg/dL   Total CHOL/HDL Ratio 3    NonHDL 62.58           Assessment & Plan:   Problem List Items Addressed This Visit   None Visit Diagnoses       Acute pain of left shoulder    -  Primary   Relevant Medications   predniSONE  (STERAPRED UNI-PAK 21 TAB) 10 MG (21) TBPK tablet   cyclobenzaprine (FLEXERIL) 5 MG tablet        Assessment and Plan Assessment & Plan Acute pain of left shoulder Acute left shoulder pain for one week, radiating to the back and near the breast, exacerbated by coughing. Pain is severe, intermittent, and sometimes causes difficulty breathing. No trauma reported. Pain is tender to palpation in the shoulder and back area. Differential includes nerve irritation due to spinal nerve involvement. Kidney function slightly low but improved recently. - Prescribed prednisone  taper to reduce inflammation and improve breathing. - Prescribed Flexeril 5 mg at bedtime for muscle relaxation. - Continue Tylenol for pain management. - Advised use of heat compresses to alleviate pain. - Instructed to send a message on Monday to report progress. - Will consider physical therapy if no improvement.        Follow up plan: Return if symptoms worsen or fail to improve.

## 2024-04-18 NOTE — Telephone Encounter (Signed)
 FYI Only or Action Required?: Action required by provider: request for appointment.  Patient was last seen in primary care on 03/25/2024 by Glendia Shad, MD.  Called Nurse Triage reporting Shoulder Pain.  Symptoms began a week ago.  Interventions attempted: Rest, hydration, or home remedies.  Symptoms are: gradually worsening.Left shoulder pain, radiates down back. No injury known.  Triage Disposition: See PCP When Office is Open (Within 3 Days)  Patient/caregiver understands and will follow disposition?: Yes      Copied from CRM #[id number]. Topic: Clinical - Red Word Triage >> Apr 18, 2024  8:54 AM Rea ORN wrote: Red Word that prompted transfer to Nurse Triage: pt feels like she has torn muscle in left shoulder, painful. Reason for Disposition  [1] MODERATE pain (e.g., interferes with normal activities) AND [2] present > 3 days  Answer Assessment - Initial Assessment Questions 1. ONSET: When did the pain start?     1 week 2. LOCATION: Where is the pain located?     Left shoulder and into back 3. PAIN: How bad is the pain? (Scale 1-10; or mild, moderate, severe)     8 4. WORK OR EXERCISE: Has there been any recent work or exercise that involved this part of the body?     no 5. CAUSE: What do you think is causing the shoulder pain?     unsure 6. OTHER SYMPTOMS: Do you have any other symptoms? (e.g., neck pain, swelling, rash, fever, numbness, weakness)     no 7. PREGNANCY: Is there any chance you are pregnant? When was your last menstrual period?     no  Protocols used: Shoulder Pain-A-AH

## 2024-05-02 ENCOUNTER — Encounter: Payer: Self-pay | Admitting: Internal Medicine

## 2024-05-04 ENCOUNTER — Encounter: Payer: Self-pay | Admitting: Internal Medicine

## 2024-05-05 NOTE — Telephone Encounter (Signed)
 Given she is having arm/chest discomfort, she needs to be evaluated. Please call her and confirm symptoms she is currently having. I can f/u when I am back in the office, but given her symptoms, she needs to be evaluated to confirm nothing more acute going on.

## 2024-05-05 NOTE — Telephone Encounter (Signed)
 Pt read mychart and I discussed this with her along with other mychart message.  Pt states that she is better today & doesn't feel this warrants an evaluation at the moment.

## 2024-05-05 NOTE — Telephone Encounter (Signed)
 Reviewed her my chart message. She is aware that I have been out of the office. She reported some increased gas and belching. With her discomfort and symptoms, she needs to be evaluated. Also, she recently started an iron  supplement (per recent lab). Have her hold the iron  supplement and see if symptoms improve, but still feel she needs to be reevaluated to confirm nothing more acute going on.  Please let her know that I will be out of the office this week.

## 2024-05-05 NOTE — Telephone Encounter (Signed)
 Pt aware of recommendations & states that it comes and goes and it is better today. She doesn't feel that she needs to be seen for this at the moment but would like to consider a referral to have it checked out and possibly get some x-ray. But she will discuss this at her visit in February.

## 2024-05-06 NOTE — Telephone Encounter (Signed)
"  Appt made for Monday.   "

## 2024-05-06 NOTE — Telephone Encounter (Signed)
 I can see her when I return to the office - 05/12/24 at 3:00 (if still open). If not still open, I wiill find another appt for her. If any change or recurring symptoms, she does need to be seen.

## 2024-05-12 ENCOUNTER — Encounter: Payer: Self-pay | Admitting: Internal Medicine

## 2024-05-12 ENCOUNTER — Ambulatory Visit (INDEPENDENT_AMBULATORY_CARE_PROVIDER_SITE_OTHER)

## 2024-05-12 ENCOUNTER — Other Ambulatory Visit: Payer: Self-pay | Admitting: Internal Medicine

## 2024-05-12 ENCOUNTER — Ambulatory Visit: Admitting: Internal Medicine

## 2024-05-12 ENCOUNTER — Ambulatory Visit: Payer: Self-pay | Admitting: Internal Medicine

## 2024-05-12 VITALS — BP 132/82 | HR 81 | Temp 98.3°F | Ht 67.0 in | Wt 214.0 lb

## 2024-05-12 DIAGNOSIS — E559 Vitamin D deficiency, unspecified: Secondary | ICD-10-CM | POA: Diagnosis not present

## 2024-05-12 DIAGNOSIS — J45991 Cough variant asthma: Secondary | ICD-10-CM

## 2024-05-12 DIAGNOSIS — F320 Major depressive disorder, single episode, mild: Secondary | ICD-10-CM

## 2024-05-12 DIAGNOSIS — I10 Essential (primary) hypertension: Secondary | ICD-10-CM

## 2024-05-12 DIAGNOSIS — R079 Chest pain, unspecified: Secondary | ICD-10-CM

## 2024-05-12 DIAGNOSIS — R053 Chronic cough: Secondary | ICD-10-CM

## 2024-05-12 DIAGNOSIS — D509 Iron deficiency anemia, unspecified: Secondary | ICD-10-CM

## 2024-05-12 DIAGNOSIS — R739 Hyperglycemia, unspecified: Secondary | ICD-10-CM

## 2024-05-12 DIAGNOSIS — M25512 Pain in left shoulder: Secondary | ICD-10-CM | POA: Diagnosis not present

## 2024-05-12 DIAGNOSIS — K219 Gastro-esophageal reflux disease without esophagitis: Secondary | ICD-10-CM

## 2024-05-12 DIAGNOSIS — E7800 Pure hypercholesterolemia, unspecified: Secondary | ICD-10-CM | POA: Diagnosis not present

## 2024-05-12 MED ORDER — CYCLOBENZAPRINE HCL 5 MG PO TABS: 5.0000 mg | ORAL_TABLET | Freq: Every evening | ORAL | 0 refills | Status: DC | PRN

## 2024-05-12 MED ORDER — METHYLPREDNISOLONE 4 MG PO TBPK: ORAL_TABLET | ORAL | 0 refills | Status: DC

## 2024-05-12 NOTE — Progress Notes (Signed)
 "  Subjective:    [name], female    DOB: 10/26/1945, 79 y.o.   MRN: [id number]  Patient here for  Chief Complaint  Patient presents with   Shoulder Pain    HPI Here for work in appt - work in appt with concerns regarding shoulder pain. Per review, shoulder pain started around the beginning of December - initially described left shoulder pain that radiates into her back. No history of trauma. States wok - shoulder throbbing. Has been using bengay. Was seen for work in visit 04/18/24 - Mliss Spray. Prescribed prednisone  and flexeril . Felt prednisone  helped. Was reevaluated 04/29/24 - started having indigestion - excessive gas and burping - felt like collected under left breast and across abdomen. Was given lactulose and instructed to take metamucil. Reports increased flare last week. Using heating pad and taking tylenol and flexeril . Breathing stable. No chest pain with increased activity or exertion. Does hurt to cough. No abdominal pain reported.    Past Medical History:  Diagnosis Date   Allergy    Anxiety 1970   i think   Asthma    Cancer (HCC) 2025   Cataract 2009   i think   Depression    GERD (gastroesophageal reflux disease) 2015   i think   History of chicken pox    HLD (hyperlipidemia)    Hx: UTI (urinary tract infection)    Hypertension    Neuromuscular disorder (HCC)    Sleep apnea    Past Surgical History:  Procedure Laterality Date   COLONOSCOPY WITH PROPOFOL  N/A 06/20/2016   Procedure: COLONOSCOPY WITH PROPOFOL ;  Surgeon: Rogelia Copping, MD;  Location: [facility] ENDOSCOPY;  Service: Endoscopy;  Laterality: N/A;   COLONOSCOPY WITH PROPOFOL  N/A 02/14/2022   Procedure: COLONOSCOPY WITH PROPOFOL ;  Surgeon: Copping Rogelia, MD;  Location: [facility] ENDOSCOPY;  Service: Endoscopy;  Laterality: N/A;   ESOPHAGOGASTRODUODENOSCOPY N/A 02/14/2022   Procedure: ESOPHAGOGASTRODUODENOSCOPY (EGD);  Surgeon: Copping Rogelia, MD;  Location: [facility] ENDOSCOPY;  Service: Endoscopy;   Laterality: N/A;   ESOPHAGOGASTRODUODENOSCOPY (EGD) WITH PROPOFOL  N/A 06/20/2016   Procedure: ESOPHAGOGASTRODUODENOSCOPY (EGD) WITH PROPOFOL ;  Surgeon: Rogelia Copping, MD;  Location: [facility] ENDOSCOPY;  Service: Endoscopy;  Laterality: N/A;   TONSILLECTOMY     as a child   TUMOR REMOVAL     behind ear   Family History  Problem Relation Age of Onset   Hypertension Mother    Cancer Sister        colon cancer   Hypertension Sister    Diabetes Sister    Hypertension Brother    Hypertension Maternal Aunt    Breast cancer Maternal Aunt 60   Hypertension Maternal Uncle    Stroke Maternal Grandmother    Hypertension Maternal Grandmother    Arthritis Paternal Grandmother    COPD Brother    Hypertension Brother    ADD / ADHD Son    Alcohol abuse Son    Birth defects Son    Intellectual disability Son    Learning disabilities Son    Social History   Socioeconomic History   Marital status: Divorced    Spouse name: Not on file   Number of children: Not on file   Years of education: Not on file   Highest education level: Associate degree: occupational, scientist, product/process development, or vocational program  Occupational History   Not on file  Tobacco Use   Smoking status: Never   Smokeless tobacco: Never  Vaping Use   Vaping status: Never Used  Substance and  Sexual Activity   Alcohol use: No    Alcohol/week: 0.0 standard drinks of alcohol   Drug use: No   Sexual activity: Not Currently  Other Topics Concern   Not on file  Social History Narrative   Not on file   Social Drivers of Health   Tobacco Use: Low Risk (05/17/2024)   Patient History    Smoking Tobacco Use: Never    Smokeless Tobacco Use: Never    Passive Exposure: Not on file  Financial Resource Strain: Low Risk (03/22/2024)   Overall Financial Resource Strain (CARDIA)    Difficulty of Paying Living Expenses: Not very hard  Recent Concern: Financial Resource Strain - Medium Risk (01/24/2024)   Overall Financial Resource Strain (CARDIA)     Difficulty of Paying Living Expenses: Somewhat hard  Food Insecurity: No Food Insecurity (03/22/2024)   Epic    Worried About Radiation Protection Practitioner of Food in the Last Year: Never true    Ran Out of Food in the Last Year: Never true  Transportation Needs: No Transportation Needs (03/22/2024)   Epic    Lack of Transportation (Medical): No    Lack of Transportation (Non-Medical): No  Physical Activity: Inactive (03/22/2024)   Exercise Vital Sign    Days of Exercise per Week: 0 days    Minutes of Exercise per Session: Not on file  Stress: Stress Concern Present (03/22/2024)   [employer] of Occupational Health - Occupational Stress Questionnaire    Feeling of Stress: To some extent  Social Connections: Socially Isolated (03/22/2024)   Social Connection and Isolation Panel    Frequency of Communication with Friends and Family: Twice a week    Frequency of Social Gatherings with Friends and Family: Once a week    Attends Religious Services: Never    Database Administrator or Organizations: No    Attends Engineer, Structural: Not on file    Marital Status: Divorced  Depression (PHQ2-9): Medium Risk (05/12/2024)   Depression (PHQ2-9)    PHQ-2 Score: 5  Alcohol Screen: Low Risk (01/16/2024)   Alcohol Screen    Last Alcohol Screening Score (AUDIT): 0  Housing: Unknown (04/01/2024)   Received from [facility]   Epic    Unable to Pay for Housing in the Last Year: Not on file    Number of Times Moved in the Last Year: Not on file    At any time in the past 12 months, were you homeless or living in a shelter (including now)?: No  Utilities: Not At Risk (01/16/2024)   Epic    Threatened with loss of utilities: No  Health Literacy: Adequate Health Literacy (01/16/2024)   B1300 Health Literacy    Frequency of need for help with medical instructions: Never     Review of Systems  Constitutional:  Negative for appetite change and fever.  HENT:  Negative for  congestion and sinus pressure.   Respiratory:  Negative for cough, chest tightness and shortness of breath.   Cardiovascular:  Negative for chest pain, palpitations and leg swelling.  Gastrointestinal:  Negative for abdominal pain, diarrhea, nausea and vomiting.  Genitourinary:  Negative for difficulty urinating and dysuria.  Musculoskeletal:        Shoulder pain as outlined.   Skin:  Negative for color change and rash.  Neurological:  Negative for dizziness and headaches.  Psychiatric/Behavioral:  Negative for agitation and dysphoric mood.        Objective:     BP 132/82  Pulse 81   Temp 98.3 F (36.8 C) (Oral)   Ht 5' 7 (1.702 m)   Wt 214 lb (97.1 kg)   SpO2 97%   BMI 33.52 kg/m  Wt Readings from Last 3 Encounters:  05/12/24 214 lb (97.1 kg)  04/18/24 223 lb (101.2 kg)  04/01/24 222 lb 3.2 oz (100.8 kg)    Physical Exam Vitals reviewed.  Constitutional:      General: She is not in acute distress.    Appearance: Normal appearance.  HENT:     Head: Normocephalic and atraumatic.     Right Ear: External ear normal.     Left Ear: External ear normal.     Mouth/Throat:     Pharynx: No oropharyngeal exudate or posterior oropharyngeal erythema.  Eyes:     General: No scleral icterus.       Right eye: No discharge.        Left eye: No discharge.     Conjunctiva/sclera: Conjunctivae normal.  Neck:     Thyroid : No thyromegaly.  Cardiovascular:     Rate and Rhythm: Normal rate and regular rhythm.  Pulmonary:     Effort: No respiratory distress.     Breath sounds: Normal breath sounds. No wheezing.  Abdominal:     General: Bowel sounds are normal.     Palpations: Abdomen is soft.     Tenderness: There is no abdominal tenderness.  Musculoskeletal:     Cervical back: Neck supple. No tenderness.     Comments: Increased pain with attempts with abduction and certain position changes.   Lymphadenopathy:     Cervical: No cervical adenopathy.  Skin:    Findings: No  erythema or rash.  Neurological:     Mental Status: She is alert.  Psychiatric:        Mood and Affect: Mood normal.        Behavior: Behavior normal.         Outpatient Encounter Medications as of 05/12/2024  Medication Sig   albuterol  (PROVENTIL ) (2.5 MG/3ML) 0.083% nebulizer solution Take 3 mLs (2.5 mg total) by nebulization every 4 (four) hours as needed for wheezing or shortness of breath.   albuterol  (VENTOLIN  HFA) 108 (90 Base) MCG/ACT inhaler Inhale 2 puffs into the lungs every 6 (six) hours as needed for wheezing or shortness of breath.   esomeprazole  (NEXIUM ) 40 MG capsule Take 1 capsule (40 mg total) by mouth daily.   fluticasone -salmeterol (ADVAIR) 250-50 MCG/ACT AEPB INHALE 1 PUFF TWICE A DAY AS DIRECTED **RINSE MOUTH AFTER USE** (MORNING AND AT BEDTIME)   losartan  (COZAAR ) 25 MG tablet TAKE 1 TABLET(25 MG) BY MOUTH DAILY   methylPREDNISolone  (MEDROL  DOSEPAK) 4 MG TBPK tablet Medrol  dose pak - 6 day taper   rosuvastatin  (CRESTOR ) 10 MG tablet Take 1 tablet (10 mg total) by mouth daily.   sertraline  (ZOLOFT ) 25 MG tablet Take 1 1/2 tablet per day.   [DISCONTINUED] cyclobenzaprine  (FLEXERIL ) 5 MG tablet Take 1 tablet (5 mg total) by mouth at bedtime.   [DISCONTINUED] Ferrous Sulfate  (IRON  PO) Take by mouth daily. Gummy   [DISCONTINUED] hydrochlorothiazide  (HYDRODIURIL ) 25 MG tablet Take 25 mg by mouth daily. (Patient taking differently: Take 12.5 mg by mouth daily.)   cyclobenzaprine  (FLEXERIL ) 5 MG tablet Take 1 tablet (5 mg total) by mouth at bedtime as needed for muscle spasms.   [DISCONTINUED] predniSONE  (STERAPRED UNI-PAK 21 TAB) 10 MG (21) TBPK tablet Use as directed.   [DISCONTINUED] Tiotropium Bromide (SPIRIVA  RESPIMAT) 1.25 MCG/ACT AERS  Inhale 2 puffs into the lungs daily. (Patient not taking: Reported on 05/12/2024)   [DISCONTINUED] VITAMIN D -VITAMIN K PO Take by mouth. (Patient not taking: Reported on 05/12/2024)   No facility-administered encounter medications on file  as of 05/12/2024.     Lab Results  Component Value Date   WBC 7.9 03/25/2024   HGB 11.5 (L) 03/25/2024   HCT 33.7 (L) 03/25/2024   PLT 311.0 03/25/2024   GLUCOSE 120 (H) 04/14/2024   CHOL 103 04/14/2024   TRIG 77.0 04/14/2024   HDL 40.00 04/14/2024   LDLCALC 47 04/14/2024   ALT 10 04/14/2024   AST 14 04/14/2024   NA 143 04/14/2024   K 4.3 04/14/2024   CL 105 04/14/2024   CREATININE 1.02 04/14/2024   BUN 15 04/14/2024   CO2 29 04/14/2024   TSH 1.72 04/23/2023   HGBA1C 6.4 04/14/2024    DG ABD ACUTE 2+V W 1V CHEST Result Date: 10/21/2023 CLINICAL DATA:  SBO EXAM: DG ABDOMEN ACUTE WITH 1 VIEW CHEST COMPARISON:  10/20/2023. FINDINGS: There is no evidence of dilated bowel loops or free intraperitoneal air. NG tube tip in the midline upper abdomen below the diaphragm. No radiopaque calculi or other significant radiographic abnormality is seen. Heart size and mediastinal contours are within normal limits. Both lungs are clear. IMPRESSION: Negative abdominal radiographs.  No acute cardiopulmonary disease. Electronically Signed   By: Fonda Field M.D.   On: 10/21/2023 10:42   DG Abd Portable 1 View Result Date: 10/20/2023 CLINICAL DATA:  Enteric tube placement. EXAM: PORTABLE ABDOMEN - 1 VIEW COMPARISON:  Abdomen 02/12/2005 and chest x-ray 04/13/2023 FINDINGS: Enteric tube is present with tip over the stomach in the left upper quadrant just below the expected region of the gastroesophageal junction. Side port appears just above the expected region of the gastroesophageal junction as this could be advanced proximally 5 cm. Mild prominence of the infrahilar vessels which may be due to mild vascular congestion. Upper abdomen demonstrates nonobstructive bowel gas pattern. No free peritoneal air. IMPRESSION: Enteric tube with tip over the stomach in the left upper quadrant just below the expected region of the gastroesophageal junction. Side port appears just above the expected region of the  gastroesophageal junction as this could be advanced proximally 5 cm. Electronically Signed   By: Toribio Agreste M.D.   On: 10/20/2023 12:51   CT ABDOMEN PELVIS WO CONTRAST Result Date: 10/20/2023 CLINICAL DATA:  Acute abdominal pain. EXAM: CT ABDOMEN AND PELVIS WITHOUT CONTRAST TECHNIQUE: Multidetector CT imaging of the abdomen and pelvis was performed following the standard protocol without IV contrast. RADIATION DOSE REDUCTION: This exam was performed according to the departmental dose-optimization program which includes automated exposure control, adjustment of the mA and/or kV according to patient size and/or use of iterative reconstruction technique. COMPARISON:  08/27/2007 FINDINGS: Lower chest: No acute findings. Hepatobiliary: No mass visualized on this unenhanced exam. A few small hepatic cysts are again noted. A few tiny gallstones are seen, but without signs of cholecystitis or biliary ductal dilatation. Pancreas: No mass or inflammatory process visualized on this unenhanced exam. Spleen:  Within normal limits in size. Adrenals/Urinary tract: No evidence of urolithiasis or hydronephrosis. Unremarkable unopacified urinary bladder. Stomach/Bowel: Moderate hiatal hernia is seen. Moderately dilated fluid-filled small bowel loops are seen with transition point seen distally in the right lower quadrant, suspicious for adhesion. No mass or focal inflammatory process identified. Tiny amount of perihepatic and pelvic ascites noted. Vascular/Lymphatic: No pathologically enlarged lymph nodes identified. No evidence of  abdominal aortic aneurysm. Reproductive:  No mass or other significant abnormality. Other:  None. Musculoskeletal:  No suspicious bone lesions identified. IMPRESSION: Distal small bowel obstruction, with transition point in right lower quadrant suspicious for adhesion. Tiny amount of ascites. Moderate hiatal hernia. Cholelithiasis. No radiographic evidence of cholecystitis. Electronically Signed    By: Norleen DELENA Kil M.D.   On: 10/20/2023 10:57       Assessment & Plan:  Left shoulder pain, unspecified chronicity Assessment & Plan: Left shoulder pain as outlined. Appears to be more msk - on exam. Treat with medrol  dose pak. Refill rx for flexeril . Check xray. Further w/up pending results.   Orders: -     DG Shoulder Left; Future  Chronic cough Assessment & Plan: Has a history of cough variant asthma. Continue advair. Increased shoulder pain as outined. Some increased pain with coughing. Lungs appear to be clear. Check cxr.   Orders: -     DG Chest 2 View; Future  Acute pain of left shoulder Assessment & Plan: Left shoulder pain as outlined. Appears to be more msk - on exam. Treat with medrol  dose pak. Refill rx for flexeril . Check xray. Further w/up pending results.   Orders: -     Cyclobenzaprine  HCl; Take 1 tablet (5 mg total) by mouth at bedtime as needed for muscle spasms.  Dispense: 20 tablet; Refill: 0  Vitamin D  deficiency Assessment & Plan: Check vitamin D  level with next labs.    Major depressive disorder, single episode, mild Assessment & Plan: Continues on zoloft . Oveall appears to be stable. Follow.    Iron  deficiency anemia, unspecified iron  deficiency anemia type Assessment & Plan:  Saw Dr Jacobo - 09/05/22 - anemia.  Suspect IDA. Colonoscopy and EGD in October 2023 did not reveal any significant pathology. Patient could not tolerate video endoscopy. S/p IV venofer .  Last check - hgb and iron  studies wnl. Recommended f/u. Continue to follow cbc and iron  studies.    Hyperglycemia Assessment & Plan: Low carb diet and exercise. Follow met b and A1c.    Hypercholesteremia Assessment & Plan: Continue crestor .  Low cholesterol diet and exercise.  Follow lipid panel.  Lab Results  Component Value Date   CHOL 103 04/14/2024   HDL 40.00 04/14/2024   LDLCALC 47 04/14/2024   TRIG 77.0 04/14/2024   CHOLHDL 3 04/14/2024      Gastroesophageal reflux  disease, unspecified whether esophagitis present Assessment & Plan: No upper symptoms reported. Continue nexium .    Essential hypertension, benign Assessment & Plan: On hydrochlorothiazide  12.5mg  q day and losartan . Blood pressure as outlined. Follow pressures. Follow metabolic panel.    Cough variant asthma Assessment & Plan: Continue advair. Breathing stable.    Other orders -     methylPREDNISolone ; Medrol  dose pak - 6 day taper  Dispense: 18 tablet; Refill: 0     Allena Hamilton, MD "

## 2024-05-15 NOTE — Telephone Encounter (Signed)
 Order placed for ortho referral.

## 2024-05-15 NOTE — Addendum Note (Signed)
 Addended by: GLENDIA ALLENA RAMAN on: 05/15/2024 03:19 AM   Modules accepted: Orders

## 2024-05-15 NOTE — Telephone Encounter (Signed)
Order placed for CT chest.  °

## 2024-05-17 ENCOUNTER — Encounter: Payer: Self-pay | Admitting: Internal Medicine

## 2024-05-17 NOTE — Assessment & Plan Note (Signed)
 Low-carb diet and exercise.  Follow met b and A1c.

## 2024-05-17 NOTE — Assessment & Plan Note (Signed)
 On hydrochlorothiazide  12.5mg  q day and losartan . Blood pressure as outlined. Follow pressures. Follow metabolic panel.

## 2024-05-17 NOTE — Assessment & Plan Note (Signed)
 Has a history of cough variant asthma. Continue advair. Increased shoulder pain as outined. Some increased pain with coughing. Lungs appear to be clear. Check cxr.

## 2024-05-17 NOTE — Assessment & Plan Note (Signed)
No upper symptoms reported.  Continue nexium.  

## 2024-05-17 NOTE — Assessment & Plan Note (Signed)
 Continues on zoloft . Oveall appears to be stable. Follow.

## 2024-05-17 NOTE — Assessment & Plan Note (Signed)
 Left shoulder pain as outlined. Appears to be more msk - on exam. Treat with medrol  dose pak. Refill rx for flexeril . Check xray. Further w/up pending results.

## 2024-05-17 NOTE — Assessment & Plan Note (Signed)
 Continue crestor .  Low cholesterol diet and exercise.  Follow lipid panel.  Lab Results  Component Value Date   CHOL 103 04/14/2024   HDL 40.00 04/14/2024   LDLCALC 47 04/14/2024   TRIG 77.0 04/14/2024   CHOLHDL 3 04/14/2024

## 2024-05-17 NOTE — Assessment & Plan Note (Signed)
 Check vitamin D level with next labs.  ?

## 2024-05-17 NOTE — Assessment & Plan Note (Signed)
Continue advair.  Breathing stable.

## 2024-05-17 NOTE — Assessment & Plan Note (Signed)
 Saw Dr Jacobo - 09/05/22 - anemia.  Suspect IDA. Colonoscopy and EGD in October 2023 did not reveal any significant pathology. Patient could not tolerate video endoscopy. S/p IV venofer .  Last check - hgb and iron  studies wnl. Recommended f/u. Continue to follow cbc and iron  studies.

## 2024-05-22 ENCOUNTER — Ambulatory Visit
Admission: RE | Admit: 2024-05-22 | Discharge: 2024-05-22 | Disposition: A | Discharge status: Home / Self Care | Source: Ambulatory Visit | Attending: Internal Medicine | Admitting: Internal Medicine

## 2024-05-22 DIAGNOSIS — M25512 Pain in left shoulder: Secondary | ICD-10-CM | POA: Diagnosis present

## 2024-05-22 DIAGNOSIS — R079 Chest pain, unspecified: Secondary | ICD-10-CM | POA: Diagnosis present

## 2024-05-23 ENCOUNTER — Telehealth: Payer: Self-pay

## 2024-05-23 NOTE — Telephone Encounter (Signed)
 Copied from CRM [id number]. Topic: Clinical - Medication Question >> May 23, 2024  8:22 AM Larissa RAMAN wrote: Reason for CRM: Patient requesting an alternative medication for cyclobenzaprine  (FLEXERIL ) 5 MG tablet. She states it is making her sleepy.

## 2024-05-23 NOTE — Telephone Encounter (Signed)
 Called and spoke to Susan Miller. Will hold on muscle relaxer at this time. Reports increased fatigue. Will hold on sending in another MR. Will see if symptoms resolve with stopping MR. Discussed her breathing, cough. Still not feeling like getting in a good breathing. She feels may be some better. Discussed evaluation. She does not feel needs to be seen now. Call with update Monday. If any change or worsening problems, needs to be evaluated.

## 2024-05-26 ENCOUNTER — Observation Stay
Admission: EM | Admit: 2024-05-26 | Discharge: 2024-05-28 | Disposition: A | Attending: Emergency Medicine | Admitting: Emergency Medicine

## 2024-05-26 ENCOUNTER — Ambulatory Visit: Payer: Self-pay | Admitting: Internal Medicine

## 2024-05-26 ENCOUNTER — Inpatient Hospital Stay: Admit: 2024-05-26 | Admitting: Internal Medicine

## 2024-05-26 ENCOUNTER — Other Ambulatory Visit: Payer: Self-pay

## 2024-05-26 ENCOUNTER — Encounter (HOSPITAL_COMMUNITY): Payer: Self-pay

## 2024-05-26 ENCOUNTER — Emergency Department

## 2024-05-26 DIAGNOSIS — J45909 Unspecified asthma, uncomplicated: Secondary | ICD-10-CM | POA: Diagnosis not present

## 2024-05-26 DIAGNOSIS — K219 Gastro-esophageal reflux disease without esophagitis: Secondary | ICD-10-CM | POA: Diagnosis not present

## 2024-05-26 DIAGNOSIS — Z79899 Other long term (current) drug therapy: Secondary | ICD-10-CM | POA: Diagnosis not present

## 2024-05-26 DIAGNOSIS — I4891 Unspecified atrial fibrillation: Principal | ICD-10-CM | POA: Diagnosis present

## 2024-05-26 DIAGNOSIS — R0609 Other forms of dyspnea: Secondary | ICD-10-CM | POA: Diagnosis not present

## 2024-05-26 DIAGNOSIS — R0602 Shortness of breath: Secondary | ICD-10-CM | POA: Diagnosis present

## 2024-05-26 DIAGNOSIS — I3139 Other pericardial effusion (noninflammatory): Principal | ICD-10-CM

## 2024-05-26 DIAGNOSIS — E785 Hyperlipidemia, unspecified: Secondary | ICD-10-CM | POA: Diagnosis present

## 2024-05-26 DIAGNOSIS — I10 Essential (primary) hypertension: Secondary | ICD-10-CM | POA: Diagnosis present

## 2024-05-26 LAB — BASIC METABOLIC PANEL WITH GFR
Anion gap: 13 (ref 5–15)
BUN: 27 mg/dL — ABNORMAL HIGH (ref 8–23)
CO2: 23 mmol/L (ref 22–32)
Calcium: 9.4 mg/dL (ref 8.9–10.3)
Chloride: 99 mmol/L (ref 98–111)
Creatinine, Ser: 1.37 mg/dL — ABNORMAL HIGH (ref 0.44–1.00)
GFR, Estimated: 39 mL/min — ABNORMAL LOW
Glucose, Bld: 154 mg/dL — ABNORMAL HIGH (ref 70–99)
Potassium: 4.3 mmol/L (ref 3.5–5.1)
Sodium: 135 mmol/L (ref 135–145)

## 2024-05-26 LAB — PRO BRAIN NATRIURETIC PEPTIDE: Pro Brain Natriuretic Peptide: 723 pg/mL — ABNORMAL HIGH

## 2024-05-26 LAB — CBC WITH DIFFERENTIAL/PLATELET
Abs Immature Granulocytes: 0.08 K/uL — ABNORMAL HIGH (ref 0.00–0.07)
Basophils Absolute: 0 K/uL (ref 0.0–0.1)
Basophils Relative: 0 %
Eosinophils Absolute: 0.1 K/uL (ref 0.0–0.5)
Eosinophils Relative: 1 %
HCT: 32.7 % — ABNORMAL LOW (ref 36.0–46.0)
Hemoglobin: 10.8 g/dL — ABNORMAL LOW (ref 12.0–15.0)
Immature Granulocytes: 1 %
Lymphocytes Relative: 11 %
Lymphs Abs: 1.7 K/uL (ref 0.7–4.0)
MCH: 28.8 pg (ref 26.0–34.0)
MCHC: 33 g/dL (ref 30.0–36.0)
MCV: 87.2 fL (ref 80.0–100.0)
Monocytes Absolute: 1.1 K/uL — ABNORMAL HIGH (ref 0.1–1.0)
Monocytes Relative: 7 %
Neutro Abs: 11.9 K/uL — ABNORMAL HIGH (ref 1.7–7.7)
Neutrophils Relative %: 80 %
Platelets: 423 K/uL — ABNORMAL HIGH (ref 150–400)
RBC: 3.75 MIL/uL — ABNORMAL LOW (ref 3.87–5.11)
RDW: 13.3 % (ref 11.5–15.5)
WBC: 14.8 K/uL — ABNORMAL HIGH (ref 4.0–10.5)
nRBC: 0 % (ref 0.0–0.2)

## 2024-05-26 LAB — TROPONIN T, HIGH SENSITIVITY
Troponin T High Sensitivity: 20 ng/L — ABNORMAL HIGH (ref 0–19)
Troponin T High Sensitivity: 26 ng/L — ABNORMAL HIGH (ref 0–19)

## 2024-05-26 MED ORDER — LACTATED RINGERS IV BOLUS
1000.0000 mL | Freq: Once | INTRAVENOUS | Status: AC
  Administered 2024-05-26: 1000 mL via INTRAVENOUS

## 2024-05-26 NOTE — Telephone Encounter (Signed)
 Please let her know we are waiting on scan results. Please call and see if we can get a report.  Also, confirm she is feeling better.

## 2024-05-26 NOTE — Telephone Encounter (Unsigned)
 Copied from CRM #[id number]. Topic: General - Other >> May 26, 2024  8:32 AM Burnard DEL wrote: Reason for CRM: Patient was advised to let provider know how she is feeling today. She stated that she hasn't had a fever,just some night sweats over the weekend. She said that she's still coughing ,and some pain that comes and goes. She stated that she is just waiting  for the CT results. Wanted to let provider know hat she is still here.

## 2024-05-26 NOTE — ED Triage Notes (Signed)
 Pt reports for the past few days she has had shortness of breath and left shoulder pain. Pt went for CT scan last Thursday, pt got results today and was told she had a pleural effusion. Pts PCP sent her here.

## 2024-05-26 NOTE — ED Provider Notes (Signed)
 "  [facility] Provider Note    Event Date/Time   First MD Initiated Contact with Patient 05/26/24 2017     (approximate)   History   Shortness of Breath   HPI  Susan Miller is a 79 y.o. female who presents to the ED for evaluation of Shortness of Breath   I reviewed a PCP visit from 2 weeks ago.  History of GERD, HLD, HTN, asthma  Patient presents to the ED due to abnormal outpatient CT imaging performed by the PCP 4 days ago, reading today.  Noncontrast CT chest with a large pericardial effusion, small bilateral pleural effusions.  Patient reports a chronic nonproductive cough but is had worsening dyspnea on exertion over the past couple weeks.  Denies increased lower extremity swelling or orthopnea.   Physical Exam   Triage Vital Signs: ED Triage Vitals  Encounter Vitals Group     BP 05/26/24 1911 (!) 140/79     Girls Systolic BP Percentile --      Girls Diastolic BP Percentile --      Boys Systolic BP Percentile --      Boys Diastolic BP Percentile --      Pulse Rate 05/26/24 1911 (!) 115     Resp 05/26/24 1911 20     Temp 05/26/24 1911 98.6 F (37 C)     Temp src --      SpO2 05/26/24 1911 97 %     Weight 05/26/24 1909 214 lb (97.1 kg)     Height 05/26/24 1909 5' 7 (1.702 m)     Head Circumference --      Peak Flow --      Pain Score 05/26/24 1909 6     Pain Loc --      Pain Education --      Exclude from Growth Chart --     Most recent vital signs: Vitals:   05/26/24 1911 05/26/24 2130  BP: (!) 140/79 117/73  Pulse: (!) 115 99  Resp: 20   Temp: 98.6 F (37 C)   SpO2: 97%     General: Awake, no distress.  Looks well CV:  Good peripheral perfusion.  Distant heart sounds, tachycardic and regular Resp:  Normal effort.  Abd:  No distention.  MSK:  No deformity noted.  Neuro:  No focal deficits appreciated. Other:     ED Results / Procedures / Treatments   Labs (all labs ordered are listed, but only abnormal results  are displayed) Labs Reviewed  CBC WITH DIFFERENTIAL/PLATELET - Abnormal; Notable for the following components:      Result Value   WBC 14.8 (*)    RBC 3.75 (*)    Hemoglobin 10.8 (*)    HCT 32.7 (*)    Platelets 423 (*)    Neutro Abs 11.9 (*)    Monocytes Absolute 1.1 (*)    Abs Immature Granulocytes 0.08 (*)    All other components within normal limits  BASIC METABOLIC PANEL WITH GFR - Abnormal; Notable for the following components:   Glucose, Bld 154 (*)    BUN 27 (*)    Creatinine, Ser 1.37 (*)    GFR, Estimated 39 (*)    All other components within normal limits  PRO BRAIN NATRIURETIC PEPTIDE - Abnormal; Notable for the following components:   Pro Brain Natriuretic Peptide 723.0 (*)    All other components within normal limits  TROPONIN T, HIGH SENSITIVITY - Abnormal; Notable for the following components:  Troponin T High Sensitivity 20 (*)    All other components within normal limits  TROPONIN T, HIGH SENSITIVITY - Abnormal; Notable for the following components:   Troponin T High Sensitivity 26 (*)    All other components within normal limits    EKG Sinus tachycardia with a rate of 116 bpm.  Normal axis and intervals.  No STEMI  RADIOLOGY Review a noncontrast CT chest obtained 4 days ago, read today with a large pericardial effusion.  Official radiology report(s): DG Chest 2 View Result Date: 05/26/2024 CLINICAL DATA:  Shortness of breath and cough EXAM: CHEST - 2 VIEW COMPARISON:  05/12/2024, CT 05/22/2024 FINDINGS: Cardiomegaly. Small pleural effusions. No focal consolidation or pneumothorax IMPRESSION: Cardiomegaly with small pleural effusions. Electronically Signed   By: Luke Bun M.D.   On: 05/26/2024 22:11    PROCEDURES and INTERVENTIONS:  .Critical Care  Performed by: Claudene Rover, MD Authorized by: Claudene Rover, MD   Critical care provider statement:    Critical care time (minutes):  30   Critical care time was exclusive of:  Separately billable  procedures and treating other patients   Critical care was necessary to treat or prevent imminent or life-threatening deterioration of the following conditions:  Cardiac failure and circulatory failure   Critical care was time spent personally by me on the following activities:  Development of treatment plan with patient or surrogate, discussions with consultants, evaluation of patient's response to treatment, examination of patient, ordering and review of laboratory studies, ordering and review of radiographic studies, ordering and performing treatments and interventions, pulse oximetry, re-evaluation of patient's condition and review of old charts .1-3 Lead EKG Interpretation  Performed by: Claudene Rover, MD Authorized by: Claudene Rover, MD     Interpretation: abnormal     ECG rate:  110   ECG rate assessment: tachycardic     Rhythm: sinus tachycardia     Ectopy: none     Conduction: normal     Medications  lactated ringers  bolus 1,000 mL (1,000 mLs Intravenous New Bag/Given 05/26/24 2141)     IMPRESSION / MDM / ASSESSMENT AND PLAN / ED COURSE  I reviewed the triage vital signs and the nursing notes.  Differential diagnosis includes, but is not limited to, new onset CHF, tamponade physiology, trauma  {Patient presents with symptoms of an acute illness or injury that is potentially life-threatening.  Patient presents to the ED with subacute dyspnea on exertion and outpatient CT chest with a large pericardial effusion.  Looks well here in the ED but notably sinus tachycardic with normal blood pressure and O2 saturations.  Minimal elevation of troponin, flat on repeat.  Mild elevation of BNP and no history of CHF.  Slight worsening of renal dysfunction.  Mild leukocytosis but no clear infectious etiology of her symptoms.  Consult with cardiology who recommends transfer.     Clinical Course as of 05/26/24 2249  Mon May 26, 2024  2124 I consult with Dr. Kennyth. He recommends transfer to  [place]. Allow permissive hypertension.  Provide IV fluids. [DS]  2137 Dr. Franky, accepting [DS]  2153 Update patient and family, they're agreeable [DS]    Clinical Course User Index [DS] Claudene Rover, MD     FINAL CLINICAL IMPRESSION(S) / ED DIAGNOSES   Final diagnoses:  Pericardial effusion  Dyspnea on exertion     Rx / DC Orders   ED Discharge Orders     None        Note:  This  document was prepared using Conservation officer, historic buildings and may include unintentional dictation errors.   Claudene Rover, MD 05/26/24 2250  "

## 2024-05-26 NOTE — ED Notes (Signed)
 Called [facility] about transfer / waiting on call back

## 2024-05-27 ENCOUNTER — Other Ambulatory Visit (HOSPITAL_COMMUNITY): Payer: Self-pay

## 2024-05-27 ENCOUNTER — Telehealth (HOSPITAL_COMMUNITY): Payer: Self-pay

## 2024-05-27 ENCOUNTER — Emergency Department (HOSPITAL_BASED_OUTPATIENT_CLINIC_OR_DEPARTMENT_OTHER)
Admit: 2024-05-27 | Discharge: 2024-05-27 | Disposition: A | Discharge status: Home / Self Care | Attending: Cardiology | Admitting: Cardiology

## 2024-05-27 DIAGNOSIS — I4891 Unspecified atrial fibrillation: Secondary | ICD-10-CM | POA: Diagnosis not present

## 2024-05-27 DIAGNOSIS — I10 Essential (primary) hypertension: Secondary | ICD-10-CM | POA: Diagnosis not present

## 2024-05-27 DIAGNOSIS — I3139 Other pericardial effusion (noninflammatory): Principal | ICD-10-CM

## 2024-05-27 DIAGNOSIS — E785 Hyperlipidemia, unspecified: Secondary | ICD-10-CM

## 2024-05-27 DIAGNOSIS — I480 Paroxysmal atrial fibrillation: Secondary | ICD-10-CM | POA: Diagnosis not present

## 2024-05-27 LAB — ECHOCARDIOGRAM COMPLETE
AR max vel: 3.88 cm2
AV Area VTI: 4.98 cm2
AV Area mean vel: 3.77 cm2
AV Mean grad: 2 mmHg
AV Peak grad: 4.2 mmHg
Ao pk vel: 1.03 m/s
Area-P 1/2: 4.33 cm2
Height: 67 in
S' Lateral: 2.8 cm
Weight: 3424.01 [oz_av]

## 2024-05-27 LAB — APTT: aPTT: 33 s (ref 24–36)

## 2024-05-27 LAB — HEPARIN LEVEL (UNFRACTIONATED)
Heparin Unfractionated: 0.15 [IU]/mL — ABNORMAL LOW (ref 0.30–0.70)
Heparin Unfractionated: <0.1 [IU]/mL — ABNORMAL LOW (ref 0.30–0.70)

## 2024-05-27 LAB — PROTIME-INR
INR: 1.1 (ref 0.8–1.2)
Prothrombin Time: 15.2 s (ref 11.4–15.2)

## 2024-05-27 LAB — TSH: TSH: 2.46 u[IU]/mL (ref 0.350–4.500)

## 2024-05-27 LAB — MAGNESIUM: Magnesium: 2 mg/dL (ref 1.7–2.4)

## 2024-05-27 LAB — SEDIMENTATION RATE: Sed Rate: 79 mm/h — ABNORMAL HIGH (ref 0–30)

## 2024-05-27 LAB — C-REACTIVE PROTEIN: CRP: 20.2 mg/dL — ABNORMAL HIGH

## 2024-05-27 MED ORDER — HEPARIN (PORCINE) 25000 UT/250ML-% IV SOLN
1400.0000 [IU]/h | INTRAVENOUS | Status: DC
  Administered 2024-05-27: 1150 [IU]/h via INTRAVENOUS
  Administered 2024-05-28: 1400 [IU]/h via INTRAVENOUS
  Filled 2024-05-27 (×2): qty 250

## 2024-05-27 MED ORDER — PANTOPRAZOLE SODIUM 40 MG PO TBEC
40.0000 mg | DELAYED_RELEASE_TABLET | Freq: Two times a day (BID) | ORAL | Status: DC
  Administered 2024-05-27 – 2024-05-28 (×2): 40 mg via ORAL
  Filled 2024-05-27 (×3): qty 1

## 2024-05-27 MED ORDER — ACETAMINOPHEN 325 MG PO TABS: 650.0000 mg | ORAL_TABLET | Freq: Four times a day (QID) | ORAL | Status: DC | PRN

## 2024-05-27 MED ORDER — OXYCODONE HCL 5 MG PO TABS: 5.0000 mg | ORAL_TABLET | ORAL | Status: DC | PRN

## 2024-05-27 MED ORDER — ONDANSETRON HCL 4 MG/2ML IJ SOLN: 4.0000 mg | Freq: Four times a day (QID) | INTRAMUSCULAR | Status: DC | PRN

## 2024-05-27 MED ORDER — HYDROCHLOROTHIAZIDE 12.5 MG PO TABS
12.5000 mg | ORAL_TABLET | Freq: Every day | ORAL | Status: DC
  Administered 2024-05-27: 12.5 mg via ORAL
  Filled 2024-05-27: qty 1

## 2024-05-27 MED ORDER — ROSUVASTATIN CALCIUM 10 MG PO TABS
10.0000 mg | ORAL_TABLET | Freq: Every day | ORAL | Status: DC
  Administered 2024-05-27 – 2024-05-28 (×2): 10 mg via ORAL
  Filled 2024-05-27 (×2): qty 1

## 2024-05-27 MED ORDER — ACETAMINOPHEN 650 MG RE SUPP: 650.0000 mg | Freq: Four times a day (QID) | RECTAL | Status: DC | PRN

## 2024-05-27 MED ORDER — HEPARIN BOLUS VIA INFUSION
4000.0000 [IU] | Freq: Once | INTRAVENOUS | Status: AC
  Administered 2024-05-27: 4000 [IU] via INTRAVENOUS
  Filled 2024-05-27: qty 4000

## 2024-05-27 MED ORDER — LOSARTAN POTASSIUM 50 MG PO TABS: 25.0000 mg | ORAL_TABLET | Freq: Every day | ORAL | Status: DC

## 2024-05-27 MED ORDER — ONDANSETRON HCL 4 MG PO TABS: 4.0000 mg | ORAL_TABLET | Freq: Four times a day (QID) | ORAL | Status: DC | PRN

## 2024-05-27 MED ORDER — DILTIAZEM HCL-DEXTROSE 125-5 MG/125ML-% IV SOLN (PREMIX)
5.0000 mg/h | INTRAVENOUS | Status: DC
  Filled 2024-05-27: qty 125

## 2024-05-27 MED ORDER — HEPARIN BOLUS VIA INFUSION
2500.0000 [IU] | Freq: Once | INTRAVENOUS | Status: AC
  Administered 2024-05-27: 2500 [IU] via INTRAVENOUS
  Filled 2024-05-27: qty 2500

## 2024-05-27 MED ORDER — FENTANYL CITRATE (PF) 50 MCG/ML IJ SOSY: 12.5000 ug | PREFILLED_SYRINGE | INTRAMUSCULAR | Status: DC | PRN

## 2024-05-27 MED ORDER — ALBUTEROL SULFATE (2.5 MG/3ML) 0.083% IN NEBU: 2.5000 mg | INHALATION_SOLUTION | RESPIRATORY_TRACT | Status: DC | PRN

## 2024-05-27 MED ORDER — METOPROLOL TARTRATE 25 MG PO TABS
25.0000 mg | ORAL_TABLET | Freq: Two times a day (BID) | ORAL | Status: DC
  Administered 2024-05-27 – 2024-05-28 (×3): 25 mg via ORAL
  Filled 2024-05-27 (×3): qty 1

## 2024-05-27 MED ORDER — COLCHICINE 0.6 MG PO TABS
0.6000 mg | ORAL_TABLET | Freq: Every day | ORAL | Status: DC
  Administered 2024-05-28 (×2): 0.6 mg via ORAL
  Filled 2024-05-27 (×2): qty 1

## 2024-05-27 MED ORDER — POLYETHYLENE GLYCOL 3350 17 G PO PACK: 17.0000 g | PACK | Freq: Every day | ORAL | Status: DC | PRN

## 2024-05-27 NOTE — ED Notes (Signed)
 Called [facility] per Dr. Dorothyann to cancel transfer to [facility], [phone]

## 2024-05-27 NOTE — Progress Notes (Addendum)
 PHARMACY - ANTICOAGULATION CONSULT NOTE  Pharmacy Consult for Heparin  Indication: atrial fibrillation  Allergies[1]  Patient Measurements: Height: 5' 7 (170.2 cm) Weight: 97.1 kg (214 lb) IBW/kg (Calculated) : 61.6 HEPARIN  DW (KG): 83  Vital Signs: BP: 123/76 (01/20 0830) Pulse Rate: 94 (01/20 0830)  Labs: Recent Labs    05/26/24 1912  HGB 10.8*  HCT 32.7*  PLT 423*  CREATININE 1.37*    Estimated Creatinine Clearance: 40.5 mL/min (A) (by C-G formula based on SCr of 1.37 mg/dL (H)).   Medical History: Past Medical History:  Diagnosis Date   Allergy    Anxiety 1970   i think   Asthma    Cancer (HCC) 2025   Cataract 2009   i think   Depression    GERD (gastroesophageal reflux disease) 2015   i think   History of chicken pox    HLD (hyperlipidemia)    Hx: UTI (urinary tract infection)    Hypertension    Neuromuscular disorder (HCC)    Sleep apnea     Medications:  (Not in a hospital admission)  Scheduled:   metoprolol  tartrate  25 mg Oral BID   Infusions:  PRN:  Anti-infectives (From admission, onward)    None       Assessment: Patient is a 79 yo F who presented to the ED after being sent in by her PCP due to concern for a large pericardial effusion seen on outpatient CT. Patient also reports a chronic nonproductive cough and SOB over the past few days. PMH significant for HTN, HLD, GERD, and asthma, with no prior cardiac hx. Upon presentation, an ECHO was obtained and was only notable for a small pericardial effusion. However, during her ED course, patient went into Afib. She is not on any anticoagulation at home. Pharmacy is consulted for heparin  dosing and monitoring.  CHADSVASc score of 4  Baseline CBC w/ Hgb 10.8 and PLTs 423 Baseline aPTT and INR in process    Goal of Therapy:  Heparin  level 0.3-0.7 units/ml Monitor platelets by anticoagulation protocol: Yes   Plan:  - Will order Heparin  4000 units IV bolus x 1 - Will start Heparin  gtt  rate at 1150 units/hr - Will check HL in 8 hrs from infusion start - Will monitor CBC daily     Ransom Blanch PGY-1 Pharmacy Resident  [facility]  05/27/2024 11:58 AM      [1]  Allergies Allergen Reactions   Codeine Hives   Iodinated Contrast Media Swelling

## 2024-05-27 NOTE — Telephone Encounter (Signed)
 Pharmacy Patient Advocate Encounter  Insurance verification completed.    The patient is insured through [employer]/ [employer]. Patient has Medicare and is not eligible for a copay card, but may be able to apply for patient assistance or Medicare RX Payment Plan (Patient Must reach out to their plan, if eligible for payment plan), if available.    Ran test claim for Eliquis  5mg  tablet and the current 30 day co-pay is $49.73.   This test claim was processed through [facility]- copay amounts may vary at other pharmacies due to pharmacy/plan contracts, or as the patient moves through the different stages of their insurance plan.

## 2024-05-27 NOTE — Progress Notes (Signed)
*  PRELIMINARY RESULTS* Echocardiogram 2D Echocardiogram has been performed.  Susan Miller 05/27/2024, 8:01 AM

## 2024-05-27 NOTE — H&P (Signed)
 "  History and Physical    Susan Miller FMW:[id number] DOB: 1945-09-28 DOA: 05/26/2024  DOS: the patient was seen and examined on 05/26/2024  PCP: Glendia Shad, MD   Patient coming from: Home  I have personally briefly reviewed patient's old medical records in [facility]  Chief Complaint: Cough for a month  HPI: Susan Miller is a pleasant 79 y.o. female with medical history significant for GERD, HLD, HTN, asthma with chronic cough who had outpatient CT scan by PCP 4 days ago, result was available as today which showed large pericardial effusion and bilateral pleural effusions.  Patient was called in by primary care and advised to go to the emergency room for evaluation.  Besides chronic cough which has been there for at least more than a month, patient denied any symptoms of chest pain, shortness of breath, palpitations, fever, chills, dysuria, hematuria.  ED Course: Upon arrival to the ED, patient is found to be tachycardic at 115, blood pressure was 140/79 afebrile at 98.6 saturating 97%, leukocytosis at 14.8, troponin 20/26, proBNP 723, BUN 27, creatinine 1.37, EKG showed sinus tachycardia at 116 bpm, chest x-ray showed cardiomegaly, small pleural effusion no focal consolidation.  Outpatient CT scan showed large pericardial effusion.  Patient was being planned for transfer to [facility] for possible pericardial tamponade.  While patient was awaiting to get transferred, echo result was available this morning and cardiologist Dr. Mady who reviewed the echo result and advised that patient does not have large pericardial effusion or tamponade and does not need transfer.  Patient however had A-fib with RVR.  Started on Cardizem  drip and heparin  drip and hospitalist service was consulted for evaluation for admission.  Review of Systems:  ROS  All other systems negative except as noted in the HPI.  Past Medical History:  Diagnosis Date   Allergy    Anxiety 1970   i think   Asthma     Cancer (HCC) 2025   Cataract 2009   i think   Depression    GERD (gastroesophageal reflux disease) 2015   i think   History of chicken pox    HLD (hyperlipidemia)    Hx: UTI (urinary tract infection)    Hypertension    Neuromuscular disorder (HCC)    Sleep apnea     Past Surgical History:  Procedure Laterality Date   COLONOSCOPY WITH PROPOFOL  N/A 06/20/2016   Procedure: COLONOSCOPY WITH PROPOFOL ;  Surgeon: Rogelia Copping, MD;  Location: [facility] ENDOSCOPY;  Service: Endoscopy;  Laterality: N/A;   COLONOSCOPY WITH PROPOFOL  N/A 02/14/2022   Procedure: COLONOSCOPY WITH PROPOFOL ;  Surgeon: Copping Rogelia, MD;  Location: [facility] ENDOSCOPY;  Service: Endoscopy;  Laterality: N/A;   ESOPHAGOGASTRODUODENOSCOPY N/A 02/14/2022   Procedure: ESOPHAGOGASTRODUODENOSCOPY (EGD);  Surgeon: Copping Rogelia, MD;  Location: [facility] ENDOSCOPY;  Service: Endoscopy;  Laterality: N/A;   ESOPHAGOGASTRODUODENOSCOPY (EGD) WITH PROPOFOL  N/A 06/20/2016   Procedure: ESOPHAGOGASTRODUODENOSCOPY (EGD) WITH PROPOFOL ;  Surgeon: Rogelia Copping, MD;  Location: [facility] ENDOSCOPY;  Service: Endoscopy;  Laterality: N/A;   TONSILLECTOMY     as a child   TUMOR REMOVAL     behind ear     reports that she has never smoked. She has never used smokeless tobacco. She reports that she does not drink alcohol and does not use drugs.  Allergies[1]  Family History  Problem Relation Age of Onset   Hypertension Mother    Cancer Sister        colon cancer   Hypertension Sister  Diabetes Sister    Hypertension Brother    Hypertension Maternal Aunt    Breast cancer Maternal Aunt 60   Hypertension Maternal Uncle    Stroke Maternal Grandmother    Hypertension Maternal Grandmother    Arthritis Paternal Grandmother    COPD Brother    Hypertension Brother    ADD / ADHD Son    Alcohol abuse Son    Birth defects Son    Intellectual disability Son    Learning disabilities Son     Prior to Admission medications  Medication Sig Start Date End Date  Taking? Authorizing Provider  albuterol  (PROVENTIL ) (2.5 MG/3ML) 0.083% nebulizer solution Take 3 mLs (2.5 mg total) by nebulization every 4 (four) hours as needed for wheezing or shortness of breath. 07/04/23 07/03/24  Tamea Dedra CROME, MD  albuterol  (VENTOLIN  HFA) 108 (90 Base) MCG/ACT inhaler Inhale 2 puffs into the lungs every 6 (six) hours as needed for wheezing or shortness of breath. 04/01/24   Tamea Dedra CROME, MD  cyclobenzaprine  (FLEXERIL ) 5 MG tablet Take 1 tablet (5 mg total) by mouth at bedtime as needed for muscle spasms. 05/12/24   Glendia Shad, MD  esomeprazole  (NEXIUM ) 40 MG capsule Take 1 capsule (40 mg total) by mouth daily. 03/25/24   Glendia Shad, MD  fluticasone -salmeterol (ADVAIR) 250-50 MCG/ACT AEPB INHALE 1 PUFF TWICE A DAY AS DIRECTED **RINSE MOUTH AFTER USE** (MORNING AND AT BEDTIME) 03/12/24   Tamea Dedra CROME, MD  hydrochlorothiazide  (HYDRODIURIL ) 25 MG tablet Take 0.5 tablets (12.5 mg total) by mouth daily. 05/14/24   Glendia Shad, MD  losartan  (COZAAR ) 25 MG tablet TAKE 1 TABLET(25 MG) BY MOUTH DAILY 03/25/24   Glendia Shad, MD  methylPREDNISolone  (MEDROL  DOSEPAK) 4 MG TBPK tablet Medrol  dose pak - 6 day taper 05/12/24   Glendia Shad, MD  rosuvastatin  (CRESTOR ) 10 MG tablet Take 1 tablet (10 mg total) by mouth daily. 03/25/24   Glendia Shad, MD  sertraline  (ZOLOFT ) 25 MG tablet Take 1 1/2 tablet per day. 03/25/24   Glendia Shad, MD    Physical Exam: Vitals:   05/27/24 0730 05/27/24 0830 05/27/24 0900 05/27/24 0930  BP: 128/80 123/76 113/75 122/62  Pulse: (!) 133 94 100 (!) 107  Resp: (!) 25 19 (!) 21 (!) 23  Temp:      SpO2: 95% 98%    Weight:      Height:        Physical Exam   Constitutional: Alert, awake, calm, comfortable HEENT: Neck supple Respiratory: Clear to auscultation B/L, no wheezing, no rales.  Cardiovascular: Regular rate and rhythm, no murmurs / rubs / gallops. No extremity edema. 2+ pedal pulses. No carotid bruits.   Abdomen: Soft, no tenderness, Bowel sounds positive.  Musculoskeletal: no clubbing / cyanosis. Good ROM, no contractures. Normal muscle tone.  Skin: no rashes, lesions, ulcers. Neurologic: CN 2-12 grossly intact. Sensation intact, No focal deficit identified Psychiatric: Alert and oriented x 3. Normal mood.    Labs on Admission: I have personally reviewed following labs and imaging studies  CBC: Recent Labs  Lab 05/26/24 1912  WBC 14.8*  NEUTROABS 11.9*  HGB 10.8*  HCT 32.7*  MCV 87.2  PLT 423*   Basic Metabolic Panel: Recent Labs  Lab 05/26/24 1912  NA 135  K 4.3  CL 99  CO2 23  GLUCOSE 154*  BUN 27*  CREATININE 1.37*  CALCIUM  9.4   GFR: Estimated Creatinine Clearance: 40.5 mL/min (A) (by C-G formula based on SCr of 1.37 mg/dL (H)).  Liver Function Tests: No results for input(s): AST, ALT, ALKPHOS, BILITOT, PROT, ALBUMIN  in the last 168 hours. No results for input(s): LIPASE, AMYLASE in the last 168 hours. No results for input(s): AMMONIA in the last 168 hours. Coagulation Profile: No results for input(s): INR, PROTIME in the last 168 hours. Cardiac Enzymes: No results for input(s): CKTOTAL, CKMB, CKMBINDEX, TROPONINI, TROPONINIHS in the last 168 hours. BNP (last 3 results) No results for input(s): BNP in the last 8760 hours. HbA1C: No results for input(s): HGBA1C in the last 72 hours. CBG: No results for input(s): GLUCAP in the last 168 hours. Lipid Profile: No results for input(s): CHOL, HDL, LDLCALC, TRIG, CHOLHDL, LDLDIRECT in the last 72 hours. Thyroid  Function Tests: No results for input(s): TSH, T4TOTAL, FREET4, T3FREE, THYROIDAB in the last 72 hours. Anemia Panel: No results for input(s): VITAMINB12, FOLATE, FERRITIN, TIBC, IRON , RETICCTPCT in the last 72 hours. Urine analysis:    Component Value Date/Time   COLORURINE YELLOW (A) 10/20/2023 0924   APPEARANCEUR CLEAR (A)  10/20/2023 0924   APPEARANCEUR Clear 07/20/2023 1107   LABSPEC 1.016 10/20/2023 0924   PHURINE 7.0 10/20/2023 0924   GLUCOSEU NEGATIVE 10/20/2023 0924   GLUCOSEU NEGATIVE 02/13/2022 1009   HGBUR NEGATIVE 10/20/2023 0924   BILIRUBINUR NEGATIVE 10/20/2023 0924   BILIRUBINUR Negative 07/20/2023 1107   KETONESUR NEGATIVE 10/20/2023 0924   PROTEINUR 30 (A) 10/20/2023 0924   UROBILINOGEN 1.0 03/09/2023 0937   UROBILINOGEN 0.2 02/13/2022 1009   NITRITE NEGATIVE 10/20/2023 0924   LEUKOCYTESUR SMALL (A) 10/20/2023 0924    Radiological Exams on Admission: I have personally reviewed images DG Chest 2 View Result Date: 05/26/2024 CLINICAL DATA:  Shortness of breath and cough EXAM: CHEST - 2 VIEW COMPARISON:  05/12/2024, CT 05/22/2024 FINDINGS: Cardiomegaly. Small pleural effusions. No focal consolidation or pneumothorax IMPRESSION: Cardiomegaly with small pleural effusions. Electronically Signed   By: Luke Bun M.D.   On: 05/26/2024 22:11    EKG: My personal interpretation of EKG shows: A-fib with RVR    Assessment/Plan Principal Problem:   Atrial fibrillation (HCC) Active Problems:   Essential hypertension, benign   GERD (gastroesophageal reflux disease)   Hyperlipidemia    Assessment and Plan: 79 year old female double/PMH of HTN, HLD, GERD who was evaluated for chronic cough with PCP with CT scan of the chest, showed possible large pericardial effusion and came to ED for evaluation.  1.  Pericardial effusion - Per cardiology this is not large does not have tamponade - Advised to manage at [facility]. - Follow cardiology recommendation.  2.  New onset of A-fib with RVR - She will be placed in observation - She has been started on heparin  drip and Cardizem  has been switched to oral metoprolol  by cardiology. - She will be monitored in telemetry, echocardiogram has been done - Further recommendation per cardiology  3.  GERD - She will be placed on Protonix  while she is in the  hospital. - She takes Nexium  at home.  4.  Asthma without any exacerbation - Patient has a chronic cough may be related to GERD - Discussed with the patient. - Does not appear to be on exacerbation - She will be given DuoNebs and resume her home medications  5.  HTN/HLD - Resume her home medications metoprolol , hydrochlorothiazide  and rosuvastatin  - Due to chronic cough I will stop losartan , this may have caused chronic cough. - Continue to monitor blood pressure      DVT prophylaxis: Lovenox  Code Status: Full Code Family Communication: None  Disposition Plan: Home Consults called: Cardiology Admission status: Observation, Telemetry bed   Nena Rebel, MD [facility] 05/27/2024, 10:06 AM        [1]  Allergies Allergen Reactions   Flexeril  [Cyclobenzaprine ] Other (See Comments)    Sedation and visual disturbances   Codeine Hives   Iodinated Contrast Media Swelling   "

## 2024-05-27 NOTE — ED Notes (Signed)
 Pt up to the bathroom, independently

## 2024-05-27 NOTE — ED Provider Notes (Signed)
----------------------------------------- °  8:24 AM on 05/27/2024 ----------------------------------------- Patient has been seen and evaluated by Dr. Lonni End of cardiology.  Patient is in atrial fibrillation with rapid ventricular response, we will begin treatment.  He does not believe the patient needs to be transferred to [facility] and states we are capable of caring for the patient locally.  He has reviewed the echocardiogram and the pericardial effusion appears to be small.  Will admit locally with cardiology consultation.   Dorothyann Drivers, MD 05/27/24 [phone]

## 2024-05-27 NOTE — Progress Notes (Signed)
 PHARMACY - ANTICOAGULATION CONSULT NOTE  Pharmacy Consult for Heparin  Indication: atrial fibrillation  Allergies[1]  Patient Measurements: Height: 5' 7 (170.2 cm) Weight: 97.1 kg (214 lb) IBW/kg (Calculated) : 61.6 HEPARIN  DW (KG): 83  Vital Signs: Temp: 98.3 F (36.8 C) (01/20 1944) Temp Source: Oral (01/20 1944) BP: 126/78 (01/20 1944) Pulse Rate: 92 (01/20 1944)  Labs: Recent Labs    05/26/24 1912 05/27/24 1116 05/27/24 1309  HGB 10.8*  --   --   HCT 32.7*  --   --   PLT 423*  --   --   APTT  --  33  --   LABPROT  --  15.2  --   INR  --  1.1  --   HEPARINUNFRC  --   --  0.15*  CREATININE 1.37*  --   --     Estimated Creatinine Clearance: 40.5 mL/min (A) (by C-G formula based on SCr of 1.37 mg/dL (H)).   Medical History: Past Medical History:  Diagnosis Date   Allergy    Anxiety 1970   i think   Asthma    Cancer (HCC) 2025   Cataract 2009   i think   Depression    GERD (gastroesophageal reflux disease) 2015   i think   History of chicken pox    HLD (hyperlipidemia)    Hx: UTI (urinary tract infection)    Hypertension    Neuromuscular disorder (HCC)    Sleep apnea     Medications:  Medications Prior to Admission  Medication Sig Dispense Refill Last Dose/Taking   acetaminophen  (TYLENOL ) 500 MG tablet Take 500 mg by mouth every 6 (six) hours as needed for mild pain (pain score 1-3).   Unknown   albuterol  (PROVENTIL ) (2.5 MG/3ML) 0.083% nebulizer solution Take 3 mLs (2.5 mg total) by nebulization every 4 (four) hours as needed for wheezing or shortness of breath. 75 mL 2 Past Week   albuterol  (VENTOLIN  HFA) 108 (90 Base) MCG/ACT inhaler Inhale 2 puffs into the lungs every 6 (six) hours as needed for wheezing or shortness of breath. 18 g 2 Past Week   esomeprazole  (NEXIUM ) 40 MG capsule Take 1 capsule (40 mg total) by mouth daily. 90 capsule 1 Past Week   fluticasone -salmeterol (ADVAIR) 250-50 MCG/ACT AEPB INHALE 1 PUFF TWICE A DAY AS DIRECTED **RINSE  MOUTH AFTER USE** (MORNING AND AT BEDTIME) 60 each 12 Past Week   hydrochlorothiazide  (HYDRODIURIL ) 25 MG tablet Take 0.5 tablets (12.5 mg total) by mouth daily. 45 tablet 1 Past Week   losartan  (COZAAR ) 25 MG tablet TAKE 1 TABLET(25 MG) BY MOUTH DAILY 90 tablet 1 Past Week   rosuvastatin  (CRESTOR ) 10 MG tablet Take 1 tablet (10 mg total) by mouth daily. 90 tablet 2 Past Week   sertraline  (ZOLOFT ) 25 MG tablet Take 1 1/2 tablet per day. (Patient taking differently: Take 25 mg by mouth daily. Take 1 1/2 tablet per day.) 135 tablet 1 Past Week   methylPREDNISolone  (MEDROL  DOSEPAK) 4 MG TBPK tablet Medrol  dose pak - 6 day taper (Patient not taking: Reported on 05/27/2024) 18 tablet 0 Not Taking   Scheduled:   hydrochlorothiazide   12.5 mg Oral Daily   metoprolol  tartrate  25 mg Oral BID   pantoprazole   40 mg Oral BID AC   rosuvastatin   10 mg Oral Daily   Infusions:   heparin  1,150 Units/hr (05/27/24 1104)   PRN:  Anti-infectives (From admission, onward)    None       Assessment:  Patient is a 79 yo F who presented to the ED after being sent in by her PCP due to concern for a large pericardial effusion seen on outpatient CT. Patient also reports a chronic nonproductive cough and SOB over the past few days. PMH significant for HTN, HLD, GERD, and asthma, with no prior cardiac hx. Upon presentation, an ECHO was obtained and was only notable for a small pericardial effusion. However, during her ED course, patient went into Afib. She is not on any anticoagulation at home. Pharmacy is consulted for heparin  dosing and monitoring.  CHADSVASc score of 4  Baseline CBC w/ Hgb 10.8 and PLTs 423 Baseline aPTT and INR in process    Goal of Therapy:  Heparin  level 0.3-0.7 units/ml Monitor platelets by anticoagulation protocol: Yes  1/20@1901 : HL<0.10, subtherapeutic@1150  units/hr   Plan:  - Will order Heparin  2500 units IV bolus x 1 - Will increase Heparin  gtt rate at 1400 units/hr - Will check  HL in 8 hrs after rate change - Will monitor CBC daily     Emma Birchler A Zubin Pontillo, PharmD Clinical Pharmacist 05/27/2024 8:40 PM        [1]  Allergies Allergen Reactions   Flexeril  [Cyclobenzaprine ] Other (See Comments)    Sedation and visual disturbances   Codeine Hives   Iodinated Contrast Media Swelling

## 2024-05-27 NOTE — Consult Note (Signed)
 "  Cardiology Consultation   [name] MRN: [id number]; DOB: [birth year]  Admit date: 05/26/2024 Date of Consult: 05/27/2024  PCP:  Glendia Shad, MD   [facility] Providers Cardiologist:  Redell Cave, MD        Patient Profile: Susan Miller is a 79 y.o. female with a hx of hypertension, hyperlipidemia, GERD, and asthma who is being seen 05/27/2024 for the evaluation of pericardial effusion at the request of Dr. Dorothyann.  History of Present Illness: Susan Miller has a history of hypertension, hyperlipidemia, GERD, and asthma followed by pulmonology.  She has no prior cardiac history.  She was previously evaluated by Dr. Cave in 2020 for evaluation of shortness of breath.  Echo 11/2019 revealed EF 60 to 65% with no RWMA, mild LVH, G1 DD, and no significant valvular abnormalities.  Patient reports chronic nonproductive cough but recent worsening of dyspnea on exertion over the past couple of weeks.  She was evaluated in the outpatient setting by her PCP who ordered a CTA of the chest which revealed a large pericardial effusion and small bilateral pleural effusions.  She was referred to the ED for further evaluation.  In the ED, BP was mildly elevated at 140/79 and she was tachycardic with heart rate 115 bpm.  Other vital signs were normal.  Pertinent labs include BUN 27, creatinine 1.37, Hgb 10.8, HCT 32.7, platelets 423.  proBNP 723.  Troponin 20 > 26.  Initial EKG revealed narrow complex tachycardia, sinus with PACs vs atrial fibrillation/flutter, rate 116 bpm.  Patient was given IV fluids in the ED.  Cardiology was asked to consult for further evaluation of pericardial effusion at time of cardiology consult, patient reports symptoms are overall unchanged.  She denies chest pain, lightheadedness, dizziness, palpitations, and lower extremity swelling.  Past Medical History:  Diagnosis Date   Allergy    Anxiety 1970   i think   Asthma    Cancer (HCC)  2025   Cataract 2009   i think   Depression    GERD (gastroesophageal reflux disease) 2015   i think   History of chicken pox    HLD (hyperlipidemia)    Hx: UTI (urinary tract infection)    Hypertension    Neuromuscular disorder (HCC)    Sleep apnea     Past Surgical History:  Procedure Laterality Date   COLONOSCOPY WITH PROPOFOL  N/A 06/20/2016   Procedure: COLONOSCOPY WITH PROPOFOL ;  Surgeon: Rogelia Copping, MD;  Location: [facility] ENDOSCOPY;  Service: Endoscopy;  Laterality: N/A;   COLONOSCOPY WITH PROPOFOL  N/A 02/14/2022   Procedure: COLONOSCOPY WITH PROPOFOL ;  Surgeon: Copping Rogelia, MD;  Location: [facility] ENDOSCOPY;  Service: Endoscopy;  Laterality: N/A;   ESOPHAGOGASTRODUODENOSCOPY N/A 02/14/2022   Procedure: ESOPHAGOGASTRODUODENOSCOPY (EGD);  Surgeon: Copping Rogelia, MD;  Location: [facility] ENDOSCOPY;  Service: Endoscopy;  Laterality: N/A;   ESOPHAGOGASTRODUODENOSCOPY (EGD) WITH PROPOFOL  N/A 06/20/2016   Procedure: ESOPHAGOGASTRODUODENOSCOPY (EGD) WITH PROPOFOL ;  Surgeon: Rogelia Copping, MD;  Location: [facility] ENDOSCOPY;  Service: Endoscopy;  Laterality: N/A;   TONSILLECTOMY     as a child   TUMOR REMOVAL     behind ear       Scheduled Meds:  heparin   4,000 Units Intravenous Once   hydrochlorothiazide   12.5 mg Oral Daily   metoprolol  tartrate  25 mg Oral BID   pantoprazole   40 mg Oral BID AC   rosuvastatin   10 mg Oral Daily   Continuous Infusions:  heparin      PRN Meds: acetaminophen  **  OR** acetaminophen , albuterol , fentaNYL  (SUBLIMAZE ) injection, ondansetron  **OR** ondansetron  (ZOFRAN ) IV, oxyCODONE , polyethylene glycol  Allergies:   Allergies[1]  Social History:   Social History   Socioeconomic History   Marital status: Divorced    Spouse name: Not on file   Number of children: Not on file   Years of education: Not on file   Highest education level: Associate degree: occupational, scientist, product/process development, or vocational program  Occupational History   Not on file  Tobacco Use   Smoking  status: Never   Smokeless tobacco: Never  Vaping Use   Vaping status: Never Used  Substance and Sexual Activity   Alcohol use: No    Alcohol/week: 0.0 standard drinks of alcohol   Drug use: No   Sexual activity: Not Currently  Other Topics Concern   Not on file  Social History Narrative   Not on file   Social Drivers of Health   Tobacco Use: Low Risk (05/26/2024)   Patient History    Smoking Tobacco Use: Never    Smokeless Tobacco Use: Never    Passive Exposure: Not on file  Financial Resource Strain: Low Risk (03/22/2024)   Overall Financial Resource Strain (CARDIA)    Difficulty of Paying Living Expenses: Not very hard  Recent Concern: Financial Resource Strain - Medium Risk (01/24/2024)   Overall Financial Resource Strain (CARDIA)    Difficulty of Paying Living Expenses: Somewhat hard  Food Insecurity: No Food Insecurity (03/22/2024)   Epic    Worried About Radiation Protection Practitioner of Food in the Last Year: Never true    Ran Out of Food in the Last Year: Never true  Transportation Needs: No Transportation Needs (03/22/2024)   Epic    Lack of Transportation (Medical): No    Lack of Transportation (Non-Medical): No  Physical Activity: Inactive (03/22/2024)   Exercise Vital Sign    Days of Exercise per Week: 0 days    Minutes of Exercise per Session: Not on file  Stress: Stress Concern Present (03/22/2024)   [employer] of Occupational Health - Occupational Stress Questionnaire    Feeling of Stress: To some extent  Social Connections: Socially Isolated (03/22/2024)   Social Connection and Isolation Panel    Frequency of Communication with Friends and Family: Twice a week    Frequency of Social Gatherings with Friends and Family: Once a week    Attends Religious Services: Never    Database Administrator or Organizations: No    Attends Engineer, Structural: Not on file    Marital Status: Divorced  Intimate Partner Violence: Not At Risk (01/16/2024)   Epic    Fear of  Current or Ex-Partner: No    Emotionally Abused: No    Physically Abused: No    Sexually Abused: No  Depression (PHQ2-9): Medium Risk (05/12/2024)   Depression (PHQ2-9)    PHQ-2 Score: 5  Alcohol Screen: Low Risk (01/16/2024)   Alcohol Screen    Last Alcohol Screening Score (AUDIT): 0  Housing: Unknown (04/01/2024)   Received from [facility]   Epic    Unable to Pay for Housing in the Last Year: Not on file    Number of Times Moved in the Last Year: Not on file    At any time in the past 12 months, were you homeless or living in a shelter (including now)?: No  Utilities: Not At Risk (01/16/2024)   Epic    Threatened with loss of utilities: No  Health Literacy: Adequate Health Literacy (  01/16/2024)   B1300 Health Literacy    Frequency of need for help with medical instructions: Never    Family History:    Family History  Problem Relation Age of Onset   Hypertension Mother    Cancer Sister        colon cancer   Hypertension Sister    Diabetes Sister    Hypertension Brother    Hypertension Maternal Aunt    Breast cancer Maternal Aunt 60   Hypertension Maternal Uncle    Stroke Maternal Grandmother    Hypertension Maternal Grandmother    Arthritis Paternal Grandmother    COPD Brother    Hypertension Brother    ADD / ADHD Son    Alcohol abuse Son    Birth defects Son    Intellectual disability Son    Learning disabilities Son      ROS:  Please see the history of present illness.   Physical Exam/Data: Vitals:   05/27/24 0830 05/27/24 0900 05/27/24 0930 05/27/24 1000  BP: 123/76 113/75 122/62 134/86  Pulse: 94 100 (!) 107 (!) 113  Resp: 19 (!) 21 (!) 23 17  Temp:      SpO2: 98%     Weight:      Height:        Intake/Output Summary (Last 24 hours) at 05/27/2024 1033 Last data filed at 05/27/2024 0035 Gross per 24 hour  Intake 1000 ml  Output --  Net 1000 ml      05/26/2024    7:09 PM 05/12/2024    3:05 PM 04/18/2024    1:06 PM  Last 3 Weights   Weight (lbs) 214 lb 214 lb 223 lb  Weight (kg) 97.07 kg 97.07 kg 101.152 kg     Body mass index is 33.52 kg/m.  General:  Well nourished, well developed, in no acute distress HEENT: normal Neck: no JVD Vascular: No carotid bruits; Distal pulses 2+ bilaterally Cardiac:  normal S1, S2; IRIR; no murmur  Lungs:  clear to auscultation bilaterally, no wheezing, rhonchi or rales  Abd: soft, nontender, no hepatomegaly  Ext: trace bilateral LE edema Skin: warm and dry  Psych:  Normal affect   EKG:  The EKG obtained 1/20 at 0913 was personally reviewed and demonstrates:  sinus tachycardia with nonspecific T wave abnormalities, rate 102 bpm Telemetry:  Telemetry was personally reviewed and demonstrates: Sinus rhythm/sinus tachycardia with paroxysms of atrial fibrillation/flutter, rate up to 130 bpm  Relevant CV Studies:  05/27/2024 2D echo Pending read  Laboratory Data: High Sensitivity Troponin:  No results for input(s): TROPONINIHS in the last 720 hours.  Recent Labs  Lab 05/26/24 1912 05/26/24 2055  TRNPT 20* 26*      Chemistry Recent Labs  Lab 05/26/24 1912  NA 135  K 4.3  CL 99  CO2 23  GLUCOSE 154*  BUN 27*  CREATININE 1.37*  CALCIUM  9.4  GFRNONAA 39*  ANIONGAP 13    No results for input(s): PROT, ALBUMIN , AST, ALT, ALKPHOS, BILITOT in the last 168 hours. Lipids No results for input(s): CHOL, TRIG, HDL, LABVLDL, LDLCALC, CHOLHDL in the last 168 hours.  Hematology Recent Labs  Lab 05/26/24 1912  WBC 14.8*  RBC 3.75*  HGB 10.8*  HCT 32.7*  MCV 87.2  MCH 28.8  MCHC 33.0  RDW 13.3  PLT 423*   Thyroid  No results for input(s): TSH, FREET4 in the last 168 hours.  BNP Recent Labs  Lab 05/26/24 1912  PROBNP 723.0*    DDimer No  results for input(s): DDIMER in the last 168 hours.  Radiology/Studies:  DG Chest 2 View Result Date: 05/26/2024 IMPRESSION: Cardiomegaly with small pleural effusions. Electronically Signed   By: Luke Bun M.D.   On: 05/26/2024 22:11   Assessment and Plan:  Pericardial effusion - Outpatient chest CT obtained in the setting of dyspnea on exertion revealed large pericardial effusion - Preliminary read by MD on echo this morning revealed small pericardial effusion without evidence of tamponade - Patient is hemodynamically stable with no indication for pericardiocentesis at this time - Repeat limited echo as outpatient  Paroxysmal atrial fibrillation with RVR - No documented history of A-fib on chart review - Patient noted to have paroxysmal atrial fibrillation/flutter with RVR on telemetry, rate up to 130 bpm - Patient seems to be overall asymptomatic to A-fib, without palpitations.  However, query if this could be contributing to dyspnea on exertion. - K normal. Check mag and TSH.  - Start IV heparin  for CHA2DS2-VASc 5 with plan to transition to DOAC prior to discharge - Start metoprolol  tartrate 25 mg twice daily for rate control - Echo read pending  Hypertension - BP well-controlled on current regimen  Hyperlipidemia - Continue statin therapy   Risk Assessment/Risk Scores:  CHA2DS2-VASc Score =     This indicates a  % annual risk of stroke. The patient's score is based upon:        For questions or updates, please contact [facility] Please consult [email or web address] for contact info under      Signed, Lesley LITTIE Maffucci, PA-C  05/27/2024 10:33 AM     [1]  Allergies Allergen Reactions   Flexeril  [Cyclobenzaprine ] Other (See Comments)    Sedation and visual disturbances   Codeine Hives   Iodinated Contrast Media Swelling   "

## 2024-05-27 NOTE — ED Notes (Signed)
 Pt ambulatory to restroom

## 2024-05-28 ENCOUNTER — Encounter: Payer: Self-pay | Admitting: Oncology

## 2024-05-28 ENCOUNTER — Other Ambulatory Visit: Payer: Self-pay

## 2024-05-28 ENCOUNTER — Other Ambulatory Visit (HOSPITAL_COMMUNITY): Payer: Self-pay

## 2024-05-28 DIAGNOSIS — I10 Essential (primary) hypertension: Secondary | ICD-10-CM

## 2024-05-28 DIAGNOSIS — R0609 Other forms of dyspnea: Secondary | ICD-10-CM | POA: Diagnosis not present

## 2024-05-28 DIAGNOSIS — I480 Paroxysmal atrial fibrillation: Secondary | ICD-10-CM

## 2024-05-28 DIAGNOSIS — I3139 Other pericardial effusion (noninflammatory): Secondary | ICD-10-CM | POA: Diagnosis not present

## 2024-05-28 LAB — CBC
HCT: 29.8 % — ABNORMAL LOW (ref 36.0–46.0)
Hemoglobin: 9.6 g/dL — ABNORMAL LOW (ref 12.0–15.0)
MCH: 28.7 pg (ref 26.0–34.0)
MCHC: 32.2 g/dL (ref 30.0–36.0)
MCV: 89 fL (ref 80.0–100.0)
Platelets: 368 K/uL (ref 150–400)
RBC: 3.35 MIL/uL — ABNORMAL LOW (ref 3.87–5.11)
RDW: 13.3 % (ref 11.5–15.5)
WBC: 8.9 K/uL (ref 4.0–10.5)
nRBC: 0 % (ref 0.0–0.2)

## 2024-05-28 LAB — BASIC METABOLIC PANEL WITH GFR
Anion gap: 9 (ref 5–15)
BUN: 16 mg/dL (ref 8–23)
CO2: 26 mmol/L (ref 22–32)
Calcium: 9.5 mg/dL (ref 8.9–10.3)
Chloride: 104 mmol/L (ref 98–111)
Creatinine, Ser: 1 mg/dL (ref 0.44–1.00)
GFR, Estimated: 57 mL/min — ABNORMAL LOW
Glucose, Bld: 106 mg/dL — ABNORMAL HIGH (ref 70–99)
Potassium: 4.2 mmol/L (ref 3.5–5.1)
Sodium: 139 mmol/L (ref 135–145)

## 2024-05-28 LAB — HEPARIN LEVEL (UNFRACTIONATED): Heparin Unfractionated: 0.33 [IU]/mL (ref 0.30–0.70)

## 2024-05-28 LAB — PROTIME-INR
INR: 1.2 (ref 0.8–1.2)
Prothrombin Time: 15.6 s — ABNORMAL HIGH (ref 11.4–15.2)

## 2024-05-28 MED ORDER — APIXABAN 5 MG PO TABS
5.0000 mg | ORAL_TABLET | Freq: Two times a day (BID) | ORAL | Status: DC
  Administered 2024-05-28: 5 mg via ORAL
  Filled 2024-05-28: qty 1

## 2024-05-28 MED ORDER — HYDROCHLOROTHIAZIDE 12.5 MG PO TABS
12.5000 mg | ORAL_TABLET | Freq: Every day | ORAL | Status: DC
  Administered 2024-05-28: 12.5 mg via ORAL
  Filled 2024-05-28: qty 1

## 2024-05-28 MED ORDER — METOPROLOL TARTRATE 50 MG PO TABS: 50.0000 mg | ORAL_TABLET | Freq: Two times a day (BID) | ORAL | Status: DC

## 2024-05-28 MED ORDER — APIXABAN 5 MG PO TABS
5.0000 mg | ORAL_TABLET | Freq: Two times a day (BID) | ORAL | 2 refills | Status: AC
  Filled 2024-05-28: qty 60, 30d supply, fill #0

## 2024-05-28 MED ORDER — METOPROLOL TARTRATE 25 MG PO TABS
25.0000 mg | ORAL_TABLET | Freq: Once | ORAL | Status: AC
  Administered 2024-05-28: 25 mg via ORAL
  Filled 2024-05-28: qty 1

## 2024-05-28 MED ORDER — METOPROLOL TARTRATE 50 MG PO TABS
50.0000 mg | ORAL_TABLET | Freq: Two times a day (BID) | ORAL | 1 refills | Status: AC
  Filled 2024-05-28: qty 60, 30d supply, fill #0

## 2024-05-28 MED ORDER — COLCHICINE 0.6 MG PO TABS
0.6000 mg | ORAL_TABLET | Freq: Every day | ORAL | 1 refills | Status: AC
  Filled 2024-05-28: qty 30, 30d supply, fill #0

## 2024-05-28 NOTE — Progress Notes (Signed)
 "  Rounding Note   Patient Name: Susan Miller Date of Encounter: 05/28/2024  [facility] Cardiologist: Redell Cave, MD   Subjective Sitting up in recliner, reports feeling well Long discussion concerning recent events Reports stuttering left shoulder pain over the past month, has done several rounds of prednisone  with muscle relaxer -Recent episodes of double vision, stopped the muscle relaxer medication with improvement of symptoms - Given continued pain CT scan chest was ordered with report detailing large pericardial effusion, she was sent to the emergency room - Echocardiogram performed with only small pericardial effusion documented, images confirmed by myself on rounds today - Episode of paroxysmal atrial fibrillation noted on telemetry yesterday, started on heparin  and metoprolol  25 twice daily - No further arrhythmia in the last 24 hours   Scheduled Meds:  apixaban   5 mg Oral BID   colchicine   0.6 mg Oral Daily   hydrochlorothiazide   12.5 mg Oral Daily   metoprolol  tartrate  50 mg Oral BID   pantoprazole   40 mg Oral BID AC   rosuvastatin   10 mg Oral Daily   Continuous Infusions:  PRN Meds: acetaminophen  **OR** acetaminophen , albuterol , fentaNYL  (SUBLIMAZE ) injection, ondansetron  **OR** ondansetron  (ZOFRAN ) IV, oxyCODONE , polyethylene glycol   Vital Signs  Vitals:   05/27/24 1944 05/27/24 2309 05/28/24 0344 05/28/24 0746  BP: 126/78 (!) 114/57 135/89 116/69  Pulse: 92 88 82 77  Resp:      Temp: 98.3 F (36.8 C) 98.4 F (36.9 C) 98.3 F (36.8 C) 97.6 F (36.4 C)  TempSrc: Oral Oral Oral Oral  SpO2: 99% 98% 99% 97%  Weight:      Height:        Intake/Output Summary (Last 24 hours) at 05/28/2024 1401 Last data filed at 05/28/2024 0700 Gross per 24 hour  Intake 464.71 ml  Output --  Net 464.71 ml      05/26/2024    7:09 PM 05/12/2024    3:05 PM 04/18/2024    1:06 PM  Last 3 Weights  Weight (lbs) 214 lb 214 lb 223 lb  Weight (kg) 97.07 kg  97.07 kg 101.152 kg      Telemetry Normal sinus rhythm- Personally Reviewed  ECG   - Personally Reviewed  Physical Exam  GEN: No acute distress.   Neck: No JVD Cardiac: RRR, no murmurs, rubs, or gallops.  Respiratory: Clear to auscultation bilaterally. GI: Soft, nontender, non-distended  MS: No edema; No deformity. Neuro:  Nonfocal  Psych: Normal affect   Labs High Sensitivity Troponin:  No results for input(s): TROPONINIHS in the last 720 hours.  Recent Labs  Lab 05/26/24 1912 05/26/24 2055  TRNPT 20* 26*       Chemistry Recent Labs  Lab 05/26/24 1912 05/26/24 2055 05/28/24 0740  NA 135  --  139  K 4.3  --  4.2  CL 99  --  104  CO2 23  --  26  GLUCOSE 154*  --  106*  BUN 27*  --  16  CREATININE 1.37*  --  1.00  CALCIUM  9.4  --  9.5  MG  --  2.0  --   GFRNONAA 39*  --  57*  ANIONGAP 13  --  9    Lipids No results for input(s): CHOL, TRIG, HDL, LABVLDL, LDLCALC, CHOLHDL in the last 168 hours.  Hematology Recent Labs  Lab 05/26/24 1912 05/28/24 0740  WBC 14.8* 8.9  RBC 3.75* 3.35*  HGB 10.8* 9.6*  HCT 32.7* 29.8*  MCV 87.2  89.0  MCH 28.8 28.7  MCHC 33.0 32.2  RDW 13.3 13.3  PLT 423* 368   Thyroid   Recent Labs  Lab 05/26/24 2055  TSH 2.460    BNP Recent Labs  Lab 05/26/24 1912  PROBNP 723.0*    DDimer No results for input(s): DDIMER in the last 168 hours.   Radiology  ECHOCARDIOGRAM COMPLETE Result Date: 05/27/2024    ECHOCARDIOGRAM REPORT   Patient Name:   Susan Miller [facility] Date of Exam: 05/27/2024 Medical Rec #:  [id number]      Height:       67.0 in Accession #:    [id number]     Weight:       214.0 lb Date of Birth:  [birth year]     BSA:          2.081 m Patient Age:    79 years       BP:           124/64 mmHg Patient Gender: F              HR:           88 bpm. Exam Location:  [facility] Procedure: 2D Echo, Cardiac Doppler and Color Doppler (Both Spectral and Color            Flow Doppler were utilized during procedure). STAT ECHO  Indications:     Pericardial Effusion I31.3  History:         Patient has prior history of Echocardiogram examinations, most                  recent 11/21/2019. Risk Factors:Hypertension and Dyslipidemia.                  Anxiety.  Sonographer:     Christopher Furnace Referring Phys:  [id number] ROLLO JONELLE LOUDER Diagnosing Phys: Lonni Hanson MD IMPRESSIONS  1. Left ventricular ejection fraction, by estimation, is 60 to 65%. The left ventricle has normal function. Left ventricular endocardial border not optimally defined to evaluate regional wall motion. There is mild left ventricular hypertrophy. Left ventricular diastolic function could not be evaluated.  2. Right ventricular systolic function is normal. The right ventricular size is normal. There is normal pulmonary artery systolic pressure.  3. A small pericardial effusion is present. The pericardial effusion is circumferential. There is no evidence of cardiac tamponade.  4. The mitral valve is degenerative. Trivial mitral valve regurgitation. No evidence of mitral stenosis.  5. The aortic valve was not well visualized. Aortic valve regurgitation is not visualized. No aortic stenosis is present.  6. The inferior vena cava is normal in size with greater than 50% respiratory variability, suggesting right atrial pressure of 3 mmHg. FINDINGS  Left Ventricle: Left ventricular ejection fraction, by estimation, is 60 to 65%. The left ventricle has normal function. Left ventricular endocardial border not optimally defined to evaluate regional wall motion. The left ventricular internal cavity size was normal in size. There is mild left ventricular hypertrophy. Left ventricular diastolic function could not be evaluated due to atrial fibrillation. Left ventricular diastolic function could not be evaluated. Right Ventricle: The right ventricular size is normal. No increase in right ventricular wall thickness. Right ventricular systolic function is normal. There is normal pulmonary  artery systolic pressure. The tricuspid regurgitant velocity is 2.45 m/s, and  with an assumed right atrial pressure of 3 mmHg, the estimated right ventricular systolic pressure is 27.1 mmHg. Left Atrium: Left atrial size was normal in size. Right  Atrium: Right atrial size was normal in size. Pericardium: A small pericardial effusion is present. The pericardial effusion is circumferential. There is no evidence of cardiac tamponade. Presence of epicardial fat layer. Mitral Valve: The mitral valve is degenerative in appearance. There is mild thickening of the mitral valve leaflet(s). There is mild calcification of the mitral valve leaflet(s). Trivial mitral valve regurgitation. No evidence of mitral valve stenosis. Tricuspid Valve: The tricuspid valve is not well visualized. Tricuspid valve regurgitation is mild. Aortic Valve: The aortic valve was not well visualized. Aortic valve regurgitation is not visualized. No aortic stenosis is present. Aortic valve mean gradient measures 2.0 mmHg. Aortic valve peak gradient measures 4.2 mmHg. Aortic valve area, by VTI measures 4.98 cm. Pulmonic Valve: The pulmonic valve was not well visualized. Pulmonic valve regurgitation is not visualized. No evidence of pulmonic stenosis. Aorta: The aortic root is normal in size and structure. Pulmonary Artery: The pulmonary artery is not well seen. Venous: The inferior vena cava is normal in size with greater than 50% respiratory variability, suggesting right atrial pressure of 3 mmHg. IAS/Shunts: No atrial level shunt detected by color flow Doppler.  LEFT VENTRICLE PLAX 2D LVIDd:         4.60 cm LVIDs:         2.80 cm LV PW:         1.20 cm LV IVS:        1.10 cm LVOT diam:     2.20 cm LV SV:         68 LV SV Index:   33 LVOT Area:     3.80 cm LV IVRT:       90 msec  RIGHT VENTRICLE RV Basal diam:  3.10 cm RV Mid diam:    3.00 cm RV S prime:     15.60 cm/s TAPSE (M-mode): 1.4 cm LEFT ATRIUM             Index        RIGHT ATRIUM            Index LA diam:        3.00 cm 1.44 cm/m   RA Area:     17.70 cm LA Vol (A2C):   25.1 ml 12.06 ml/m  RA Volume:   45.50 ml  21.86 ml/m LA Vol (A4C):   30.3 ml 14.56 ml/m LA Biplane Vol: 27.4 ml 13.17 ml/m  AORTIC VALVE AV Area (Vmax):    3.88 cm AV Area (Vmean):   3.77 cm AV Area (VTI):     4.98 cm AV Vmax:           103.00 cm/s AV Vmean:          67.900 cm/s AV VTI:            0.136 m AV Peak Grad:      4.2 mmHg AV Mean Grad:      2.0 mmHg LVOT Vmax:         105.00 cm/s LVOT Vmean:        67.300 cm/s LVOT VTI:          0.178 m LVOT/AV VTI ratio: 1.31  AORTA Ao Root diam: 3.00 cm MITRAL VALVE                TRICUSPID VALVE MV Area (PHT): 4.33 cm     TR Peak grad:   24.1 mmHg MV Decel Time: 175 msec     TR Vmax:  245.44 cm/s MV E velocity: 112.00 cm/s                             SHUNTS                             Systemic VTI:  0.18 m                             Systemic Diam: 2.20 cm Lonni Hanson MD Electronically signed by Lonni Hanson MD Signature Date/Time: 05/27/2024/11:49:43 AM    Final    DG Chest 2 View Result Date: 05/26/2024 CLINICAL DATA:  Shortness of breath and cough EXAM: CHEST - 2 VIEW COMPARISON:  05/12/2024, CT 05/22/2024 FINDINGS: Cardiomegaly. Small pleural effusions. No focal consolidation or pneumothorax IMPRESSION: Cardiomegaly with small pleural effusions. Electronically Signed   By: Luke Bun M.D.   On: 05/26/2024 22:11    Cardiac Studies   Patient Profile   Susan Miller is a 79 y.o. female with a hx of hypertension, hyperlipidemia, GERD, and asthma who is being seen 05/27/2024 for the evaluation of pericardial effusion and atrial fibrillation  Assessment & Plan  Pericardial effusion Outpatient chest CT detailing large pericardial effusion, she was started on colchicine  yesterday -Elevated CRP of 20 sed rate 79, though mixed picture in the setting of severe left shoulder pain - this could not be confirmed by echocardiogram which shows very small  pericardial effusion of little clinical significance - No further intervention needed, will continue colchicine  twice daily for now as it seems to be helping her shoulder pain - Troponin essentially negative  Paroxysmal atrial fibrillation Resolved on metoprolol  tartrate 25 twice daily, -Would recommend we increase dose up to 50 twice daily - Transition heparin  infusion to Eliquis  5 twice daily - Zio monitor at discharge to review A-fib burden  Hyperlipidemia Continue statin  Long discussion with patient and family at the bedside concerning the above  For questions or updates, please contact [facility] Please consult [email or web address] for contact info under      Signed, Evalene Lunger, MD  05/28/2024, 2:01 PM    "

## 2024-05-28 NOTE — Progress Notes (Signed)
 PHARMACY - ANTICOAGULATION CONSULT NOTE  Pharmacy Consult for Heparin  Indication: atrial fibrillation  Allergies[1]  Patient Measurements: Height: 5' 7 (170.2 cm) Weight: 97.1 kg (214 lb) IBW/kg (Calculated) : 61.6 HEPARIN  DW (KG): 83  Vital Signs: Temp: 97.6 F (36.4 C) (01/21 0746) Temp Source: Oral (01/21 0746) BP: 116/69 (01/21 0746) Pulse Rate: 77 (01/21 0746)  Labs: Recent Labs    05/26/24 1912 05/27/24 1116 05/27/24 1309 05/27/24 1901 05/28/24 0740  HGB 10.8*  --   --   --  9.6*  HCT 32.7*  --   --   --  29.8*  PLT 423*  --   --   --  368  APTT  --  33  --   --   --   LABPROT  --  15.2  --   --  15.6*  INR  --  1.1  --   --  1.2  HEPARINUNFRC  --   --  0.15* <0.10* 0.33  CREATININE 1.37*  --   --   --  1.00    Estimated Creatinine Clearance: 55.5 mL/min (by C-G formula based on SCr of 1 mg/dL).   Medical History: Past Medical History:  Diagnosis Date   Allergy    Anxiety 1970   i think   Asthma    Cancer (HCC) 2025   Cataract 2009   i think   Depression    GERD (gastroesophageal reflux disease) 2015   i think   History of chicken pox    HLD (hyperlipidemia)    Hx: UTI (urinary tract infection)    Hypertension    Neuromuscular disorder (HCC)    Sleep apnea     Medications:  Medications Prior to Admission  Medication Sig Dispense Refill Last Dose/Taking   acetaminophen  (TYLENOL ) 500 MG tablet Take 500 mg by mouth every 6 (six) hours as needed for mild pain (pain score 1-3).   Unknown   albuterol  (PROVENTIL ) (2.5 MG/3ML) 0.083% nebulizer solution Take 3 mLs (2.5 mg total) by nebulization every 4 (four) hours as needed for wheezing or shortness of breath. 75 mL 2 Past Week   albuterol  (VENTOLIN  HFA) 108 (90 Base) MCG/ACT inhaler Inhale 2 puffs into the lungs every 6 (six) hours as needed for wheezing or shortness of breath. 18 g 2 Past Week   esomeprazole  (NEXIUM ) 40 MG capsule Take 1 capsule (40 mg total) by mouth daily. 90 capsule 1 Past Week    fluticasone -salmeterol (ADVAIR) 250-50 MCG/ACT AEPB INHALE 1 PUFF TWICE A DAY AS DIRECTED **RINSE MOUTH AFTER USE** (MORNING AND AT BEDTIME) 60 each 12 Past Week   hydrochlorothiazide  (HYDRODIURIL ) 25 MG tablet Take 0.5 tablets (12.5 mg total) by mouth daily. 45 tablet 1 Past Week   losartan  (COZAAR ) 25 MG tablet TAKE 1 TABLET(25 MG) BY MOUTH DAILY 90 tablet 1 Past Week   rosuvastatin  (CRESTOR ) 10 MG tablet Take 1 tablet (10 mg total) by mouth daily. 90 tablet 2 Past Week   sertraline  (ZOLOFT ) 25 MG tablet Take 1 1/2 tablet per day. (Patient taking differently: Take 25 mg by mouth daily. Take 1 1/2 tablet per day.) 135 tablet 1 Past Week   methylPREDNISolone  (MEDROL  DOSEPAK) 4 MG TBPK tablet Medrol  dose pak - 6 day taper (Patient not taking: Reported on 05/27/2024) 18 tablet 0 Not Taking   Scheduled:   colchicine   0.6 mg Oral Daily   metoprolol  tartrate  25 mg Oral BID   pantoprazole   40 mg Oral BID AC   rosuvastatin   10 mg Oral Daily   Infusions:   heparin  1,400 Units/hr (05/28/24 0600)   PRN:  Anti-infectives (From admission, onward)    None       Assessment: Patient is a 79 yo F who presented to the ED after being sent in by her PCP due to concern for a large pericardial effusion seen on outpatient CT. Patient also reports a chronic nonproductive cough and SOB over the past few days. PMH significant for HTN, HLD, GERD, and asthma, with no prior cardiac hx. Upon presentation, an ECHO was obtained and was only notable for a small pericardial effusion. However, during her ED course, patient went into Afib. She is not on any anticoagulation at home. Pharmacy is consulted for heparin  dosing and monitoring.  CHADSVASc score of 4-5 (age x 2, hypertension, and gender, possible CHF)  Baseline CBC w/ Hgb 10.8 and PLTs 423 Baseline aPTT and INR in process    Goal of Therapy:  Heparin  level 0.3-0.7 units/ml Monitor platelets by anticoagulation protocol: Yes  1/20@1901 : HL<0.10,  subtherapeutic@1150  units/hr 1/21 @ 0740: HL , therapeutic x 1    Plan:  - Continue current Heparin  gtt rate at 1400 units/hr - Recheck HL in 8 hrs  - If HL therapeutic x 2 after next check, will transition to daily HL monitoring  - Will continue to monitor CBC daily   Ransom Blanch PGY-1 Pharmacy Resident  [place] [facility]  05/28/2024 8:45 AM         [1]  Allergies Allergen Reactions   Flexeril  [Cyclobenzaprine ] Other (See Comments)    Sedation and visual disturbances   Codeine Hives   Iodinated Contrast Media Swelling

## 2024-05-28 NOTE — Hospital Course (Addendum)
 Partly taken from H&P.  Susan Miller is a pleasant 79 y.o. female with medical history significant for GERD, HLD, HTN, asthma with chronic cough who had outpatient CT scan by PCP 4 days ago, result was available as today which showed large pericardial effusion and bilateral pleural effusions.  Patient was called in by primary care and advised to go to the emergency room for evaluation.   On presentation patient was found to be in A-fib with RVR, labs with leukocytosis at 14.8, troponin 20/26, proBNP 723, BUN 27, creatinine 1.37, chest x-ray showed cardiomegaly, small pleural effusion no focal consolidation. Outpatient CT scan showed large pericardial effusion.   Initial plan was to transfer to [facility] for concern of developing tamponade, while patient was awaiting for transfer, echo resulted and was reviewed by cardiology and they advised that patient does not have large pericardial effusion or tamponade and does not need to be transferred.  Echocardiogram shows LVEF of 60-65% with mild LVH. Normal RV size and function noted. Small pericardial effusion was present without evidence of tamponade physiology. IVC was normal in size with respiratory variation consistent with normal CVP and arguing against tamponade.   Patient was started on Cardizem  and heparin  infusion. Patient was also started on colchicine  for concern of pericarditis.  1/21: Vital stable, AKI resolved, elevated CRP at 20.2 and ESR at 79, which is more consistent with pericarditis.  Cardiology evaluated her and started on metoprolol  50 mg twice daily with Eliquis , CHA2DS2-VASc score of at least 4 . Patient will continue with colchicine .  Patient with no active pain or shortness of breath.  Cardiology is going to mail Zio monitor and a close outpatient follow-up with cardiology and in A-fib clinic.  Her home losartan  was held as blood pressure was within goal without it.  She was started on metoprolol  and will continue home HCTZ.   Home losartan  can be restarted by cardiology when appropriate.  She will continue on current medications and need to have a close follow-up with her providers for further assistance.

## 2024-05-28 NOTE — Discharge Summary (Signed)
 " Physician Discharge Summary   Patient: Susan Miller MRN: [id number] DOB: 07/28/1945  Admit date:     05/26/2024  Discharge date: 05/28/24  Discharge Physician: Amaryllis Dare   PCP: Glendia Shad, MD   Recommendations at discharge:  Please obtain CBC and BMP on follow-up Follow-up with cardiology Follow-up with primary care provider  Discharge Diagnoses: Principal Problem:   Atrial fibrillation [facility]) Active Problems:   Essential hypertension, benign   GERD (gastroesophageal reflux disease)   Dyspnea on exertion   Hyperlipidemia   Pericardial effusion   Hospital Course: Partly taken from H&P.  Susan Miller is a pleasant 79 y.o. female with medical history significant for GERD, HLD, HTN, asthma with chronic cough who had outpatient CT scan by PCP 4 days ago, result was available as today which showed large pericardial effusion and bilateral pleural effusions.  Patient was called in by primary care and advised to go to the emergency room for evaluation.   On presentation patient was found to be in A-fib with RVR, labs with leukocytosis at 14.8, troponin 20/26, proBNP 723, BUN 27, creatinine 1.37, chest x-ray showed cardiomegaly, small pleural effusion no focal consolidation. Outpatient CT scan showed large pericardial effusion.   Initial plan was to transfer to [facility] for concern of developing tamponade, while patient was awaiting for transfer, echo resulted and was reviewed by cardiology and they advised that patient does not have large pericardial effusion or tamponade and does not need to be transferred.  Echocardiogram shows LVEF of 60-65% with mild LVH. Normal RV size and function noted. Small pericardial effusion was present without evidence of tamponade physiology. IVC was normal in size with respiratory variation consistent with normal CVP and arguing against tamponade.   Patient was started on Cardizem  and heparin  infusion. Patient was also started on colchicine  for  concern of pericarditis.  1/21: Vital stable, AKI resolved, elevated CRP at 20.2 and ESR at 79, which is more consistent with pericarditis.  Cardiology evaluated her and started on metoprolol  50 mg twice daily with Eliquis , CHA2DS2-VASc score of at least 4 . Patient will continue with colchicine .  Patient with no active pain or shortness of breath.  Cardiology is going to mail Zio monitor and a close outpatient follow-up with cardiology and in A-fib clinic.  Her home losartan  was held as blood pressure was within goal without it.  She was started on metoprolol  and will continue home HCTZ.  Home losartan  can be restarted by cardiology when appropriate.  She will continue on current medications and need to have a close follow-up with her providers for further assistance.  Consultants: Cardiology Procedures performed: None Disposition: Home Diet recommendation:  Cardiac diet DISCHARGE MEDICATION: Allergies as of 05/28/2024       Reactions   Flexeril  [cyclobenzaprine ] Other (See Comments)   Sedation and visual disturbances   Codeine Hives   Iodinated Contrast Media Swelling        Medication List     PAUSE taking these medications    losartan  25 MG tablet Wait to take this until your doctor or other care provider tells you to start again. Commonly known as: COZAAR  TAKE 1 TABLET(25 MG) BY MOUTH DAILY       STOP taking these medications    methylPREDNISolone  4 MG Tbpk tablet Commonly known as: MEDROL  DOSEPAK       TAKE these medications    acetaminophen  500 MG tablet Commonly known as: TYLENOL  Take 500 mg by mouth every 6 (six) hours  as needed for mild pain (pain score 1-3).   albuterol  (2.5 MG/3ML) 0.083% nebulizer solution Commonly known as: PROVENTIL  Take 3 mLs (2.5 mg total) by nebulization every 4 (four) hours as needed for wheezing or shortness of breath.   albuterol  108 (90 Base) MCG/ACT inhaler Commonly known as: VENTOLIN  HFA Inhale 2 puffs into the lungs  every 6 (six) hours as needed for wheezing or shortness of breath.   apixaban  5 MG Tabs tablet Commonly known as: ELIQUIS  Take 1 tablet (5 mg total) by mouth 2 (two) times daily.   colchicine  0.6 MG tablet Take 1 tablet (0.6 mg total) by mouth daily. Start taking on: May 29, 2024   esomeprazole  40 MG capsule Commonly known as: NEXIUM  Take 1 capsule (40 mg total) by mouth daily.   fluticasone -salmeterol 250-50 MCG/ACT Aepb Commonly known as: ADVAIR INHALE 1 PUFF TWICE A DAY AS DIRECTED **RINSE MOUTH AFTER USE** (MORNING AND AT BEDTIME)   hydrochlorothiazide  25 MG tablet Commonly known as: HYDRODIURIL  Take 0.5 tablets (12.5 mg total) by mouth daily.   metoprolol  tartrate 50 MG tablet Commonly known as: LOPRESSOR  Take 1 tablet (50 mg total) by mouth 2 (two) times daily.   rosuvastatin  10 MG tablet Commonly known as: CRESTOR  Take 1 tablet (10 mg total) by mouth daily.   sertraline  25 MG tablet Commonly known as: ZOLOFT  Take 1 1/2 tablet per day. What changed:  how much to take how to take this when to take this        Follow-up Information     Glendia Shad, MD. Schedule an appointment as soon as possible for a visit in 1 week(s).   Specialty: Internal Medicine Contact information: [address] [place] [address] [phone]         Perla Evalene PARAS, MD. Schedule an appointment as soon as possible for a visit in 1 week(s).   Specialty: Cardiology Contact information: [address] [place] [address] [phone]                Discharge Exam: Fredricka Weights   05/26/24 1909  Weight: 97.1 kg   General.  Obese elderly lady, in no acute distress. Pulmonary.  Lungs clear bilaterally, normal respiratory effort. CV.  Regular rate and rhythm, no JVD, rub or murmur. Abdomen.  Soft, nontender, nondistended, BS positive. CNS.  Alert and oriented .  No focal neurologic deficit. Extremities.  No edema,   pulses intact and symmetrical. Psychiatry.  Judgment and insight appears normal.   Condition at discharge: stable  The results of significant diagnostics from this hospitalization (including imaging, microbiology, ancillary and laboratory) are listed below for reference.   Imaging Studies: ECHOCARDIOGRAM COMPLETE Result Date: 05/27/2024    ECHOCARDIOGRAM REPORT   Patient Name:   AKYA FIORELLO [facility] Date of Exam: 05/27/2024 Medical Rec #:  [id number]      Height:       67.0 in Accession #:    [id number]     Weight:       214.0 lb Date of Birth:  07/28/1945     BSA:          2.081 m Patient Age:    78 years       BP:           124/64 mmHg Patient Gender: F              HR:           88 bpm. Exam Location:  [facility] Procedure: 2D Echo, Cardiac Doppler and Color Doppler (Both Spectral and Color            Flow Doppler were utilized during procedure). STAT ECHO Indications:     Pericardial Effusion I31.3  History:         Patient has prior history of Echocardiogram examinations, most                  recent 11/21/2019. Risk Factors:Hypertension and Dyslipidemia.                  Anxiety.  Sonographer:     Christopher Furnace Referring Phys:  [id number] ROLLO JONELLE LOUDER Diagnosing Phys: Lonni Hanson MD IMPRESSIONS  1. Left ventricular ejection fraction, by estimation, is 60 to 65%. The left ventricle has normal function. Left ventricular endocardial border not optimally defined to evaluate regional wall motion. There is mild left ventricular hypertrophy. Left ventricular diastolic function could not be evaluated.  2. Right ventricular systolic function is normal. The right ventricular size is normal. There is normal pulmonary artery systolic pressure.  3. A small pericardial effusion is present. The pericardial effusion is circumferential. There is no evidence of cardiac tamponade.  4. The mitral valve is degenerative. Trivial mitral valve regurgitation. No evidence of mitral stenosis.  5. The aortic valve was not well visualized.  Aortic valve regurgitation is not visualized. No aortic stenosis is present.  6. The inferior vena cava is normal in size with greater than 50% respiratory variability, suggesting right atrial pressure of 3 mmHg. FINDINGS  Left Ventricle: Left ventricular ejection fraction, by estimation, is 60 to 65%. The left ventricle has normal function. Left ventricular endocardial border not optimally defined to evaluate regional wall motion. The left ventricular internal cavity size was normal in size. There is mild left ventricular hypertrophy. Left ventricular diastolic function could not be evaluated due to atrial fibrillation. Left ventricular diastolic function could not be evaluated. Right Ventricle: The right ventricular size is normal. No increase in right ventricular wall thickness. Right ventricular systolic function is normal. There is normal pulmonary artery systolic pressure. The tricuspid regurgitant velocity is 2.45 m/s, and  with an assumed right atrial pressure of 3 mmHg, the estimated right ventricular systolic pressure is 27.1 mmHg. Left Atrium: Left atrial size was normal in size. Right Atrium: Right atrial size was normal in size. Pericardium: A small pericardial effusion is present. The pericardial effusion is circumferential. There is no evidence of cardiac tamponade. Presence of epicardial fat layer. Mitral Valve: The mitral valve is degenerative in appearance. There is mild thickening of the mitral valve leaflet(s). There is mild calcification of the mitral valve leaflet(s). Trivial mitral valve regurgitation. No evidence of mitral valve stenosis. Tricuspid Valve: The tricuspid valve is not well visualized. Tricuspid valve regurgitation is mild. Aortic Valve: The aortic valve was not well visualized. Aortic valve regurgitation is not visualized. No aortic stenosis is present. Aortic valve mean gradient measures 2.0 mmHg. Aortic valve peak gradient measures 4.2 mmHg. Aortic valve area, by VTI measures  4.98 cm. Pulmonic Valve: The pulmonic valve was not well visualized. Pulmonic valve regurgitation is not visualized. No evidence of pulmonic stenosis. Aorta: The aortic root is normal in size and structure. Pulmonary Artery: The pulmonary artery is not well seen. Venous: The inferior vena cava is normal in size with greater than 50% respiratory variability, suggesting right atrial pressure of 3 mmHg. IAS/Shunts: No atrial level shunt detected by color flow Doppler.  LEFT VENTRICLE PLAX 2D  LVIDd:         4.60 cm LVIDs:         2.80 cm LV PW:         1.20 cm LV IVS:        1.10 cm LVOT diam:     2.20 cm LV SV:         68 LV SV Index:   33 LVOT Area:     3.80 cm LV IVRT:       90 msec  RIGHT VENTRICLE RV Basal diam:  3.10 cm RV Mid diam:    3.00 cm RV S prime:     15.60 cm/s TAPSE (M-mode): 1.4 cm LEFT ATRIUM             Index        RIGHT ATRIUM           Index LA diam:        3.00 cm 1.44 cm/m   RA Area:     17.70 cm LA Vol (A2C):   25.1 ml 12.06 ml/m  RA Volume:   45.50 ml  21.86 ml/m LA Vol (A4C):   30.3 ml 14.56 ml/m LA Biplane Vol: 27.4 ml 13.17 ml/m  AORTIC VALVE AV Area (Vmax):    3.88 cm AV Area (Vmean):   3.77 cm AV Area (VTI):     4.98 cm AV Vmax:           103.00 cm/s AV Vmean:          67.900 cm/s AV VTI:            0.136 m AV Peak Grad:      4.2 mmHg AV Mean Grad:      2.0 mmHg LVOT Vmax:         105.00 cm/s LVOT Vmean:        67.300 cm/s LVOT VTI:          0.178 m LVOT/AV VTI ratio: 1.31  AORTA Ao Root diam: 3.00 cm MITRAL VALVE                TRICUSPID VALVE MV Area (PHT): 4.33 cm     TR Peak grad:   24.1 mmHg MV Decel Time: 175 msec     TR Vmax:        245.44 cm/s MV E velocity: 112.00 cm/s                             SHUNTS                             Systemic VTI:  0.18 m                             Systemic Diam: 2.20 cm Lonni Hanson MD Electronically signed by Lonni Hanson MD Signature Date/Time: 05/27/2024/11:49:43 AM    Final    DG Chest 2 View Result Date: 05/26/2024 CLINICAL  DATA:  Shortness of breath and cough EXAM: CHEST - 2 VIEW COMPARISON:  05/12/2024, CT 05/22/2024 FINDINGS: Cardiomegaly. Small pleural effusions. No focal consolidation or pneumothorax IMPRESSION: Cardiomegaly with small pleural effusions. Electronically Signed   By: Luke Bun M.D.   On: 05/26/2024 22:11   CT Chest Wo Contrast Result Date: 05/26/2024 CLINICAL DATA:  Chest pain, abnormal chest radiograph. EXAM: CT CHEST WITHOUT CONTRAST TECHNIQUE:  Multidetector CT imaging of the chest was performed following the standard protocol without IV contrast. RADIATION DOSE REDUCTION: This exam was performed according to the departmental dose-optimization program which includes automated exposure control, adjustment of the mA and/or kV according to patient size and/or use of iterative reconstruction technique. COMPARISON:  Chest radiograph 05/12/2024. FINDINGS: Cardiovascular: Atherosclerotic calcification of the aorta. Heart is enlarged. Large pericardial effusion. Mediastinum/Nodes: No pathologically enlarged mediastinal or axillary lymph nodes. Hilar regions are difficult to definitively evaluate without IV contrast. Air in the esophagus can be seen with dysmotility. Lungs/Pleura: Mild biapical pleuroparenchymal scarring. Calcified granulomas. 5 pulmonary nodules measure 5 mm or less in size. Mid and lower lung zone subsegmental volume loss and scarring. Small bilateral pleural effusions with compressive atelectasis in both lower lobes. Airway is unremarkable. Upper Abdomen: Low-attenuation lesions in the liver. No specific follow-up necessary. Liver may be mildly heterogeneous in attenuation. Gallstones. Low-attenuation thickening of the right adrenal gland. No specific follow-up necessary. Small hiatal hernia. Visualized portions of the liver, gallbladder, adrenal glands, right kidney, spleen, stomach and bowel are otherwise grossly unremarkable. No upper abdominal adenopathy. Musculoskeletal: Degenerative changes  in the spine. IMPRESSION: 1. Pulmonary nodules measure 5 mm or less in size. No follow-up needed if patient is low-risk (and has no known or suspected primary neoplasm). Non-contrast chest CT can be considered in 12 months if patient is high-risk. This recommendation follows the consensus statement: Guidelines for Management of Incidental Pulmonary Nodules Detected on CT Images: From the Fleischner Society 2017; Radiology 2017; 284:228-243. 2. Large pericardial effusion. 3. Small bilateral pleural effusions with compressive atelectasis in both lower lobes. 4. Liver may be mildly steatotic. 5. Cholelithiasis. 6.  Aortic atherosclerosis (ICD10-I70.0). Electronically Signed   By: Newell Eke M.D.   On: 05/26/2024 15:41   DG Shoulder Left Result Date: 05/12/2024 EXAM: 1 VIEW(S) XRAY OF THE LEFT SHOULDER 05/12/2024 04:09:05 PM COMPARISON: None available. CLINICAL HISTORY: left shoulder pian. increased pain. FINDINGS: BONES AND JOINTS: Degenerative changes are present in the left shoulder. Mild glenohumeral joint space narrowing. Mild acromioclavicular joint spurring. Glenohumeral joint is normally aligned. No acute fracture. No malalignment. SOFT TISSUES: No abnormal calcifications. Linear atelectasis or fibrosis in the left lung base. IMPRESSION: 1. Mild glenohumeral joint space narrowing and mild acromioclavicular joint spurring, consistent with degenerative changes in the left shoulder. Electronically signed by: Elsie Gravely MD 05/12/2024 05:03 PM EST RP Workstation: [id number]   DG Chest 2 View Result Date: 05/12/2024 EXAM: 2 VIEW(S) XRAY OF THE CHEST 05/12/2024 04:09:05 PM COMPARISON: CT abdomen and pelvis 10/20/2019 and chest x ray 04/13/2023. CLINICAL HISTORY: cough, upper left shoulder pain FINDINGS: LUNGS AND PLEURA: On the lateral view, a nodular density projects over the mid thoracic spine measuring 15 mm which is indeterminate. No other focal lung infiltrate. No pleural effusion. No pneumothorax.  HEART AND MEDIASTINUM: Moderate sized hiatal hernia is again seen. No acute abnormality of the cardiac and mediastinal silhouettes. BONES AND SOFT TISSUES: Facet arthropathy seen on recent CT comparison. No acute osseous abnormality. IMPRESSION: 1. No acute cardiopulmonary abnormality. 2. Indeterminate 15 mm nodular density on the lateral view projecting over the mid thoracic spine, which may correspond to facet arthropathy seen on prior CT. 3. Moderate sized hiatal hernia. Electronically signed by: Greig Pique MD 05/12/2024 05:03 PM EST RP Workstation: [id number]    Microbiology: Results for orders placed or performed in visit on 07/20/23  Microscopic Examination     Status: Abnormal   Collection Time: 07/20/23 11:07 AM   Urine  Result Value Ref Range Status   WBC, UA 6-10 (A) 0 - 5 /hpf Final   RBC, Urine None seen 0 - 2 /hpf Final   Epithelial Cells (non renal) 0-10 0 - 10 /hpf Final   Bacteria, UA Few None seen/Few Final    Labs: CBC: Recent Labs  Lab 05/26/24 1912 05/28/24 0740  WBC 14.8* 8.9  NEUTROABS 11.9*  --   HGB 10.8* 9.6*  HCT 32.7* 29.8*  MCV 87.2 89.0  PLT 423* 368   Basic Metabolic Panel: Recent Labs  Lab 05/26/24 1912 05/26/24 2055 05/28/24 0740  NA 135  --  139  K 4.3  --  4.2  CL 99  --  104  CO2 23  --  26  GLUCOSE 154*  --  106*  BUN 27*  --  16  CREATININE 1.37*  --  1.00  CALCIUM  9.4  --  9.5  MG  --  2.0  --    Liver Function Tests: No results for input(s): AST, ALT, ALKPHOS, BILITOT, PROT, ALBUMIN  in the last 168 hours. CBG: No results for input(s): GLUCAP in the last 168 hours.  Discharge time spent: greater than 30 minutes.  This record has been created using Conservation officer, historic buildings. Errors have been sought and corrected,but may not always be located. Such creation errors do not reflect on the standard of care.   Signed: Amaryllis Dare, MD [facility] 05/28/2024 "

## 2024-05-28 NOTE — Plan of Care (Signed)

## 2024-05-29 ENCOUNTER — Telehealth: Payer: Self-pay

## 2024-05-29 NOTE — Transitions of Care (Post Inpatient/ED Visit) (Signed)
 "  05/29/2024  Name: Susan Miller MRN: [id number] DOB: [birth year]  Today's TOC FU Call Status: Today's TOC FU Call Status:: Successful TOC FU Call Completed TOC FU Call Complete Date: 05/29/24  Patient's Name and Date of Birth confirmed. Name, DOB  Transition Care Management Follow-up Telephone Call Date of Discharge: 05/28/24 Discharge Facility: [facility]) Type of Discharge: Inpatient Admission Primary Inpatient Discharge Diagnosis:: UTI How have you been since you were released from the hospital?: Better Any questions or concerns?: No  Items Reviewed: Did you receive and understand the discharge instructions provided?: Yes Medications obtained,verified, and reconciled?: Yes (Medications Reviewed) Any new allergies since your discharge?: No Dietary orders reviewed?: Yes Do you have support at home?: Yes People in Home [RPT]: child(ren), adult  Medications Reviewed Today: Medications Reviewed Today     Reviewed by Emmitt Pan, LPN (Licensed Practical Nurse) on 05/29/24 at 1152  Med List Status: <None>   Medication Order Taking? Sig Documenting Provider Last Dose Status Informant  acetaminophen  (TYLENOL ) 500 MG tablet [id number] Yes Take 500 mg by mouth every 6 (six) hours as needed for mild pain (pain score 1-3). [provider]  Active Self  albuterol  (PROVENTIL ) (2.5 MG/3ML) 0.083% nebulizer solution [id number] Yes Take 3 mLs (2.5 mg total) by nebulization every 4 (four) hours as needed for wheezing or shortness of breath. Tamea Dedra CROME, MD  Active Self           Med Note VELDON VELERIA BRAVO   Sat Oct 20, 2023 12:46 PM) PRN  albuterol  (VENTOLIN  HFA) 108 (90 Base) MCG/ACT inhaler [id number] Yes Inhale 2 puffs into the lungs every 6 (six) hours as needed for wheezing or shortness of breath. Tamea Dedra CROME, MD  Active   apixaban  (ELIQUIS ) 5 MG TABS tablet [id number] Yes Take 1 tablet (5 mg total) by mouth 2 (two) times daily. Caleen Qualia, MD  Active    colchicine  0.6 MG tablet [id number] Yes Take 1 tablet (0.6 mg total) by mouth daily. Amin, Sumayya, MD  Active   esomeprazole  (NEXIUM ) 40 MG capsule [id number] Yes Take 1 capsule (40 mg total) by mouth daily. Glendia Shad, MD  Active   fluticasone -salmeterol (ADVAIR) 250-50 MCG/ACT AEPB [id number] Yes INHALE 1 PUFF TWICE A DAY AS DIRECTED **RINSE MOUTH AFTER USE** (MORNING AND AT BEDTIME) Tamea Dedra CROME, MD  Active   hydrochlorothiazide  (HYDRODIURIL ) 25 MG tablet [id number] Yes Take 0.5 tablets (12.5 mg total) by mouth daily. Glendia Shad, MD  Active   losartan  (COZAAR ) 25 MG tablet [id number]  TAKE 1 TABLET(25 MG) BY MOUTH DAILY  Patient not taking: Reported on 05/29/2024   Glendia Shad, MD  Active   metoprolol  tartrate (LOPRESSOR ) 50 MG tablet [id number] Yes Take 1 tablet (50 mg total) by mouth 2 (two) times daily. Amin, Sumayya, MD  Active   rosuvastatin  (CRESTOR ) 10 MG tablet [id number] Yes Take 1 tablet (10 mg total) by mouth daily. Glendia Shad, MD  Active   sertraline  (ZOLOFT ) 25 MG tablet [id number] Yes Take 1 1/2 tablet per day. Glendia Shad, MD  Active             Home Care and Equipment/Supplies: Were Home Health Services Ordered?: NA Any new equipment or medical supplies ordered?: NA  Functional Questionnaire: Do you need assistance with bathing/showering or dressing?: No Do you need assistance with meal preparation?: No Do you need assistance with eating?: No Do you have difficulty maintaining continence: No Do you need assistance with getting out of bed/getting out of  a chair/moving?: No Do you have difficulty managing or taking your medications?: No  Follow up appointments reviewed: PCP Follow-up appointment confirmed?: No (sent message to staff to schedule) MD Provider Line Number:[phone] Given: No Specialist Hospital Follow-up appointment confirmed?: NA Do you need transportation to your follow-up appointment?: No Do you understand care options if  your condition(s) worsen?: Yes-patient verbalized understanding    SIGNATURE Julian Lemmings, LPN [facility] Nurse Health Advisor Direct Dial [phone]  "

## 2024-05-29 NOTE — Telephone Encounter (Signed)
 Appt made. Pt in agreement with coming in

## 2024-05-29 NOTE — Telephone Encounter (Signed)
 Please call - - ok to schedule 06/05/24 - 8:00.

## 2024-06-02 ENCOUNTER — Ambulatory Visit: Admitting: Pulmonary Disease

## 2024-06-05 ENCOUNTER — Ambulatory Visit: Admitting: Internal Medicine

## 2024-06-05 ENCOUNTER — Ambulatory Visit: Payer: Self-pay | Admitting: Internal Medicine

## 2024-06-05 ENCOUNTER — Encounter: Payer: Self-pay | Admitting: Internal Medicine

## 2024-06-05 VITALS — BP 130/74 | HR 71 | Temp 97.6°F | Ht 67.0 in | Wt 207.0 lb

## 2024-06-05 DIAGNOSIS — E7800 Pure hypercholesterolemia, unspecified: Secondary | ICD-10-CM

## 2024-06-05 DIAGNOSIS — I480 Paroxysmal atrial fibrillation: Secondary | ICD-10-CM

## 2024-06-05 DIAGNOSIS — D509 Iron deficiency anemia, unspecified: Secondary | ICD-10-CM

## 2024-06-05 DIAGNOSIS — I319 Disease of pericardium, unspecified: Secondary | ICD-10-CM

## 2024-06-05 DIAGNOSIS — M25512 Pain in left shoulder: Secondary | ICD-10-CM

## 2024-06-05 DIAGNOSIS — D649 Anemia, unspecified: Secondary | ICD-10-CM

## 2024-06-05 DIAGNOSIS — K219 Gastro-esophageal reflux disease without esophagitis: Secondary | ICD-10-CM

## 2024-06-05 DIAGNOSIS — I10 Essential (primary) hypertension: Secondary | ICD-10-CM

## 2024-06-05 DIAGNOSIS — J683 Other acute and subacute respiratory conditions due to chemicals, gases, fumes and vapors: Secondary | ICD-10-CM

## 2024-06-05 DIAGNOSIS — I3139 Other pericardial effusion (noninflammatory): Secondary | ICD-10-CM

## 2024-06-05 DIAGNOSIS — R739 Hyperglycemia, unspecified: Secondary | ICD-10-CM

## 2024-06-05 LAB — BASIC METABOLIC PANEL WITH GFR
BUN: 15 mg/dL (ref 6–23)
CO2: 30 meq/L (ref 19–32)
Calcium: 9 mg/dL (ref 8.4–10.5)
Chloride: 103 meq/L (ref 96–112)
Creatinine, Ser: 1.01 mg/dL (ref 0.40–1.20)
GFR: 53.42 mL/min — ABNORMAL LOW
Glucose, Bld: 118 mg/dL — ABNORMAL HIGH (ref 70–99)
Potassium: 4.1 meq/L (ref 3.5–5.1)
Sodium: 140 meq/L (ref 135–145)

## 2024-06-05 LAB — CBC WITH DIFFERENTIAL/PLATELET
Basophils Absolute: 0 10*3/uL (ref 0.0–0.1)
Basophils Relative: 0.4 % (ref 0.0–3.0)
Eosinophils Absolute: 0.2 10*3/uL (ref 0.0–0.7)
Eosinophils Relative: 1.7 % (ref 0.0–5.0)
HCT: 31.4 % — ABNORMAL LOW (ref 36.0–46.0)
Hemoglobin: 10.4 g/dL — ABNORMAL LOW (ref 12.0–15.0)
Lymphocytes Relative: 22.4 % (ref 12.0–46.0)
Lymphs Abs: 2.2 10*3/uL (ref 0.7–4.0)
MCHC: 33.1 g/dL (ref 30.0–36.0)
MCV: 87.1 fl (ref 78.0–100.0)
Monocytes Absolute: 0.8 10*3/uL (ref 0.1–1.0)
Monocytes Relative: 8.2 % (ref 3.0–12.0)
Neutro Abs: 6.7 10*3/uL (ref 1.4–7.7)
Neutrophils Relative %: 67.3 % (ref 43.0–77.0)
Platelets: 432 10*3/uL — ABNORMAL HIGH (ref 150.0–400.0)
RBC: 3.6 Mil/uL — ABNORMAL LOW (ref 3.87–5.11)
RDW: 13.9 % (ref 11.5–15.5)
WBC: 10 10*3/uL (ref 4.0–10.5)

## 2024-06-05 LAB — FERRITIN: Ferritin: 103 ng/mL (ref 10.0–291.0)

## 2024-06-05 MED ORDER — SERTRALINE HCL 25 MG PO TABS: ORAL_TABLET | ORAL | 1 refills | Status: AC

## 2024-06-06 NOTE — Telephone Encounter (Unsigned)
 Copied from CRM #[id number]. Topic: Clinical - Lab/Test Results >> Jun 06, 2024 12:19 PM Alexandria E wrote: Reason for CRM: Patient returning call for labs, relayed note that PCP attached and she did not have any further questions.

## 2024-06-07 ENCOUNTER — Encounter: Payer: Self-pay | Admitting: Internal Medicine

## 2024-06-07 DIAGNOSIS — I319 Disease of pericardium, unspecified: Secondary | ICD-10-CM | POA: Insufficient documentation

## 2024-06-07 NOTE — Assessment & Plan Note (Signed)
 Noted to have pericardial effusion on pre ED CT scan as outlined.  Echocardiogram revealed LVEF of 60-65% with mild LVH. Normal RV size and function noted. Small pericardial effusion was present without evidence of tamponade physiology. IVC was normal in size with respiratory variation consistent with normal CVP and arguing against tamponade. Was started on cardizem  and heparin  infusion. ESR 79 and CRP 20.2 -  started on colchicine  for concern regarding pericarditis.

## 2024-06-07 NOTE — Assessment & Plan Note (Signed)
 On hydrochlorothiazide  12.5mg  q day. Off losartan . Blood pressure as outlined. Follow pressures. Remain off losartan . Now on metoprolol . No change in medication. Check metabolic panel to confirm continued improvement in renal function.

## 2024-06-07 NOTE — Assessment & Plan Note (Signed)
 Seeing pulmonary - f/u mild reactive airways disease.  Recommended (per note) - inhaler changed to Breyna . Still with persistent cough. Discussed f/u with pulmonary for further evaluation.

## 2024-06-07 NOTE — Assessment & Plan Note (Signed)
No upper symptoms reported.  Continue nexium.  

## 2024-06-07 NOTE — Assessment & Plan Note (Signed)
 Saw Dr Jacobo - 09/05/22 - anemia.  Suspect IDA. Colonoscopy and EGD in October 2023 did not reveal any significant pathology. Patient could not tolerate video endoscopy. S/p IV venofer .  Last check - hgb and iron  studies wnl. Recommended f/u. Continue to follow cbc and iron  studies.

## 2024-06-07 NOTE — Assessment & Plan Note (Signed)
 Overall improved. Has f/u with ortho tomorrow.

## 2024-06-07 NOTE — Assessment & Plan Note (Signed)
 Recent admission - found to be in afib. Appears to be in SR today. Cardiology evaluated - started her on metoprolol  50mg  bid and eliquis . Plan for zio monitor (outpatient). Recommended f/u - afib clinic. Continue metoprolol .

## 2024-06-07 NOTE — Assessment & Plan Note (Signed)
 Low-carb diet and exercise.  Follow met b and A1c.

## 2024-06-09 ENCOUNTER — Ambulatory Visit: Payer: Self-pay

## 2024-06-10 ENCOUNTER — Ambulatory Visit: Admitting: Internal Medicine

## 2024-06-10 NOTE — Telephone Encounter (Signed)
 Pt notified. Pt stated she would cont with Tylenol . Pt will reach out to ortho for further instructions

## 2024-06-10 NOTE — Telephone Encounter (Signed)
 Spoke to pt she stated that she is feeling better today she stated that she went to [facility] they did scan she said they concluded that it was her neck and not her shoulder she would like to get an MRI done soon to see exactly what is happening

## 2024-06-12 ENCOUNTER — Other Ambulatory Visit: Payer: Self-pay | Admitting: Orthopedic Surgery

## 2024-06-12 DIAGNOSIS — M4802 Spinal stenosis, cervical region: Secondary | ICD-10-CM

## 2024-06-13 ENCOUNTER — Encounter: Payer: Self-pay | Admitting: Pulmonary Disease

## 2024-06-13 ENCOUNTER — Ambulatory Visit: Admitting: Pulmonary Disease

## 2024-06-13 MED ORDER — PREDNISONE 20 MG PO TABS: 20.0000 mg | ORAL_TABLET | Freq: Every day | ORAL | 0 refills | Status: AC

## 2024-06-13 NOTE — Patient Instructions (Signed)
 VISIT SUMMARY:  During your visit, we discussed your respiratory issues, weakness, and other symptoms. We reviewed your history of atrial fibrillation, cough variant asthma, and recent shoulder pain. We also addressed your concerns about esophageal irritation and gastrointestinal distress.  YOUR PLAN:  -COUGH VARIANT ASTHMA: Cough variant asthma is a type of asthma where the main symptom is a dry, non-productive cough. You should continue using your Advair inhaler for lung management and rinse your mouth with a sodium bicarbonate solution after using the inhaler to prevent thrush.  -PERICARDIAL EFFUSION: Pericardial effusion is the buildup of fluid around the heart, which can be related to atrial fibrillation and pressure changes. Ensure you receive and use the Zio patch monitoring device to keep track of your cardiac rhythm.  -ESOPHAGEAL IRRITATION: Esophageal irritation can cause difficulty swallowing and contribute to coughing. It may be exacerbated by medications like colchicine . We have discontinued colchicine  and started a short course of prednisone . Continue taking Nexium  once daily to help with the irritation.  INSTRUCTIONS:  Please follow up with your cardiologist next week as planned. Ensure you receive the Zio patch monitoring device and use it to monitor your heart rhythm. Continue using your Advair inhaler and rinse your mouth with a sodium bicarbonate solution after each use. Take Nexium  once daily for esophageal irritation and follow the short course of prednisone  as prescribed.

## 2024-06-13 NOTE — Progress Notes (Unsigned)
 "  Subjective:    [name], female    DOB: [birth year], 79 y.o.   MRN: [id number]  Patient Care Team: Glendia Shad, MD as PCP - General (Internal Medicine) Darliss Rogue, MD as PCP - Cardiology (Cardiology) Geronimo Manuelita SAUNDERS, RPH-CPP as Pharmacist (Pharmacist) Malachy Comer GAILS, NP as Nurse Practitioner (Nurse Practitioner) Tamea Dedra CROME, MD as Consulting Physician (Pulmonary Disease)  Chief Complaint  Patient presents with   Asthma    Shortness of breath on exertion and at rest. Cough with clear phlegm. Reports using Advair daily and Albuterol  twice daily. Reports she was not able to use Spiriva .    Hospitalization Follow-up    Admitted 01/19 to 01/21 due to A-fib, Dyspnea on exertion and  Pericardial effusion.     BACKGROUND/INTERVAL:  HPI   Review of Systems A 10 point review of systems was performed and it is as noted above otherwise negative.   Patient Active Problem List   Diagnosis Date Noted   Pericarditis 06/07/2024   Atrial fibrillation (HCC) 05/27/2024   Pericardial effusion 05/27/2024   Left shoulder pain 04/18/2024   Cough variant asthma 04/02/2024   History of small bowel obstruction 11/10/2023   Anemia 11/07/2023   Small bowel obstruction (HCC) 10/20/2023   Breast cancer screening 09/02/2023   Post-viral cough syndrome 06/28/2023   Asthmatic bronchitis 04/29/2023   Rash 12/23/2022   Reactive airways dysfunction syndrome (HCC) 12/23/2022   Foot pain 12/23/2022   Allergic rhinitis 11/14/2022   Mild reactive airways disease 10/11/2022   Polyp of ascending colon    Knee pain, right 12/13/2021   Major depressive disorder, single episode, mild 12/11/2021   Iron  deficiency anemia, unspecified 04/10/2021   Aortic atherosclerosis 04/10/2021   UTI (urinary tract infection) 03/27/2021   Cutaneous skin tags 12/12/2020   B12 deficiency 12/12/2020   OSA (obstructive sleep apnea) 04/18/2020   Numbness and  tingling of both feet 02/20/2020   Daytime hypersomnolence 01/29/2020   Dysuria 12/07/2019   Snoring 12/07/2019   Hyperlipidemia 10/09/2019   Back pain 11/15/2018   Dyspnea on exertion 11/15/2018   Decreased GFR 11/14/2018   Hyperbilirubinemia 11/14/2018   Abdominal bloating 12/09/2017   Osteopenia 04/29/2017   Hyperglycemia 04/27/2017   Reflux esophagitis    Heartburn    Special screening for malignant neoplasms, colon    Hair loss 10/26/2015   Fatigue 10/21/2015   Vitamin D  deficiency 03/15/2015   Weight loss counseling, encounter for 02/22/2015   Facial erythema 07/27/2014   Health care maintenance 07/05/2014   BP (high blood pressure) 03/26/2014   Headache 03/08/2014   Eyelid lesion 03/08/2014   Light headedness 06/22/2013   Cough 05/18/2013   GERD (gastroesophageal reflux disease) 05/18/2013   Swelling of extremity 03/01/2013   Left knee pain 03/01/2013   Essential hypertension, benign 12/15/2012   Frequent UTI 12/15/2012   Mild depression 12/15/2012   Environmental allergies 12/15/2012    Social History   Tobacco Use   Smoking status: Never   Smokeless tobacco: Never  Substance Use Topics   Alcohol use: No    Alcohol/week: 0.0 standard drinks of alcohol    Allergies[1]  Active Medications[2]  Immunization History  Administered Date(s) Administered    sv, Bivalent, Protein Subunit Rsvpref,pf [place]) 02/17/2022   Fluad Quad(high Dose 65+) 01/31/2019, 01/29/2020, 01/25/2022   INFLUENZA, HIGH DOSE SEASONAL PF 01/11/2016, 04/27/2017, 01/18/2018, 01/28/2024   Influenza,inj,Quad PF,6+ Mos 02/24/2013, 02/27/2014, 02/22/2015   Moderna Covid-19 Fall Seasonal Vaccine 18yrs & older 02/17/2022  PFIZER(Purple Top)SARS-COV-2 Vaccination 06/23/2019, 07/14/2019, 01/06/2022   Pneumococcal Conjugate-13 11/06/2016   Pneumococcal Polysaccharide-23 12/06/2017   Pneumococcal-Unspecified 11/06/2016   RSV,unspecified 01/06/2022         Objective:     Vitals:   06/13/24 1054  BP: 118/78  Pulse: 72  Temp: 97.9 F (36.6 C)  Height: 5' 7 (1.702 m)  Weight: 205 lb (93 kg)  SpO2: 96%  TempSrc: Temporal  BMI (Calculated): 32.1     SpO2: 96 %  GENERAL: HEAD: Normocephalic, atraumatic.  EYES: Pupils equal, round, reactive to light.  No scleral icterus.  MOUTH:  NECK: Supple. No thyromegaly. Trachea midline. No JVD.  No adenopathy. PULMONARY: Good air entry bilaterally.  No adventitious sounds. CARDIOVASCULAR: S1 and S2. Regular rate and rhythm.  ABDOMEN: MUSCULOSKELETAL: No joint deformity, no clubbing, no edema.  NEUROLOGIC:  SKIN: Intact,warm,dry. PSYCH:        Assessment & Plan:   No diagnosis found.  No orders of the defined types were placed in this encounter.   No orders of the defined types were placed in this encounter.     Advised if symptoms do not improve or worsen, to please contact office for sooner follow up or seek emergency care.    I spent xxx minutes of dedicated to the care of this patient on the date of this encounter to include pre-visit review of records, face-to-face time with the patient discussing conditions above, post visit ordering of testing, clinical documentation with the electronic health record, making appropriate referrals as documented, and communicating necessary findings to members of the patients care team.     C. Leita Sanders, MD Advanced Bronchoscopy P[facility]    *This note was generated using voice recognition software/Dragon and/or AI transcription program.  Despite best efforts to proofread, errors can occur which can change the meaning. Any transcriptional errors that result from this process are unintentional and may not be fully corrected at the time of dictation.     [1] Allergies Allergen Reactions   Flexeril  [Cyclobenzaprine ] Other (See Comments)    Sedation and visual disturbances   Codeine Hives    Iodinated Contrast Media Swelling  [2] Current Meds  Medication Sig   acetaminophen  (TYLENOL ) 500 MG tablet Take 500 mg by mouth every 6 (six) hours as needed for mild pain (pain score 1-3).   albuterol  (PROVENTIL ) (2.5 MG/3ML) 0.083% nebulizer solution Take 3 mLs (2.5 mg total) by nebulization every 4 (four) hours as needed for wheezing or shortness of breath.   albuterol  (VENTOLIN  HFA) 108 (90 Base) MCG/ACT inhaler Inhale 2 puffs into the lungs every 6 (six) hours as needed for wheezing or shortness of breath.   apixaban  (ELIQUIS ) 5 MG TABS tablet Take 1 tablet (5 mg total) by mouth 2 (two) times daily.   colchicine  0.6 MG tablet Take 1 tablet (0.6 mg total) by mouth daily.   esomeprazole  (NEXIUM ) 40 MG capsule Take 1 capsule (40 mg total) by mouth daily.   fluticasone -salmeterol (ADVAIR) 250-50 MCG/ACT AEPB INHALE 1 PUFF TWICE A DAY AS DIRECTED **RINSE MOUTH AFTER USE** (MORNING AND AT BEDTIME)   hydrochlorothiazide  (HYDRODIURIL ) 25 MG tablet Take 0.5 tablets (12.5 mg total) by mouth daily.   metoprolol  tartrate (LOPRESSOR ) 50 MG tablet Take 1 tablet (50 mg total) by mouth 2 (two) times daily.   rosuvastatin  (CRESTOR ) 10 MG tablet Take 1 tablet (10 mg total) by mouth daily.   sertraline  (ZOLOFT ) 25 MG tablet Take 1 tablet per day.  "

## 2024-06-15 ENCOUNTER — Ambulatory Visit: Admission: RE | Admit: 2024-06-15

## 2024-06-17 ENCOUNTER — Ambulatory Visit: Admitting: Physician Assistant

## 2024-06-24 ENCOUNTER — Other Ambulatory Visit

## 2024-06-26 ENCOUNTER — Ambulatory Visit: Admitting: Internal Medicine

## 2024-08-15 ENCOUNTER — Ambulatory Visit: Admitting: Pulmonary Disease

## 2025-01-19 ENCOUNTER — Ambulatory Visit
